# Patient Record
Sex: Female | Born: 1955
Health system: Southern US, Community
[De-identification: ages and names within clinical notes are randomized; demographics above are authoritative.]

## PROBLEM LIST (undated history)

## (undated) DIAGNOSIS — J449 Chronic obstructive pulmonary disease, unspecified: Secondary | ICD-10-CM

## (undated) DIAGNOSIS — E039 Hypothyroidism, unspecified: Secondary | ICD-10-CM

## (undated) DIAGNOSIS — E785 Hyperlipidemia, unspecified: Secondary | ICD-10-CM

## (undated) DIAGNOSIS — C50912 Malignant neoplasm of unspecified site of left female breast: Secondary | ICD-10-CM

## (undated) DIAGNOSIS — F329 Major depressive disorder, single episode, unspecified: Secondary | ICD-10-CM

## (undated) DIAGNOSIS — K219 Gastro-esophageal reflux disease without esophagitis: Secondary | ICD-10-CM

## (undated) DIAGNOSIS — Z801 Family history of malignant neoplasm of trachea, bronchus and lung: Secondary | ICD-10-CM

## (undated) DIAGNOSIS — I2089 Other forms of angina pectoris: Secondary | ICD-10-CM

## (undated) DIAGNOSIS — C801 Malignant (primary) neoplasm, unspecified: Secondary | ICD-10-CM

## (undated) DIAGNOSIS — Z800 Family history of malignant neoplasm of digestive organs: Secondary | ICD-10-CM

## (undated) DIAGNOSIS — Z803 Family history of malignant neoplasm of breast: Secondary | ICD-10-CM

## (undated) DIAGNOSIS — F32A Depression, unspecified: Secondary | ICD-10-CM

## (undated) DIAGNOSIS — I10 Essential (primary) hypertension: Secondary | ICD-10-CM

## (undated) DIAGNOSIS — Z8042 Family history of malignant neoplasm of prostate: Secondary | ICD-10-CM

## (undated) DIAGNOSIS — I208 Other forms of angina pectoris: Secondary | ICD-10-CM

## (undated) DIAGNOSIS — F419 Anxiety disorder, unspecified: Secondary | ICD-10-CM

## (undated) HISTORY — PX: ADENOIDECTOMY: SUR15

## (undated) HISTORY — DX: Family history of malignant neoplasm of trachea, bronchus and lung: Z80.1

## (undated) HISTORY — DX: Hypothyroidism, unspecified: E03.9

## (undated) HISTORY — DX: Malignant (primary) neoplasm, unspecified: C80.1

## (undated) HISTORY — DX: Family history of malignant neoplasm of prostate: Z80.42

## (undated) HISTORY — PX: CARDIAC CATHETERIZATION: SHX172

## (undated) HISTORY — PX: TONSILLECTOMY: SUR1361

## (undated) HISTORY — DX: Chronic obstructive pulmonary disease, unspecified: J44.9

## (undated) HISTORY — DX: Family history of malignant neoplasm of digestive organs: Z80.0

## (undated) HISTORY — DX: Family history of malignant neoplasm of breast: Z80.3

## (undated) HISTORY — DX: Hyperlipidemia, unspecified: E78.5

---
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
## 1998-01-21 DIAGNOSIS — C801 Malignant (primary) neoplasm, unspecified: Secondary | ICD-10-CM

## 1998-01-21 HISTORY — PX: MASTECTOMY, PARTIAL: SHX709

## 1998-01-21 HISTORY — DX: Malignant (primary) neoplasm, unspecified: C80.1

## 2006-08-04 ENCOUNTER — Ambulatory Visit: Payer: Self-pay | Admitting: Oncology

## 2006-09-08 LAB — CBC WITH DIFFERENTIAL/PLATELET
BASO%: 1.5 % (ref 0.0–2.0)
Basophils Absolute: 0.1 10*3/uL (ref 0.0–0.1)
EOS%: 1.8 % (ref 0.0–7.0)
Eosinophils Absolute: 0.1 10*3/uL (ref 0.0–0.5)
HCT: 39.7 % (ref 34.8–46.6)
HGB: 14.1 g/dL (ref 11.6–15.9)
LYMPH%: 30.8 % (ref 14.0–48.0)
MCH: 31.1 pg (ref 26.0–34.0)
MCHC: 35.4 g/dL (ref 32.0–36.0)
MCV: 87.9 fL (ref 81.0–101.0)
MONO#: 0.5 10*3/uL (ref 0.1–0.9)
MONO%: 7.6 % (ref 0.0–13.0)
NEUT#: 3.5 10*3/uL (ref 1.5–6.5)
NEUT%: 58.3 % (ref 39.6–76.8)
Platelets: 240 10*3/uL (ref 145–400)
RBC: 4.52 10*6/uL (ref 3.70–5.32)
RDW: 12 % (ref 11.3–14.5)
WBC: 5.9 10*3/uL (ref 3.9–10.0)
lymph#: 1.8 10*3/uL (ref 0.9–3.3)

## 2006-09-08 LAB — COMPREHENSIVE METABOLIC PANEL
ALT: 16 U/L (ref 0–35)
AST: 17 U/L (ref 0–37)
Albumin: 4.6 g/dL (ref 3.5–5.2)
Alkaline Phosphatase: 80 U/L (ref 39–117)
BUN: 15 mg/dL (ref 6–23)
CO2: 24 mEq/L (ref 19–32)
Calcium: 9.4 mg/dL (ref 8.4–10.5)
Chloride: 107 mEq/L (ref 96–112)
Creatinine, Ser: 0.82 mg/dL (ref 0.40–1.20)
Glucose, Bld: 82 mg/dL (ref 70–99)
Potassium: 4.3 mEq/L (ref 3.5–5.3)
Sodium: 142 mEq/L (ref 135–145)
Total Bilirubin: 0.4 mg/dL (ref 0.3–1.2)
Total Protein: 7.1 g/dL (ref 6.0–8.3)

## 2006-09-08 LAB — CANCER ANTIGEN 27.29: CA 27.29: 24 U/mL (ref 0–39)

## 2006-10-07 ENCOUNTER — Ambulatory Visit: Payer: Self-pay | Admitting: Oncology

## 2007-10-01 ENCOUNTER — Ambulatory Visit: Payer: Self-pay | Admitting: Oncology

## 2010-04-17 ENCOUNTER — Other Ambulatory Visit (HOSPITAL_COMMUNITY)
Admission: RE | Admit: 2010-04-17 | Discharge: 2010-04-17 | Disposition: A | Discharge status: Home / Self Care | Payer: BC Managed Care – PPO | Source: Ambulatory Visit | Attending: Family Medicine | Admitting: Family Medicine

## 2010-04-17 DIAGNOSIS — Z124 Encounter for screening for malignant neoplasm of cervix: Secondary | ICD-10-CM | POA: Insufficient documentation

## 2013-09-09 ENCOUNTER — Ambulatory Visit
Admission: RE | Admit: 2013-09-09 | Discharge: 2013-09-09 | Disposition: A | Discharge status: Home / Self Care | Payer: BC Managed Care – PPO | Source: Ambulatory Visit | Attending: Family Medicine | Admitting: Family Medicine

## 2013-09-09 ENCOUNTER — Other Ambulatory Visit: Payer: Self-pay | Admitting: Family Medicine

## 2013-09-09 DIAGNOSIS — R0602 Shortness of breath: Secondary | ICD-10-CM

## 2013-09-10 ENCOUNTER — Telehealth: Payer: Self-pay | Admitting: Cardiovascular Disease

## 2013-09-13 NOTE — Telephone Encounter (Signed)
Closed encounter °

## 2013-09-15 ENCOUNTER — Ambulatory Visit (INDEPENDENT_AMBULATORY_CARE_PROVIDER_SITE_OTHER): Payer: BC Managed Care – PPO | Admitting: Cardiovascular Disease

## 2013-09-15 ENCOUNTER — Encounter: Payer: Self-pay | Admitting: Cardiovascular Disease

## 2013-09-15 ENCOUNTER — Ambulatory Visit: Payer: Self-pay | Admitting: Cardiovascular Disease

## 2013-09-15 VITALS — BP 122/66 | HR 55 | Resp 16 | Ht 63.5 in | Wt 148.9 lb

## 2013-09-15 DIAGNOSIS — R0609 Other forms of dyspnea: Secondary | ICD-10-CM

## 2013-09-15 DIAGNOSIS — E7800 Pure hypercholesterolemia, unspecified: Secondary | ICD-10-CM | POA: Insufficient documentation

## 2013-09-15 DIAGNOSIS — R0989 Other specified symptoms and signs involving the circulatory and respiratory systems: Secondary | ICD-10-CM

## 2013-09-15 DIAGNOSIS — R0602 Shortness of breath: Secondary | ICD-10-CM

## 2013-09-15 DIAGNOSIS — Z853 Personal history of malignant neoplasm of breast: Secondary | ICD-10-CM

## 2013-09-15 DIAGNOSIS — Z87891 Personal history of nicotine dependence: Secondary | ICD-10-CM

## 2013-09-15 DIAGNOSIS — R9431 Abnormal electrocardiogram [ECG] [EKG]: Secondary | ICD-10-CM

## 2013-09-15 DIAGNOSIS — R0600 Dyspnea, unspecified: Secondary | ICD-10-CM

## 2013-09-15 NOTE — Progress Notes (Signed)
Patient ID: Dorothy Frederick, female   DOB: Nov 20, 1955, 58 y.o.   MRN: [id number]      Reason for office visit Shortness of breath on exertion, abnormal EKG  Dorothy Frederick is a 58 year old woman without previously known cardiac illness. She was diagnosed with hypercholesterolemia in her 17s but only started taking cholesterol-lowering medications in March of this year. In 2000 she had partial left mastectomy for breast cancer followed by chest radiation therapy. She did not receive chemotherapy. She is considered free of disease. She has smoked at least a pack of cigarettes a day for 35 years. Several weeks ago she began experiencing shortness of breath and decided to quit smoking. Despite this her shortness of breath is worsening. She becomes short of breath walking on level ground more than 100 feet or so. She denies exertional chest discomfort, but occasionally has a "muscle spasm" in her sternal area that lasts for about 30-60 seconds. It is associated with emotional distress. She is fairly sedentary. She works as an Optometrist.   Allergies  Allergen Reactions  . Benadryl [Diphenhydramine Hcl (Sleep)]   . Sulfa Antibiotics     Current Outpatient Prescriptions  Medication Sig Dispense Refill  . atorvastatin (LIPITOR) 10 MG tablet Take 10 mg by mouth daily.      . Cholecalciferol (VITAMIN D PO) Take 1,000 mg by mouth 2 (two) times daily.       No current facility-administered medications for this visit.    Past Medical History  Diagnosis Date  . Hyperlipidemia   . Cancer     breast    No past surgical history on file.  Family History  Problem Relation Age of Onset  . Heart disease Mother   . Cancer Mother   . Hypertension Father   . Cancer Father   . Hypertension Sister   . Heart disease Sister   . Hypertension Brother   . Hyperlipidemia Maternal Grandmother   . Stroke Maternal Grandmother   . Cancer Maternal Grandfather   . Hypertension Brother     History   Social  History  . Marital Status: Divorced    Spouse Name: N/A    Number of Children: N/A  . Years of Education: N/A   Occupational History  . Not on file.   Social History Main Topics  . Smoking status: Former Smoker -- 1.00 packs/day for 38 years    Types: Cigarettes  . Smokeless tobacco: Not on file  . Alcohol Use: No  . Drug Use: Not on file  . Sexual Activity: Not on file   Other Topics Concern  . Not on file   Social History Narrative  . No narrative on file    Review of systems: The patient specifically denies orthopnea, paroxysmal nocturnal dyspnea, syncope, palpitations, focal neurological deficits, intermittent claudication, lower extremity edema, unexplained weight gain, cough, hemoptysis or wheezing.  The patient also denies abdominal pain, nausea, vomiting, dysphagia, diarrhea, constipation, polyuria, polydipsia, dysuria, hematuria, frequency, urgency, abnormal bleeding or bruising, fever, chills, unexpected weight changes, mood swings, change in skin or hair texture, change in voice quality, auditory or visual problems, allergic reactions or rashes, new musculoskeletal complaints other than usual "aches and pains".   PHYSICAL EXAM BP 122/66  Pulse 55  Resp 16  Ht 5' 3.5" (1.613 m)  Wt 148 lb 14.4 oz (67.541 kg)  BMI 25.96 kg/m2  General: Alert, oriented x3, no distress Head: no evidence of trauma, PERRL, EOMI, no exophtalmos or lid lag, no myxedema, no xanthelasma;  normal ears, nose and oropharynx Neck: normal jugular venous pulsations and no hepatojugular reflux; brisk carotid pulses without delay and no carotid bruits Chest: Diffusely diminished breath sounds, but otherwise clear to auscultation, no signs of consolidation by percussion or palpation, normal fremitus, symmetrical and full respiratory excursions Cardiovascular: normal position and quality of the apical impulse, regular rhythm, normal first and second heart sounds, no murmurs, rubs or gallops Abdomen:  no tenderness or distention, no masses by palpation, no abnormal pulsatility or arterial bruits, normal bowel sounds, no hepatosplenomegaly Extremities: no clubbing, cyanosis or edema; 2+ radial, ulnar and brachial pulses bilaterally; 2+ right femoral, posterior tibial and dorsalis pedis pulses; 2+ left femoral, posterior tibial and dorsalis pedis pulses; no subclavian or femoral bruits Neurological: grossly nonfocal   EKG: Normal sinus rhythm, T-wave inversion in leads V1 through V3. An identical pattern is seen on the tracing from Dr. Ival Bible office. There are no older electrocardiograms available for review Lipid Panel  09/07/2013 total cholesterol 203, triglycerides 185, HDL 56, calculated LDL of 110 Creatinine 0.98 AST     ASSESSMENT AND PLAN  Dorothy Frederick has functional class II exertional dyspnea, atypical chest pain and an abnormal electrocardiogram suggestive of ischemia in the territory of the LAD coronary artery. She has numerous coronary risk factors including postmenopausal state, Long-standing history of smoking, hypercholesterolemia and radiation to her left thorax. The pre-test probability of significant coronary disease is moderate to high. I have recommended that she have a treadmill nuclear perfusion study and an echocardiogram. She will follow up after these procedures are done. Probably needs more aggressive treatment of her elevated LDL cholesterol.   If her cardiac workup is negative, I would consider performing pulmonary function tests to confirm what is a clinical impression of pulmonary emphysema.  She is congratulated on smoking cessation.  No orders of the defined types were placed in this encounter.   Meds ordered this encounter  Medications  . atorvastatin (LIPITOR) 10 MG tablet    Sig: Take 10 mg by mouth daily.  . Cholecalciferol (VITAMIN D PO)    Sig: Take 1,000 mg by mouth 2 (two) times daily.    Holli Humbles, MD, [place] [phone] office [phone] pager

## 2013-09-15 NOTE — Patient Instructions (Signed)
Your physician has requested that you have an echocardiogram. Echocardiography is a painless test that uses sound waves to create images of your heart. It provides your doctor with information about the size and shape of your heart and how well your heart's chambers and valves are working. This procedure takes approximately one hour. There are no restrictions for this procedure.  Your physician has requested that you have en exercise stress myoview. For further information please visit [email or web address]. Please follow instruction sheet, as given.  Dr. Sallyanne Kuster recommends that you schedule a follow-up appointment in: after testing is complete.

## 2013-09-21 ENCOUNTER — Telehealth (HOSPITAL_COMMUNITY): Payer: Self-pay

## 2013-09-21 NOTE — Telephone Encounter (Signed)
Encounter complete. 

## 2013-09-23 ENCOUNTER — Ambulatory Visit (HOSPITAL_COMMUNITY)
Admission: RE | Admit: 2013-09-23 | Discharge: 2013-09-23 | Disposition: A | Discharge status: Home / Self Care | Payer: BC Managed Care – PPO | Source: Ambulatory Visit | Attending: Internal Medicine | Admitting: Internal Medicine

## 2013-09-23 DIAGNOSIS — R079 Chest pain, unspecified: Secondary | ICD-10-CM | POA: Diagnosis not present

## 2013-09-23 DIAGNOSIS — R0602 Shortness of breath: Secondary | ICD-10-CM | POA: Insufficient documentation

## 2013-09-23 DIAGNOSIS — R0989 Other specified symptoms and signs involving the circulatory and respiratory systems: Secondary | ICD-10-CM | POA: Insufficient documentation

## 2013-09-23 DIAGNOSIS — E785 Hyperlipidemia, unspecified: Secondary | ICD-10-CM | POA: Insufficient documentation

## 2013-09-23 DIAGNOSIS — F17200 Nicotine dependence, unspecified, uncomplicated: Secondary | ICD-10-CM | POA: Diagnosis not present

## 2013-09-23 DIAGNOSIS — I359 Nonrheumatic aortic valve disorder, unspecified: Secondary | ICD-10-CM

## 2013-09-23 DIAGNOSIS — R42 Dizziness and giddiness: Secondary | ICD-10-CM | POA: Diagnosis not present

## 2013-09-23 DIAGNOSIS — R0609 Other forms of dyspnea: Secondary | ICD-10-CM | POA: Diagnosis not present

## 2013-09-23 DIAGNOSIS — Z87891 Personal history of nicotine dependence: Secondary | ICD-10-CM | POA: Diagnosis not present

## 2013-09-23 DIAGNOSIS — R5383 Other fatigue: Secondary | ICD-10-CM

## 2013-09-23 DIAGNOSIS — R5381 Other malaise: Secondary | ICD-10-CM | POA: Diagnosis not present

## 2013-09-23 DIAGNOSIS — Z8249 Family history of ischemic heart disease and other diseases of the circulatory system: Secondary | ICD-10-CM | POA: Diagnosis not present

## 2013-09-23 DIAGNOSIS — R9431 Abnormal electrocardiogram [ECG] [EKG]: Secondary | ICD-10-CM | POA: Diagnosis not present

## 2013-09-23 MED ORDER — TECHNETIUM TC 99M SESTAMIBI GENERIC - CARDIOLITE
31.2000 | Freq: Once | INTRAVENOUS | Status: AC | PRN
  Administered 2013-09-23: 31.2 via INTRAVENOUS

## 2013-09-23 MED ORDER — TECHNETIUM TC 99M SESTAMIBI GENERIC - CARDIOLITE
10.9000 | Freq: Once | INTRAVENOUS | Status: AC | PRN
  Administered 2013-09-23: 10.9 via INTRAVENOUS

## 2013-09-23 NOTE — Procedures (Addendum)
[facility] [address] [place] [address] [id number]  Cardiology Nuclear Med Study  Dorothy Frederick is a 58 y.o. female     MRN : [id number]     DOB: 1955/09/04  Procedure Date: 09/23/2013  Nuclear Med Background Indication for Stress Test:  Evaluation for Ischemia and Abnormal EKG History:  No prior cardiac or respiratory history reported;No prior NUC MPI for comparison. Cardiac Risk Factors: Family History - CAD, History of Smoking, Lipids and Pt states she quit smoking 6 weeks ago.  Symptoms:  Chest Pain, Dizziness, DOE and Fatigue   Nuclear Pre-Procedure Caffeine/Decaff Intake:  7:00pm NPO After: 5:00am   IV Site: R Forearm  IV 0.9% NS with Angio Cath:  22g  Chest Size (in):  n/a IV Started by: Rolene Course, RN  Height: 5' 3.5" (1.613 m)  Cup Size: C;Pt has had a left partial mastectomy;Pt states she does have two titanium markers in left breast.Pt states markers are located at the nipple line and at the chest wall.  BMI:  Body mass index is 25.8 kg/(m^2). Weight:  148 lb (67.132 kg)   Tech Comments:  No sticks or BP in left arm.    Nuclear Med Study 1 or 2 day study: 1 day  Stress Test Type:  Stress  Order Authorizing Provider:  Sanda Klein, MD   Resting Radionuclide: Technetium 25m Sestamibi  Resting Radionuclide Dose: 10.9 mCi   Stress Radionuclide:  Technetium 1m Sestamibi  Stress Radionuclide Dose: 31.2 mCi           Stress Protocol Rest HR: 49 Stress HR:144  Rest BP: 121/80 Stress BP: 201/78  Exercise Time (min): 5:45 METS: 6.00   Predicted Max HR: 162 bpm % Max HR: 88.89 bpm Rate Pressure Product: [phone]  Dose of Adenosine (mg):  n/a Dose of Lexiscan: n/a mg  Dose of Atropine (mg): n/a Dose of Dobutamine: n/a mcg/kg/min (at max HR)  Stress Test Technologist: Mellody Memos, CCT Nuclear Technologist: Imagene Riches, CNMT   Rest Procedure:  Myocardial perfusion imaging was performed at rest 45  minutes following the intravenous administration of Technetium 68m Sestamibi. Stress Procedure:  The patient performed treadmill exercise using a Bruce  Protocol for 5 minutes 45 seconds.  The patient stopped due to doe, fatigue. Patient did complain of a squeezing sensation in the center of her chest but it resolved very quickly during recovery. There were no significant ST-T wave changes.  Technetium 58m Sestamibi was injected IV at peak exercise and myocardial perfusion imaging was performed after a brief delay.  Transient Ischemic Dilatation (Normal <1.22):  1.11  QGS EDV:  63 ml QGS ESV:  24 ml LV Ejection Fraction: 63%     Rest ECG: NSR with non-specific ST-T wave changes  Stress ECG: No significant change from baseline ECG  QPS Raw Data Images:  Normal; no motion artifact; normal heart/lung ratio. Stress Images:  There is decreased uptake in the anterior wall. and apex. The perfusion reduction is mild Rest Images:  Normal homogeneous uptake in all areas of the myocardium. Subtraction (SDS):  These findings are consistent with ischemia. LV Wall Motion:  NL LV Function; NL Wall Motion  Impression Exercise Capacity:  Fair exercise capacity. BP Response:  Blood pressure increased in early exercise, then fell Clinical Symptoms:  Typical chest pain. ECG Impression:  No significant ST segment change suggestive of ischemia. Comparison with Prior Nuclear Study: No images to compare   Overall Impression:  Intermediate risk stress nuclear study with suggestion of ischemia in the LAD artery distribution. The perfusion defect is mild in severity, but involves most of the LAD artery territory.Sanda Klein, MD  09/23/2013 12:14 PM

## 2013-09-23 NOTE — Progress Notes (Signed)
2D Echo Performed 09/23/2013    Marygrace Drought, RCS

## 2013-10-14 ENCOUNTER — Telehealth: Payer: Self-pay | Admitting: Cardiovascular Disease

## 2013-10-14 NOTE — Telephone Encounter (Signed)
Spoke with pt, aware of echo and nuclear results. She had to cx appt on Monday due to work. She will come tomorrow at 3:45p to discuss stress test

## 2013-10-14 NOTE — Telephone Encounter (Signed)
Dorothy Frederick is wanting to know the results of her echo and nuc tests.. Please call    Thanks

## 2013-10-15 ENCOUNTER — Encounter: Payer: Self-pay | Admitting: Cardiovascular Disease

## 2013-10-15 ENCOUNTER — Ambulatory Visit (INDEPENDENT_AMBULATORY_CARE_PROVIDER_SITE_OTHER): Payer: BC Managed Care – PPO | Admitting: Cardiovascular Disease

## 2013-10-15 VITALS — BP 128/69 | HR 60 | Resp 16 | Ht 63.5 in | Wt 152.0 lb

## 2013-10-15 DIAGNOSIS — E7800 Pure hypercholesterolemia, unspecified: Secondary | ICD-10-CM

## 2013-10-15 DIAGNOSIS — Z87891 Personal history of nicotine dependence: Secondary | ICD-10-CM

## 2013-10-15 DIAGNOSIS — R0600 Dyspnea, unspecified: Secondary | ICD-10-CM

## 2013-10-15 DIAGNOSIS — D689 Coagulation defect, unspecified: Secondary | ICD-10-CM

## 2013-10-15 DIAGNOSIS — R5383 Other fatigue: Secondary | ICD-10-CM

## 2013-10-15 DIAGNOSIS — R5381 Other malaise: Secondary | ICD-10-CM

## 2013-10-15 DIAGNOSIS — R9431 Abnormal electrocardiogram [ECG] [EKG]: Secondary | ICD-10-CM

## 2013-10-15 DIAGNOSIS — Z853 Personal history of malignant neoplasm of breast: Secondary | ICD-10-CM

## 2013-10-15 DIAGNOSIS — R9439 Abnormal result of other cardiovascular function study: Secondary | ICD-10-CM

## 2013-10-15 DIAGNOSIS — R0989 Other specified symptoms and signs involving the circulatory and respiratory systems: Secondary | ICD-10-CM

## 2013-10-15 DIAGNOSIS — R0609 Other forms of dyspnea: Secondary | ICD-10-CM

## 2013-10-15 DIAGNOSIS — Z79899 Other long term (current) drug therapy: Secondary | ICD-10-CM

## 2013-10-16 ENCOUNTER — Encounter: Payer: Self-pay | Admitting: Cardiovascular Disease

## 2013-10-16 DIAGNOSIS — R9439 Abnormal result of other cardiovascular function study: Secondary | ICD-10-CM | POA: Insufficient documentation

## 2013-10-16 NOTE — Patient Instructions (Signed)
Your physician has recommended you make the following change in your medication: start Aspirin 81 mg daily. Your physician has requested that you have a cardiac catheterization. Cardiac catheterization is used to diagnose and/or treat various heart conditions. Doctors may recommend this procedure for a number of different reasons. The most common reason is to evaluate chest pain. Chest pain can be a symptom of coronary artery disease (CAD), and cardiac catheterization can show whether plaque is narrowing or blocking your heart's arteries. This procedure is also used to evaluate the valves, as well as measure the blood flow and oxygen levels in different parts of your heart. For further information please visit [email or web address]. Please follow instruction sheet, as given.

## 2013-10-16 NOTE — Progress Notes (Signed)
Patient ID: Dorothy Frederick, female   DOB: 29-Nov-1955, 58 y.o.   MRN: [id number]     Reason for office visit Abnormal nuclear stress test  Dorothy Frederick is a 58 year old woman without previously known cardiac illness. She was diagnosed with hypercholesterolemia in her 71s but only started taking cholesterol-lowering medications in March of this year. In 2000 she had partial left mastectomy for breast cancer followed by chest radiation therapy. She did not receive chemotherapy. She is considered free of disease. She has smoked at least a pack of cigarettes a day for 35 years. Several weeks ago she began experiencing shortness of breath and decided to quit smoking. Despite this her shortness of breath is worsening. She becomes short of breath walking on level ground more than 100 feet or so. She denies exertional chest discomfort, but occasionally has a "muscle spasm" in her sternal area that lasts for about 30-60 seconds. It is associated with emotional distress. She is fairly sedentary. She works as an Optometrist.  Her echo was normal, but the nuclear stress test showed anterior ischemia.The defect was relatively mild, but extensive (the entire LAD artery distribution).   Allergies  Allergen Reactions  . Benadryl [Diphenhydramine Hcl (Sleep)]   . Sulfa Antibiotics     Current Outpatient Prescriptions  Medication Sig Dispense Refill  . atorvastatin (LIPITOR) 10 MG tablet Take 10 mg by mouth daily.      . Cholecalciferol (VITAMIN D PO) Take 1,000 mg by mouth 2 (two) times daily.       No current facility-administered medications for this visit.    Past Medical History  Diagnosis Date  . Hyperlipidemia   . Cancer     breast    Past Surgical History  Procedure Laterality Date  . Mastectomy, partial Left 2000    Family History  Problem Relation Age of Onset  . Heart disease Mother   . Cancer Mother   . Hypertension Father   . Cancer Father   . Hypertension Sister   . Heart  disease Sister   . Hypertension Brother   . Hyperlipidemia Maternal Grandmother   . Stroke Maternal Grandmother   . Cancer Maternal Grandfather   . Hypertension Brother     History   Social History  . Marital Status: Divorced    Spouse Name: N/A    Number of Children: N/A  . Years of Education: N/A   Occupational History  . Not on file.   Social History Main Topics  . Smoking status: Former Smoker -- 1.00 packs/day for 38 years    Types: Cigarettes  . Smokeless tobacco: Not on file  . Alcohol Use: No  . Drug Use: Not on file  . Sexual Activity: Not on file   Other Topics Concern  . Not on file   Social History Narrative  . No narrative on file    Review of systems: The patient specifically denies orthopnea, paroxysmal nocturnal dyspnea, syncope, palpitations, focal neurological deficits, intermittent claudication, lower extremity edema, unexplained weight gain, cough, hemoptysis or wheezing.  The patient also denies abdominal pain, nausea, vomiting, dysphagia, diarrhea, constipation, polyuria, polydipsia, dysuria, hematuria, frequency, urgency, abnormal bleeding or bruising, fever, chills, unexpected weight changes, mood swings, change in skin or hair texture, change in voice quality, auditory or visual problems, allergic reactions or rashes, new musculoskeletal complaints other than usual "aches and pains".   PHYSICAL EXAM BP 128/69  Pulse 60  Resp 16  Ht 5' 3.5" (1.613 m)  Wt 68.947 kg (152 lb)  BMI 26.50 kg/m2 General: Alert, oriented x3, no distress  Head: no evidence of trauma, PERRL, EOMI, no exophtalmos or lid lag, no myxedema, no xanthelasma; normal ears, nose and oropharynx  Neck: normal jugular venous pulsations and no hepatojugular reflux; brisk carotid pulses without delay and no carotid bruits  Chest: Diffusely diminished breath sounds, but otherwise clear to auscultation, no signs of consolidation by percussion or palpation, normal fremitus, symmetrical  and full respiratory excursions  Cardiovascular: normal position and quality of the apical impulse, regular rhythm, normal first and second heart sounds, no murmurs, rubs or gallops  Abdomen: no tenderness or distention, no masses by palpation, no abnormal pulsatility or arterial bruits, normal bowel sounds, no hepatosplenomegaly  Extremities: no clubbing, cyanosis or edema; 2+ radial, ulnar and brachial pulses bilaterally; 2+ right femoral, posterior tibial and dorsalis pedis pulses; 2+ left femoral, posterior tibial and dorsalis pedis pulses; no subclavian or femoral bruits  Neurological: grossly nonfocal   EKG: Normal sinus rhythm, T-wave inversion in leads V1 through V3. An identical pattern is seen on the tracing from Dr. Ival Bible office. There are no older electrocardiograms available for review  Lipid Panel  09/07/2013 total cholesterol 203, triglycerides 185, HDL 56, calculated LDL of 110 Creat 0.98  ASSESSMENT AND PLAN Dorothy Frederick has symptoms, ECG changes and nuclear perfusion defects that suggest proximal LAD stenosis (stable angina pectoris CCS 2) and is at increased risk for LAD artery disease secondary to chest XRT.  I recommended that she undergo cardiac cath and coronary angiography, possibly angioplasty/stent. This procedure has been fully reviewed with the patient and informed consent has been obtained.  In the meantime she should start taking ASA 81 mg daily and is cautioned to seek urgent medical attention for chest symptoms that persist for >25-30' at rest.  Orders Placed This Encounter  Procedures  . CBC  . Basic metabolic panel  . Protime-INR  . APTT  . LEFT HEART CATHETERIZATION WITH CORONARY ANGIOGRAM   No orders of the defined types were placed in this encounter.    Holli Humbles, MD, [place] [phone] office [phone] pager

## 2013-10-18 ENCOUNTER — Encounter (HOSPITAL_COMMUNITY): Payer: Self-pay

## 2013-10-18 ENCOUNTER — Ambulatory Visit: Payer: BC Managed Care – PPO | Admitting: Cardiovascular Disease

## 2013-10-18 NOTE — Progress Notes (Signed)
Discussed at [place] 10/15/13

## 2013-10-19 ENCOUNTER — Other Ambulatory Visit: Payer: Self-pay | Admitting: *Deleted

## 2013-10-19 DIAGNOSIS — R9439 Abnormal result of other cardiovascular function study: Secondary | ICD-10-CM

## 2013-10-19 LAB — BASIC METABOLIC PANEL
BUN: 10 mg/dL (ref 6–23)
CO2: 28 mEq/L (ref 19–32)
Calcium: 9.7 mg/dL (ref 8.4–10.5)
Chloride: 103 mEq/L (ref 96–112)
Creat: 0.97 mg/dL (ref 0.50–1.10)
Glucose, Bld: 90 mg/dL (ref 70–99)
Potassium: 3.9 mEq/L (ref 3.5–5.3)
Sodium: 140 mEq/L (ref 135–145)

## 2013-10-19 LAB — CBC
HCT: 40.1 % (ref 36.0–46.0)
Hemoglobin: 13.3 g/dL (ref 12.0–15.0)
MCH: 29.4 pg (ref 26.0–34.0)
MCHC: 33.2 g/dL (ref 30.0–36.0)
MCV: 88.7 fL (ref 78.0–100.0)
Platelets: 262 10*3/uL (ref 150–400)
RBC: 4.52 MIL/uL (ref 3.87–5.11)
RDW: 13.8 % (ref 11.5–15.5)
WBC: 5.4 10*3/uL (ref 4.0–10.5)

## 2013-10-19 LAB — PROTIME-INR
INR: 0.89 (ref <1.50)
Prothrombin Time: 12.1 seconds (ref 11.6–15.2)

## 2013-10-19 LAB — APTT: aPTT: 40 seconds — ABNORMAL HIGH (ref 24–37)

## 2013-10-21 ENCOUNTER — Encounter (HOSPITAL_COMMUNITY)
Admission: RE | Disposition: A | Discharge status: Home / Self Care | Payer: Self-pay | Source: Ambulatory Visit | Attending: Cardiovascular Disease

## 2013-10-21 ENCOUNTER — Ambulatory Visit (HOSPITAL_COMMUNITY)
Admission: RE | Admit: 2013-10-21 | Discharge: 2013-10-21 | Disposition: A | Discharge status: Home / Self Care | Payer: BC Managed Care – PPO | Source: Ambulatory Visit | Attending: Cardiovascular Disease | Admitting: Cardiovascular Disease

## 2013-10-21 DIAGNOSIS — E780 Pure hypercholesterolemia: Secondary | ICD-10-CM | POA: Diagnosis not present

## 2013-10-21 DIAGNOSIS — Z9012 Acquired absence of left breast and nipple: Secondary | ICD-10-CM | POA: Diagnosis not present

## 2013-10-21 DIAGNOSIS — Z87891 Personal history of nicotine dependence: Secondary | ICD-10-CM | POA: Diagnosis not present

## 2013-10-21 DIAGNOSIS — Z923 Personal history of irradiation: Secondary | ICD-10-CM | POA: Insufficient documentation

## 2013-10-21 DIAGNOSIS — Z853 Personal history of malignant neoplasm of breast: Secondary | ICD-10-CM | POA: Diagnosis not present

## 2013-10-21 DIAGNOSIS — I25118 Atherosclerotic heart disease of native coronary artery with other forms of angina pectoris: Secondary | ICD-10-CM | POA: Insufficient documentation

## 2013-10-21 DIAGNOSIS — R9439 Abnormal result of other cardiovascular function study: Secondary | ICD-10-CM

## 2013-10-21 HISTORY — PX: LEFT HEART CATHETERIZATION WITH CORONARY ANGIOGRAM: SHX5451

## 2013-10-21 LAB — CK TOTAL AND CKMB (NOT AT ARMC)
CK, MB: <1 ng/mL (ref 0.3–4.0)
Total CK: 71 U/L (ref 7–177)

## 2013-10-21 SURGERY — LEFT HEART CATHETERIZATION WITH CORONARY ANGIOGRAM
Anesthesia: LOCAL

## 2013-10-21 MED ORDER — NITROGLYCERIN 1 MG/10 ML FOR IR/CATH LAB
INTRA_ARTERIAL | Status: AC
  Filled 2013-10-21: qty 10

## 2013-10-21 MED ORDER — SODIUM CHLORIDE 0.9 % IV SOLN
INTRAVENOUS | Status: AC
  Administered 2013-10-21: 12:00:00 via INTRAVENOUS

## 2013-10-21 MED ORDER — VERAPAMIL HCL 2.5 MG/ML IV SOLN
INTRAVENOUS | Status: AC
  Filled 2013-10-21: qty 2

## 2013-10-21 MED ORDER — SODIUM CHLORIDE 0.9 % IV SOLN
INTRAVENOUS | Status: DC
  Administered 2013-10-21: 10:00:00 via INTRAVENOUS

## 2013-10-21 MED ORDER — MIDAZOLAM HCL 2 MG/2ML IJ SOLN
INTRAMUSCULAR | Status: AC
  Filled 2013-10-21: qty 2

## 2013-10-21 MED ORDER — LIDOCAINE HCL (PF) 1 % IJ SOLN
INTRAMUSCULAR | Status: AC
  Filled 2013-10-21: qty 30

## 2013-10-21 MED ORDER — HEPARIN SODIUM (PORCINE) 1000 UNIT/ML IJ SOLN
INTRAMUSCULAR | Status: AC
  Filled 2013-10-21: qty 1

## 2013-10-21 MED ORDER — ASPIRIN 81 MG PO CHEW
CHEWABLE_TABLET | ORAL | Status: AC
  Filled 2013-10-21: qty 1

## 2013-10-21 MED ORDER — FENTANYL CITRATE 0.05 MG/ML IJ SOLN
INTRAMUSCULAR | Status: AC
  Filled 2013-10-21: qty 2

## 2013-10-21 MED ORDER — ASPIRIN 81 MG PO CHEW
81.0000 mg | CHEWABLE_TABLET | ORAL | Status: AC
  Administered 2013-10-21: 81 mg via ORAL

## 2013-10-21 MED ORDER — HEPARIN (PORCINE) IN NACL 2-0.9 UNIT/ML-% IJ SOLN
INTRAMUSCULAR | Status: AC
  Filled 2013-10-21: qty 1000

## 2013-10-21 MED ORDER — SODIUM CHLORIDE 0.9 % IJ SOLN: 3.0000 mL | INTRAMUSCULAR | Status: DC | PRN

## 2013-10-21 NOTE — Research (Signed)
Biolfow Study Informed Consent   Subject Name: Dorothy Frederick  Subject met inclusion and exclusion criteria.  The informed consent form, study requirements and expectations were reviewed with the subject and questions and concerns were addressed prior to the signing of the consent form.  The subject verbalized understanding of the trail requirements.  The subject agreed to participate in the Bioflow trial and signed the informed consent.  The informed consent was obtained prior to performance of any protocol-specific procedures for the subject.  A copy of the signed informed consent was given to the subject and a copy was placed in the subject's medical record.  Sandie Ano 10/21/2013, 8:50

## 2013-10-21 NOTE — Progress Notes (Signed)
Eating turkey sandwich 

## 2013-10-21 NOTE — Interval H&P Note (Signed)
History and Physical Interval Note:  10/21/2013 10:31 AM  Dorothy Frederick  has presented today for cardiac cath with the diagnosis of dyspnea c/w class III angina, abnormal nuclear stress test  The various methods of treatment have been discussed with the patient and family. After consideration of risks, benefits and other options for treatment, the patient has consented to  Procedure(s): LEFT HEART CATHETERIZATION WITH CORONARY ANGIOGRAM (N/A) as a surgical intervention .  The patient's history has been reviewed, patient examined, no change in status, stable for surgery.  I have reviewed the patient's chart and labs.  Questions were answered to the patient's satisfaction.    Cath Lab Visit (complete for each Cath Lab visit)  Clinical Evaluation Leading to the Procedure:   ACS: No.  Non-ACS:    Anginal Classification: CCS III  Anti-ischemic medical therapy: No Therapy  Non-Invasive Test Results: Intermediate-risk stress test findings: cardiac mortality 1-3%/year  Prior CABG: No previous CABG         MCALHANY,CHRISTOPHER

## 2013-10-21 NOTE — Progress Notes (Signed)
Removed 3cc from TR band; site started to ooze. 2cc reinserted.

## 2013-10-21 NOTE — Discharge Instructions (Signed)
Radial Site Care °Refer to this sheet in the next few weeks. These instructions provide you with information on caring for yourself after your procedure. Your caregiver may also give you more specific instructions. Your treatment has been planned according to current medical practices, but problems sometimes occur. Call your caregiver if you have any problems or questions after your procedure. °HOME CARE INSTRUCTIONS °· You may shower the day after the procedure. Remove the bandage (dressing) and gently wash the site with plain soap and water. Gently pat the site dry. °· Do not apply powder or lotion to the site. °· Do not submerge the affected site in water for 3 to 5 days. °· Inspect the site at least twice daily. °· Do not flex or bend the affected arm for 24 hours. °· No lifting over 5 pounds (2.3 kg) for 5 days after your procedure. °· Do not drive home if you are discharged the same day of the procedure. Have someone else drive you. °· You may drive 24 hours after the procedure unless otherwise instructed by your caregiver. °· Do not operate machinery or power tools for 24 hours. °· A responsible adult should be with you for the first 24 hours after you arrive home. °What to expect: °· Any bruising will usually fade within 1 to 2 weeks. °· Blood that collects in the tissue (hematoma) may be painful to the touch. It should usually decrease in size and tenderness within 1 to 2 weeks. °SEEK IMMEDIATE MEDICAL CARE IF: °· You have unusual pain at the radial site. °· You have redness, warmth, swelling, or pain at the radial site. °· You have drainage (other than a small amount of blood on the dressing). °· You have chills. °· You have a fever or persistent symptoms for more than 72 hours. °· You have a fever and your symptoms suddenly get worse. °· Your arm becomes pale, cool, tingly, or numb. °· You have heavy bleeding from the site. Hold pressure on the site. °Document Released: 02/09/2010 Document Revised:  04/01/2011 Document Reviewed: 02/09/2010 °Exit[employer]® Patient Information ©2015 [employer]. This information is not intended to replace advice given to you by your health care provider. Make sure you discuss any questions you have with your health care provider. ° °

## 2013-10-21 NOTE — CV Procedure (Signed)
      Cardiac Catheterization Operative Report  Zahli Summitt [id number] 10/1/201511:23 AM DEWEY,ELIZABETH, MD  Procedure Performed:  1. Left Heart Catheterization 2. Selective Coronary Angiography 3. Left ventricular angiogram  Operator: Lauree Chandler, MD  Arterial access site:  Right radial artery.   Indication: 59 yo female with history of tobacco abuse, HLD, breast cancer s/p XRT with recent dyspnea concerning for unstable angina. Stress myoview with possible anterior wall ischemia.                                       Procedure Details: The risks, benefits, complications, treatment options, and expected outcomes were discussed with the patient. The patient and/or family concurred with the proposed plan, giving informed consent. The patient was brought to the cath lab after IV hydration was begun and oral premedication was given. The patient was further sedated with Versed and Fentanyl. The right wrist was assessed with a reverse Allens test which was positive. The right wrist was prepped and draped in a sterile fashion. 1% lidocaine was used for local anesthesia. Using the modified Seldinger access technique, a 5 French sheath was placed in the right radial artery. 3 mg Verapamil was given through the sheath. 3500 units IV heparin was given. Standard diagnostic catheters were used to perform selective coronary angiography. A pigtail catheter was used to perform a left ventricular angiogram. The sheath was removed from the right radial artery and a Terumo hemostasis band was applied at the arteriotomy site on the right wrist.    There were no immediate complications. The patient was taken to the recovery area in stable condition.   Hemodynamic Findings: Central aortic pressure: 154/69 Left ventricular pressure: 156/2/5  Angiographic Findings:  Left main: No obstructive disease.   Left Anterior Descending Artery: Large caliber vessel that courses to the apex. There are  luminal irregularities in the proximal and mid vessel. The distal vessel has a small area that may represent a myocardial bridge. Small caliber diagonal branch with no obstructive disease.   Circumflex Artery: Large caliber dominant vessel with moderate caliber obtuse marginal branch and several small caliber posterolateral branches. The OM branch has 10-20% proximal stenosis.   Right Coronary Artery: Small non-dominant vessel with no obstructive disease.   Left Ventricular Angiogram: LVEF=65-70%. No significant MR noted.   Impression: 1. Mild non-obstructive CAD 2. Normal LV systolic funtion 3. Normal LV pressures  Recommendations: Medical management of CAD.        Complications:  None. The patient tolerated the procedure well.

## 2013-10-21 NOTE — H&P (View-Only) (Signed)
Patient ID: Dorothy Frederick, female   DOB: 18-Jan-1956, 58 y.o.   MRN: [id number]     Reason for office visit Abnormal nuclear stress test  Mrs. Mouse is a 58 year old woman without previously known cardiac illness. She was diagnosed with hypercholesterolemia in her 21s but only started taking cholesterol-lowering medications in March of this year. In 2000 she had partial left mastectomy for breast cancer followed by chest radiation therapy. She did not receive chemotherapy. She is considered free of disease. She has smoked at least a pack of cigarettes a day for 35 years. Several weeks ago she began experiencing shortness of breath and decided to quit smoking. Despite this her shortness of breath is worsening. She becomes short of breath walking on level ground more than 100 feet or so. She denies exertional chest discomfort, but occasionally has a "muscle spasm" in her sternal area that lasts for about 30-60 seconds. It is associated with emotional distress. She is fairly sedentary. She works as an Optometrist.  Her echo was normal, but the nuclear stress test showed anterior ischemia.The defect was relatively mild, but extensive (the entire LAD artery distribution).   Allergies  Allergen Reactions  . Benadryl [Diphenhydramine Hcl (Sleep)]   . Sulfa Antibiotics     Current Outpatient Prescriptions  Medication Sig Dispense Refill  . atorvastatin (LIPITOR) 10 MG tablet Take 10 mg by mouth daily.      . Cholecalciferol (VITAMIN D PO) Take 1,000 mg by mouth 2 (two) times daily.       No current facility-administered medications for this visit.    Past Medical History  Diagnosis Date  . Hyperlipidemia   . Cancer     breast    Past Surgical History  Procedure Laterality Date  . Mastectomy, partial Left 2000    Family History  Problem Relation Age of Onset  . Heart disease Mother   . Cancer Mother   . Hypertension Father   . Cancer Father   . Hypertension Sister   . Heart  disease Sister   . Hypertension Brother   . Hyperlipidemia Maternal Grandmother   . Stroke Maternal Grandmother   . Cancer Maternal Grandfather   . Hypertension Brother     History   Social History  . Marital Status: Divorced    Spouse Name: N/A    Number of Children: N/A  . Years of Education: N/A   Occupational History  . Not on file.   Social History Main Topics  . Smoking status: Former Smoker -- 1.00 packs/day for 38 years    Types: Cigarettes  . Smokeless tobacco: Not on file  . Alcohol Use: No  . Drug Use: Not on file  . Sexual Activity: Not on file   Other Topics Concern  . Not on file   Social History Narrative  . No narrative on file    Review of systems: The patient specifically denies orthopnea, paroxysmal nocturnal dyspnea, syncope, palpitations, focal neurological deficits, intermittent claudication, lower extremity edema, unexplained weight gain, cough, hemoptysis or wheezing.  The patient also denies abdominal pain, nausea, vomiting, dysphagia, diarrhea, constipation, polyuria, polydipsia, dysuria, hematuria, frequency, urgency, abnormal bleeding or bruising, fever, chills, unexpected weight changes, mood swings, change in skin or hair texture, change in voice quality, auditory or visual problems, allergic reactions or rashes, new musculoskeletal complaints other than usual "aches and pains".   PHYSICAL EXAM BP 128/69  Pulse 60  Resp 16  Ht 5' 3.5" (1.613 m)  Wt 68.947 kg (152 lb)  BMI 26.50 kg/m2 General: Alert, oriented x3, no distress  Head: no evidence of trauma, PERRL, EOMI, no exophtalmos or lid lag, no myxedema, no xanthelasma; normal ears, nose and oropharynx  Neck: normal jugular venous pulsations and no hepatojugular reflux; brisk carotid pulses without delay and no carotid bruits  Chest: Diffusely diminished breath sounds, but otherwise clear to auscultation, no signs of consolidation by percussion or palpation, normal fremitus, symmetrical  and full respiratory excursions  Cardiovascular: normal position and quality of the apical impulse, regular rhythm, normal first and second heart sounds, no murmurs, rubs or gallops  Abdomen: no tenderness or distention, no masses by palpation, no abnormal pulsatility or arterial bruits, normal bowel sounds, no hepatosplenomegaly  Extremities: no clubbing, cyanosis or edema; 2+ radial, ulnar and brachial pulses bilaterally; 2+ right femoral, posterior tibial and dorsalis pedis pulses; 2+ left femoral, posterior tibial and dorsalis pedis pulses; no subclavian or femoral bruits  Neurological: grossly nonfocal   EKG: Normal sinus rhythm, T-wave inversion in leads V1 through V3. An identical pattern is seen on the tracing from Dr. Ival Bible office. There are no older electrocardiograms available for review  Lipid Panel  09/07/2013 total cholesterol 203, triglycerides 185, HDL 56, calculated LDL of 110 Creat 0.98  ASSESSMENT AND PLAN Mrs. Jeanlouis has symptoms, ECG changes and nuclear perfusion defects that suggest proximal LAD stenosis (stable angina pectoris CCS 2) and is at increased risk for LAD artery disease secondary to chest XRT.  I recommended that she undergo cardiac cath and coronary angiography, possibly angioplasty/stent. This procedure has been fully reviewed with the patient and informed consent has been obtained.  In the meantime she should start taking ASA 81 mg daily and is cautioned to seek urgent medical attention for chest symptoms that persist for >25-30' at rest.  Orders Placed This Encounter  Procedures  . CBC  . Basic metabolic panel  . Protime-INR  . APTT  . LEFT HEART CATHETERIZATION WITH CORONARY ANGIOGRAM   No orders of the defined types were placed in this encounter.    Holli Humbles, MD, [place] [phone] office [phone] pager

## 2013-11-05 ENCOUNTER — Ambulatory Visit (INDEPENDENT_AMBULATORY_CARE_PROVIDER_SITE_OTHER): Payer: BC Managed Care – PPO | Admitting: Cardiology

## 2013-11-05 ENCOUNTER — Encounter: Payer: Self-pay | Admitting: Cardiology

## 2013-11-05 VITALS — BP 140/76 | HR 64 | Ht 63.5 in | Wt 153.4 lb

## 2013-11-05 DIAGNOSIS — Z9889 Other specified postprocedural states: Secondary | ICD-10-CM

## 2013-11-05 DIAGNOSIS — Z87891 Personal history of nicotine dependence: Secondary | ICD-10-CM

## 2013-11-05 DIAGNOSIS — I10 Essential (primary) hypertension: Secondary | ICD-10-CM

## 2013-11-05 MED ORDER — AMLODIPINE BESYLATE 5 MG PO TABS: 5.0000 mg | ORAL_TABLET | Freq: Every day | ORAL | Status: DC

## 2013-11-05 NOTE — Patient Instructions (Signed)
Your physician has recommended you make the following change in your medication: start new prescription for amlodipine this has already been sent to your pharmacy.  Your physician wants you to follow-up in: 6 months or sooner if needed with Dr. Sallyanne Kuster. You will receive a reminder letter in the mail two months in advance. If you don't receive a letter, please call our office to schedule the follow-up appointment.

## 2013-11-05 NOTE — Progress Notes (Signed)
11/08/2013   PCP: Rachell Cipro, MD   Chief Complaint  Patient presents with  . Follow-up    post cath    Primary Cardiologist:Dr. Bertrum Sol   HPI:  58 year old woman without previously known cardiac illness. She was diagnosed with hypercholesterolemia in her 77s but only started taking cholesterol-lowering medications in March of this year. In 2000 she had partial left mastectomy for breast cancer followed by chest radiation therapy. She did not receive chemotherapy. She is considered free of disease. She has smoked at least a pack of cigarettes a day for 35 years. Several weeks ago she began experiencing shortness of breath and decided to quit smoking. Despite this her shortness of breath is worsening. She becomes short of breath walking on level ground more than 100 feet or so. She denies exertional chest discomfort, but occasionally has a "muscle spasm" in her sternal area that lasts for about 30-60 seconds. It is associated with emotional distress. She is fairly sedentary. She works as an Optometrist.   Her echo was normal, but the nuclear stress test showed anterior ischemia. The defect was relatively mild, but extensive (the entire LAD artery distribution).   cardiac catheterization was arranged which she underwent on October 1 of this year. She had mild nonobstructive disease 10-20% who in stenosis. EF 65-70% no significant MR noted. Medical management of the coronary disease.    She is back today for followup.  No chest pain no shortness of breath no significant complaints today.      Allergies  Allergen Reactions  . Benadryl [Diphenhydramine Hcl (Sleep)] Palpitations    Increased heartrate  . Sulfa Antibiotics Rash    Current Outpatient Prescriptions  Medication Sig Dispense Refill  . atorvastatin (LIPITOR) 10 MG tablet Take 10 mg by mouth daily.      . Cholecalciferol 1000 UNITS capsule Take 1,000 Units by mouth 2 (two) times daily.      Marland Kitchen  amLODipine (NORVASC) 5 MG tablet Take 1 tablet (5 mg total) by mouth daily.  30 tablet  6   No current facility-administered medications for this visit.    Past Medical History  Diagnosis Date  . Hyperlipidemia   . Cancer     breast    Past Surgical History  Procedure Laterality Date  . Mastectomy, partial Left 2000    [id number] colds or fevers, no weight changes Skin:no rashes or ulcers HEENT:no blurred vision, no congestion CV:see HPI PUL:see HPI GI:no diarrhea constipation or melena, no indigestion GU:no hematuria, no dysuria MS:no joint pain, no claudication Neuro:no syncope, no lightheadedness Endo:no diabetes, no thyroid disease  Wt Readings from Last 3 Encounters:  11/05/13 153 lb 6.4 oz (69.582 kg)  10/21/13 149 lb (67.586 kg)  10/21/13 149 lb (67.586 kg)    PHYSICAL EXAM BP 140/76  Pulse 64  Ht 5' 3.5" (1.613 m)  Wt 153 lb 6.4 oz (69.582 kg)  BMI 26.74 kg/m2 General:Pleasant affect, NAD Skin:Warm and dry, brisk capillary refill HEENT:normocephalic, sclera clear, mucus membranes moist Neck:supple, no JVD, no bruits  Heart:S1S2 RRR without murmur, gallup, rub or click Lungs:clear without rales, rhonchi, or wheezes [id number], non tender, + BS, do not palpate liver spleen or masses Ext:no lower ext edema, 2+ pedal pulses, 2+ radial  Cath site stable.  Neuro:alert and oriented, MAE, follows commands, + facial symmetry   ASSESSMENT AND PLAN HTN (hypertension) Added norvasc 5 mg daily   S/P cardiac cath Non obstructive CAD  History of  tobacco use Has stopped in July

## 2013-11-08 DIAGNOSIS — Z9889 Other specified postprocedural states: Secondary | ICD-10-CM | POA: Insufficient documentation

## 2013-11-08 DIAGNOSIS — I10 Essential (primary) hypertension: Secondary | ICD-10-CM | POA: Insufficient documentation

## 2013-11-08 NOTE — Assessment & Plan Note (Signed)
Added norvasc 5 mg daily

## 2013-11-08 NOTE — Assessment & Plan Note (Signed)
Has stopped in July

## 2013-11-08 NOTE — Assessment & Plan Note (Signed)
Non obstructive CAD

## 2013-11-24 ENCOUNTER — Ambulatory Visit: Payer: BC Managed Care – PPO | Admitting: Cardiovascular Disease

## 2013-12-30 ENCOUNTER — Encounter (HOSPITAL_COMMUNITY): Payer: Self-pay | Admitting: Cardiovascular Disease

## 2014-03-10 ENCOUNTER — Emergency Department (HOSPITAL_COMMUNITY): Payer: BLUE CROSS/BLUE SHIELD

## 2014-03-10 ENCOUNTER — Emergency Department (HOSPITAL_COMMUNITY)
Admission: EM | Admit: 2014-03-10 | Discharge: 2014-03-10 | Disposition: A | Discharge status: Home / Self Care | Payer: BLUE CROSS/BLUE SHIELD | Attending: Emergency Medicine | Admitting: Emergency Medicine

## 2014-03-10 ENCOUNTER — Encounter (HOSPITAL_COMMUNITY): Payer: Self-pay | Admitting: *Deleted

## 2014-03-10 DIAGNOSIS — R110 Nausea: Secondary | ICD-10-CM | POA: Diagnosis not present

## 2014-03-10 DIAGNOSIS — E785 Hyperlipidemia, unspecified: Secondary | ICD-10-CM | POA: Diagnosis not present

## 2014-03-10 DIAGNOSIS — Z853 Personal history of malignant neoplasm of breast: Secondary | ICD-10-CM | POA: Insufficient documentation

## 2014-03-10 DIAGNOSIS — Z87891 Personal history of nicotine dependence: Secondary | ICD-10-CM | POA: Insufficient documentation

## 2014-03-10 DIAGNOSIS — R079 Chest pain, unspecified: Secondary | ICD-10-CM | POA: Diagnosis not present

## 2014-03-10 DIAGNOSIS — Z79899 Other long term (current) drug therapy: Secondary | ICD-10-CM | POA: Diagnosis not present

## 2014-03-10 DIAGNOSIS — R1013 Epigastric pain: Secondary | ICD-10-CM | POA: Insufficient documentation

## 2014-03-10 DIAGNOSIS — I209 Angina pectoris, unspecified: Secondary | ICD-10-CM | POA: Insufficient documentation

## 2014-03-10 DIAGNOSIS — I10 Essential (primary) hypertension: Secondary | ICD-10-CM | POA: Insufficient documentation

## 2014-03-10 HISTORY — DX: Essential (primary) hypertension: I10

## 2014-03-10 HISTORY — DX: Other forms of angina pectoris: I20.89

## 2014-03-10 HISTORY — DX: Other forms of angina pectoris: I20.8

## 2014-03-10 LAB — COMPREHENSIVE METABOLIC PANEL
ALT: 27 U/L (ref 0–35)
AST: 26 U/L (ref 0–37)
Albumin: 4.4 g/dL (ref 3.5–5.2)
Alkaline Phosphatase: 99 U/L (ref 39–117)
Anion gap: 13 (ref 5–15)
BUN: 12 mg/dL (ref 6–23)
CO2: 19 mmol/L (ref 19–32)
Calcium: 9.6 mg/dL (ref 8.4–10.5)
Chloride: 108 mmol/L (ref 96–112)
Creatinine, Ser: 0.91 mg/dL (ref 0.50–1.10)
GFR calc Af Amer: 78 mL/min — ABNORMAL LOW (ref >90)
GFR calc non Af Amer: 68 mL/min — ABNORMAL LOW (ref >90)
Glucose, Bld: 88 mg/dL (ref 70–99)
Potassium: 4 mmol/L (ref 3.5–5.1)
Sodium: 140 mmol/L (ref 135–145)
Total Bilirubin: 0.8 mg/dL (ref 0.3–1.2)
Total Protein: 7.8 g/dL (ref 6.0–8.3)

## 2014-03-10 LAB — CBC
HCT: 37.8 % (ref 36.0–46.0)
Hemoglobin: 12.8 g/dL (ref 12.0–15.0)
MCH: 29.2 pg (ref 26.0–34.0)
MCHC: 33.9 g/dL (ref 30.0–36.0)
MCV: 86.1 fL (ref 78.0–100.0)
Platelets: 302 10*3/uL (ref 150–400)
RBC: 4.39 MIL/uL (ref 3.87–5.11)
RDW: 12.5 % (ref 11.5–15.5)
WBC: 5.6 10*3/uL (ref 4.0–10.5)

## 2014-03-10 LAB — I-STAT TROPONIN, ED
Troponin i, poc: 0 ng/mL (ref 0.00–0.08)
Troponin i, poc: 0 ng/mL (ref 0.00–0.08)

## 2014-03-10 MED ORDER — NITROGLYCERIN 0.4 MG SL SUBL
0.4000 mg | SUBLINGUAL_TABLET | Freq: Once | SUBLINGUAL | Status: AC
  Administered 2014-03-10: 0.4 mg via SUBLINGUAL
  Filled 2014-03-10: qty 25
  Filled 2014-03-10: qty 1

## 2014-03-10 MED ORDER — OMEPRAZOLE MAGNESIUM 20 MG PO TBEC
20.0000 mg | DELAYED_RELEASE_TABLET | Freq: Every day | ORAL | Status: DC
Start: 2014-03-10 — End: 2018-08-10

## 2014-03-10 NOTE — ED Notes (Signed)
Pt with intermittent mid-sternal squeezing pain since Sat.  Was seen at pcp's today who sent her here for ekg changes.  Hx of angina.

## 2014-03-10 NOTE — ED Notes (Addendum)
Pt experiencing chest discomfort after having relief with 1 nitro for an hour. 1/10 on pain scale.

## 2014-03-10 NOTE — ED Notes (Signed)
Pt undressed, in gown, on monitor, continuous pulse oximetry and blood pressure cuff; visitor at bedside 

## 2014-03-10 NOTE — ED Provider Notes (Signed)
CSN: [id number]     Arrival date & time 03/10/14  13 History   First MD Initiated Contact with Patient 03/10/14 1113     Chief Complaint  Patient presents with  . Chest Pain     (Consider location/radiation/quality/duration/timing/severity/associated sxs/prior Treatment) Patient is a 59 y.o. female presenting with chest pain. The history is provided by the patient.  Chest Pain Associated symptoms: nausea   Associated symptoms: no abdominal pain, no back pain, no headache, no numbness, no shortness of breath, not vomiting and no weakness    patient was some chest pain that radiates from her epigastric area toward chest. Has had it on and off over the last 5 days. She's had nausea vomiting. The pain is improved after the vomiting. Pain does not come on at rest. His head previous angina but also has had a nonobstructive heart catheterization a few months ago. The pain is dull. No fevers. No shortness of breath.  Past Medical History  Diagnosis Date  . Hyperlipidemia   . Cancer     breast  . Hypertension   . Angina at rest    Past Surgical History  Procedure Laterality Date  . Mastectomy, partial Left 2000  . Left heart catheterization with coronary angiogram N/A 10/21/2013    Procedure: LEFT HEART CATHETERIZATION WITH CORONARY ANGIOGRAM;  Surgeon: Burnell Blanks, MD;  Location: [facility] CATH LAB;  Service: Cardiovascular;  Laterality: N/A;  . Tonsillectomy    . Adenoidectomy     Family History  Problem Relation Age of Onset  . Heart disease Mother   . Cancer Mother   . Hypertension Father   . Cancer Father   . Hypertension Sister   . Heart disease Sister   . Hypertension Brother   . Hyperlipidemia Maternal Grandmother   . Stroke Maternal Grandmother   . Cancer Maternal Grandfather   . Hypertension Brother    History  Substance Use Topics  . Smoking status: Former Smoker -- 1.00 packs/day for 38 years    Types: Cigarettes  . Smokeless tobacco: Not on file  . Alcohol  Use: No   OB History    No data available     Review of Systems  Constitutional: Negative for activity change and appetite change.  Eyes: Negative for pain.  Respiratory: Negative for chest tightness and shortness of breath.   Cardiovascular: Positive for chest pain. Negative for leg swelling.  Gastrointestinal: Positive for nausea. Negative for vomiting, abdominal pain and diarrhea.  Genitourinary: Negative for flank pain.  Musculoskeletal: Negative for back pain and neck stiffness.  Skin: Negative for rash.  Neurological: Negative for weakness, numbness and headaches.  Psychiatric/Behavioral: Negative for behavioral problems.      Allergies  Benadryl and Sulfa antibiotics  Home Medications   Prior to Admission medications   Medication Sig Start Date End Date Taking? Authorizing Provider  albuterol (PROVENTIL HFA;VENTOLIN HFA) 108 (90 BASE) MCG/ACT inhaler Inhale 2 puffs into the lungs every 6 (six) hours as needed for wheezing or shortness of breath.   Yes Historical Provider, MD  amLODipine (NORVASC) 5 MG tablet Take 1 tablet (5 mg total) by mouth daily. 11/05/13  Yes Isaiah Serge, NP  atorvastatin (LIPITOR) 10 MG tablet Take 10 mg by mouth daily.   Yes Historical Provider, MD  Cholecalciferol 1000 UNITS capsule Take 1,000 Units by mouth daily.    Yes Historical Provider, MD  tiotropium (SPIRIVA) 18 MCG inhalation capsule Place 18 mcg into inhaler and inhale daily.   Yes  Historical Provider, MD  omeprazole (PRILOSEC OTC) 20 MG tablet Take 1 tablet (20 mg total) by mouth daily. 03/10/14   Jasper Riling. Pickering, MD   BP 127/57 mmHg  Pulse 62  Temp(Src) 97.8 F (36.6 C) (Oral)  Resp 15  Ht 5' 3.5" (1.613 m)  Wt 157 lb (71.215 kg)  BMI 27.37 kg/m2  SpO2 100% Physical Exam  Constitutional: She is oriented to person, place, and time. She appears well-developed and well-nourished.  HENT:  Head: Normocephalic and atraumatic.  Eyes: Pupils are equal, round, and reactive to  light.  Neck: Normal range of motion.  Cardiovascular: Normal rate, regular rhythm and normal heart sounds.   No murmur heard. Pulmonary/Chest: Effort normal and breath sounds normal. No respiratory distress. She has no wheezes. She has no rales.  Abdominal: Soft. Bowel sounds are normal. She exhibits no distension. There is no tenderness.  Musculoskeletal: Normal range of motion.  Neurological: She is alert and oriented to person, place, and time. No cranial nerve deficit.  Skin: Skin is warm and dry.  Psychiatric: She has a normal mood and affect. Her speech is normal.  Nursing note and vitals reviewed.   ED Course  Procedures (including critical care time) Labs Review Labs Reviewed  COMPREHENSIVE METABOLIC PANEL - Abnormal; Notable for the following:    GFR calc non Af Amer 68 (*)    GFR calc Af Amer 78 (*)    All other components within normal limits  CBC  I-STAT TROPOININ, ED  Randolm Idol, ED    Imaging Review Dg Chest 2 View  03/10/2014   CLINICAL DATA:  Five day history of chest pain and difficulty breathing  EXAM: CHEST  2 VIEW  COMPARISON:  September 09, 2013  FINDINGS: There is no edema or consolidation. The heart size and pulmonary vascularity are normal. No adenopathy. There are postoperative change in the left breast region. There is mild degenerative change in the thoracic spine. No pneumothorax.  IMPRESSION: No edema or consolidation.   Electronically Signed   By: Lowella Grip III M.D.   On: 03/10/2014 13:18     EKG Interpretation   Date/Time:  Thursday March 10 2014 11:06:42 EST Ventricular Rate:  96 PR Interval:  136 QRS Duration: 74 QT Interval:  338 QTC Calculation: 427 R Axis:   88 Text Interpretation:  Normal sinus rhythm Low voltage QRS Borderline ECG  Confirmed by Alvino Chapel  MD, NATHAN [phone]) on 03/10/2014 11:14:14 AM      MDM   Final diagnoses:  Chest pain, unspecified chest pain type    Patient with chest pain. EKG reassuring and  also had recent nonobstructive cath doubt cardiac cause. Pain was relieved by nitroglycerin but still could be esophageal. Will start short course of PPI and follow-up with PCP. EKG appears stable to one we have here prior.    Jasper Riling. Alvino Chapel, MD 03/10/14 1539

## 2014-03-10 NOTE — Discharge Instructions (Signed)

## 2014-07-29 ENCOUNTER — Encounter: Payer: Self-pay | Admitting: Cardiovascular Disease

## 2014-07-29 ENCOUNTER — Ambulatory Visit (INDEPENDENT_AMBULATORY_CARE_PROVIDER_SITE_OTHER): Payer: BLUE CROSS/BLUE SHIELD | Admitting: Cardiovascular Disease

## 2014-07-29 VITALS — BP 124/70 | HR 63 | Ht 63.0 in | Wt 158.0 lb

## 2014-07-29 DIAGNOSIS — R931 Abnormal findings on diagnostic imaging of heart and coronary circulation: Secondary | ICD-10-CM | POA: Diagnosis not present

## 2014-07-29 DIAGNOSIS — I2510 Atherosclerotic heart disease of native coronary artery without angina pectoris: Secondary | ICD-10-CM

## 2014-07-29 DIAGNOSIS — I10 Essential (primary) hypertension: Secondary | ICD-10-CM

## 2014-07-29 DIAGNOSIS — E7800 Pure hypercholesterolemia, unspecified: Secondary | ICD-10-CM

## 2014-07-29 DIAGNOSIS — I2584 Coronary atherosclerosis due to calcified coronary lesion: Secondary | ICD-10-CM

## 2014-07-29 DIAGNOSIS — R9439 Abnormal result of other cardiovascular function study: Secondary | ICD-10-CM

## 2014-07-29 MED ORDER — AMLODIPINE BESYLATE 2.5 MG PO TABS: 2.5000 mg | ORAL_TABLET | Freq: Every day | ORAL | Status: DC

## 2014-07-29 NOTE — Patient Instructions (Signed)
Medication Instructions:   DECREASE AMLODIPNE  2.5MG  DAILY   Follow-Up:  Dr. Sallyanne Kuster recommends that you schedule a follow-up appointment in: 6 MONTHS

## 2014-07-29 NOTE — Progress Notes (Signed)
Patient ID: Dorothy Frederick, female   DOB: November 29, 1955, 59 y.o.   MRN: [id number]     Cardiology Office Note   Date:  07/30/2014   ID:  Dorothy Frederick, DOB Jul 15, 1955, MRN [id number]  PCP:  Dorothy Cipro, MD  Cardiologist:   Dorothy Klein, MD   Chief Complaint  Patient presents with  . 10 month follow up    Patient has felt light headed, dizzy, SOB, and swelling.      History of Present Illness: Dorothy Frederick is a 59 y.o. female who presents for  Follow-up roughly 8 months after undergoing cardiac catheterization. At that time she presented with atypical chest discomfort and had an abnormal nuclear stress test showing an extensive with mild anterior wall abnormality. Cardiac catheterization showed mild diffuse plaque in the proximal LAD artery and a 10-20 percent stenosis in the second oblique marginal artery, without any meaningful obstructive lesions. Since then she has had one more episode of chest discomfort with an evaluation that suggested probable esophageal etiology. She has no cardiovascular complaints at this time except that she occasionally feels lightheaded when standing up and has occasional mild ankle swelling. Her blood pressure has been consistently normal at times even low. She takes amlodipine 5 mg daily and is on statin therapy. She does not smoke any longer.  Primary care physician, Dr. Ernie Frederick, recently performed lab tests , but I don't yet have those for review.    Past Medical History  Diagnosis Date  . Hyperlipidemia   . Cancer     breast  . Hypertension   . Angina at rest     Past Surgical History  Procedure Laterality Date  . Mastectomy, partial Left 2000  . Left heart catheterization with coronary angiogram N/A 10/21/2013    Procedure: LEFT HEART CATHETERIZATION WITH CORONARY ANGIOGRAM;  Surgeon: Dorothy Blanks, MD;  Location: [facility] CATH LAB;  Service: Cardiovascular;  Laterality: N/A;  . Tonsillectomy    . Adenoidectomy       Current Outpatient  Prescriptions  Medication Sig Dispense Refill  . albuterol (PROVENTIL HFA;VENTOLIN HFA) 108 (90 BASE) MCG/ACT inhaler Inhale 2 puffs into the lungs every 6 (six) hours as needed for wheezing or shortness of breath.    Marland Kitchen amLODipine (NORVASC) 2.5 MG tablet Take 1 tablet (2.5 mg total) by mouth daily. 30 tablet 11  . atorvastatin (LIPITOR) 10 MG tablet Take 40 mg by mouth daily.     . Cholecalciferol 1000 UNITS capsule Take 500 Units by mouth daily.     Marland Kitchen omeprazole (PRILOSEC OTC) 20 MG tablet Take 1 tablet (20 mg total) by mouth daily. (Patient taking differently: Take 40 mg by mouth daily. ) 14 tablet 0   No current facility-administered medications for this visit.    Allergies:   Benadryl and Sulfa antibiotics    Social History:  The patient  reports that she has quit smoking. Her smoking use included Cigarettes. She has a 38 pack-year smoking history. She does not have any smokeless tobacco history on file. She reports that she does not drink alcohol or use illicit drugs.   Family History:  The patient's family history includes Cancer in her father, maternal grandfather, and mother; Heart disease in her mother and sister; Hyperlipidemia in her maternal grandmother; Hypertension in her brother, brother, father, and sister; Stroke in her maternal grandmother.    ROS:  Please see the history of present illness.    Otherwise, review of systems positive for none.   All other systems are reviewed  and negative.    PHYSICAL EXAM: VS:  BP 124/70 mmHg  Pulse 63  Ht 5\' 3"  (1.6 m)  Wt 158 lb (71.668 kg)  BMI 28.00 kg/m2 , BMI Body mass index is 28 kg/(m^2).  General: Alert, oriented x3, no distress Head: no evidence of trauma, PERRL, EOMI, no exophtalmos or lid lag, no myxedema, no xanthelasma; normal ears, nose and oropharynx Neck: normal jugular venous pulsations and no hepatojugular reflux; brisk carotid pulses without delay and no carotid bruits Chest: clear to auscultation, no signs of  consolidation by percussion or palpation, normal fremitus, symmetrical and full respiratory excursions Cardiovascular: normal position and quality of the apical impulse, regular rhythm, normal first and second heart sounds, no murmurs, rubs or gallops Abdomen: no tenderness or distention, no masses by palpation, no abnormal pulsatility or arterial bruits, normal bowel sounds, no hepatosplenomegaly Extremities: no clubbing, cyanosis or edema; 2+ radial, ulnar and brachial pulses bilaterally; 2+ right femoral, posterior tibial and dorsalis pedis pulses; 2+ left femoral, posterior tibial and dorsalis pedis pulses; no subclavian or femoral bruits Neurological: grossly nonfocal Psych: euthymic mood, full affect   EKG:  EKG is ordered today. The ekg ordered today demonstrates  NSR, nonspecific T-wave inversion in leads V1-V3, QTC 392 ms   Recent Labs: 03/10/2014: ALT 27; BUN 12; Creatinine, Ser 0.91; Hemoglobin 12.8; Platelets 302; Potassium 4.0; Sodium 140    Lipid Panel August 2015 total cholesterol 203, triglycerides 185, HDL 56, calculated LDL of 110  Wt Readings from Last 3 Encounters:  07/29/14 158 lb (71.668 kg)  03/10/14 157 lb (71.215 kg)  11/05/13 153 lb 6.4 oz (69.582 kg)        ASSESSMENT AND PLAN:   Mrs. Caspar has minimal coronary atherosclerosis without flow limiting lesions and the focus is on modification of risk factors. I congratulated her on not smoking for over a year now. She is on a low dose of statin and there is room for improvement. Target LDL less than 100 (will review the recently performed labs). Her blood pressure is very well controlled and she seems to have some side effects from the amlodipine ( occasional dizziness and ankle swelling) so I told her to reduce the dose to 2.5 mg daily.  Current medicines are reviewed at length with the patient today.  The patient does not have concerns regarding medicines.   Labs/ tests ordered today include:  Orders  Placed This Encounter  Procedures  . EKG 12-Lead   Patient Instructions  Medication Instructions:   DECREASE AMLODIPNE  2.5MG  DAILY   Follow-Up:  Dr. Sallyanne Kuster recommends that you schedule a follow-up appointment in: [place], CROITORU,MIHAI, MD  07/30/2014 8:53 AM    Dorothy Klein, MD, [facility] [phone] office [phone] pager

## 2014-07-30 ENCOUNTER — Encounter: Payer: Self-pay | Admitting: Cardiovascular Disease

## 2014-07-30 DIAGNOSIS — I2584 Coronary atherosclerosis due to calcified coronary lesion: Secondary | ICD-10-CM

## 2014-07-30 DIAGNOSIS — I2510 Atherosclerotic heart disease of native coronary artery without angina pectoris: Secondary | ICD-10-CM | POA: Insufficient documentation

## 2015-01-25 ENCOUNTER — Ambulatory Visit: Payer: BLUE CROSS/BLUE SHIELD | Admitting: Cardiovascular Disease

## 2015-03-15 ENCOUNTER — Ambulatory Visit: Payer: BLUE CROSS/BLUE SHIELD | Admitting: Cardiovascular Disease

## 2015-08-27 ENCOUNTER — Other Ambulatory Visit: Payer: Self-pay | Admitting: Cardiovascular Disease

## 2015-08-28 NOTE — Telephone Encounter (Signed)
Rx(s) sent to pharmacy electronically.  

## 2015-09-15 ENCOUNTER — Other Ambulatory Visit: Payer: Self-pay | Admitting: Family Medicine

## 2015-09-15 DIAGNOSIS — E039 Hypothyroidism, unspecified: Secondary | ICD-10-CM

## 2015-09-15 DIAGNOSIS — R1310 Dysphagia, unspecified: Secondary | ICD-10-CM

## 2015-09-21 ENCOUNTER — Ambulatory Visit
Admission: RE | Admit: 2015-09-21 | Discharge: 2015-09-21 | Disposition: A | Discharge status: Home / Self Care | Payer: BLUE CROSS/BLUE SHIELD | Source: Ambulatory Visit | Attending: Family Medicine | Admitting: Family Medicine

## 2015-09-21 DIAGNOSIS — R1310 Dysphagia, unspecified: Secondary | ICD-10-CM

## 2015-09-22 ENCOUNTER — Ambulatory Visit
Admission: RE | Admit: 2015-09-22 | Discharge: 2015-09-22 | Disposition: A | Discharge status: Home / Self Care | Payer: BLUE CROSS/BLUE SHIELD | Source: Ambulatory Visit | Attending: Family Medicine | Admitting: Family Medicine

## 2015-09-22 DIAGNOSIS — E039 Hypothyroidism, unspecified: Secondary | ICD-10-CM

## 2015-11-13 DIAGNOSIS — E039 Hypothyroidism, unspecified: Secondary | ICD-10-CM | POA: Diagnosis not present

## 2015-11-17 DIAGNOSIS — Z6827 Body mass index (BMI) 27.0-27.9, adult: Secondary | ICD-10-CM | POA: Diagnosis not present

## 2015-11-17 DIAGNOSIS — I10 Essential (primary) hypertension: Secondary | ICD-10-CM | POA: Diagnosis not present

## 2015-11-17 DIAGNOSIS — K219 Gastro-esophageal reflux disease without esophagitis: Secondary | ICD-10-CM | POA: Diagnosis not present

## 2015-11-17 DIAGNOSIS — Z23 Encounter for immunization: Secondary | ICD-10-CM | POA: Diagnosis not present

## 2015-11-17 DIAGNOSIS — E039 Hypothyroidism, unspecified: Secondary | ICD-10-CM | POA: Diagnosis not present

## 2016-01-02 DIAGNOSIS — R921 Mammographic calcification found on diagnostic imaging of breast: Secondary | ICD-10-CM | POA: Diagnosis not present

## 2016-01-03 ENCOUNTER — Other Ambulatory Visit: Payer: Self-pay | Admitting: Radiology

## 2016-01-03 DIAGNOSIS — R921 Mammographic calcification found on diagnostic imaging of breast: Secondary | ICD-10-CM | POA: Diagnosis not present

## 2016-01-03 DIAGNOSIS — N6012 Diffuse cystic mastopathy of left breast: Secondary | ICD-10-CM | POA: Diagnosis not present

## 2016-01-18 DIAGNOSIS — E039 Hypothyroidism, unspecified: Secondary | ICD-10-CM | POA: Diagnosis not present

## 2016-01-23 DIAGNOSIS — Z23 Encounter for immunization: Secondary | ICD-10-CM | POA: Diagnosis not present

## 2016-01-23 DIAGNOSIS — Z6828 Body mass index (BMI) 28.0-28.9, adult: Secondary | ICD-10-CM | POA: Diagnosis not present

## 2016-01-23 DIAGNOSIS — I10 Essential (primary) hypertension: Secondary | ICD-10-CM | POA: Diagnosis not present

## 2016-01-23 DIAGNOSIS — E039 Hypothyroidism, unspecified: Secondary | ICD-10-CM | POA: Diagnosis not present

## 2016-01-23 DIAGNOSIS — K219 Gastro-esophageal reflux disease without esophagitis: Secondary | ICD-10-CM | POA: Diagnosis not present

## 2016-02-14 DIAGNOSIS — I10 Essential (primary) hypertension: Secondary | ICD-10-CM | POA: Diagnosis not present

## 2016-02-14 DIAGNOSIS — E039 Hypothyroidism, unspecified: Secondary | ICD-10-CM | POA: Diagnosis not present

## 2016-02-14 DIAGNOSIS — R600 Localized edema: Secondary | ICD-10-CM | POA: Diagnosis not present

## 2016-02-14 DIAGNOSIS — Z6828 Body mass index (BMI) 28.0-28.9, adult: Secondary | ICD-10-CM | POA: Diagnosis not present

## 2016-04-15 DIAGNOSIS — K219 Gastro-esophageal reflux disease without esophagitis: Secondary | ICD-10-CM | POA: Diagnosis not present

## 2016-04-15 DIAGNOSIS — Z6828 Body mass index (BMI) 28.0-28.9, adult: Secondary | ICD-10-CM | POA: Diagnosis not present

## 2016-04-15 DIAGNOSIS — M19049 Primary osteoarthritis, unspecified hand: Secondary | ICD-10-CM | POA: Diagnosis not present

## 2016-04-15 DIAGNOSIS — I10 Essential (primary) hypertension: Secondary | ICD-10-CM | POA: Diagnosis not present

## 2016-05-01 DIAGNOSIS — J309 Allergic rhinitis, unspecified: Secondary | ICD-10-CM | POA: Diagnosis not present

## 2016-05-13 DIAGNOSIS — E559 Vitamin D deficiency, unspecified: Secondary | ICD-10-CM | POA: Diagnosis not present

## 2016-05-13 DIAGNOSIS — E039 Hypothyroidism, unspecified: Secondary | ICD-10-CM | POA: Diagnosis not present

## 2016-05-15 DIAGNOSIS — M19049 Primary osteoarthritis, unspecified hand: Secondary | ICD-10-CM | POA: Diagnosis not present

## 2016-05-15 DIAGNOSIS — E039 Hypothyroidism, unspecified: Secondary | ICD-10-CM | POA: Diagnosis not present

## 2016-05-15 DIAGNOSIS — E559 Vitamin D deficiency, unspecified: Secondary | ICD-10-CM | POA: Diagnosis not present

## 2016-05-15 DIAGNOSIS — Z23 Encounter for immunization: Secondary | ICD-10-CM | POA: Diagnosis not present

## 2016-05-15 DIAGNOSIS — K219 Gastro-esophageal reflux disease without esophagitis: Secondary | ICD-10-CM | POA: Diagnosis not present

## 2016-07-04 DIAGNOSIS — M8589 Other specified disorders of bone density and structure, multiple sites: Secondary | ICD-10-CM | POA: Diagnosis not present

## 2016-07-04 DIAGNOSIS — N6001 Solitary cyst of right breast: Secondary | ICD-10-CM | POA: Diagnosis not present

## 2016-07-04 DIAGNOSIS — R920 Mammographic microcalcification found on diagnostic imaging of breast: Secondary | ICD-10-CM | POA: Diagnosis not present

## 2016-07-04 DIAGNOSIS — M810 Age-related osteoporosis without current pathological fracture: Secondary | ICD-10-CM | POA: Diagnosis not present

## 2016-07-15 DIAGNOSIS — Z13220 Encounter for screening for lipoid disorders: Secondary | ICD-10-CM | POA: Diagnosis not present

## 2016-07-15 DIAGNOSIS — Z0000 Encounter for general adult medical examination without abnormal findings: Secondary | ICD-10-CM | POA: Diagnosis not present

## 2016-07-17 DIAGNOSIS — E559 Vitamin D deficiency, unspecified: Secondary | ICD-10-CM | POA: Diagnosis not present

## 2016-07-17 DIAGNOSIS — K219 Gastro-esophageal reflux disease without esophagitis: Secondary | ICD-10-CM | POA: Diagnosis not present

## 2016-07-17 DIAGNOSIS — Z0000 Encounter for general adult medical examination without abnormal findings: Secondary | ICD-10-CM | POA: Diagnosis not present

## 2016-07-17 DIAGNOSIS — M810 Age-related osteoporosis without current pathological fracture: Secondary | ICD-10-CM | POA: Diagnosis not present

## 2016-07-17 DIAGNOSIS — Z01419 Encounter for gynecological examination (general) (routine) without abnormal findings: Secondary | ICD-10-CM | POA: Diagnosis not present

## 2016-07-17 DIAGNOSIS — Z1211 Encounter for screening for malignant neoplasm of colon: Secondary | ICD-10-CM | POA: Diagnosis not present

## 2016-07-17 DIAGNOSIS — Z6827 Body mass index (BMI) 27.0-27.9, adult: Secondary | ICD-10-CM | POA: Diagnosis not present

## 2016-11-06 DIAGNOSIS — E782 Mixed hyperlipidemia: Secondary | ICD-10-CM | POA: Diagnosis not present

## 2016-11-06 DIAGNOSIS — E039 Hypothyroidism, unspecified: Secondary | ICD-10-CM | POA: Diagnosis not present

## 2016-11-06 DIAGNOSIS — K219 Gastro-esophageal reflux disease without esophagitis: Secondary | ICD-10-CM | POA: Diagnosis not present

## 2016-11-06 DIAGNOSIS — Z6827 Body mass index (BMI) 27.0-27.9, adult: Secondary | ICD-10-CM | POA: Diagnosis not present

## 2016-11-06 DIAGNOSIS — I10 Essential (primary) hypertension: Secondary | ICD-10-CM | POA: Diagnosis not present

## 2016-11-06 DIAGNOSIS — Z23 Encounter for immunization: Secondary | ICD-10-CM | POA: Diagnosis not present

## 2016-11-12 DIAGNOSIS — F411 Generalized anxiety disorder: Secondary | ICD-10-CM | POA: Diagnosis not present

## 2016-11-12 DIAGNOSIS — K219 Gastro-esophageal reflux disease without esophagitis: Secondary | ICD-10-CM | POA: Diagnosis not present

## 2016-11-12 DIAGNOSIS — E039 Hypothyroidism, unspecified: Secondary | ICD-10-CM | POA: Diagnosis not present

## 2016-11-12 DIAGNOSIS — I10 Essential (primary) hypertension: Secondary | ICD-10-CM | POA: Diagnosis not present

## 2016-12-24 DIAGNOSIS — Z6827 Body mass index (BMI) 27.0-27.9, adult: Secondary | ICD-10-CM | POA: Diagnosis not present

## 2016-12-24 DIAGNOSIS — R5383 Other fatigue: Secondary | ICD-10-CM | POA: Diagnosis not present

## 2016-12-24 DIAGNOSIS — K219 Gastro-esophageal reflux disease without esophagitis: Secondary | ICD-10-CM | POA: Diagnosis not present

## 2016-12-24 DIAGNOSIS — F411 Generalized anxiety disorder: Secondary | ICD-10-CM | POA: Diagnosis not present

## 2017-03-07 DIAGNOSIS — J449 Chronic obstructive pulmonary disease, unspecified: Secondary | ICD-10-CM | POA: Diagnosis not present

## 2017-03-07 DIAGNOSIS — K219 Gastro-esophageal reflux disease without esophagitis: Secondary | ICD-10-CM | POA: Diagnosis not present

## 2017-03-07 DIAGNOSIS — Z6827 Body mass index (BMI) 27.0-27.9, adult: Secondary | ICD-10-CM | POA: Diagnosis not present

## 2017-03-07 DIAGNOSIS — F411 Generalized anxiety disorder: Secondary | ICD-10-CM | POA: Diagnosis not present

## 2017-03-14 ENCOUNTER — Other Ambulatory Visit: Payer: Self-pay | Admitting: Family Medicine

## 2017-03-14 DIAGNOSIS — F17200 Nicotine dependence, unspecified, uncomplicated: Secondary | ICD-10-CM

## 2017-03-17 ENCOUNTER — Ambulatory Visit
Admission: RE | Admit: 2017-03-17 | Discharge: 2017-03-17 | Disposition: A | Discharge status: Home / Self Care | Payer: BLUE CROSS/BLUE SHIELD | Source: Ambulatory Visit | Attending: Family Medicine | Admitting: Family Medicine

## 2017-03-17 DIAGNOSIS — Z87891 Personal history of nicotine dependence: Secondary | ICD-10-CM | POA: Diagnosis not present

## 2017-03-17 DIAGNOSIS — F17200 Nicotine dependence, unspecified, uncomplicated: Secondary | ICD-10-CM

## 2017-04-02 DIAGNOSIS — R51 Headache: Secondary | ICD-10-CM | POA: Diagnosis not present

## 2017-04-02 DIAGNOSIS — I10 Essential (primary) hypertension: Secondary | ICD-10-CM | POA: Diagnosis not present

## 2017-04-02 DIAGNOSIS — J449 Chronic obstructive pulmonary disease, unspecified: Secondary | ICD-10-CM | POA: Diagnosis not present

## 2017-04-02 DIAGNOSIS — Z6827 Body mass index (BMI) 27.0-27.9, adult: Secondary | ICD-10-CM | POA: Diagnosis not present

## 2017-04-14 DIAGNOSIS — Z6828 Body mass index (BMI) 28.0-28.9, adult: Secondary | ICD-10-CM | POA: Diagnosis not present

## 2017-04-14 DIAGNOSIS — R42 Dizziness and giddiness: Secondary | ICD-10-CM | POA: Diagnosis not present

## 2017-04-14 DIAGNOSIS — J449 Chronic obstructive pulmonary disease, unspecified: Secondary | ICD-10-CM | POA: Diagnosis not present

## 2017-04-14 DIAGNOSIS — J309 Allergic rhinitis, unspecified: Secondary | ICD-10-CM | POA: Diagnosis not present

## 2017-05-23 DIAGNOSIS — J449 Chronic obstructive pulmonary disease, unspecified: Secondary | ICD-10-CM | POA: Diagnosis not present

## 2017-05-23 DIAGNOSIS — I10 Essential (primary) hypertension: Secondary | ICD-10-CM | POA: Diagnosis not present

## 2017-05-23 DIAGNOSIS — J309 Allergic rhinitis, unspecified: Secondary | ICD-10-CM | POA: Diagnosis not present

## 2017-05-23 DIAGNOSIS — K219 Gastro-esophageal reflux disease without esophagitis: Secondary | ICD-10-CM | POA: Diagnosis not present

## 2017-07-03 DIAGNOSIS — R928 Other abnormal and inconclusive findings on diagnostic imaging of breast: Secondary | ICD-10-CM | POA: Diagnosis not present

## 2017-07-03 DIAGNOSIS — Z853 Personal history of malignant neoplasm of breast: Secondary | ICD-10-CM | POA: Diagnosis not present

## 2017-07-03 DIAGNOSIS — N6001 Solitary cyst of right breast: Secondary | ICD-10-CM | POA: Diagnosis not present

## 2017-07-21 DIAGNOSIS — Z114 Encounter for screening for human immunodeficiency virus [HIV]: Secondary | ICD-10-CM | POA: Diagnosis not present

## 2017-07-21 DIAGNOSIS — Z0000 Encounter for general adult medical examination without abnormal findings: Secondary | ICD-10-CM | POA: Diagnosis not present

## 2017-07-21 DIAGNOSIS — Z1329 Encounter for screening for other suspected endocrine disorder: Secondary | ICD-10-CM | POA: Diagnosis not present

## 2017-07-28 DIAGNOSIS — Z1211 Encounter for screening for malignant neoplasm of colon: Secondary | ICD-10-CM | POA: Diagnosis not present

## 2017-07-28 DIAGNOSIS — Z0000 Encounter for general adult medical examination without abnormal findings: Secondary | ICD-10-CM | POA: Diagnosis not present

## 2017-07-28 DIAGNOSIS — Z6829 Body mass index (BMI) 29.0-29.9, adult: Secondary | ICD-10-CM | POA: Diagnosis not present

## 2017-07-28 DIAGNOSIS — Z13220 Encounter for screening for lipoid disorders: Secondary | ICD-10-CM | POA: Diagnosis not present

## 2017-09-24 ENCOUNTER — Telehealth: Payer: Self-pay

## 2017-09-24 NOTE — Telephone Encounter (Signed)
   [facility] Pre-operative Risk Assessment    Request for surgical clearance:  1. What type of surgery is being performed? Periodontal treatment Probable extraction  2. When is this surgery scheduled? TBD  3. What type of clearance is required (medical clearance vs. Pharmacy clearance to hold med vs. Both)? Both  4. Are there any medications that need to be held prior to surgery and how long? HOLD Fosamax  5. Practice name and name of physician performing surgery? C. Jeannie Done, DDS  6. What is your office phone number [phone]    7.   What is your office fax number [phone]  8.   Anesthesia type (None, local, MAC, general) ? Not listed   Lamar Laundry 09/24/2017, 3:12 PM  _________________________________________________________________   (provider comments below)

## 2017-09-25 NOTE — Telephone Encounter (Signed)
   Primary Cardiologist:Mihai Croitoru, MD (last seen 07/2014, will need new provider appointment)  Chart reviewed as part of pre-operative protocol coverage. Because of Dorothy Frederick's past medical history and time since last visit, he/she will require a follow-up visit in order to better assess preoperative cardiovascular risk.  Pre-op covering staff: - Please schedule appointment and call patient to inform them. - Please contact requesting surgeon's office via preferred method (i.e, phone, fax) to inform them of need for appointment prior to surgery.  [place], [place]  09/25/2017, 2:36 PM

## 2017-09-25 NOTE — Telephone Encounter (Signed)
Spoke with pt re: surgical clearance.  She has been scheduled to see Doreene Adas, PA-C, 10/09/17. Had to take 2 72 hours spots due to timing of surgery and availability.

## 2017-10-08 NOTE — Progress Notes (Signed)
Cardiology Office Note:    Date:  10/09/2017   ID:  Dorothy Frederick, DOB November 25, 1955, MRN [id number]  PCP:  [name], MD  Cardiologist:  Skeet Latch, MD   Referring MD: No ref. provider found   Chief Complaint  Patient presents with  . New Patient (Initial Visit)    preop clearance    History of Present Illness:    [name] is a 62 y.o. female with a hx of former tobacco abuse, hyperlipidemia, breast cancer status post radiation treatment, and nonobstructive CAD with a heart catheterization following an abnormal stress test that showed diffuse plaque in the proximal LAD and 10 to 20% stenosis in the second.  No significant obstructive lesions and medical therapy was recommended.  She will had one additional episode of chest discomfort with an evaluation that suggested esophageal etiology.  She last saw Dr. Sallyanne Kuster she had quit smoking at that time. She was maintained on 5 mg of amlodipine and statin medication.  She is seen today as a new patient evaluation and for cardiac clearance possible tooth extraction. She has done well since her last visit. Her chest pain has resolved with zantac. She walks 1 mile almost daily and only reports dyspnea. DOE has been baseline for her. She is being followed with serial low dose CT chest scans given her smoking history. PCP recently changed her anti-hypertensive regimen from norvasc to olmesartan. She is tolerating this well and her pressure is generally 854-627O systolic. She has not new anginal symptoms. EKG with TWI V1/2/3 that were present prior to her 2015 heart cath and have been normal for her. Her PCP follows her cholesterol. I discussed lipid clinic if she needs.   Past Medical History:  Diagnosis Date  . Angina at rest [facility])   . Cancer ([place])    breast  . Hyperlipidemia   . Hypertension     Past Surgical History:  Procedure Laterality Date  . ADENOIDECTOMY    . LEFT HEART CATHETERIZATION WITH CORONARY ANGIOGRAM N/A  10/21/2013   Procedure: LEFT HEART CATHETERIZATION WITH CORONARY ANGIOGRAM;  Surgeon: Burnell Blanks, MD;  Location: [facility] CATH LAB;  Service: Cardiovascular;  Laterality: N/A;  . MASTECTOMY, PARTIAL Left 2000  . TONSILLECTOMY      Current Medications: Current Meds  Medication Sig  . albuterol (PROVENTIL HFA;VENTOLIN HFA) 108 (90 BASE) MCG/ACT inhaler Inhale 2 puffs into the lungs every 6 (six) hours as needed for wheezing or shortness of breath.  Marland Kitchen alendronate (FOSAMAX) 70 MG tablet Take 1 tablet by mouth once a week.  Marland Kitchen atorvastatin (LIPITOR) 10 MG tablet Take 40 mg by mouth daily.   . Cholecalciferol 1000 UNITS capsule Take 500 Units by mouth daily.   Marland Kitchen escitalopram (LEXAPRO) 10 MG tablet Take 10 mg by mouth daily.  Marland Kitchen levothyroxine (SYNTHROID, LEVOTHROID) 50 MCG tablet Take 1 tablet by mouth daily.  Marland Kitchen olmesartan (BENICAR) 20 MG tablet Take 20 mg by mouth daily.  Marland Kitchen omeprazole (PRILOSEC OTC) 20 MG tablet Take 1 tablet (20 mg total) by mouth daily. (Patient taking differently: Take 40 mg by mouth daily. )  . ranitidine (ZANTAC) 300 MG tablet Take 300 mg by mouth daily.  . SYMBICORT 160-4.5 MCG/ACT inhaler Inhale 2 puffs into the lungs 2 (two) times daily.     Allergies:   Benadryl [diphenhydramine hcl (sleep)] and Sulfa antibiotics   Social History   Socioeconomic History  . Marital status: Divorced    Spouse name: Not on file  . Number  of children: Not on file  . Years of education: Not on file  . Highest education level: Not on file  Occupational History  . Not on file  Social Needs  . Financial resource strain: Not on file  . Food insecurity:    Worry: Not on file    Inability: Not on file  . Transportation needs:    Medical: Not on file    Non-medical: Not on file  Tobacco Use  . Smoking status: Former Smoker    Packs/day: 1.00    Years: 38.00    Pack years: 38.00    Types: Cigarettes  . Smokeless tobacco: Never Used  Substance and Sexual Activity  . Alcohol  use: No  . Drug use: No  . Sexual activity: Not on file  Lifestyle  . Physical activity:    Days per week: Not on file    Minutes per session: Not on file  . Stress: Not on file  Relationships  . Social connections:    Talks on phone: Not on file    Gets together: Not on file    Attends religious service: Not on file    Active member of club or organization: Not on file    Attends meetings of clubs or organizations: Not on file    Relationship status: Not on file  Other Topics Concern  . Not on file  Social History Narrative  . Not on file     Family History: The patient's family history includes Cancer in her father, maternal grandfather, and mother; Heart disease in her mother and sister; Hyperlipidemia in her maternal grandmother; Hypertension in her brother, brother, father, and sister; Stroke in her maternal grandmother.  ROS:   Please see the history of present illness.     All other systems reviewed and are negative.  EKGs/Labs/Other Studies Reviewed:    The following studies were reviewed today:  Left heart cath 10/21/13: Impression: 1. Mild non-obstructive CAD 2. Normal LV systolic funtion 3. Normal LV pressures  Recommendations: Medical management of CAD.   EKG:  EKG is ordered today.  The ekg ordered today demonstrates sinus with TWI V1/2/3 (old)  Recent Labs: No results found for requested labs within last 8760 hours.  Recent Lipid Panel No results found for: CHOL, TRIG, HDL, CHOLHDL, VLDL, LDLCALC, LDLDIRECT  Physical Exam:    VS:  BP 116/66   Pulse 61   Ht 5' 3.5" (1.613 m)   Wt 172 lb 9.6 oz (78.3 kg)   SpO2 98%   BMI 30.10 kg/m     Wt Readings from Last 3 Encounters:  10/09/17 172 lb 9.6 oz (78.3 kg)  07/29/14 158 lb (71.7 kg)  03/10/14 157 lb (71.2 kg)     GEN: Well nourished, well developed in no acute distress HEENT: Normal NECK: No JVD; No carotid bruits CARDIAC: RRR, no murmurs, rubs, gallops RESPIRATORY:  Clear to auscultation  without rales, wheezing or rhonchi  ABDOMEN: Soft, non-tender, non-distended MUSCULOSKELETAL:  No edema; No deformity  SKIN: Warm and dry NEUROLOGIC:  Alert and oriented x 3 PSYCHIATRIC:  Normal affect   ASSESSMENT:    1. Coronary atherosclerosis due to calcified coronary lesion   2. Essential hypertension   3. Abnormal ECG   4. Hypercholesterolemia   5. Preoperative clearance    PLAN:    In order of problems listed above:  Coronary atherosclerosis due to calcified coronary lesion 10-20% nonobstructive disease in the Cx by heart cath in 2015 following an abnormal  stress test for anterior chest pain. Her chest pain is well-controlled with zantac. She does have dyspnea on exertion, but this is baseline for her and likely related to her emphysema. She is working with her PCP to find the right combination of inhalers. I do not suspect an ACS process.   Essential hypertension Pressures well-controlled on omesartan. Managed by her PCP.   Abnormal ECG TWI have been present since at least prior to her 2015 heart cath.  Hypercholesterolemia Her PCP follows her cholesterol. She currently takes lipitor 40 mg. We discussed lipid clinic here if needed. She will call our office if she is not controlled with her PCP.  Preoperative clearance She denies chest pain now that she is on a GERD regimen. DOE likely related to her emphysema. She walks 1 mile on most days without chest pain. I do not suspect a progression of her CAD. She is at acceptable risk for tooth extraction without further cardiac testing. I will fax a letter to the requesting provider.   Follow up in lipid clinic if needed. Follow up PRN with cardiology. PCP is managing HTN and HLD.    Medication Adjustments/Labs and Tests Ordered: Current medicines are reviewed at length with the patient today.  Concerns regarding medicines are outlined above.  No orders of the defined types were placed in this encounter.  No orders of the  defined types were placed in this encounter.   Signed, Ledora Bottcher, [place]  10/09/2017 11:47 AM    [facility]

## 2017-10-09 ENCOUNTER — Ambulatory Visit (INDEPENDENT_AMBULATORY_CARE_PROVIDER_SITE_OTHER): Payer: BLUE CROSS/BLUE SHIELD | Admitting: Physician Assistant

## 2017-10-09 ENCOUNTER — Encounter: Payer: Self-pay | Admitting: Physician Assistant

## 2017-10-09 ENCOUNTER — Telehealth: Payer: Self-pay | Admitting: Physician Assistant

## 2017-10-09 VITALS — BP 116/66 | HR 61 | Ht 63.5 in | Wt 172.6 lb

## 2017-10-09 DIAGNOSIS — I2510 Atherosclerotic heart disease of native coronary artery without angina pectoris: Secondary | ICD-10-CM

## 2017-10-09 DIAGNOSIS — I10 Essential (primary) hypertension: Secondary | ICD-10-CM

## 2017-10-09 DIAGNOSIS — R9431 Abnormal electrocardiogram [ECG] [EKG]: Secondary | ICD-10-CM | POA: Diagnosis not present

## 2017-10-09 DIAGNOSIS — Z01818 Encounter for other preprocedural examination: Secondary | ICD-10-CM

## 2017-10-09 DIAGNOSIS — I2584 Coronary atherosclerosis due to calcified coronary lesion: Secondary | ICD-10-CM

## 2017-10-09 DIAGNOSIS — E7800 Pure hypercholesterolemia, unspecified: Secondary | ICD-10-CM

## 2017-10-09 NOTE — Telephone Encounter (Signed)
   Primary Cardiologist: Skeet Latch, MD  Chart reviewed as part of pre-operative protocol coverage. Patient was contacted 10/09/2017 in reference to pre-operative risk assessment for pending surgery as outlined below.  Dorothy Frederick was last seen on 10/09/17 by Fabian Sharp PAC.  Dorothy Frederick is doing well from a cardiac perspective. Her HTN is controlled and she is working with her PCP for lipid control. She had a heart cath in 2015 following an abnormal stress test for chest pain. Heart cath with mild nonobstructive disease 10-20%. Chest pain is controlled with her GERD regimen. She can complete more than 4.0 METS. I have no reason to suspect a progression of her CAD. She is at acceptable risk for dental surgery without further cardiac testing.  Therefore, based on ACC/AHA guidelines, the patient would be at acceptable risk for the planned procedure without further cardiovascular testing.   I will route this recommendation to the requesting party via Epic fax function and remove from pre-op pool.  Please call with questions.  Tami Lin [place], PA 10/09/2017, 11:48 AM

## 2017-10-09 NOTE — Patient Instructions (Signed)
Medication Instructions:  Your physician recommends that you continue on your current medications as directed. Please refer to the Current Medication list given to you today.  If you need a refill on your cardiac medications before your next appointment, please call your pharmacy.  Labwork: None  [employer] in our office  Testing/Procedures: None   Follow-Up: Follow up as needed   Any Other Special Instructions Will Be Listed Below (If Applicable).

## 2017-10-13 NOTE — Addendum Note (Signed)
Addended by: Zebedee Iba on: 10/13/2017 03:37 PM   Modules accepted: Orders

## 2017-10-23 DIAGNOSIS — E039 Hypothyroidism, unspecified: Secondary | ICD-10-CM | POA: Diagnosis not present

## 2017-10-23 DIAGNOSIS — E782 Mixed hyperlipidemia: Secondary | ICD-10-CM | POA: Diagnosis not present

## 2017-10-23 DIAGNOSIS — E559 Vitamin D deficiency, unspecified: Secondary | ICD-10-CM | POA: Diagnosis not present

## 2017-10-29 DIAGNOSIS — E559 Vitamin D deficiency, unspecified: Secondary | ICD-10-CM | POA: Diagnosis not present

## 2017-10-29 DIAGNOSIS — J449 Chronic obstructive pulmonary disease, unspecified: Secondary | ICD-10-CM | POA: Diagnosis not present

## 2017-10-29 DIAGNOSIS — I10 Essential (primary) hypertension: Secondary | ICD-10-CM | POA: Diagnosis not present

## 2017-10-29 DIAGNOSIS — E782 Mixed hyperlipidemia: Secondary | ICD-10-CM | POA: Diagnosis not present

## 2017-10-29 DIAGNOSIS — Z23 Encounter for immunization: Secondary | ICD-10-CM | POA: Diagnosis not present

## 2017-12-01 DIAGNOSIS — J441 Chronic obstructive pulmonary disease with (acute) exacerbation: Secondary | ICD-10-CM | POA: Diagnosis not present

## 2017-12-01 DIAGNOSIS — Z6829 Body mass index (BMI) 29.0-29.9, adult: Secondary | ICD-10-CM | POA: Diagnosis not present

## 2017-12-01 DIAGNOSIS — K219 Gastro-esophageal reflux disease without esophagitis: Secondary | ICD-10-CM | POA: Diagnosis not present

## 2017-12-01 DIAGNOSIS — J449 Chronic obstructive pulmonary disease, unspecified: Secondary | ICD-10-CM | POA: Diagnosis not present

## 2017-12-05 DIAGNOSIS — J449 Chronic obstructive pulmonary disease, unspecified: Secondary | ICD-10-CM | POA: Diagnosis not present

## 2017-12-05 DIAGNOSIS — K219 Gastro-esophageal reflux disease without esophagitis: Secondary | ICD-10-CM | POA: Diagnosis not present

## 2018-03-02 DIAGNOSIS — R0602 Shortness of breath: Secondary | ICD-10-CM | POA: Diagnosis not present

## 2018-03-02 DIAGNOSIS — K219 Gastro-esophageal reflux disease without esophagitis: Secondary | ICD-10-CM | POA: Diagnosis not present

## 2018-03-02 DIAGNOSIS — E039 Hypothyroidism, unspecified: Secondary | ICD-10-CM | POA: Diagnosis not present

## 2018-03-02 DIAGNOSIS — J449 Chronic obstructive pulmonary disease, unspecified: Secondary | ICD-10-CM | POA: Diagnosis not present

## 2018-03-04 DIAGNOSIS — K219 Gastro-esophageal reflux disease without esophagitis: Secondary | ICD-10-CM | POA: Diagnosis not present

## 2018-03-04 DIAGNOSIS — R0602 Shortness of breath: Secondary | ICD-10-CM | POA: Diagnosis not present

## 2018-03-04 DIAGNOSIS — J449 Chronic obstructive pulmonary disease, unspecified: Secondary | ICD-10-CM | POA: Diagnosis not present

## 2018-03-04 DIAGNOSIS — I10 Essential (primary) hypertension: Secondary | ICD-10-CM | POA: Diagnosis not present

## 2018-03-11 DIAGNOSIS — R0602 Shortness of breath: Secondary | ICD-10-CM | POA: Diagnosis not present

## 2018-03-11 DIAGNOSIS — K219 Gastro-esophageal reflux disease without esophagitis: Secondary | ICD-10-CM | POA: Diagnosis not present

## 2018-03-11 DIAGNOSIS — J449 Chronic obstructive pulmonary disease, unspecified: Secondary | ICD-10-CM | POA: Diagnosis not present

## 2018-03-11 DIAGNOSIS — M791 Myalgia, unspecified site: Secondary | ICD-10-CM | POA: Diagnosis not present

## 2018-04-01 DIAGNOSIS — K219 Gastro-esophageal reflux disease without esophagitis: Secondary | ICD-10-CM | POA: Diagnosis not present

## 2018-04-01 DIAGNOSIS — J449 Chronic obstructive pulmonary disease, unspecified: Secondary | ICD-10-CM | POA: Diagnosis not present

## 2018-04-01 DIAGNOSIS — Z6830 Body mass index (BMI) 30.0-30.9, adult: Secondary | ICD-10-CM | POA: Diagnosis not present

## 2018-04-01 DIAGNOSIS — J309 Allergic rhinitis, unspecified: Secondary | ICD-10-CM | POA: Diagnosis not present

## 2018-04-24 DIAGNOSIS — I10 Essential (primary) hypertension: Secondary | ICD-10-CM | POA: Diagnosis not present

## 2018-04-24 DIAGNOSIS — M810 Age-related osteoporosis without current pathological fracture: Secondary | ICD-10-CM | POA: Diagnosis not present

## 2018-04-24 DIAGNOSIS — K219 Gastro-esophageal reflux disease without esophagitis: Secondary | ICD-10-CM | POA: Diagnosis not present

## 2018-04-24 DIAGNOSIS — E039 Hypothyroidism, unspecified: Secondary | ICD-10-CM | POA: Diagnosis not present

## 2018-07-06 DIAGNOSIS — Z853 Personal history of malignant neoplasm of breast: Secondary | ICD-10-CM | POA: Diagnosis not present

## 2018-07-06 DIAGNOSIS — Z1231 Encounter for screening mammogram for malignant neoplasm of breast: Secondary | ICD-10-CM | POA: Diagnosis not present

## 2018-07-08 DIAGNOSIS — R928 Other abnormal and inconclusive findings on diagnostic imaging of breast: Secondary | ICD-10-CM | POA: Diagnosis not present

## 2018-07-08 DIAGNOSIS — N6321 Unspecified lump in the left breast, upper outer quadrant: Secondary | ICD-10-CM | POA: Diagnosis not present

## 2018-07-08 DIAGNOSIS — Z0000 Encounter for general adult medical examination without abnormal findings: Secondary | ICD-10-CM | POA: Diagnosis not present

## 2018-07-13 ENCOUNTER — Other Ambulatory Visit: Payer: Self-pay | Admitting: Radiology

## 2018-07-13 DIAGNOSIS — N6321 Unspecified lump in the left breast, upper outer quadrant: Secondary | ICD-10-CM | POA: Diagnosis not present

## 2018-07-13 DIAGNOSIS — Z853 Personal history of malignant neoplasm of breast: Secondary | ICD-10-CM | POA: Diagnosis not present

## 2018-07-13 DIAGNOSIS — C50412 Malignant neoplasm of upper-outer quadrant of left female breast: Secondary | ICD-10-CM | POA: Diagnosis not present

## 2018-07-15 ENCOUNTER — Encounter: Payer: Self-pay | Admitting: *Deleted

## 2018-07-15 DIAGNOSIS — C50412 Malignant neoplasm of upper-outer quadrant of left female breast: Secondary | ICD-10-CM

## 2018-07-16 ENCOUNTER — Telehealth: Payer: Self-pay | Admitting: Oncology

## 2018-07-16 NOTE — Telephone Encounter (Signed)
Spoke to patient to confirm morning [facility] appointment for 7/1, packet emailed to patient

## 2018-07-21 NOTE — Progress Notes (Signed)
[place]  [phone] [phone]    ID: Dorothy Frederick DOB: 1955-02-05  MR#: [id number]  YYT#:[id number]  Patient Care Team: [name], MD as PCP - General (Family Medicine) Skeet Latch, MD as PCP - Cardiology (Cardiology) Mauro Kaufmann, RN as Oncology Nurse Navigator Rockwell Germany, RN as Oncology Nurse Navigator Fanny Skates, MD as Consulting Physician (General Surgery) Durga Saldarriaga, Virgie Dad, MD as Consulting Physician (Oncology) Gery Pray, MD as Consulting Physician (Radiation Oncology) Croitoru, Dani Gobble, MD as Consulting Physician (Cardiology) OTHER MD:   CHIEF COMPLAINT: Estrogen receptor negative breast cancer  CURRENT TREATMENT: Neoadjuvant chemotherapy and anti-HER-2 immunotherapy   HISTORY OF CURRENT ILLNESS: Dorothy Frederick has a history of left breast cancer in 2000, which was treated with a partial mastectomy followed by radiation by Dr. Matthew Saras in [facility]. She was also treated with Tamoxifen for 1.5 years, which she tolerated well, but had to discontinue due to financial difficulties. She did not receive chemotherapy. She notes that her cancer was diffuse and was lymph node negative.  More recently, she had routine screening mammography on 07/06/2018 showing: Breast Density Category B. On mammography, there is a 1.5 cm oval mass with associated calcifications in the left breast upper outer quadrant posterior depth 6.6 cm from the nipple. This mass is approximately 3 cm anterior to the lumpectomy site in the upper outer posterior left breast. No other significant masses, calcifications, or other findings are seen in either breast.   Accordingly on 07/13/2018 she proceeded to biopsy of the left breast area in question. The pathology from this procedure showed ([id number]): invasive ductal carcinoma, grade III, 2 o'clock, 5 cm from the nipple. Prognostic indicators significant for: estrogen receptor, 0% negative and  progesterone receptor, 0% negative. Proliferation marker Ki67 at 70%. HER2 positive (3+) by immunohistochemistry.  The patient's subsequent history is as detailed below.   INTERVAL HISTORY: Dorothy Frederick was evaluated in the multidisciplinary breast cancer clinic on 07/22/2018.  Her case was also presented at the multidisciplinary breast cancer conference on the same day. At that time a preliminary plan was proposed: Neoadjuvant chemoimmunotherapy, with anti-HER-2 treatment; likely mastectomy; likely no radiation, genetics testing   REVIEW OF SYSTEMS: Dorothy Frederick likes to walk for exercise; she usually walks 5 days per week for about 1 mile per walk. She is following appropriate precautions against the spread of COVID-19.   There were no specific symptoms leading to the original mammogram, which was routinely scheduled. The patient denies unusual headaches, visual changes, nausea, vomiting, stiff neck, dizziness, or gait imbalance. There has been no cough, phlegm production, or pleurisy, no chest pain or pressure, and no change in bowel or bladder habits. The patient denies fever, rash, bleeding, unexplained fatigue or unexplained weight loss. A detailed review of systems was otherwise entirely negative.   PAST MEDICAL HISTORY: Past Medical History:  Diagnosis Date   Angina at rest [facility])    Cancer Procedure [facility])    breast   COPD (chronic obstructive pulmonary disease) ([place])    Family history of breast cancer    Family history of esophageal cancer    Family history of lung cancer    Family history of prostate cancer    Hyperlipidemia    Hypertension    Hypothyroidism      PAST SURGICAL HISTORY: Past Surgical History:  Procedure Laterality Date   ADENOIDECTOMY     LEFT HEART CATHETERIZATION WITH CORONARY ANGIOGRAM N/A 10/21/2013   Procedure: LEFT HEART CATHETERIZATION WITH CORONARY ANGIOGRAM;  Surgeon: Harrell Gave  Santina Evans, MD;  Location: [facility];  Service: Cardiovascular;  Laterality:  N/A;   MASTECTOMY, PARTIAL Left 2000   TONSILLECTOMY       FAMILY HISTORY: Family History  Problem Relation Age of Onset   Heart disease Mother    Hypertension Father    Cancer Father    Prostate cancer Father    Hypertension Sister    Breast cancer Sister        dx 55s   Heart disease Sister    Hypertension Brother    Hyperlipidemia Maternal Grandmother    Stroke Maternal Grandmother    Hypertension Brother    Breast cancer Maternal Aunt    Lung cancer Maternal Aunt    Esophageal cancer Maternal Aunt    Stomach cancer Paternal Grandmother    Mitchell's father died from prostate cancer at age 13. Patients' mother died from heart complications at age 71. The patient has 2 half brothers and 2 half sisters. Patient denies anyone in her family having ovarian, prostate, or pancreatic cancer. 1 maternal aunt and 9 half aunt (half-sister of the patient's mother) had breast cancer breast cancer.    GYNECOLOGIC HISTORY:  No LMP recorded. Patient is postmenopausal. 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 She is divorced. She lives with her daughter and two grandchildren. Her daughter, Dorothy Frederick, is 52 and works for [employer] as a Clinical research associate.  Sarah's children Piper, 13, and Conner, 10 also live with the patient. Dorothy Frederick's other daughter, Dorothy Frederick, is a Licensed conveyancer at [employer]. Dorothy Frederick has 4 grandchildren in total.   ADVANCED DIRECTIVES:  Not in place. [place] was given the appropriate forms on 07/22/18 to fill out and return at their own discretion.     HEALTH MAINTENANCE: Social History   Tobacco Use   Smoking status: Former Smoker    Packs/day: 1.00    Years: 38.00    Pack years: 38.00    Types: Cigarettes   Smokeless tobacco: Never Used  Substance Use  Topics   Alcohol use: No   Drug use: No    Colonoscopy: 2017  PAP:    Bone density: yes, [facility]; osteoporosis Mammography: up to date  Allergies  Allergen Reactions   Benadryl [Diphenhydramine Hcl (Sleep)] Palpitations    Increased heartrate   Sulfa Antibiotics Rash    Current Outpatient Medications  Medication Sig Dispense Refill   albuterol (PROVENTIL HFA;VENTOLIN HFA) 108 (90 BASE) MCG/ACT inhaler Inhale 2 puffs into the lungs every 6 (six) hours as needed for wheezing or shortness of breath.     alendronate (FOSAMAX) 70 MG tablet Take 1 tablet by mouth once a week.     atorvastatin (LIPITOR) 10 MG tablet Take 40 mg by mouth daily.      Cholecalciferol 1000 UNITS capsule Take 500 Units by mouth daily.      escitalopram (LEXAPRO) 10 MG tablet Take 10 mg by mouth daily.  3   famotidine (PEPCID) 40 MG tablet Take 40 mg by mouth daily.     Fluticasone-Umeclidin-Vilant (TRELEGY ELLIPTA) 100-62.5-25 MCG/INH AEPB Inhale 1 puff into the lungs daily as needed.     levothyroxine (SYNTHROID, LEVOTHROID) 50 MCG tablet Take 1 tablet by mouth daily.     olmesartan (BENICAR) 20 MG tablet Take 20 mg by mouth daily.  11   psyllium (REGULOID) 0.52 g capsule Take 0.52 g by mouth daily.     omeprazole (PRILOSEC OTC) 20 MG tablet Take 1 tablet (20 mg total) by mouth daily. (Patient taking differently: Take 40 mg by mouth daily. ) 14 tablet 0   ranitidine (ZANTAC) 300 MG tablet Take 300 mg by mouth daily.  3   SYMBICORT 160-4.5 MCG/ACT inhaler Inhale 2 puffs into the lungs 2 (two) times daily.  11   No current facility-administered medications for this visit.      OBJECTIVE: Middle-aged white woman who appears older than stated age  62:   07/22/18 0840  BP: (!) 115/49  Pulse: 71  Resp: 18  Temp: 98.9 F (37.2 C)  SpO2: 99%     Body mass index is 29.7 kg/m.   Wt Readings from Last 3 Encounters:  07/22/18 173 lb (78.5 kg)  10/09/17 172 lb 9.6 oz (78.3 kg)  07/29/14  158 lb (71.7 kg)      ECOG FS:1 - Symptomatic but completely ambulatory  Ocular: Sclerae unicteric, pupils round and equal Ear-nose-throat: Oropharynx clear and moist Lymphatic: No cervical or supraclavicular adenopathy Lungs no rales or rhonchi Heart regular rate and rhythm Abd soft, nontender, positive bowel sounds MSK no focal spinal tenderness, no joint edema Neuro: non-focal, well-oriented, appropriate affect Breasts: I do not palpate a mass in the left breast.  There are no skin or nipple changes of concern.  The right breast is unremarkable.  Both axillae are benign.   LAB RESULTS:  CMP     Component Value Date/Time   NA 140 07/22/2018 0835   K 3.9 07/22/2018 0835   CL 108 07/22/2018 0835   CO2 21 (L) 07/22/2018 0835   GLUCOSE 97 07/22/2018 0835   BUN 14 07/22/2018 0835   CREATININE 1.11 (H) 07/22/2018 0835   CREATININE 0.97 10/18/2013 1527   CALCIUM 9.1 07/22/2018 0835   PROT 7.4 07/22/2018 0835   ALBUMIN 3.7 07/22/2018 0835   AST 17 07/22/2018 0835   ALT 20 07/22/2018 0835   ALKPHOS 99 07/22/2018 0835   BILITOT 0.6 07/22/2018 0835   GFRNONAA 53 (L) 07/22/2018 0835   GFRAA >60 07/22/2018 0835    No results found for: TOTALPROTELP, ALBUMINELP, A1GS, A2GS, BETS, BETA2SER, GAMS, MSPIKE, SPEI  No results found for: KPAFRELGTCHN, LAMBDASER, KAPLAMBRATIO  Lab Results  Component Value Date   WBC 4.4 07/22/2018   NEUTROABS 2.8 07/22/2018   HGB 12.3 07/22/2018   HCT 37.3 07/22/2018   MCV 87.4 07/22/2018   PLT 266 07/22/2018    _0 @  Lab Results  Component Value Date   LABCA2 24 09/08/2006    No components found for: ONGEXB284  No results for input(s): INR in the last 168 hours.  Lab Results  Component Value Date   LABCA2 24 09/08/2006    No results found for: XLK440  No results found for: NUU725  No results found for: DGU440  No results found for: CA2729  No components found for: HGQUANT  No results found for: CEA1 / No results  found for: CEA1   No results found for: AFPTUMOR  No results found for: Declo  No results found for: PSA1  Appointment on 07/22/2018  Component Date Value Ref Range Status   WBC Count 07/22/2018 4.4  4.0 - 10.5 K/uL Final   RBC 07/22/2018 4.27  3.87 - 5.11 MIL/uL Final   Hemoglobin 07/22/2018 12.3  12.0 - 15.0 g/dL Final   HCT 07/22/2018 37.3  36.0 - 46.0 % Final   MCV 07/22/2018 87.4  80.0 - 100.0 fL Final   MCH 07/22/2018 28.8  26.0 - 34.0 pg Final   MCHC 07/22/2018 33.0  30.0 - 36.0 g/dL Final   RDW 07/22/2018 13.0  11.5 - 15.5 % Final   Platelet Count 07/22/2018 266  150 - 400 K/uL Final   nRBC 07/22/2018 0.0  0.0 - 0.2 % Final   Neutrophils Relative % 07/22/2018 65  % Final   Neutro Abs 07/22/2018 2.8  1.7 - 7.7 K/uL Final   Lymphocytes Relative 07/22/2018 22  % Final   Lymphs Abs 07/22/2018 1.0  0.7 - 4.0 K/uL Final   Monocytes Relative 07/22/2018 9  % Final   Monocytes Absolute 07/22/2018 0.4  0.1 - 1.0 K/uL Final   Eosinophils Relative 07/22/2018 3  % Final   Eosinophils Absolute 07/22/2018 0.1  0.0 - 0.5 K/uL Final   Basophils Relative 07/22/2018 1  % Final   Basophils Absolute 07/22/2018 0.0  0.0 - 0.1 K/uL Final   Immature Granulocytes 07/22/2018 0  % Final   Abs Immature Granulocytes 07/22/2018 0.01  0.00 - 0.07 K/uL Final   Performed at [facility], [place] [address], [place], [place] [address]   Sodium 07/22/2018 140  135 - 145 mmol/L Final   Potassium 07/22/2018 3.9  3.5 - 5.1 mmol/L Final   Chloride 07/22/2018 108  98 - 111 mmol/L Final   CO2 07/22/2018 21* 22 - 32 mmol/L Final   Glucose, Bld 07/22/2018 97  70 - 99 mg/dL Final   BUN 07/22/2018 14  8 - 23 mg/dL Final   Creatinine 07/22/2018 1.11* 0.44 - 1.00 mg/dL Final   Calcium 07/22/2018 9.1  8.9 - 10.3 mg/dL Final   Total Protein 07/22/2018 7.4  6.5 - 8.1 g/dL Final   Albumin 07/22/2018 3.7  3.5 - 5.0 g/dL Final   AST 07/22/2018 17  15 - 41 U/L  Final   ALT 07/22/2018 20  0 - 44 U/L Final   Alkaline Phosphatase 07/22/2018 99  38 - 126 U/L Final   Total Bilirubin 07/22/2018 0.6  0.3 - 1.2 mg/dL Final   GFR, Est Non Af Am 07/22/2018 53* >60 mL/min Final   GFR, Est AFR Am 07/22/2018 >60  >60 mL/min Final   Anion gap 07/22/2018 11  5 - 15 Final   Performed at [facility], [place] [address], [place], [place] [address]    (this displays the last labs from the last 3 days)  No results found for: TOTALPROTELP, ALBUMINELP, A1GS, A2GS, BETS, BETA2SER, GAMS, MSPIKE, SPEI (this displays SPEP labs)  No results found for: KPAFRELGTCHN, LAMBDASER, KAPLAMBRATIO (kappa/lambda light chains)  No results found for: HGBA, HGBA2QUANT, HGBFQUANT, HGBSQUAN (Hemoglobinopathy evaluation)   No results found for: LDH  No results found for: IRON, TIBC, IRONPCTSAT (Iron and TIBC)  No results found for: FERRITIN  Urinalysis No results found for: COLORURINE, APPEARANCEUR, LABSPEC, PHURINE, GLUCOSEU, HGBUR, BILIRUBINUR, KETONESUR, PROTEINUR, UROBILINOGEN, NITRITE, LEUKOCYTESUR   STUDIES:  No results found.   ELIGIBLE FOR AVAILABLE RESEARCH PROTOCOL: no   ASSESSMENT: 63 y.o. [place], [place] woman status post left breast upper outer quadrant biopsy 07/13/2018 for a clinical T1c N0, stage IA invasive ductal carcinoma, grade 3, estrogen and progesterone receptor negative, HER-2 amplified, with an MIB-1 of 70%  (1) definitive surgery, likely mastectomy with sentinel lymph node sampling  (2) adjuvant chemotherapy to consist of paclitaxel and trastuzumab weekly x12  (3)  trastuzumab to be continued every 21 days to total 1 year  (4) likely no postmastectomy radiation  (5) genetics testing   PLAN: I spent approximately 60 minutes face to face with Dorothy Frederick with more than 50% of that time spent in counseling and coordination of care. Specifically we reviewed the biology of the patient's diagnosis and the specifics of her  situation. We first reviewed the fact that cancer is not one disease but more than 100 different diseases and that it is important to keep them separate-- otherwise when friends and relatives discuss their own cancer experiences with Dorothy Frederick confusion can result. Similarly we explained that if breast cancer spreads to the bone or liver, the patient would not have bone cancer or liver cancer, but breast cancer in the bone and breast cancer in the liver: one cancer in three places-- not 3 different cancers which otherwise would have to be treated in 3 different ways.  We discussed the difference between local and systemic therapy. In terms of loco-regional treatment, lumpectomy plus radiation is equivalent to mastectomy as far as survival is concerned. For this reason, and because the cosmetic results are generally superior, we recommend breast conserving surgery.   We then discussed the rationale for systemic therapy. There is some risk that this cancer may have already spread to other parts of her body. Patients frequently ask at this point about bone scans, CAT scans and PET scans to find out if they have occult breast cancer somewhere else. The problem is that in early stage disease we are much more likely to find false positives then true cancers and this would expose the patient to unnecessary procedures as well as unnecessary radiation. Scans cannot answer the question the patient really would like to know, which is whether she has microscopic disease elsewhere in her body. For those reasons we do not recommend them.  Of course we would proceed to aggressive evaluation of any symptoms that might suggest metastatic disease, but that is not the case here.  Next we went over the options for systemic therapy which are anti-estrogens, anti-HER-2 immunotherapy, and chemotherapy. Dorothy Frederick does not meet criteria for anti-estrogens.  She is a good candidate for anti-HER-2 immunotherapy and chemotherapy  In early  stage HER-2 positive tumors like Dorothy Frederick we generally limit the chemotherapy to paclitaxel weekly x12.  Trastuzumab will be given weekly with the paclitaxel and then every 3 weeks to complete a year  Dorothy Frederick does qualify for genetics testing. In patients who carry a deleterious mutation [for example in a  BRCA gene], the risk of a new breast cancer developing in the future may be sufficiently great that the patient may choose bilateral mastectomies. However if she wishes to keep her breasts in that situation it is safe to do so. That would require intensified screening, which generally means not only yearly mammography but a yearly breast MRI as well.   Dorothy Frederick has a good understanding of the overall plan. She agrees with it. She knows the goal of treatment in her case is cure. She will call with any problems that may develop before her next visit here.    Dorothy Frederick, Virgie Dad, MD  07/22/18 1:03 PM Medical Oncology and Hematology [facility] [address] [place], [place] [address] Tel. [phone]    Fax. [phone]   I, Jacqualyn Posey am acting as a Education administrator for Chauncey Cruel, MD.   I, Lurline Del MD, have reviewed the above documentation for accuracy and completeness, and  I agree with the above.

## 2018-07-22 ENCOUNTER — Ambulatory Visit: Payer: BC Managed Care – PPO | Attending: General Surgery | Admitting: Physical Therapy

## 2018-07-22 ENCOUNTER — Other Ambulatory Visit: Payer: Self-pay | Admitting: *Deleted

## 2018-07-22 ENCOUNTER — Encounter: Payer: Self-pay | Admitting: Oncology

## 2018-07-22 ENCOUNTER — Inpatient Hospital Stay: Payer: BC Managed Care – PPO | Attending: Oncology | Admitting: Oncology

## 2018-07-22 ENCOUNTER — Other Ambulatory Visit: Payer: Self-pay

## 2018-07-22 ENCOUNTER — Inpatient Hospital Stay: Payer: BC Managed Care – PPO

## 2018-07-22 ENCOUNTER — Ambulatory Visit
Admission: RE | Admit: 2018-07-22 | Discharge: 2018-07-22 | Disposition: A | Discharge status: Home / Self Care | Payer: BC Managed Care – PPO | Source: Ambulatory Visit | Attending: Radiation Oncology | Admitting: Radiation Oncology

## 2018-07-22 ENCOUNTER — Ambulatory Visit (HOSPITAL_BASED_OUTPATIENT_CLINIC_OR_DEPARTMENT_OTHER): Payer: BC Managed Care – PPO | Admitting: Licensed Clinical Social Worker

## 2018-07-22 ENCOUNTER — Encounter: Payer: Self-pay | Admitting: Physical Therapy

## 2018-07-22 ENCOUNTER — Encounter: Payer: Self-pay | Admitting: Licensed Clinical Social Worker

## 2018-07-22 VITALS — BP 115/49 | HR 71 | Temp 98.9°F | Resp 18 | Ht 64.0 in | Wt 173.0 lb

## 2018-07-22 DIAGNOSIS — R293 Abnormal posture: Secondary | ICD-10-CM | POA: Diagnosis not present

## 2018-07-22 DIAGNOSIS — Z79899 Other long term (current) drug therapy: Secondary | ICD-10-CM | POA: Insufficient documentation

## 2018-07-22 DIAGNOSIS — Z7951 Long term (current) use of inhaled steroids: Secondary | ICD-10-CM | POA: Insufficient documentation

## 2018-07-22 DIAGNOSIS — I2510 Atherosclerotic heart disease of native coronary artery without angina pectoris: Secondary | ICD-10-CM | POA: Diagnosis not present

## 2018-07-22 DIAGNOSIS — I890 Lymphedema, not elsewhere classified: Secondary | ICD-10-CM | POA: Diagnosis not present

## 2018-07-22 DIAGNOSIS — Z800 Family history of malignant neoplasm of digestive organs: Secondary | ICD-10-CM | POA: Insufficient documentation

## 2018-07-22 DIAGNOSIS — J449 Chronic obstructive pulmonary disease, unspecified: Secondary | ICD-10-CM | POA: Diagnosis not present

## 2018-07-22 DIAGNOSIS — E039 Hypothyroidism, unspecified: Secondary | ICD-10-CM | POA: Diagnosis not present

## 2018-07-22 DIAGNOSIS — I158 Other secondary hypertension: Secondary | ICD-10-CM

## 2018-07-22 DIAGNOSIS — Z8042 Family history of malignant neoplasm of prostate: Secondary | ICD-10-CM | POA: Insufficient documentation

## 2018-07-22 DIAGNOSIS — Z171 Estrogen receptor negative status [ER-]: Secondary | ICD-10-CM | POA: Diagnosis not present

## 2018-07-22 DIAGNOSIS — C50412 Malignant neoplasm of upper-outer quadrant of left female breast: Secondary | ICD-10-CM | POA: Diagnosis not present

## 2018-07-22 DIAGNOSIS — Z87891 Personal history of nicotine dependence: Secondary | ICD-10-CM | POA: Insufficient documentation

## 2018-07-22 DIAGNOSIS — E785 Hyperlipidemia, unspecified: Secondary | ICD-10-CM | POA: Diagnosis not present

## 2018-07-22 DIAGNOSIS — I10 Essential (primary) hypertension: Secondary | ICD-10-CM | POA: Diagnosis not present

## 2018-07-22 DIAGNOSIS — Z803 Family history of malignant neoplasm of breast: Secondary | ICD-10-CM | POA: Diagnosis not present

## 2018-07-22 DIAGNOSIS — Z923 Personal history of irradiation: Secondary | ICD-10-CM | POA: Insufficient documentation

## 2018-07-22 DIAGNOSIS — Z170 Estrogen receptor positive status [ER+]: Secondary | ICD-10-CM | POA: Insufficient documentation

## 2018-07-22 DIAGNOSIS — Z808 Family history of malignant neoplasm of other organs or systems: Secondary | ICD-10-CM

## 2018-07-22 DIAGNOSIS — Z9012 Acquired absence of left breast and nipple: Secondary | ICD-10-CM | POA: Insufficient documentation

## 2018-07-22 DIAGNOSIS — Z853 Personal history of malignant neoplasm of breast: Secondary | ICD-10-CM | POA: Diagnosis not present

## 2018-07-22 DIAGNOSIS — Z801 Family history of malignant neoplasm of trachea, bronchus and lung: Secondary | ICD-10-CM

## 2018-07-22 LAB — CBC WITH DIFFERENTIAL (CANCER CENTER ONLY)
Abs Immature Granulocytes: 0.01 10*3/uL (ref 0.00–0.07)
Basophils Absolute: 0 10*3/uL (ref 0.0–0.1)
Basophils Relative: 1 %
Eosinophils Absolute: 0.1 10*3/uL (ref 0.0–0.5)
Eosinophils Relative: 3 %
HCT: 37.3 % (ref 36.0–46.0)
Hemoglobin: 12.3 g/dL (ref 12.0–15.0)
Immature Granulocytes: 0 %
Lymphocytes Relative: 22 %
Lymphs Abs: 1 10*3/uL (ref 0.7–4.0)
MCH: 28.8 pg (ref 26.0–34.0)
MCHC: 33 g/dL (ref 30.0–36.0)
MCV: 87.4 fL (ref 80.0–100.0)
Monocytes Absolute: 0.4 10*3/uL (ref 0.1–1.0)
Monocytes Relative: 9 %
Neutro Abs: 2.8 10*3/uL (ref 1.7–7.7)
Neutrophils Relative %: 65 %
Platelet Count: 266 10*3/uL (ref 150–400)
RBC: 4.27 MIL/uL (ref 3.87–5.11)
RDW: 13 % (ref 11.5–15.5)
WBC Count: 4.4 10*3/uL (ref 4.0–10.5)
nRBC: 0 % (ref 0.0–0.2)

## 2018-07-22 LAB — CMP (CANCER CENTER ONLY)
ALT: 20 U/L (ref 0–44)
AST: 17 U/L (ref 15–41)
Albumin: 3.7 g/dL (ref 3.5–5.0)
Alkaline Phosphatase: 99 U/L (ref 38–126)
Anion gap: 11 (ref 5–15)
BUN: 14 mg/dL (ref 8–23)
CO2: 21 mmol/L — ABNORMAL LOW (ref 22–32)
Calcium: 9.1 mg/dL (ref 8.9–10.3)
Chloride: 108 mmol/L (ref 98–111)
Creatinine: 1.11 mg/dL — ABNORMAL HIGH (ref 0.44–1.00)
GFR, Est AFR Am: >60 mL/min (ref >60)
GFR, Estimated: 53 mL/min — ABNORMAL LOW (ref >60)
Glucose, Bld: 97 mg/dL (ref 70–99)
Potassium: 3.9 mmol/L (ref 3.5–5.1)
Sodium: 140 mmol/L (ref 135–145)
Total Bilirubin: 0.6 mg/dL (ref 0.3–1.2)
Total Protein: 7.4 g/dL (ref 6.5–8.1)

## 2018-07-22 NOTE — Progress Notes (Signed)
REFERRING PROVIDER: Chauncey Cruel, MD [address] [place],  [place] [address]  PRIMARY PROVIDER:  [name], MD  PRIMARY REASON FOR VISIT:  1. Malignant neoplasm of upper-outer quadrant of left breast in female, estrogen receptor negative ([place])   2. Family history of breast cancer   3. Family history of prostate cancer   4. Family history of lung cancer   5. Family history of esophageal cancer     I connected with Ms. Bart on 07/22/2018 at 10:45 AM EDT by Webex and verified that I am speaking with the correct person using two identifiers.    Patient location: clinic Provider location: clinic  HISTORY OF PRESENT ILLNESS:   Ms. Dorothy Frederick, a 63 y.o. female, was seen for a [facility] cancer genetics consultation at the request of Dr. Jana Hakim due to a personal and family history of cancer.  Ms. Hight presents to clinic today to discuss the possibility of a hereditary predisposition to cancer, genetic testing, and to further clarify her future cancer risks, as well as potential cancer risks for family members.   In 2000, at the age of 10, Dorothy Frederick was diagnosed with left breast cancer. This was treated with partial mastectomy and radiation.   In 2020, at the age of 30, Dorothy Frederick was diagnosed with IDC of the left breast, ER-, PR-, Her2+. The current treatment plan includes surgery and chemotherapy.   CANCER HISTORY:  Oncology History   No history exists.    RISK FACTORS:  Menarche was at age 64.  First live birth at age 75.  OCP use for approximately 7 years.  Ovaries intact: yes.  Hysterectomy: no.  Menopausal status: postmenopausal.  HRT use: 0 years.   Past Medical History:  Diagnosis Date  . Angina at rest [facility])   . Cancer ([place])    breast  . COPD (chronic obstructive pulmonary disease) ([place])   . Family history of breast cancer   . Family history of esophageal cancer   . Family history of lung cancer   . Family history of prostate  cancer   . Hyperlipidemia   . Hypertension   . Hypothyroidism     Past Surgical History:  Procedure Laterality Date  . ADENOIDECTOMY    . LEFT HEART CATHETERIZATION WITH CORONARY ANGIOGRAM N/A 10/21/2013   Procedure: LEFT HEART CATHETERIZATION WITH CORONARY ANGIOGRAM;  Surgeon: Burnell Blanks, MD;  Location: [facility] CATH LAB;  Service: Cardiovascular;  Laterality: N/A;  . MASTECTOMY, PARTIAL Left 2000  . TONSILLECTOMY      Social History   Socioeconomic History  . Marital status: Divorced    Spouse name: Not on file  . Number of children: Not on file  . Years of education: Not on file  . Highest education level: Not on file  Occupational History  . Not on file  Social Needs  . Financial resource strain: Not on file  . Food insecurity    Worry: Not on file    Inability: Not on file  . Transportation needs    Medical: Not on file    Non-medical: Not on file  Tobacco Use  . Smoking status: Former Smoker    Packs/day: 1.00    Years: 38.00    Pack years: 38.00    Types: Cigarettes  . Smokeless tobacco: Never Used  Substance and Sexual Activity  . Alcohol use: No  . Drug use: No  . Sexual activity: Not on file  Lifestyle  . Physical activity  Days per week: Not on file    Minutes per session: Not on file  . Stress: Not on file  Relationships  . Social Herbalist on phone: Not on file    Gets together: Not on file    Attends religious service: Not on file    Active member of club or organization: Not on file    Attends meetings of clubs or organizations: Not on file    Relationship status: Not on file  Other Topics Concern  . Not on file  Social History Narrative  . Not on file     FAMILY HISTORY:  We obtained a detailed, 4-generation family history.  Significant diagnoses are listed below: Family History  Problem Relation Age of Onset  . Heart disease Mother   . Hypertension Father   . Cancer Father   . Prostate cancer Father   .  Hypertension Sister   . Breast cancer Sister        dx 44s  . Heart disease Sister   . Hypertension Brother   . Hyperlipidemia Maternal Grandmother   . Stroke Maternal Grandmother   . Hypertension Brother   . Breast cancer Maternal Aunt   . Lung cancer Maternal Aunt   . Esophageal cancer Maternal Aunt   . Stomach cancer Paternal Grandmother    Dorothy Frederick has two daughters. She has 2 maternal half sisters and 2 maternal half brothers. One of her half sisters had breast cancer diagnosed in her 73s and is living at 61. Patient says this sister had genetic testing that was negative.  Dorothy Frederick's mother died at 44. The patient had 8 maternal aunts, some of which were half aunts. One of her full aunts had breast cancer in her 55s, and another full aunt had lung cancer. One of her half aunts had esophageal cancer. No known cancers in her maternal cousins. Her maternal grandmother died in her 59s, grandfather died in his 48s.  Dorothy Frederick's father died at 55 and had prostate cancer. She does not know of any paternal aunts/uncles. Her paternal grandmother died of stomach cancer that was diagnosed at age 53. No cancers for her paternal grandfather.  Dorothy Frederick is aware of previous family history of genetic testing for hereditary cancer risks.  There is no reported Ashkenazi Jewish ancestry. There is no known consanguinity.  GENETIC COUNSELING ASSESSMENT: Dorothy Frederick is a 63 y.o. female with a personal and family history which is somewhat suggestive of a hereditary cancer syndrome and predisposition to cancer. We, therefore, discussed and recommended the following at today's visit.   DISCUSSION: We discussed that 5 - 10% of breast cancer is hereditary, with most cases associated with the BRCA1/BRCA2 genes.  There are other genes that can be associated with hereditary breast cancer syndromes.  These include PALB2, CHEK2, ATM, etc.    We reviewed the characteristics, features and inheritance  patterns of hereditary cancer syndromes. We also discussed genetic testing, including the appropriate family members to test, the process of testing, insurance coverage and turn-around-time for results. We discussed the implications of a negative, positive and/or variant of uncertain significant result. We recommended Dorothy Frederick pursue genetic testing for the [facility] Breast Cancer STAT + Common Hereditary Cancers gene panel.  The STAT Breast cancer panel offered by [facility] includes sequencing and rearrangement analysis for the following 9 genes:  ATM, BRCA1, BRCA2, CDH1, CHEK2, PALB2, PTEN, STK11 and TP53.    The Common Hereditary Cancers Panel offered by [facility]  includes sequencing and/or deletion duplication testing of the following 48 genes: APC, ATM, AXIN2, BARD1, BMPR1A, BRCA1, BRCA2, BRIP1, CDH1, CDKN2A (p14ARF), CDKN2A (p16INK4a), CKD4, CHEK2, CTNNA1, DICER1, EPCAM (Deletion/duplication testing only), GREM1 (promoter region deletion/duplication testing only), KIT, MEN1, MLH1, MSH2, MSH3, MSH6, MUTYH, NBN, NF1, NHTL1, PALB2, PDGFRA, PMS2, POLD1, POLE, PTEN, RAD50, RAD51C, RAD51D, RNF43, SDHB, SDHC, SDHD, SMAD4, SMARCA4. STK11, TP53, TSC1, TSC2, and VHL.  The following genes were evaluated for sequence changes only: SDHA and HOXB13 c.251G>A variant only.   Based on Ms. Frakes's personal and family history of cancer, she meets medical criteria for genetic testing. Despite that she meets criteria, she may still have an out of pocket cost.   PLAN: After considering the risks, benefits, and limitations, Dorothy Frederick provided informed consent to pursue genetic testing and the blood sample was sent to [facility] for analysis of the STAT Breast Cancer Panel + Common Hereditary Cancers Panel. Initial results should be available within approximately 1 weeks' time, at which point they will be disclosed by telephone to Dorothy Frederick, as will any additional recommendations warranted by these results.  Dorothy Frederick will receive a summary of her genetic counseling visit and a copy of her results once available. This information will also be available in Epic.   Lastly, we encouraged Dorothy Frederick to remain in contact with cancer genetics annually so that we can continuously update the family history and inform her of any changes in cancer genetics and testing that may be of benefit for this family.   Dorothy Frederick's questions were answered to her satisfaction today. Our contact information was provided should additional questions or concerns arise. Thank you for the referral and allowing [place] to share in the care of your patient.   Faith Rogue, MS Genetic Counselor [email or web address] Phone: [phone]  The patient was seen for a total of 15 minutes in virtual genetic counseling.  Drs. Magrinat, Lindi Adie and/or Burr Medico were available for discussion regarding this case.

## 2018-07-22 NOTE — Patient Instructions (Signed)

## 2018-07-22 NOTE — Therapy (Signed)
[place], [place], [address] Phone: [phone]   Fax:  [phone]  Physical Therapy Evaluation  Patient Details  Name: Dorothy Frederick MRN: [id number] Date of Birth: 1955-10-14 Referring Provider (PT): Dr. Fanny Skates   Encounter Date: 07/22/2018  PT End of Session - 07/22/18 1215    Visit Number  1    Number of Visits  2    Date for PT Re-Evaluation  09/16/18    PT Start Time  0951    PT Stop Time  1012    PT Time Calculation (min)  21 min    Activity Tolerance  Patient tolerated treatment well    Behavior During Therapy  [facility] for tasks assessed/performed       Past Medical History:  Diagnosis Date  . Angina at rest [facility])   . Cancer ([place])    breast  . COPD (chronic obstructive pulmonary disease) ([place])   . Family history of breast cancer   . Family history of esophageal cancer   . Family history of lung cancer   . Family history of prostate cancer   . Hyperlipidemia   . Hypertension   . Hypothyroidism     Past Surgical History:  Procedure Laterality Date  . ADENOIDECTOMY    . LEFT HEART CATHETERIZATION WITH CORONARY ANGIOGRAM N/A 10/21/2013   Procedure: LEFT HEART CATHETERIZATION WITH CORONARY ANGIOGRAM;  Surgeon: Burnell Blanks, MD;  Location: [facility] CATH LAB;  Service: Cardiovascular;  Laterality: N/A;  . MASTECTOMY, PARTIAL Left 2000  . TONSILLECTOMY      There were no vitals filed for this visit.   Subjective Assessment - 07/22/18 1208    Subjective  Patient reports she is here today to be seen by her medical team for her newly diagnosed left breast cancer.    Pertinent History  Patient was diagnosed on 07/06/2018 with left grade III invasive ductal carcinoma breast cancer. It measures 1.3 cm and is located in the upper outer quadrant. It is ER/PR negative and HER2 positive. She has a history of left breast cancer in 2000 when she had a lumpectomy, sentinel node biopsy, and  radiation. She reported 1 axillary node was removed. She has had mild left arm lymphedema but reports it has resolved. She has COPD.    Patient Stated Goals  reduce lymphedema risk and learn post op shoulder ROM HEP    Currently in Pain?  No/denies         [facility] PT Assessment - 07/22/18 0001      Assessment   Medical Diagnosis  Left breast cancer    Referring Provider (PT)  Dr. Fanny Skates    Onset Date/Surgical Date  07/06/18    Hand Dominance  Right    Prior Therapy  none      Precautions   Precautions  Other (comment)    Precaution Comments  active cancer      Restrictions   Weight Bearing Restrictions  No      Balance Screen   Has the patient fallen in the past 6 months  No    Has the patient had a decrease in activity level because of a fear of falling?   No    Is the patient reluctant to leave their home because of a fear of falling?   No      Home Environment   Living Environment  Private residence    Living Arrangements  Children   42 y.o. daughter and  50 & 85 y.o. grandkids   Available Help at Discharge  Family      Prior Function   Level of Independence  Independent    Vocation  Full time employment    Herbalist at [employer]  She walks 1 mile per day but it take 30-45 min due to her COPD      Cognition   Overall Cognitive Status  Within Functional Limits for tasks assessed      Posture/Postural Control   Posture/Postural Control  Postural limitations    Postural Limitations  Rounded Shoulders;Forward head;Increased thoracic kyphosis      ROM / Strength   AROM / PROM / Strength  AROM;Strength      AROM   Overall AROM Comments  Cervical AROM was WNL    AROM Assessment Site  Shoulder    Right/Left Shoulder  Right;Left    Right Shoulder Extension  42 Degrees    Right Shoulder Flexion  147 Degrees    Right Shoulder ABduction  153 Degrees    Right Shoulder Internal Rotation  66 Degrees    Right Shoulder  External Rotation  88 Degrees    Left Shoulder Extension  47 Degrees    Left Shoulder Flexion  135 Degrees    Left Shoulder ABduction  156 Degrees    Left Shoulder Internal Rotation  70 Degrees    Left Shoulder External Rotation  72 Degrees      Strength   Overall Strength  Within functional limits for tasks performed        LYMPHEDEMA/ONCOLOGY QUESTIONNAIRE - 07/22/18 1214      Type   Cancer Type  Left breast      Lymphedema Assessments   Lymphedema Assessments  Upper extremities      Right Upper Extremity Lymphedema   10 cm Proximal to Olecranon Process  27.9 cm    Olecranon Process  20.4 cm    10 cm Proximal to Ulnar Styloid Process  20 cm    Just Proximal to Ulnar Styloid Process  15.5 cm    Across Hand at [employer]  18 cm    At [place] of 2nd Digit  6.1 cm      Left Upper Extremity Lymphedema   10 cm Proximal to Olecranon Process  28.1 cm    Olecranon Process  23.3 cm    10 cm Proximal to Ulnar Styloid Process  20.8 cm    Just Proximal to Ulnar Styloid Process  15.3 cm    Across Hand at [employer]  18.3 cm    At [place] of 2nd Digit  6.2 cm             Objective measurements completed on examination: See above findings.       Patient was instructed today in a home exercise program today for post op shoulder range of motion. These included active assist shoulder flexion in sitting, scapular retraction, wall walking with shoulder abduction, and hands behind head external rotation.  She was encouraged to do these twice a day, holding 3 seconds and repeating 5 times when permitted by her physician.     PT Education - 07/22/18 1214    Education Details  Lymphedema risk reduction and post op shoulder ROM HEP    Person(s) Educated  Patient    Methods  Explanation;Demonstration;Handout    Comprehension  Returned demonstration;Verbalized understanding          PT  Long Term Goals - 07/22/18 1243      PT LONG TERM GOAL #1   Title  Patient will  demonstrate she has regained full shoulder ROM and function post operatively compared to baselines.    Time  [facility] Goals - 07/22/18 1243      Patient will be able to verbalize understanding of pertinent lymphedema risk reduction practices relevant to her diagnosis specifically related to skin care.   Time  1    Period  Days    Status  Achieved      Patient will be able to return demonstrate and/or verbalize understanding of the post-op home exercise program related to regaining shoulder range of motion.   Time  1    Period  Days    Status  Achieved      Patient will be able to verbalize understanding of the importance of attending the postoperative After Breast Cancer Class for further lymphedema risk reduction education and therapeutic exercise.   Time  1    Period  Days    Status  Achieved            Plan - 07/22/18 1216    Clinical Impression Statement  Patient was diagnosed on 07/06/2018 with left grade III invasive ductal carcinoma breast cancer. It measures 1.3 cm and is located in the upper outer quadrant. It is ER/PR negative and HER2 positive. She has a history of left breast cancer in 2000 when she had a lumpectomy, sentinel node biopsy, and radiation. She reported 1 axillary node was removed. She has had mild left arm lymphedema but reports it has resolved. She has COPD. Her multidisciplinary medical team met prior to her assessments to determine a recommended treatment plan. She is planning to have a left mastectomy and sentinel node biopsy followed by chemotherapy.    Stability/Clinical Decision Making  Stable/Uncomplicated    Clinical Decision Making  Low    Rehab Potential  Excellent    PT Frequency  --   Eval and 1 f/u visit   PT Treatment/Interventions  ADLs/Self Care Home Management;Therapeutic exercise;Patient/family education    PT Next Visit Plan  Will reassess 3-4 weeks post op to determine needs    PT Home  Exercise Plan  Post op shoulder ROM HEP    Consulted and Agree with Plan of Care  Patient       Patient will benefit from skilled therapeutic intervention in order to improve the following deficits and impairments:  Pain, Impaired UE functional use, Decreased knowledge of precautions, Postural dysfunction, Decreased range of motion  Visit Diagnosis: 1. Malignant neoplasm of upper-outer quadrant of left breast in female, estrogen receptor positive ([place])   2. Abnormal posture   3. Lymphedema, not elsewhere classified    Patient will follow up at outpatient cancer rehab 3-4 weeks following surgery.  If the patient requires physical therapy at that time, a specific plan will be dictated and sent to the referring physician for approval. The patient was educated today on appropriate basic range of motion exercises to begin post operatively and the importance of attending the After Breast Cancer class following surgery.  Patient was educated today on lymphedema risk reduction practices as it pertains to recommendations that will benefit the patient immediately following surgery.  She verbalized good understanding.       Problem List Patient Active Problem List   Diagnosis  Date Noted  . Family history of breast cancer   . Family history of prostate cancer   . Family history of lung cancer   . Family history of esophageal cancer   . Malignant neoplasm of upper-outer quadrant of left breast in female, estrogen receptor negative ([place]) 07/15/2018  . Coronary atherosclerosis due to calcified coronary lesion 07/30/2014  . HTN (hypertension) 11/08/2013  . S/P cardiac cath 11/08/2013  . Abnormal nuclear stress test 10/16/2013  . Abnormal ECG 09/15/2013  . History of breast cancer, left, status post partial mastectomy and XRT 09/15/2013  . Hypercholesterolemia 09/15/2013  . History of tobacco use 09/15/2013  . Exertional dyspnea 09/15/2013   Annia Friendly, PT 07/22/18 12:48 PM  [place], [place], [address] Phone: [phone]   Fax:  [phone]  Name: Dorothy Frederick MRN: [id number] Date of Birth: Jun 09, 1955

## 2018-07-23 ENCOUNTER — Telehealth: Payer: Self-pay | Admitting: Oncology

## 2018-07-23 NOTE — Telephone Encounter (Signed)
I talk with patient regarding schedule  

## 2018-07-27 ENCOUNTER — Other Ambulatory Visit: Payer: Self-pay

## 2018-07-27 ENCOUNTER — Ambulatory Visit (HOSPITAL_COMMUNITY)
Admission: RE | Admit: 2018-07-27 | Discharge: 2018-07-27 | Disposition: A | Discharge status: Home / Self Care | Payer: BC Managed Care – PPO | Source: Ambulatory Visit | Attending: Oncology | Admitting: Oncology

## 2018-07-27 ENCOUNTER — Encounter: Payer: Self-pay | Admitting: *Deleted

## 2018-07-27 DIAGNOSIS — Z9012 Acquired absence of left breast and nipple: Secondary | ICD-10-CM | POA: Diagnosis not present

## 2018-07-27 DIAGNOSIS — I10 Essential (primary) hypertension: Secondary | ICD-10-CM | POA: Insufficient documentation

## 2018-07-27 DIAGNOSIS — Z171 Estrogen receptor negative status [ER-]: Secondary | ICD-10-CM

## 2018-07-27 DIAGNOSIS — C50412 Malignant neoplasm of upper-outer quadrant of left female breast: Secondary | ICD-10-CM | POA: Diagnosis not present

## 2018-07-27 DIAGNOSIS — Z87891 Personal history of nicotine dependence: Secondary | ICD-10-CM | POA: Diagnosis not present

## 2018-07-27 DIAGNOSIS — E785 Hyperlipidemia, unspecified: Secondary | ICD-10-CM | POA: Insufficient documentation

## 2018-07-27 NOTE — Progress Notes (Signed)
Echocardiogram 2D Echocardiogram has been performed.  Oneal Deputy Senior 07/27/2018, 11:02 AM

## 2018-07-27 NOTE — Progress Notes (Signed)
[place] Work  Holiday representative contacted patient at home after [facility] to offer support and assess for needs.  Patient stated she was doing "well" and had not needs or concerns at this time.  CSW provided education on the support team and support programs at [facility].   CSW provided contact information and encouraged patient to call with questions or concerns.    Johnnye Lana, MSW, LCSW, OSW-C Clinical Social Worker [facility] [phone]

## 2018-07-30 ENCOUNTER — Ambulatory Visit: Payer: Self-pay | Admitting: Licensed Clinical Social Worker

## 2018-07-30 ENCOUNTER — Encounter: Payer: Self-pay | Admitting: Licensed Clinical Social Worker

## 2018-07-30 ENCOUNTER — Telehealth: Payer: Self-pay | Admitting: Licensed Clinical Social Worker

## 2018-07-30 ENCOUNTER — Telehealth: Payer: Self-pay | Admitting: *Deleted

## 2018-07-30 DIAGNOSIS — Z171 Estrogen receptor negative status [ER-]: Secondary | ICD-10-CM

## 2018-07-30 DIAGNOSIS — C50412 Malignant neoplasm of upper-outer quadrant of left female breast: Secondary | ICD-10-CM

## 2018-07-30 DIAGNOSIS — Z1379 Encounter for other screening for genetic and chromosomal anomalies: Secondary | ICD-10-CM

## 2018-07-30 DIAGNOSIS — Z923 Personal history of irradiation: Secondary | ICD-10-CM | POA: Diagnosis not present

## 2018-07-30 DIAGNOSIS — Z800 Family history of malignant neoplasm of digestive organs: Secondary | ICD-10-CM

## 2018-07-30 DIAGNOSIS — Z8042 Family history of malignant neoplasm of prostate: Secondary | ICD-10-CM

## 2018-07-30 DIAGNOSIS — Z801 Family history of malignant neoplasm of trachea, bronchus and lung: Secondary | ICD-10-CM

## 2018-07-30 DIAGNOSIS — Z803 Family history of malignant neoplasm of breast: Secondary | ICD-10-CM

## 2018-07-30 NOTE — Telephone Encounter (Signed)
Spoke to pt concerning [place] from 7.1.20. Denies questions or concerns regarding dx or treatment care plan. Encourage pt to call with needs. Received verbal understanding.  Sent msg to care team informing of negative genetic testing results.

## 2018-07-30 NOTE — Progress Notes (Signed)
HPI:  Dorothy Frederick was previously seen in the [facility] due to a personal and family history of breast cancer and concerns regarding a hereditary predisposition to cancer. Please refer to our prior cancer genetics clinic note for more information regarding our discussion, assessment and recommendations, at the time. Dorothy Frederick's recent genetic test results were disclosed to her, as were recommendations warranted by these results. These results and recommendations are discussed in more detail below.  CANCER HISTORY:  Oncology History   No history exists.    FAMILY HISTORY:  We obtained a detailed, 4-generation family history.  Significant diagnoses are listed below: Family History  Problem Relation Age of Onset  . Heart disease Mother   . Hypertension Father   . Cancer Father   . Prostate cancer Father   . Hypertension Sister   . Breast cancer Sister        dx 17s  . Heart disease Sister   . Hypertension Brother   . Hyperlipidemia Maternal Grandmother   . Stroke Maternal Grandmother   . Hypertension Brother   . Breast cancer Maternal Aunt   . Lung cancer Maternal Aunt   . Esophageal cancer Maternal Aunt   . Stomach cancer Paternal Grandmother     Dorothy Frederick has two daughters. She has 2 maternal half sisters and 2 maternal half brothers. One of her half sisters had breast cancer diagnosed in her 27s and is living at 66. Patient says this sister had genetic testing that was negative.  Dorothy Frederick's mother died at 38. The patient had 8 maternal aunts, some of which were half aunts. One of her full aunts had breast cancer in her 10s, and another full aunt had lung cancer. One of her half aunts had esophageal cancer. No known cancers in her maternal cousins. Her maternal grandmother died in her 47s, grandfather died in his 15s.  Dorothy Frederick's father died at 31 and had prostate cancer. She does not know of any paternal aunts/uncles. Her paternal grandmother  died of stomach cancer that was diagnosed at age 49. No cancers for her paternal grandfather.  Dorothy Frederick is aware of previous family history of genetic testing for hereditary cancer risks.  There is no reported Ashkenazi Jewish ancestry. There is no known consanguinity.  GENETIC TEST RESULTS: Genetic testing reported out on 07/29/2018 through the [facility] Common Hereditary cancer panel found no pathogenic mutations.  The Common Hereditary Cancers Panel offered by [facility] includes sequencing and/or deletion duplication testing of the following 47 genes: APC, ATM, AXIN2, BARD1, BMPR1A, BRCA1, BRCA2, BRIP1, CDH1, CDKN2A (p14ARF), CDKN2A (p16INK4a), CKD4, CHEK2, CTNNA1, DICER1, EPCAM (Deletion/duplication testing only), GREM1 (promoter region deletion/duplication testing only), KIT, MEN1, MLH1, MSH2, MSH3, MSH6, MUTYH, NBN, NF1, NHTL1, PALB2, PDGFRA, PMS2, POLD1, POLE, PTEN, RAD50, RAD51C, RAD51D, SDHB, SDHC, SDHD, SMAD4, SMARCA4. STK11, TP53, TSC1, TSC2, and VHL.  The following genes were evaluated for sequence changes only: SDHA and HOXB13 c.251G>A variant only.  The test report has been scanned into EPIC and is located under the Molecular Pathology section of the Results Review tab.  A portion of the result report is included below for reference.     We discussed with Dorothy Frederick that because current genetic testing is not perfect, it is possible there may be a gene mutation in one of these genes that current testing cannot detect, but that chance is small.  We also discussed, that there could be another gene that has not yet been discovered, or that  we have not yet tested, that is responsible for the cancer diagnoses in the family. It is also possible there is a hereditary cause for the cancer in the family that Dorothy Frederick did not inherit and therefore was not identified in her testing.  Therefore, it is important to remain in touch with cancer genetics in the future so that we can continue to offer  Dorothy Frederick the most up to date genetic testing.   ADDITIONAL GENETIC TESTING: We discussed with Dorothy Frederick that her genetic testing was fairly extensive.  If there are genes identified to increase cancer risk that can be analyzed in the future, we would be happy to discuss and coordinate this testing at that time.    CANCER SCREENING RECOMMENDATIONS: Dorothy Frederick's test result is considered negative (normal).  This means that we have not identified a hereditary cause for her  personal and family history of cancer at this time. Most cancers happen by chance and this negative test suggests that her cancer may fall into this category.    While reassuring, this does not definitively rule out a hereditary predisposition to cancer. It is still possible that there could be genetic mutations that are undetectable by current technology. There could be genetic mutations in genes that have not been tested or identified to increase cancer risk.  Therefore, it is recommended she continue to follow the cancer management and screening guidelines provided by her oncology and primary healthcare provider.   An individual's cancer risk and medical management are not determined by genetic test results alone. Overall cancer risk assessment incorporates additional factors, including personal medical history, family history, and any available genetic information that may result in a personalized plan for cancer prevention and surveillance  RECOMMENDATIONS FOR FAMILY MEMBERS:  Relatives in this family might be at some increased risk of developing cancer, over the general population risk, simply due to the family history of cancer.  We recommended female relatives in this family have a yearly mammogram beginning at age 90, or 47 years younger than the earliest onset of cancer, an annual clinical breast exam, and perform monthly breast self-exams. Female relatives in this family should also have a gynecological exam as  recommended by their primary provider. All family members should have a colonoscopy by age 66, or as directed by their physicians.  FOLLOW-UP: Lastly, we discussed with Dorothy Frederick that cancer genetics is a rapidly advancing field and it is possible that new genetic tests will be appropriate for her and/or her family members in the future. We encouraged her to remain in contact with cancer genetics on an annual basis so we can update her personal and family histories and let her know of advances in cancer genetics that may benefit this family.   Our contact number was provided. Dorothy Frederick's questions were answered to her satisfaction, and she knows she is welcome to call us at anytime with additional questions or concerns.   Faith Rogue, MS Genetic Counselor [email or web address] Phone: [phone]

## 2018-07-30 NOTE — Telephone Encounter (Signed)
Revealed negative genetic testing.  This normal result is reassuring and indicates that it is unlikely Dorothy Frederick's cancer is due to a hereditary cause.  It is unlikely that there is an increased risk of another cancer due to a mutation in one of these genes.  However, genetic testing is not perfect, and cannot definitively rule out a hereditary cause.  It will be important for her to keep in contact with genetics to learn if any additional testing may be needed in the future.

## 2018-08-03 ENCOUNTER — Other Ambulatory Visit: Payer: Self-pay | Admitting: Oncology

## 2018-08-03 DIAGNOSIS — C50919 Malignant neoplasm of unspecified site of unspecified female breast: Secondary | ICD-10-CM | POA: Diagnosis not present

## 2018-08-05 ENCOUNTER — Telehealth: Payer: Self-pay | Admitting: Cardiovascular Disease

## 2018-08-05 NOTE — Telephone Encounter (Signed)
Pt is scheduled to see Kerin Ransom PA-C on 08/10/2018

## 2018-08-05 NOTE — Telephone Encounter (Signed)
New Message         [facility] Pre-operative Risk Assessment    Request for surgical clearance:  1. What type of surgery is being performed? PAC, Left  Mastectomy, Left Deep Axillary Lymphectomy   2. When is this surgery scheduled? TBD waiting for clearance   3. What type of clearance is required (medical clearance vs. Pharmacy clearance to hold med vs. Both)? Medical   4. Are there any medications that need to be held prior to surgery and how long? No   5. Practice name and name of physician performing surgery? [facility] Dr Dalbert Batman   6. What is your office phone number [phone]   7.   What is your office fax number [phone]  8.   Anesthesia type (None, local, MAC, general) ? General    Dorothy Frederick 08/05/2018, 9:35 AM  _________________________________________________________________   (provider comments below)

## 2018-08-05 NOTE — Telephone Encounter (Signed)
   Primary [place], MD  Chart reviewed as part of pre-operative protocol coverage. Because of Norman Fountaine's past medical history and time since last visit, he/she will require a follow-up visit in order to better assess preoperative cardiovascular risk.  PLEASE arrange for appt this week or first of next. With any APP.  It has been 10 months since last visit.       Pre-op covering staff: - Please schedule appointment and call patient to inform them. - Please contact requesting surgeon's office via preferred method (i.e, phone, fax) to inform them of need for appointment prior to surgery.  If applicable, this message will also be routed to pharmacy pool and/or primary cardiologist for input on holding anticoagulant/antiplatelet agent as requested below so that this information is available at time of patient's appointment.   Cecilie Kicks, NP  08/05/2018, 9:49 AM

## 2018-08-07 ENCOUNTER — Telehealth: Payer: Self-pay | Admitting: Cardiology

## 2018-08-07 ENCOUNTER — Other Ambulatory Visit: Payer: Self-pay | Admitting: Family Medicine

## 2018-08-07 DIAGNOSIS — N63 Unspecified lump in unspecified breast: Secondary | ICD-10-CM

## 2018-08-07 NOTE — Telephone Encounter (Signed)
I called pt, no answer and no voicemail.

## 2018-08-08 ENCOUNTER — Other Ambulatory Visit: Payer: Self-pay

## 2018-08-08 ENCOUNTER — Ambulatory Visit
Admission: RE | Admit: 2018-08-08 | Discharge: 2018-08-08 | Disposition: A | Discharge status: Home / Self Care | Payer: BC Managed Care – PPO | Source: Ambulatory Visit | Attending: Family Medicine | Admitting: Family Medicine

## 2018-08-08 DIAGNOSIS — N63 Unspecified lump in unspecified breast: Secondary | ICD-10-CM

## 2018-08-08 DIAGNOSIS — C50412 Malignant neoplasm of upper-outer quadrant of left female breast: Secondary | ICD-10-CM | POA: Diagnosis not present

## 2018-08-08 MED ORDER — GADOBUTROL 1 MMOL/ML IV SOLN
8.0000 mL | Freq: Once | INTRAVENOUS | Status: AC | PRN
  Administered 2018-08-08: 8 mL via INTRAVENOUS

## 2018-08-10 ENCOUNTER — Other Ambulatory Visit: Payer: Self-pay

## 2018-08-10 ENCOUNTER — Ambulatory Visit (INDEPENDENT_AMBULATORY_CARE_PROVIDER_SITE_OTHER): Payer: BC Managed Care – PPO | Admitting: Cardiology

## 2018-08-10 ENCOUNTER — Encounter: Payer: Self-pay | Admitting: Cardiology

## 2018-08-10 VITALS — BP 123/67 | HR 94 | Temp 97.9°F | Ht 64.0 in | Wt 171.0 lb

## 2018-08-10 DIAGNOSIS — I2584 Coronary atherosclerosis due to calcified coronary lesion: Secondary | ICD-10-CM

## 2018-08-10 DIAGNOSIS — I2510 Atherosclerotic heart disease of native coronary artery without angina pectoris: Secondary | ICD-10-CM | POA: Diagnosis not present

## 2018-08-10 DIAGNOSIS — C50412 Malignant neoplasm of upper-outer quadrant of left female breast: Secondary | ICD-10-CM | POA: Diagnosis not present

## 2018-08-10 DIAGNOSIS — Z01818 Encounter for other preprocedural examination: Secondary | ICD-10-CM

## 2018-08-10 DIAGNOSIS — I10 Essential (primary) hypertension: Secondary | ICD-10-CM | POA: Diagnosis not present

## 2018-08-10 DIAGNOSIS — Z171 Estrogen receptor negative status [ER-]: Secondary | ICD-10-CM

## 2018-08-10 NOTE — Progress Notes (Signed)
Cardiology Office Note:    Date:  08/10/2018   ID:  Dorothy Frederick, DOB 10-30-1955, MRN [id number]  PCP:  [name], MD  Cardiologist:  Skeet Latch, MD  Electrophysiologist:  None   Referring MD: [name], MD   Chief Complaint  Patient presents with  . other    Surgical Clearance. Medications reviewed verbally with patient.    History of Present Illness:    Dorothy Frederick is a 63 y.o. female with a hx of breast cancer status post partial left mastectomy in 2000.  She is recently had recurrence and is here today for preoperative clearance prior to full left mastectomy.  Patient has a history of nonobstructive coronary disease by catheterization in 2015.  She did have an echo in July 2020 which showed normal LV function.  She has hypertension and mild COPD.  Since we saw the patient last she denies any unusual chest pain or shortness of breath.  Past Medical History:  Diagnosis Date  . Angina at rest [facility])   . Cancer ([place])    breast  . COPD (chronic obstructive pulmonary disease) ([place])   . Family history of breast cancer   . Family history of esophageal cancer   . Family history of lung cancer   . Family history of prostate cancer   . Hyperlipidemia   . Hypertension   . Hypothyroidism     Past Surgical History:  Procedure Laterality Date  . ADENOIDECTOMY    . LEFT HEART CATHETERIZATION WITH CORONARY ANGIOGRAM N/A 10/21/2013   Procedure: LEFT HEART CATHETERIZATION WITH CORONARY ANGIOGRAM;  Surgeon: Burnell Blanks, MD;  Location: [facility] CATH LAB;  Service: Cardiovascular;  Laterality: N/A;  . MASTECTOMY, PARTIAL Left 2000  . TONSILLECTOMY      Current Medications: Current Meds  Medication Sig  . albuterol (PROVENTIL HFA;VENTOLIN HFA) 108 (90 BASE) MCG/ACT inhaler Inhale 2 puffs into the lungs every 6 (six) hours as needed for wheezing or shortness of breath.  Marland Kitchen alendronate (FOSAMAX) 70 MG tablet Take 1 tablet by mouth once a week.  Marland Kitchen  atorvastatin (LIPITOR) 10 MG tablet Take 40 mg by mouth daily.   . Cholecalciferol 1000 UNITS capsule Take 500 Units by mouth daily.   Marland Kitchen escitalopram (LEXAPRO) 10 MG tablet Take 10 mg by mouth daily.  . famotidine (PEPCID) 40 MG tablet Take 40 mg by mouth daily.  . Fluticasone-Umeclidin-Vilant (TRELEGY ELLIPTA) 100-62.5-25 MCG/INH AEPB Inhale 1 puff into the lungs daily as needed.  Marland Kitchen levothyroxine (SYNTHROID, LEVOTHROID) 50 MCG tablet Take 1 tablet by mouth daily.  Marland Kitchen olmesartan (BENICAR) 20 MG tablet Take 20 mg by mouth daily.     Allergies:   Benadryl [diphenhydramine hcl (sleep)] and Sulfa antibiotics   Social History   Socioeconomic History  . Marital status: Divorced    Spouse name: Not on file  . Number of children: Not on file  . Years of education: Not on file  . Highest education level: Not on file  Occupational History  . Not on file  Social Needs  . Financial resource strain: Not on file  . Food insecurity    Worry: Not on file    Inability: Not on file  . Transportation needs    Medical: Not on file    Non-medical: Not on file  Tobacco Use  . Smoking status: Former Smoker    Packs/day: 1.00    Years: 38.00    Pack years: 38.00    Types: Cigarettes  . Smokeless  tobacco: Never Used  Substance and Sexual Activity  . Alcohol use: No  . Drug use: No  . Sexual activity: Not on file  Lifestyle  . Physical activity    Days per week: Not on file    Minutes per session: Not on file  . Stress: Not on file  Relationships  . Social Herbalist on phone: Not on file    Gets together: Not on file    Attends religious service: Not on file    Active member of club or organization: Not on file    Attends meetings of clubs or organizations: Not on file    Relationship status: Not on file  Other Topics Concern  . Not on file  Social History Narrative  . Not on file     Family History: The patient's family history includes Breast cancer in her maternal aunt  and sister; Cancer in her father; Esophageal cancer in her maternal aunt; Heart disease in her mother and sister; Hyperlipidemia in her maternal grandmother; Hypertension in her brother, brother, father, and sister; Lung cancer in her maternal aunt; Prostate cancer in her father; Stomach cancer in her paternal grandmother; Stroke in her maternal grandmother.  ROS:   Please see the history of present illness.     All other systems reviewed and are negative.  EKGs/Labs/Other Studies Reviewed:    The following studies were reviewed today: Echo July 2020  EKG:  EKG is ordered today.  The ekg ordered today demonstrates NSR, NSST changes (TWI V3)  Recent Labs: 07/22/2018: ALT 20; BUN 14; Creatinine 1.11; Hemoglobin 12.3; Platelet Count 266; Potassium 3.9; Sodium 140  Recent Lipid Panel No results found for: CHOL, TRIG, HDL, CHOLHDL, VLDL, LDLCALC, LDLDIRECT  Physical Exam:    VS:  BP 123/67 (BP Location: Right Arm, Patient Position: Sitting, Cuff Size: Normal)   Pulse 94   Temp 97.9 F (36.6 C) (Temporal)   Ht 5\' 4"  (1.626 m)   Wt 171 lb (77.6 kg)   SpO2 97%   BMI 29.35 kg/m     Wt Readings from Last 3 Encounters:  08/10/18 171 lb (77.6 kg)  07/22/18 173 lb (78.5 kg)  10/09/17 172 lb 9.6 oz (78.3 kg)     GEN:  Well nourished, well developed in no acute distress HEENT: Normal NECK: No JVD; No carotid bruits LYMPHATICS: No lymphadenopathy CARDIAC: RRR, no murmurs, rubs, gallops RESPIRATORY:  Decreased breath sounds Lt base ABDOMEN: Soft, non-tender, non-distended MUSCULOSKELETAL:  No edema; No deformity  SKIN: Warm and dry NEUROLOGIC:  Alert and oriented x 3 PSYCHIATRIC:  Normal affect   ASSESSMENT:    Pre-operative clearance Pre op clearance for mastectomy  Malignant neoplasm of upper-outer quadrant of left breast in female, estrogen receptor negative ([place]) H/O breast cancer -s/p Lt mastectomy in 2000, now with recurrence requiring full mastectomy.  CAD due to calcified  coronary lesion Non obstructive CAD by cath 2015, normal LVF by echo July 2020  HTN (hypertension) Controlled  PLAN:    Pt is an acceptable risk for proposed surgery without further cardiac work up. I will forward official clearance to the surgeon via [place].   Medication Adjustments/Labs and Tests Ordered: Current medicines are reviewed at length with the patient today.  Concerns regarding medicines are outlined above.  Orders Placed This Encounter  Procedures  . EKG 12-Lead   No orders of the defined types were placed in this encounter.   Patient Instructions  Medication Instructions:  Your physician  recommends that you continue on your current medications as directed. Please refer to the Current Medication list given to you today. If you need a refill on your cardiac medications before your next appointment, please call your pharmacy.   Lab work: None  If you have labs (blood work) drawn today and your tests are completely normal, you will receive your results only by: Marland Kitchen MyChart Message (if you have MyChart) OR . A paper copy in the mail If you have any lab test that is abnormal or we need to change your treatment, we will call you to review the results.  Testing/Procedures: None   Follow-Up: At [facility], you and your health needs are our priority.  As part of our continuing mission to provide you with exceptional heart care, we have created designated Provider Care Teams.  These Care Teams include your primary Cardiologist (physician) and Advanced Practice Providers (APPs -  Physician Assistants and Nurse Practitioners) who all work together to provide you with the care you need, when you need it. You will need a follow up appointment in 12 months.  Please call our office 2 months in advance to schedule this appointment.  You may see Skeet Latch, MD or one of the following Advanced Practice Providers on your designated Care Team:   Kerin Ransom, PA-C Roby Lofts,  [place] . Sande Rives, PA-C  Any Other Special Instructions Will Be Listed Below (If Applicable). YOU ARE CLEARED FOR YOUR UPCOMING PROCEDURE.     Signed, Kerin Ransom, PA-C  08/10/2018 9:28 AM    [facility]

## 2018-08-10 NOTE — Patient Instructions (Signed)
Medication Instructions:  Your physician recommends that you continue on your current medications as directed. Please refer to the Current Medication list given to you today. If you need a refill on your cardiac medications before your next appointment, please call your pharmacy.   Lab work: None  If you have labs (blood work) drawn today and your tests are completely normal, you will receive your results only by: Marland Kitchen MyChart Message (if you have MyChart) OR . A paper copy in the mail If you have any lab test that is abnormal or we need to change your treatment, we will call you to review the results.  Testing/Procedures: None   Follow-Up: At [facility], you and your health needs are our priority.  As part of our continuing mission to provide you with exceptional heart care, we have created designated Provider Care Teams.  These Care Teams include your primary Cardiologist (physician) and Advanced Practice Providers (APPs -  Physician Assistants and Nurse Practitioners) who all work together to provide you with the care you need, when you need it. You will need a follow up appointment in 12 months.  Please call our office 2 months in advance to schedule this appointment.  You may see Skeet Latch, MD or one of the following Advanced Practice Providers on your designated Care Team:   Kerin Ransom, PA-C Roby Lofts, [place] . Sande Rives, PA-C  Any Other Special Instructions Will Be Listed Below (If Applicable). YOU ARE CLEARED FOR YOUR UPCOMING PROCEDURE.

## 2018-08-10 NOTE — Assessment & Plan Note (Signed)
Pre op clearance for mastectomy

## 2018-08-10 NOTE — Assessment & Plan Note (Signed)
H/O breast cancer -s/p Lt mastectomy in 2000, now with recurrence requiring full mastectomy.

## 2018-08-10 NOTE — Telephone Encounter (Signed)
   Primary Cardiologist: Skeet Latch, MD  Chart reviewed and patient seen in the office today as part of pre-operative protocol coverage. Given past medical history and time since last visit, based on ACC/AHA guidelines, Lavere Shinsky would be at acceptable risk for the planned procedure without further cardiovascular testing.   I will route this recommendation to the requesting party via Epic fax function and remove from pre-op pool.  Please call with questions.  Kerin Ransom, PA-C 08/10/2018, 9:32 AM

## 2018-08-10 NOTE — Assessment & Plan Note (Signed)
Controlled.  

## 2018-08-10 NOTE — Assessment & Plan Note (Signed)
Non obstructive CAD by cath 2015, normal LVF by echo July 2020

## 2018-08-13 ENCOUNTER — Other Ambulatory Visit: Payer: Self-pay | Admitting: General Surgery

## 2018-08-13 DIAGNOSIS — C50412 Malignant neoplasm of upper-outer quadrant of left female breast: Secondary | ICD-10-CM | POA: Diagnosis not present

## 2018-08-13 DIAGNOSIS — Z803 Family history of malignant neoplasm of breast: Secondary | ICD-10-CM | POA: Diagnosis not present

## 2018-08-13 DIAGNOSIS — Z853 Personal history of malignant neoplasm of breast: Secondary | ICD-10-CM | POA: Diagnosis not present

## 2018-08-13 DIAGNOSIS — E039 Hypothyroidism, unspecified: Secondary | ICD-10-CM | POA: Diagnosis not present

## 2018-08-18 ENCOUNTER — Encounter: Payer: Self-pay | Admitting: *Deleted

## 2018-08-18 DIAGNOSIS — Z853 Personal history of malignant neoplasm of breast: Secondary | ICD-10-CM | POA: Diagnosis not present

## 2018-08-18 DIAGNOSIS — N6312 Unspecified lump in the right breast, upper inner quadrant: Secondary | ICD-10-CM | POA: Diagnosis not present

## 2018-08-18 DIAGNOSIS — N6314 Unspecified lump in the right breast, lower inner quadrant: Secondary | ICD-10-CM | POA: Diagnosis not present

## 2018-08-19 ENCOUNTER — Telehealth: Payer: Self-pay | Admitting: Oncology

## 2018-08-19 NOTE — Telephone Encounter (Signed)
Scheduled appt per 7/28 sch message - pt aware of apts cancelled and added.

## 2018-08-20 ENCOUNTER — Ambulatory Visit: Payer: BC Managed Care – PPO | Admitting: Oncology

## 2018-08-24 ENCOUNTER — Other Ambulatory Visit: Payer: Self-pay

## 2018-08-24 DIAGNOSIS — N6313 Unspecified lump in the right breast, lower outer quadrant: Secondary | ICD-10-CM | POA: Diagnosis not present

## 2018-08-24 DIAGNOSIS — D241 Benign neoplasm of right breast: Secondary | ICD-10-CM | POA: Diagnosis not present

## 2018-08-24 DIAGNOSIS — N6312 Unspecified lump in the right breast, upper inner quadrant: Secondary | ICD-10-CM | POA: Diagnosis not present

## 2018-08-25 ENCOUNTER — Ambulatory Visit: Payer: BC Managed Care – PPO

## 2018-08-25 ENCOUNTER — Other Ambulatory Visit: Payer: BC Managed Care – PPO

## 2018-08-25 ENCOUNTER — Encounter: Payer: Self-pay | Admitting: Oncology

## 2018-09-01 ENCOUNTER — Other Ambulatory Visit: Payer: BC Managed Care – PPO

## 2018-09-01 ENCOUNTER — Ambulatory Visit: Payer: BC Managed Care – PPO

## 2018-09-01 ENCOUNTER — Encounter: Payer: Self-pay | Admitting: Oncology

## 2018-09-08 ENCOUNTER — Ambulatory Visit: Payer: BC Managed Care – PPO

## 2018-09-08 ENCOUNTER — Other Ambulatory Visit: Payer: BC Managed Care – PPO

## 2018-09-09 ENCOUNTER — Other Ambulatory Visit: Payer: Self-pay

## 2018-09-09 ENCOUNTER — Ambulatory Visit (HOSPITAL_COMMUNITY): Admission: RE | Admit: 2018-09-09 | Payer: BC Managed Care – PPO | Source: Ambulatory Visit

## 2018-09-09 ENCOUNTER — Other Ambulatory Visit: Payer: Self-pay | Admitting: General Surgery

## 2018-09-09 ENCOUNTER — Encounter (HOSPITAL_BASED_OUTPATIENT_CLINIC_OR_DEPARTMENT_OTHER): Payer: Self-pay | Admitting: *Deleted

## 2018-09-09 ENCOUNTER — Encounter (HOSPITAL_BASED_OUTPATIENT_CLINIC_OR_DEPARTMENT_OTHER)
Admission: RE | Admit: 2018-09-09 | Discharge: 2018-09-09 | Disposition: A | Discharge status: Home / Self Care | Payer: BC Managed Care – PPO | Source: Ambulatory Visit | Attending: General Surgery | Admitting: General Surgery

## 2018-09-09 ENCOUNTER — Ambulatory Visit
Admission: RE | Admit: 2018-09-09 | Discharge: 2018-09-09 | Disposition: A | Discharge status: Home / Self Care | Payer: BC Managed Care – PPO | Source: Ambulatory Visit | Attending: General Surgery | Admitting: General Surgery

## 2018-09-09 DIAGNOSIS — Z01818 Encounter for other preprocedural examination: Secondary | ICD-10-CM

## 2018-09-09 DIAGNOSIS — J449 Chronic obstructive pulmonary disease, unspecified: Secondary | ICD-10-CM | POA: Diagnosis not present

## 2018-09-09 DIAGNOSIS — C50919 Malignant neoplasm of unspecified site of unspecified female breast: Secondary | ICD-10-CM | POA: Diagnosis not present

## 2018-09-09 DIAGNOSIS — Z01812 Encounter for preprocedural laboratory examination: Secondary | ICD-10-CM | POA: Insufficient documentation

## 2018-09-09 LAB — CBC WITH DIFFERENTIAL/PLATELET
Abs Immature Granulocytes: 0.01 10*3/uL (ref 0.00–0.07)
Basophils Absolute: 0 10*3/uL (ref 0.0–0.1)
Basophils Relative: 0 %
Eosinophils Absolute: 0.1 10*3/uL (ref 0.0–0.5)
Eosinophils Relative: 1 %
HCT: 38 % (ref 36.0–46.0)
Hemoglobin: 12.5 g/dL (ref 12.0–15.0)
Immature Granulocytes: 0 %
Lymphocytes Relative: 14 %
Lymphs Abs: 0.9 10*3/uL (ref 0.7–4.0)
MCH: 29.5 pg (ref 26.0–34.0)
MCHC: 32.9 g/dL (ref 30.0–36.0)
MCV: 89.6 fL (ref 80.0–100.0)
Monocytes Absolute: 0.5 10*3/uL (ref 0.1–1.0)
Monocytes Relative: 8 %
Neutro Abs: 4.8 10*3/uL (ref 1.7–7.7)
Neutrophils Relative %: 77 %
Platelets: 287 10*3/uL (ref 150–400)
RBC: 4.24 MIL/uL (ref 3.87–5.11)
RDW: 13.3 % (ref 11.5–15.5)
WBC: 6.3 10*3/uL (ref 4.0–10.5)
nRBC: 0 % (ref 0.0–0.2)

## 2018-09-09 LAB — COMPREHENSIVE METABOLIC PANEL
ALT: 22 U/L (ref 0–44)
AST: 19 U/L (ref 15–41)
Albumin: 3.9 g/dL (ref 3.5–5.0)
Alkaline Phosphatase: 88 U/L (ref 38–126)
Anion gap: 10 (ref 5–15)
BUN: 12 mg/dL (ref 8–23)
CO2: 22 mmol/L (ref 22–32)
Calcium: 9 mg/dL (ref 8.9–10.3)
Chloride: 106 mmol/L (ref 98–111)
Creatinine, Ser: 1.1 mg/dL — ABNORMAL HIGH (ref 0.44–1.00)
GFR calc Af Amer: >60 mL/min (ref >60)
GFR calc non Af Amer: 53 mL/min — ABNORMAL LOW (ref >60)
Glucose, Bld: 91 mg/dL (ref 70–99)
Potassium: 4.1 mmol/L (ref 3.5–5.1)
Sodium: 138 mmol/L (ref 135–145)
Total Bilirubin: 1 mg/dL (ref 0.3–1.2)
Total Protein: 7.3 g/dL (ref 6.5–8.1)

## 2018-09-09 NOTE — Progress Notes (Signed)
      Enhanced Recovery after Surgery for Orthopedics Enhanced Recovery after Surgery is a protocol used to improve the stress on your body and your recovery after surgery.  Patient Instructions  . The night before surgery:  o No food after midnight. ONLY clear liquids after midnight  . The day of surgery (if you do NOT have diabetes):  o Drink ONE (1) Pre-Surgery Clear Ensure as directed.   o This drink was given to you during your hospital  pre-op appointment visit. o The pre-op nurse will instruct you on the time to drink the  Pre-Surgery Ensure depending on your surgery time. o Finish the drink at the designated time by the pre-op nurse.  o Nothing else to drink after completing the  Pre-Surgery Clear Ensure.  . The day of surgery (if you have diabetes): o Drink ONE (1) Gatorade 2 (G2) as directed. o This drink was given to you during your hospital  pre-op appointment visit.  o The pre-op nurse will instruct you on the time to drink the   Gatorade 2 (G2) depending on your surgery time. o Color of the Gatorade may vary. Red is not allowed. o Nothing else to drink after completing the  Gatorade 2 (G2).         If you have questions, please contact your surgeon's office.   Surgical soap also given to patient with instructions for use.  Patient verbalized understanding.

## 2018-09-12 ENCOUNTER — Other Ambulatory Visit (HOSPITAL_COMMUNITY)
Admission: RE | Admit: 2018-09-12 | Discharge: 2018-09-12 | Disposition: A | Discharge status: Home / Self Care | Payer: BC Managed Care – PPO | Source: Ambulatory Visit | Attending: General Surgery | Admitting: General Surgery

## 2018-09-12 DIAGNOSIS — Z20828 Contact with and (suspected) exposure to other viral communicable diseases: Secondary | ICD-10-CM | POA: Insufficient documentation

## 2018-09-12 DIAGNOSIS — Z01812 Encounter for preprocedural laboratory examination: Secondary | ICD-10-CM | POA: Diagnosis not present

## 2018-09-12 LAB — SARS CORONAVIRUS 2 (TAT 6-24 HRS): SARS Coronavirus 2: NEGATIVE

## 2018-09-13 NOTE — H&P (Signed)
Dorothy Frederick Location: [facility] Patient #: [id number] DOB: October 18, 1955 Divorced / Language: English / Race: White Female      History of Present Illness        This is a 63 year old female with recurrent cancer of the left breast. Her daughter is with her throughout the encounter. She is here for discussion and  scheduling of definitive surgery Dr. Jana Hakim and Dr. Sondra Come are involved in her care. Rachell Cipro is her PCP. Cardiologist is Dr. Sallyanne Kuster. She has seen Dr. Iran Planas.      In the year 2000 she underwent left breast lumpectomy and sentinel node biopsy in [place]. She said one node was removed. She was 63 years old. She received adjuvant radiation therapy but not any chemotherapy. She recently presented with a palpable 1.3 cm mass in the left breast upper outer quadrant. 2 o'clock position. Middle depth. 5 cm from the nipple. This was 3 cm anterior to the old lumpectomy site. Image guided biopsy shows invasive ductal carcinoma, grade 3, receptor negative, HER-2 positive. Genetic testing is negative Ultrasound of the axilla is negative She will need adjuvant Herceptin-based chemotherapy for 1 year and she accepts that.      She has seen plastic surgery and discussed all of her options. After considering this for the last 2 or 3 weeks she has decided against any immediate reconstruction. She states that it is too involved at this time. She knows she can have delayed reconstruction later if she wishes     Past history significant for left heart catheter for noncritical stenosis. She had an abnormal stress test which led to this. She is on statins. Stable without chest pain or shortness of breath. Baseline COPD. She has inverted T waves in her EKG she has COPD but doesn't use oxygen. Does use inhalers. Hypothyroidism Family history reveals sister diagnosed with breast cancer age 30 negative genetics. Maternal aunt had breast cancer age 27.  Mother diagnosed with leukemia and heart disease. Father deceased of prostate cancer. Maternal grandmother died of gastric cancer Social history reveals she is divorced. 2 children ...one lives in [place]   with her in the other lives in [place]. Denies alcohol. Former smoker.  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     We reviewed her imaging studies, diagnosis, pathology and histology, overall treatment plan as coordinated. She'll be scheduled for insertion of Port-A-Cath, left total mastectomy, left axillary sentinel node biopsy without reconstruction I discussed the indications, details, techniques, and numerous risk of the surgery with her and her daughter. She is aware of the risk of bleeding, infection, arm swelling, arm numbness, somewhat increased risk because of second axillary dissection, shoulder disability, cardiac pulmonary and thromboembolic problems. She understands all these issues well. All of her questions are answered. She agrees with this plan.  Addendum Note MRI showed enhancement in right breast medially. Second look ultrasound performed at [facility]. Biopsies X 2 performed at [facility] 08/24/2018. Right breast needle core biopsy at 2:30 shows fibroadenoma Right breast needle core biopsy at 5:00 shows fibroadenoma Both stated to be concordant. No further evaluation or treatment of right breast indicated Plan is to proceed with stated surgical treatment plan for left breast cancer.     Allergies  Benadryl *ANTIHISTAMINES*  Increased heart rate Sulfa Antibiotics  Rash. Allergies Reconciled   Medication History  Albuterol (90MCG/ACT Aerosol Soln, Inhalation) Active. Lipitor (10MG Tablet, Oral) Active. Lexapro (10MG Tablet, Oral) Active. Cholecalciferol (25 MCG(1000 UT)  Capsule, Oral) Active. Synthroid (50MCG Tablet, Oral) Active. Benicar (20MG Tablet, Oral) Active. PriLOSEC (20MG Capsule DR, Oral)  Active. Zantac (300MG Tablet, Oral) Active. Symbicort (160-4.5MCG/ACT Aerosol, Inhalation) Active. Alendronate Sodium (70MG Tablet, Oral) Active. Fosamax (70MG Tablet, Oral) Active. Medications Reconciled  Vitals  Weight: 174 lb Height: 64in Body Surface Area: 1.84 m Body Mass Index: 29.87 kg/m  Temp.: 98.64F  Pulse: 81 (Regular)  BP: 122/76(Sitting, Left Arm, Standard)     Physical Exam  General Mental Status-Alert. General Appearance-Not in acute distress. Build & Nutrition-Well nourished. Posture-Normal posture. Gait-Normal.  Head and Neck Head-normocephalic, atraumatic with no lesions or palpable masses. Trachea-midline. Thyroid Gland Characteristics - normal size and consistency and no palpable nodules.  Chest and Lung Exam Chest and lung exam reveals -on auscultation, normal breath sounds, no adventitious sounds and normal vocal resonance.  Breast Note: Breast are medium-sized. Faint scar upper outer quadrant left breast. Faint scar left axilla. Small palpable mass upper outer quadrant left breast. No other masses or skin changes on either side. No palpable adenopathy.   Cardiovascular Cardiovascular examination reveals -normal heart sounds, regular rate and rhythm with no murmurs and femoral artery auscultation bilaterally reveals normal pulses, no bruits, no thrills.  Abdomen Inspection Inspection of the abdomen reveals - No Hernias. Palpation/Percussion Palpation and Percussion of the abdomen reveal - Soft, Non Tender, No Rigidity (guarding), No hepatosplenomegaly and No Palpable abdominal masses.  Neurologic Neurologic evaluation reveals -alert and oriented x 3 with no impairment of recent or remote memory, normal attention span and ability to concentrate, normal sensation and normal coordination.  Musculoskeletal Normal Exam - Bilateral-Upper Extremity Strength Normal and Lower Extremity Strength  Normal.    Assessment & Plan  PRIMARY CANCER OF UPPER OUTER QUADRANT OF LEFT FEMALE BREAST (C50.412)   You have seen your cardiologist and they feel that you are a good candidate for general anesthesia with acceptable cardiac risk.  You have seen plastic surgery and they have advised you of your reconstructive options and the risks and what is involved You have decided against immediate reconstruction.  Your genetic testing is negative, so your risk of another cancer later in life is average but probably approaches 20%.  you will be scheduled for insertion of Port-A-Cath, left total mastectomy, left axillary sentinel node biopsy and injection of blue dye you will need to stay in the hospital one night I have discussed the indications, details, techniques, and numerous risk of the surgery with you and your daughter.  You have requested that the surgery be scheduled as soon as possible after August 16 so that you can go on a beach vacation with your family. That is reasonable.  Tell your employer that you will likely return to work 6 weeks postop   HISTORY OF LEFT BREAST CANCER (Z85.3)  FAMILY HISTORY OF BREAST CANCER (Z80.3)  HYPOTHYROIDISM, ADULT (E03.9)  FORMER SMOKER (Z87.891)  COPD, MODERATE (J44.9)  HYPERTENSION, ESSENTIAL (I10)  CORONARY ARTERY DISEASE, OCCLUSIVE (I25.10)    Haywood M. Dalbert Batman, M.D., [facility] General and Minimally invasive Surgery Breast and Colorectal Surgery Office:   [phone] Pager:   [phone]

## 2018-09-15 ENCOUNTER — Other Ambulatory Visit: Payer: BC Managed Care – PPO

## 2018-09-15 ENCOUNTER — Ambulatory Visit: Payer: BC Managed Care – PPO

## 2018-09-16 ENCOUNTER — Ambulatory Visit (HOSPITAL_BASED_OUTPATIENT_CLINIC_OR_DEPARTMENT_OTHER): Payer: BC Managed Care – PPO | Admitting: Certified Registered"

## 2018-09-16 ENCOUNTER — Ambulatory Visit (HOSPITAL_COMMUNITY): Payer: BC Managed Care – PPO

## 2018-09-16 ENCOUNTER — Encounter (HOSPITAL_BASED_OUTPATIENT_CLINIC_OR_DEPARTMENT_OTHER)
Admission: RE | Disposition: A | Discharge status: Home / Self Care | Payer: Self-pay | Source: Home / Self Care | Attending: General Surgery

## 2018-09-16 ENCOUNTER — Ambulatory Visit (HOSPITAL_BASED_OUTPATIENT_CLINIC_OR_DEPARTMENT_OTHER)
Admission: RE | Admit: 2018-09-16 | Discharge: 2018-09-17 | Disposition: A | Discharge status: Home / Self Care | Payer: BC Managed Care – PPO | Attending: General Surgery | Admitting: General Surgery

## 2018-09-16 ENCOUNTER — Ambulatory Visit (HOSPITAL_COMMUNITY)
Admission: RE | Admit: 2018-09-16 | Discharge: 2018-09-16 | Disposition: A | Discharge status: Home / Self Care | Payer: BC Managed Care – PPO | Source: Ambulatory Visit | Attending: General Surgery | Admitting: General Surgery

## 2018-09-16 ENCOUNTER — Other Ambulatory Visit: Payer: Self-pay

## 2018-09-16 ENCOUNTER — Encounter (HOSPITAL_BASED_OUTPATIENT_CLINIC_OR_DEPARTMENT_OTHER): Payer: Self-pay

## 2018-09-16 DIAGNOSIS — Z853 Personal history of malignant neoplasm of breast: Secondary | ICD-10-CM

## 2018-09-16 DIAGNOSIS — K219 Gastro-esophageal reflux disease without esophagitis: Secondary | ICD-10-CM | POA: Insufficient documentation

## 2018-09-16 DIAGNOSIS — Z419 Encounter for procedure for purposes other than remedying health state, unspecified: Secondary | ICD-10-CM

## 2018-09-16 DIAGNOSIS — C50412 Malignant neoplasm of upper-outer quadrant of left female breast: Secondary | ICD-10-CM

## 2018-09-16 DIAGNOSIS — Z95828 Presence of other vascular implants and grafts: Secondary | ICD-10-CM

## 2018-09-16 DIAGNOSIS — Z79890 Hormone replacement therapy (postmenopausal): Secondary | ICD-10-CM | POA: Insufficient documentation

## 2018-09-16 DIAGNOSIS — Z8042 Family history of malignant neoplasm of prostate: Secondary | ICD-10-CM | POA: Insufficient documentation

## 2018-09-16 DIAGNOSIS — Z87891 Personal history of nicotine dependence: Secondary | ICD-10-CM

## 2018-09-16 DIAGNOSIS — J9811 Atelectasis: Secondary | ICD-10-CM | POA: Diagnosis not present

## 2018-09-16 DIAGNOSIS — Z79899 Other long term (current) drug therapy: Secondary | ICD-10-CM | POA: Diagnosis not present

## 2018-09-16 DIAGNOSIS — I10 Essential (primary) hypertension: Secondary | ICD-10-CM | POA: Diagnosis not present

## 2018-09-16 DIAGNOSIS — Z803 Family history of malignant neoplasm of breast: Secondary | ICD-10-CM | POA: Diagnosis not present

## 2018-09-16 DIAGNOSIS — C50912 Malignant neoplasm of unspecified site of left female breast: Secondary | ICD-10-CM | POA: Diagnosis not present

## 2018-09-16 DIAGNOSIS — Z923 Personal history of irradiation: Secondary | ICD-10-CM | POA: Insufficient documentation

## 2018-09-16 DIAGNOSIS — Z452 Encounter for adjustment and management of vascular access device: Secondary | ICD-10-CM | POA: Diagnosis not present

## 2018-09-16 DIAGNOSIS — Z171 Estrogen receptor negative status [ER-]: Secondary | ICD-10-CM | POA: Diagnosis not present

## 2018-09-16 DIAGNOSIS — Z7983 Long term (current) use of bisphosphonates: Secondary | ICD-10-CM | POA: Insufficient documentation

## 2018-09-16 DIAGNOSIS — J449 Chronic obstructive pulmonary disease, unspecified: Secondary | ICD-10-CM | POA: Insufficient documentation

## 2018-09-16 DIAGNOSIS — I2510 Atherosclerotic heart disease of native coronary artery without angina pectoris: Secondary | ICD-10-CM | POA: Diagnosis not present

## 2018-09-16 DIAGNOSIS — G8918 Other acute postprocedural pain: Secondary | ICD-10-CM | POA: Diagnosis not present

## 2018-09-16 DIAGNOSIS — E039 Hypothyroidism, unspecified: Secondary | ICD-10-CM | POA: Insufficient documentation

## 2018-09-16 DIAGNOSIS — Z01811 Encounter for preprocedural respiratory examination: Secondary | ICD-10-CM

## 2018-09-16 HISTORY — DX: Depression, unspecified: F32.A

## 2018-09-16 HISTORY — DX: Gastro-esophageal reflux disease without esophagitis: K21.9

## 2018-09-16 HISTORY — PX: MASTECTOMY W/ SENTINEL NODE BIOPSY: SHX2001

## 2018-09-16 HISTORY — DX: Anxiety disorder, unspecified: F41.9

## 2018-09-16 HISTORY — DX: Malignant neoplasm of unspecified site of left female breast: C50.912

## 2018-09-16 HISTORY — PX: PORTACATH PLACEMENT: SHX2246

## 2018-09-16 SURGERY — MASTECTOMY WITH SENTINEL LYMPH NODE BIOPSY
Anesthesia: General | Site: Chest | Laterality: Right

## 2018-09-16 MED ORDER — FLUTICASONE-UMECLIDIN-VILANT 100-62.5-25 MCG/INH IN AEPB: 1.0000 | INHALATION_SPRAY | Freq: Every day | RESPIRATORY_TRACT | Status: DC | PRN

## 2018-09-16 MED ORDER — TRAMADOL HCL 50 MG PO TABS
50.0000 mg | ORAL_TABLET | Freq: Four times a day (QID) | ORAL | Status: DC | PRN
  Administered 2018-09-16 – 2018-09-17 (×2): 50 mg via ORAL
  Filled 2018-09-16: qty 1

## 2018-09-16 MED ORDER — MIDAZOLAM HCL 2 MG/2ML IJ SOLN
1.0000 mg | INTRAMUSCULAR | Status: DC | PRN
  Administered 2018-09-16: 2 mg via INTRAVENOUS

## 2018-09-16 MED ORDER — DEXAMETHASONE SODIUM PHOSPHATE 10 MG/ML IJ SOLN
INTRAMUSCULAR | Status: AC
  Filled 2018-09-16: qty 1

## 2018-09-16 MED ORDER — CEFAZOLIN SODIUM-DEXTROSE 2-4 GM/100ML-% IV SOLN
INTRAVENOUS | Status: AC
  Filled 2018-09-16: qty 100

## 2018-09-16 MED ORDER — HEPARIN SOD (PORK) LOCK FLUSH 100 UNIT/ML IV SOLN
INTRAVENOUS | Status: DC | PRN
  Administered 2018-09-16: 500 [IU] via INTRAVENOUS

## 2018-09-16 MED ORDER — FENTANYL CITRATE (PF) 100 MCG/2ML IJ SOLN
INTRAMUSCULAR | Status: AC
  Filled 2018-09-16: qty 2

## 2018-09-16 MED ORDER — HEPARIN (PORCINE) IN NACL 1000-0.9 UT/500ML-% IV SOLN
INTRAVENOUS | Status: AC
  Filled 2018-09-16: qty 500

## 2018-09-16 MED ORDER — FAMOTIDINE 40 MG PO TABS: 40.0000 mg | ORAL_TABLET | Freq: Every day | ORAL | Status: DC

## 2018-09-16 MED ORDER — PROPOFOL 10 MG/ML IV BOLUS
INTRAVENOUS | Status: DC | PRN
  Administered 2018-09-16: 150 mg via INTRAVENOUS

## 2018-09-16 MED ORDER — ENOXAPARIN SODIUM 40 MG/0.4ML ~~LOC~~ SOLN: 40.0000 mg | SUBCUTANEOUS | Status: DC

## 2018-09-16 MED ORDER — IRBESARTAN 75 MG PO TABS: 37.5000 mg | ORAL_TABLET | Freq: Every day | ORAL | Status: DC

## 2018-09-16 MED ORDER — METHYLENE BLUE 0.5 % INJ SOLN
INTRAVENOUS | Status: AC
  Filled 2018-09-16: qty 10

## 2018-09-16 MED ORDER — METHOCARBAMOL 500 MG PO TABS: 500.0000 mg | ORAL_TABLET | Freq: Four times a day (QID) | ORAL | Status: DC | PRN

## 2018-09-16 MED ORDER — GABAPENTIN 300 MG PO CAPS
300.0000 mg | ORAL_CAPSULE | ORAL | Status: AC
  Administered 2018-09-16: 300 mg via ORAL

## 2018-09-16 MED ORDER — HEPARIN SOD (PORK) LOCK FLUSH 100 UNIT/ML IV SOLN
INTRAVENOUS | Status: AC
  Filled 2018-09-16: qty 5

## 2018-09-16 MED ORDER — PROMETHAZINE HCL 25 MG/ML IJ SOLN: 6.2500 mg | INTRAMUSCULAR | Status: DC | PRN

## 2018-09-16 MED ORDER — EPHEDRINE 5 MG/ML INJ
INTRAVENOUS | Status: AC
  Filled 2018-09-16: qty 10

## 2018-09-16 MED ORDER — HYDROMORPHONE HCL 1 MG/ML IJ SOLN: 1.0000 mg | INTRAMUSCULAR | Status: DC | PRN

## 2018-09-16 MED ORDER — DEXAMETHASONE SODIUM PHOSPHATE 10 MG/ML IJ SOLN
INTRAMUSCULAR | Status: DC | PRN
  Administered 2018-09-16: 4 mg via INTRAVENOUS

## 2018-09-16 MED ORDER — ALENDRONATE SODIUM 70 MG PO TABS: 70.0000 mg | ORAL_TABLET | ORAL | Status: DC

## 2018-09-16 MED ORDER — ATORVASTATIN CALCIUM 40 MG PO TABS: 40.0000 mg | ORAL_TABLET | Freq: Every day | ORAL | Status: DC

## 2018-09-16 MED ORDER — ACETAMINOPHEN 325 MG PO TABS: 325.0000 mg | ORAL_TABLET | ORAL | Status: DC | PRN

## 2018-09-16 MED ORDER — LACTATED RINGERS IV SOLN
INTRAVENOUS | Status: DC
  Administered 2018-09-16 – 2018-09-17 (×2): via INTRAVENOUS

## 2018-09-16 MED ORDER — ACETAMINOPHEN 500 MG PO TABS
ORAL_TABLET | ORAL | Status: AC
  Filled 2018-09-16: qty 2

## 2018-09-16 MED ORDER — CHLORHEXIDINE GLUCONATE CLOTH 2 % EX PADS: 6.0000 | MEDICATED_PAD | Freq: Once | CUTANEOUS | Status: DC

## 2018-09-16 MED ORDER — MIDAZOLAM HCL 2 MG/2ML IJ SOLN
INTRAMUSCULAR | Status: AC
  Filled 2018-09-16: qty 2

## 2018-09-16 MED ORDER — TRAMADOL HCL 50 MG PO TABS
ORAL_TABLET | ORAL | Status: AC
  Filled 2018-09-16: qty 1

## 2018-09-16 MED ORDER — GABAPENTIN 300 MG PO CAPS
ORAL_CAPSULE | ORAL | Status: AC
  Filled 2018-09-16: qty 1

## 2018-09-16 MED ORDER — LEVOTHYROXINE SODIUM 50 MCG PO TABS: 50.0000 ug | ORAL_TABLET | Freq: Every day | ORAL | Status: DC

## 2018-09-16 MED ORDER — LACTATED RINGERS IV SOLN
INTRAVENOUS | Status: DC
  Administered 2018-09-16: 07:00:00 via INTRAVENOUS

## 2018-09-16 MED ORDER — HEPARIN (PORCINE) IN NACL 2-0.9 UNITS/ML
INTRAMUSCULAR | Status: AC | PRN
  Administered 2018-09-16: 1 via INTRAVENOUS

## 2018-09-16 MED ORDER — SCOPOLAMINE 1 MG/3DAYS TD PT72: 1.0000 | MEDICATED_PATCH | Freq: Once | TRANSDERMAL | Status: DC

## 2018-09-16 MED ORDER — ONDANSETRON 4 MG PO TBDP: 4.0000 mg | ORAL_TABLET | Freq: Four times a day (QID) | ORAL | Status: DC | PRN

## 2018-09-16 MED ORDER — FENTANYL CITRATE (PF) 100 MCG/2ML IJ SOLN
50.0000 ug | INTRAMUSCULAR | Status: AC | PRN
  Administered 2018-09-16 (×2): 100 ug via INTRAVENOUS
  Administered 2018-09-16: 25 ug via INTRAVENOUS

## 2018-09-16 MED ORDER — ACETAMINOPHEN 500 MG PO TABS
1000.0000 mg | ORAL_TABLET | ORAL | Status: AC
  Administered 2018-09-16: 1000 mg via ORAL

## 2018-09-16 MED ORDER — KETOROLAC TROMETHAMINE 15 MG/ML IJ SOLN: 15.0000 mg | Freq: Once | INTRAMUSCULAR | Status: DC

## 2018-09-16 MED ORDER — CEFAZOLIN SODIUM-DEXTROSE 2-4 GM/100ML-% IV SOLN
2.0000 g | INTRAVENOUS | Status: AC
  Administered 2018-09-16: 08:00:00 2 g via INTRAVENOUS

## 2018-09-16 MED ORDER — CHOLECALCIFEROL 25 MCG (1000 UT) PO CAPS: 500.0000 [IU] | ORAL_CAPSULE | Freq: Every day | ORAL | Status: DC

## 2018-09-16 MED ORDER — ROPIVACAINE HCL 5 MG/ML IJ SOLN
INTRAMUSCULAR | Status: DC | PRN
  Administered 2018-09-16 (×6): 5 mL via PERINEURAL

## 2018-09-16 MED ORDER — EPHEDRINE SULFATE 50 MG/ML IJ SOLN
INTRAMUSCULAR | Status: DC | PRN
  Administered 2018-09-16 (×7): 5 mg via INTRAVENOUS
  Administered 2018-09-16: 10 mg via INTRAVENOUS

## 2018-09-16 MED ORDER — ESCITALOPRAM OXALATE 10 MG PO TABS: 10.0000 mg | ORAL_TABLET | Freq: Every day | ORAL | Status: DC

## 2018-09-16 MED ORDER — ONDANSETRON HCL 4 MG/2ML IJ SOLN
INTRAMUSCULAR | Status: AC
  Filled 2018-09-16: qty 2

## 2018-09-16 MED ORDER — CELECOXIB 200 MG PO CAPS
ORAL_CAPSULE | ORAL | Status: AC
  Filled 2018-09-16: qty 1

## 2018-09-16 MED ORDER — ONDANSETRON HCL 4 MG/2ML IJ SOLN: 4.0000 mg | Freq: Four times a day (QID) | INTRAMUSCULAR | Status: DC | PRN

## 2018-09-16 MED ORDER — BUPIVACAINE-EPINEPHRINE 0.5% -1:200000 IJ SOLN
INTRAMUSCULAR | Status: DC | PRN
  Administered 2018-09-16: 13 mL

## 2018-09-16 MED ORDER — SODIUM CHLORIDE (PF) 0.9 % IJ SOLN
INTRAMUSCULAR | Status: AC
  Filled 2018-09-16: qty 10

## 2018-09-16 MED ORDER — TECHNETIUM TC 99M SULFUR COLLOID FILTERED
1.0000 | Freq: Once | INTRAVENOUS | Status: AC | PRN
  Administered 2018-09-16: 1 via INTRADERMAL

## 2018-09-16 MED ORDER — MEPERIDINE HCL 25 MG/ML IJ SOLN: 6.2500 mg | INTRAMUSCULAR | Status: DC | PRN

## 2018-09-16 MED ORDER — ONDANSETRON HCL 4 MG/2ML IJ SOLN
INTRAMUSCULAR | Status: DC | PRN
  Administered 2018-09-16: 4 mg via INTRAVENOUS

## 2018-09-16 MED ORDER — SODIUM CHLORIDE (PF) 0.9 % IJ SOLN
INTRAVENOUS | Status: DC | PRN
  Administered 2018-09-16: 08:00:00 5 mL via INTRAMUSCULAR

## 2018-09-16 MED ORDER — HYDROCODONE-ACETAMINOPHEN 5-325 MG PO TABS: 1.0000 | ORAL_TABLET | ORAL | Status: DC | PRN

## 2018-09-16 MED ORDER — ACETAMINOPHEN 160 MG/5ML PO SOLN: 325.0000 mg | ORAL | Status: DC | PRN

## 2018-09-16 MED ORDER — BUPIVACAINE-EPINEPHRINE (PF) 0.5% -1:200000 IJ SOLN
INTRAMUSCULAR | Status: AC
  Filled 2018-09-16: qty 30

## 2018-09-16 MED ORDER — OXYCODONE HCL 5 MG/5ML PO SOLN: 5.0000 mg | Freq: Once | ORAL | Status: DC | PRN

## 2018-09-16 MED ORDER — FENTANYL CITRATE (PF) 100 MCG/2ML IJ SOLN: 25.0000 ug | INTRAMUSCULAR | Status: DC | PRN

## 2018-09-16 MED ORDER — PROPOFOL 10 MG/ML IV BOLUS
INTRAVENOUS | Status: AC
  Filled 2018-09-16: qty 20

## 2018-09-16 MED ORDER — ALBUTEROL SULFATE HFA 108 (90 BASE) MCG/ACT IN AERS: 2.0000 | INHALATION_SPRAY | Freq: Four times a day (QID) | RESPIRATORY_TRACT | Status: DC | PRN

## 2018-09-16 MED ORDER — 0.9 % SODIUM CHLORIDE (POUR BTL) OPTIME
TOPICAL | Status: DC | PRN
  Administered 2018-09-16: 10:00:00 600 mL

## 2018-09-16 MED ORDER — CELECOXIB 200 MG PO CAPS
200.0000 mg | ORAL_CAPSULE | ORAL | Status: AC
  Administered 2018-09-16: 200 mg via ORAL

## 2018-09-16 MED ORDER — OXYCODONE HCL 5 MG PO TABS: 5.0000 mg | ORAL_TABLET | Freq: Once | ORAL | Status: DC | PRN

## 2018-09-16 MED ORDER — SENNA 8.6 MG PO TABS: 1.0000 | ORAL_TABLET | Freq: Two times a day (BID) | ORAL | Status: DC

## 2018-09-16 SURGICAL SUPPLY — 87 items
APPLIER CLIP 11 MED OPEN (CLIP) ×3
BAG DECANTER FOR FLEXI CONT (MISCELLANEOUS) ×3 IMPLANT
BENZOIN TINCTURE PRP APPL 2/3 (GAUZE/BANDAGES/DRESSINGS) IMPLANT
BINDER BREAST LRG (GAUZE/BANDAGES/DRESSINGS) IMPLANT
BINDER BREAST XLRG (GAUZE/BANDAGES/DRESSINGS) ×3 IMPLANT
BIOPATCH RED 1 DISK 7.0 (GAUZE/BANDAGES/DRESSINGS) IMPLANT
BLADE CLIPPER SURG (BLADE) IMPLANT
BLADE HEX COATED 2.75 (ELECTRODE) ×3 IMPLANT
BLADE SURG 10 STRL SS (BLADE) ×3 IMPLANT
BLADE SURG 15 STRL LF DISP TIS (BLADE) ×2 IMPLANT
BLADE SURG 15 STRL SS (BLADE) ×1
CANISTER SUCT 1200ML W/VALVE (MISCELLANEOUS) ×6 IMPLANT
CHLORAPREP W/TINT 26 (MISCELLANEOUS) ×6 IMPLANT
CLIP APPLIE 11 MED OPEN (CLIP) ×2 IMPLANT
COVER BACK TABLE REUSABLE LG (DRAPES) ×3 IMPLANT
COVER MAYO STAND REUSABLE (DRAPES) ×3 IMPLANT
COVER PROBE 5X48 (MISCELLANEOUS) ×1
COVER PROBE W GEL 5X96 (DRAPES) ×3 IMPLANT
COVER WAND RF STERILE (DRAPES) IMPLANT
DECANTER SPIKE VIAL GLASS SM (MISCELLANEOUS) IMPLANT
DERMABOND ADVANCED (GAUZE/BANDAGES/DRESSINGS) ×2
DERMABOND ADVANCED .7 DNX12 (GAUZE/BANDAGES/DRESSINGS) ×4 IMPLANT
DRAIN CHANNEL 19F RND (DRAIN) ×6 IMPLANT
DRAPE C-ARM 42X72 X-RAY (DRAPES) ×3 IMPLANT
DRAPE HALF SHEET 70X43 (DRAPES) ×3 IMPLANT
DRAPE LAPAROSCOPIC ABDOMINAL (DRAPES) ×6 IMPLANT
DRAPE UTILITY XL STRL (DRAPES) ×3 IMPLANT
DRSG EMULSION OIL 3X3 NADH (GAUZE/BANDAGES/DRESSINGS) IMPLANT
DRSG PAD ABDOMINAL 8X10 ST (GAUZE/BANDAGES/DRESSINGS) ×6 IMPLANT
DRSG TEGADERM 2-3/8X2-3/4 SM (GAUZE/BANDAGES/DRESSINGS) IMPLANT
DRSG TEGADERM 4X10 (GAUZE/BANDAGES/DRESSINGS) IMPLANT
DRSG TEGADERM 4X4.75 (GAUZE/BANDAGES/DRESSINGS) IMPLANT
ELECT BLADE 4.0 EZ CLEAN MEGAD (MISCELLANEOUS) ×3
ELECT REM PT RETURN 9FT ADLT (ELECTROSURGICAL) ×3
ELECTRODE BLDE 4.0 EZ CLN MEGD (MISCELLANEOUS) ×2 IMPLANT
ELECTRODE REM PT RTRN 9FT ADLT (ELECTROSURGICAL) ×2 IMPLANT
EVACUATOR SILICONE 100CC (DRAIN) ×6 IMPLANT
GAUZE SPONGE 4X4 12PLY STRL (GAUZE/BANDAGES/DRESSINGS) ×3 IMPLANT
GAUZE SPONGE 4X4 12PLY STRL LF (GAUZE/BANDAGES/DRESSINGS) IMPLANT
GLOVE BIOGEL PI IND STRL 7.0 (GLOVE) ×6 IMPLANT
GLOVE BIOGEL PI INDICATOR 7.0 (GLOVE) ×3
GLOVE ECLIPSE 6.5 STRL STRAW (GLOVE) ×3 IMPLANT
GLOVE EXAM NITRILE MD LF STRL (GLOVE) ×6 IMPLANT
GLOVE SS BIOGEL STRL SZ 7 (GLOVE) ×4 IMPLANT
GLOVE SUPERSENSE BIOGEL SZ 7 (GLOVE) ×2
GOWN STRL REUS W/ TWL LRG LVL3 (GOWN DISPOSABLE) ×6 IMPLANT
GOWN STRL REUS W/ TWL XL LVL3 (GOWN DISPOSABLE) ×4 IMPLANT
GOWN STRL REUS W/TWL LRG LVL3 (GOWN DISPOSABLE) ×3
GOWN STRL REUS W/TWL XL LVL3 (GOWN DISPOSABLE) ×2
ILLUMINATOR WAVEGUIDE N/F (MISCELLANEOUS) IMPLANT
IV CATH PLACEMENT UNIT 16 GA (IV SOLUTION) IMPLANT
IV KIT MINILOC 20X1 SAFETY (NEEDLE) IMPLANT
KIT CVR 48X5XPRB PLUP LF (MISCELLANEOUS) ×2 IMPLANT
KIT PORT POWER 8FR ISP CVUE (Port) ×3 IMPLANT
LIGHT WAVEGUIDE WIDE FLAT (MISCELLANEOUS) IMPLANT
NDL SAFETY ECLIPSE 18X1.5 (NEEDLE) IMPLANT
NEEDLE BLUNT 17GA (NEEDLE) ×3 IMPLANT
NEEDLE HYPO 18GX1.5 SHARP (NEEDLE)
NEEDLE HYPO 22GX1.5 SAFETY (NEEDLE) IMPLANT
NEEDLE HYPO 25X1 1.5 SAFETY (NEEDLE) ×6 IMPLANT
NS IRRIG 1000ML POUR BTL (IV SOLUTION) ×3 IMPLANT
PACK BASIN DAY SURGERY FS (CUSTOM PROCEDURE TRAY) ×3 IMPLANT
PAD ALCOHOL SWAB (MISCELLANEOUS) ×6 IMPLANT
PENCIL BUTTON HOLSTER BLD 10FT (ELECTRODE) ×3 IMPLANT
PIN SAFETY STERILE (MISCELLANEOUS) ×3 IMPLANT
SET SHEATH INTRODUCER 10FR (MISCELLANEOUS) IMPLANT
SHEATH COOK PEEL AWAY SET 9F (SHEATH) IMPLANT
SLEEVE SCD COMPRESS KNEE MED (MISCELLANEOUS) ×3 IMPLANT
SPONGE LAP 18X18 RF (DISPOSABLE) ×6 IMPLANT
SPONGE LAP 4X18 RFD (DISPOSABLE) IMPLANT
STAPLER VISISTAT 35W (STAPLE) IMPLANT
STRIP CLOSURE SKIN 1/2X4 (GAUZE/BANDAGES/DRESSINGS) IMPLANT
SUT ETHILON 3 0 PS 1 (SUTURE) ×6 IMPLANT
SUT MNCRL AB 4-0 PS2 18 (SUTURE) ×6 IMPLANT
SUT PROLENE 2 0 CT2 30 (SUTURE) ×3 IMPLANT
SUT SILK 2 0 PERMA HAND 18 BK (SUTURE) IMPLANT
SUT SILK 2 0 SH (SUTURE) ×3 IMPLANT
SUT VIC AB 3-0 54X BRD REEL (SUTURE) ×4 IMPLANT
SUT VIC AB 3-0 BRD 54 (SUTURE) ×2
SUT VICRYL 3-0 CR8 SH (SUTURE) ×6 IMPLANT
SYR 10ML LL (SYRINGE) ×6 IMPLANT
SYR 5ML LUER SLIP (SYRINGE) ×3 IMPLANT
TAPE CLOTH SURG 4X10 WHT LF (GAUZE/BANDAGES/DRESSINGS) IMPLANT
TOWEL GREEN STERILE FF (TOWEL DISPOSABLE) ×6 IMPLANT
TRAY DSU PREP LF (CUSTOM PROCEDURE TRAY) IMPLANT
TUBE CONNECTING 20X1/4 (TUBING) ×3 IMPLANT
YANKAUER SUCT BULB TIP NO VENT (SUCTIONS) ×3 IMPLANT

## 2018-09-16 NOTE — Anesthesia Procedure Notes (Signed)
Procedure Name: LMA Insertion Date/Time: 09/16/2018 7:46 AM Performed by: Lavonia Dana, CRNA Pre-anesthesia Checklist: Patient identified, Emergency Drugs available, Suction available and Patient being monitored Patient Re-evaluated:Patient Re-evaluated prior to induction Oxygen Delivery Method: Circle system utilized Preoxygenation: Pre-oxygenation with 100% oxygen Induction Type: IV induction Ventilation: Mask ventilation without difficulty LMA: LMA inserted LMA Size: 4.0 Number of attempts: 1 Airway Equipment and Method: Bite block Placement Confirmation: positive ETCO2 Tube secured with: Tape Dental Injury: Teeth and Oropharynx as per pre-operative assessment

## 2018-09-16 NOTE — Op Note (Signed)
Patient Name:           Dorothy Frederick   Date of Surgery:        09/16/2018  Pre op Diagnosis:      Cancer left breast                                       Remote history left breast cancer                                       History radiation therapy  Post op Diagnosis:    Same  Procedure:                 Insertion PowerPort [place] 8 French tunneled venous vascular access device                                      Use of fluoroscopy for guidance and positioning                                      Inject blue dye left breast                                      Left total mastectomy                                      Left axillary deep sentinel lymph node biopsy  Surgeon:                     Edsel Petrin. Dalbert Batman, M.D., FACS  Assistant:                      Or staff  Operative Indications:   This is a 62 year old female with recurrent cancer of the left breast. . She is here for discussion and  scheduling of definitive surgery Dr. Jana Hakim and Dr. Sondra Come are involved in her care. Rachell Cipro is her PCP. Cardiologist is Dr. Sallyanne Kuster. She has seen Dr. Iran Planas.      In the year 2000 she underwent left breast lumpectomy and sentinel node biopsy in [place]. She said one node was removed. She was 63 years old. She received adjuvant radiation therapy but not any chemotherapy. She recently presented with a palpable 1.3 cm mass in the left breast upper outer quadrant. 2 o'clock position. Middle depth. 5 cm from the nipple. This was 3 cm anterior to the old lumpectomy site. Image guided biopsy shows invasive ductal carcinoma, grade 3, receptor negative, HER-2 positive. Genetic testing is negative Ultrasound of the axilla is negative She had an MRI which shows enhancement in the right breast medially, but second look ultrasound performed at [facility] and 2 biopsies on the right showed benign fibroadenomas on the right.  No further surgery will be required on the right. She  will need adjuvant Herceptin-based chemotherapy for 1 year and she accepts that.      She has seen plastic surgery and  discussed all of her options. After considering this for the last 2 or 3 weeks she has decided against any immediate reconstruction. She states that it is too involved at this time. She knows she can have delayed reconstruction later if she wishes     Past history significant for left heart catheter for noncritical stenosis. She had an abnormal stress test which led to this. She is on statins. Stable without chest pain or shortness of breath. Baseline COPD. She has inverted T waves in her EKG she has COPD but doesn't use oxygen. Family history reveals sister diagnosed with breast cancer age 71 negative genetics. Maternal aunt had breast cancer age 51. Mother diagnosed with leukemia and heart disease. Father deceased of prostate cancer. Maternal grandmother died of gastric cancer She'll be scheduled for insertion of Port-A-Cath, left total mastectomy, left axillary sentinel node biopsy without reconstruction I discussed the indications, details, techniques, and numerous risk of the surgery with her and her daughter. . She agrees with this plan.   Operative Findings:       The Port-A-Cath was inserted through the right subclavian vein without any difficulty.  C arm imaging in the operating room showed the catheter tip to be at the cavoatrial junction without any deformity and the catheter was flushing and aspirating blood well.  The right mastectomy was uneventful other than radiation therapy changes to the tissues making them a little less compliant.  There was some generalized radioactivity and level 1 lymph nodes and so I removed all of the areas where there was radioactivity and sent that as the sentinel lymph nodes.  There were no grossly abnormal lymph nodes.  Procedure in Detail:         Following the induction of general endotracheal anesthesia the patient was placed  with her arms tucked at her sides and roll behind her shoulders.  Her neck and chest were prepped and draped in a sterile fashion.  Intravenous antibiotics were given.  Surgical timeout was performed.  0.5% Marcaine with epinephrine was used as local infiltration anesthetic.        I injected 5 cc of dilute methylene blue into the left breast subareolar area and massaged the left breast for a few minutes.        A right subclavian venipuncture was performed.  Blood return on the first pass.  Guidewire inserted easily.  Fluoroscopy was performed and showed the guidewire to be well positioned in the superior vena cava.  Using the C arm I drew a template on the chest wall to help position and measure the catheter.  A small incision was made at the wire insertion site.  A transverse incision was made below the midpoint of the right clavicle.  A subcutaneous pocket was created.  Using a tunneling device I passed the catheter from the port pocket site to the wire insertion site.  Using the template drawn the chest wall I cut the catheter 21.5 cm in length.  The catheter was secured to the port with the locking device and the port and catheter flushed with heparinized saline.  The port was secured to the pectoralis fascia with 3 interrupted sutures of 2-0 Prolene.  I then passed the dilator and peel-away sheath assembly over the guidewire without difficulty.  The wire and dilator were removed.  The catheter was threaded without resistance and the peel-away sheath removed.  The catheter flushed easily and had excellent blood return.  Fluoroscopy confirmed good positioning of the catheter tip  in the cavoatrial junction without any deformity.  The port and catheter were then flushed with concentrated heparin.  Subcutaneous tissue was closed with 3-0 Vicryl and the skin incisions closed with subcuticular 4-0 Monocryl and Dermabond.    The patient was then repositioned with her arms out to her sides.  The entire left chest  wall and axilla were prepped and draped in a sterile fashion.  Using a marking pen I planned a transverse elliptical incision.  I drew an area over the old lumpectomy site where the new cancer was.  That was above the incision.  The incision was made with a knife.  Skin flaps were raised superiorly to the infraclavicular area, laterally to the anterior border of the latissimus dorsi muscle, inferiorly to the inframammary crease and slightly below, and medially to the parasternal area.  Skin flaps were very thin in the area where the new cancer was.  The breast was dissected off of the pectoralis  major and minor muscles.  The lateral skin edge was marked with a silk suture.  Using the neoprobe I identified the area of the sentinel nodes and remove them in a single bundle.  Bleeders were controlled with 3-0 Vicryl ties.  The breast and the sentinel nodes were sent as 2 separate specimens.  Hemostasis was excellent.  The wound was irrigated with saline.  Two 98 French Blake drains were placed one up across the skin flaps and one up into the left axillary area.  These were brought out inferiorly and sutured to the skin with nylon sutures.  The mastectomy incision was closed in 2 layers.  Subcutaneous tissue was closed with interrupted 3-0 Vicryl and the skin closed with a running subcuticular 4-0 Monocryl and Dermabond.  Bulky bandage and breast binder were placed.  The patient tolerated the procedure well and was taken to PACU in stable condition.  EBL 75 cc.  Counts correct.  Complications none.  Chest x-ray is planned in PACU.     Addendum: I logged onto the PMP aware website and reviewed her prescription medication history     Haywood M. Dalbert Batman, M.D., FACS General and Minimally Invasive Surgery Breast and Colorectal Surgery  09/16/2018 9:56 AM

## 2018-09-16 NOTE — Interval H&P Note (Signed)
History and Physical Interval Note:  09/16/2018 6:24 AM  Dorothy Frederick  has presented today for surgery, with the diagnosis of LEFT BREAST CANCER RECURRENT.  The various methods of treatment have been discussed with the patient and family. After consideration of risks, benefits and other options for treatment, the patient has consented to  Procedure(s): LEFT TOTAL MASTECTOMY WITH LEFT AXILLARY DEEP SENTINEL LYMPH NODE BIOPSY INJECT BLUE DYE (Left) INSERTION PORT-A-CATH WITH ULTRASOUND (N/A) as a surgical intervention.  The patient's history has been reviewed, patient examined, no change in status, stable for surgery.  I have reviewed the patient's chart and labs.  Questions were answered to the patient's satisfaction.     Adin Hector

## 2018-09-16 NOTE — Anesthesia Preprocedure Evaluation (Signed)
Anesthesia Evaluation  Patient identified by MRN, date of birth, ID band Patient awake    Airway Mallampati: I       Dental no notable dental hx. (+) Teeth Intact   Pulmonary former smoker,    Pulmonary exam normal breath sounds clear to auscultation       Cardiovascular hypertension, Pt. on medications Normal cardiovascular exam Rhythm:Regular Rate:Normal     Neuro/Psych    GI/Hepatic GERD  Medicated,  Endo/Other  Hypothyroidism   Renal/GU   negative genitourinary   Musculoskeletal negative musculoskeletal ROS (+)   Abdominal Normal abdominal exam  (+)   Peds  Hematology negative hematology ROS (+)   Anesthesia Other Findings   Reproductive/Obstetrics                             Anesthesia Physical Anesthesia Plan  ASA: II  Anesthesia Plan: General   Post-op Pain Management:  Regional for Post-op pain   Induction: Intravenous  PONV Risk Score and Plan: 3 and Ondansetron, Dexamethasone and Midazolam  Airway Management Planned: LMA  Additional Equipment: None  Intra-op Plan:   Post-operative Plan: Extubation in OR  Informed Consent: I have reviewed the patients History and Physical, chart, labs and discussed the procedure including the risks, benefits and alternatives for the proposed anesthesia with the patient or authorized representative who has indicated his/her understanding and acceptance.     Dental advisory given  Plan Discussed with: CRNA  Anesthesia Plan Comments:         Anesthesia Quick Evaluation

## 2018-09-16 NOTE — Transfer of Care (Signed)
Immediate Anesthesia Transfer of Care Note  Patient: Dorothy Frederick  Procedure(s) Performed: LEFT TOTAL MASTECTOMY WITH LEFT AXILLARY DEEP SENTINEL LYMPH NODE BIOPSY AND INJECT BLUE DYE (Left Breast) INSERTION PORT-A-CATH (Right Chest)  Patient Location: PACU  Anesthesia Type:General  Level of Consciousness: sedated  Airway & Oxygen Therapy: Patient Spontanous Breathing and Patient connected to face mask oxygen  Post-op Assessment: Report given to RN and Post -op Vital signs reviewed and stable  Post vital signs: Reviewed and stable  Last Vitals:  Vitals Value Taken Time  BP 110/56 09/16/18 1003  Temp    Pulse 82 09/16/18 1005  Resp 14 09/16/18 1005  SpO2 100 % 09/16/18 1005  Vitals shown include unvalidated device data.  Last Pain:  Vitals:   09/16/18 0635  TempSrc: Oral  PainSc: 0-No pain      Patients Stated Pain Goal: 7 (22/17/98 1025)  Complications: No apparent anesthesia complications

## 2018-09-16 NOTE — Progress Notes (Signed)
Assisted Dr. Hatchett with left, ultrasound guided, pectoralis block. Side rails up, monitors on throughout procedure. See vital signs in flow sheet. Tolerated Procedure well. 

## 2018-09-16 NOTE — Anesthesia Procedure Notes (Signed)
Anesthesia Regional Block: Pectoralis block   Pre-Anesthetic Checklist: ,, timeout performed, Correct Patient, Correct Site, Correct Laterality, Correct Procedure, Correct Position, site marked, Risks and benefits discussed,  Surgical consent,  Pre-op evaluation,  At surgeon's request and post-op pain management  Laterality: Left  Prep: chloraprep       Needles:  Injection technique: Single-shot  Needle Type: Echogenic Stimulator Needle     Needle Length: 10cm  Needle Gauge: 21   Needle insertion depth: 2.5 cm   Additional Needles:   Procedures:,,,, ultrasound used (permanent image in chart),,,,  Narrative:  Start time: 09/16/2018 7:02 AM End time: 09/16/2018 7:07 AM Injection made incrementally with aspirations every 5 mL.  Performed by: Personally  Anesthesiologist: Lyn Hollingshead, MD

## 2018-09-16 NOTE — Anesthesia Postprocedure Evaluation (Signed)
Anesthesia Post Note  Patient: Nick Armel  Procedure(s) Performed: LEFT TOTAL MASTECTOMY WITH LEFT AXILLARY DEEP SENTINEL LYMPH NODE BIOPSY AND INJECT BLUE DYE (Left Breast) INSERTION PORT-A-CATH (Right Chest)     Patient location during evaluation: PACU Anesthesia Type: General Level of consciousness: sedated Pain management: pain level controlled Vital Signs Assessment: post-procedure vital signs reviewed and stable Respiratory status: spontaneous breathing Cardiovascular status: stable Postop Assessment: no apparent nausea or vomiting Anesthetic complications: no    Last Vitals:  Vitals:   09/16/18 1037 09/16/18 1045  BP:  138/72  Pulse: 78 74  Resp: 14 16  Temp:    SpO2: 95% 99%    Last Pain:  Vitals:   09/16/18 1037  TempSrc:   PainSc: 0-No pain   Pain Goal: Patients Stated Pain Goal: 7 (09/16/18 7471)                 Huston Foley

## 2018-09-16 NOTE — Progress Notes (Signed)
Nuc Med injections completed. Patient tolerated well. Provided emotional support.

## 2018-09-17 ENCOUNTER — Encounter (HOSPITAL_BASED_OUTPATIENT_CLINIC_OR_DEPARTMENT_OTHER): Payer: Self-pay | Admitting: General Surgery

## 2018-09-17 DIAGNOSIS — Z79899 Other long term (current) drug therapy: Secondary | ICD-10-CM | POA: Diagnosis not present

## 2018-09-17 DIAGNOSIS — Z7983 Long term (current) use of bisphosphonates: Secondary | ICD-10-CM | POA: Diagnosis not present

## 2018-09-17 DIAGNOSIS — Z171 Estrogen receptor negative status [ER-]: Secondary | ICD-10-CM | POA: Diagnosis not present

## 2018-09-17 DIAGNOSIS — E039 Hypothyroidism, unspecified: Secondary | ICD-10-CM | POA: Diagnosis not present

## 2018-09-17 DIAGNOSIS — Z8042 Family history of malignant neoplasm of prostate: Secondary | ICD-10-CM | POA: Diagnosis not present

## 2018-09-17 DIAGNOSIS — Z803 Family history of malignant neoplasm of breast: Secondary | ICD-10-CM | POA: Diagnosis not present

## 2018-09-17 DIAGNOSIS — Z923 Personal history of irradiation: Secondary | ICD-10-CM | POA: Diagnosis not present

## 2018-09-17 DIAGNOSIS — I10 Essential (primary) hypertension: Secondary | ICD-10-CM | POA: Diagnosis not present

## 2018-09-17 DIAGNOSIS — J449 Chronic obstructive pulmonary disease, unspecified: Secondary | ICD-10-CM | POA: Diagnosis not present

## 2018-09-17 DIAGNOSIS — I2510 Atherosclerotic heart disease of native coronary artery without angina pectoris: Secondary | ICD-10-CM | POA: Diagnosis not present

## 2018-09-17 DIAGNOSIS — K219 Gastro-esophageal reflux disease without esophagitis: Secondary | ICD-10-CM | POA: Diagnosis not present

## 2018-09-17 DIAGNOSIS — Z853 Personal history of malignant neoplasm of breast: Secondary | ICD-10-CM | POA: Diagnosis not present

## 2018-09-17 DIAGNOSIS — C50412 Malignant neoplasm of upper-outer quadrant of left female breast: Secondary | ICD-10-CM | POA: Diagnosis not present

## 2018-09-17 DIAGNOSIS — Z87891 Personal history of nicotine dependence: Secondary | ICD-10-CM | POA: Diagnosis not present

## 2018-09-17 DIAGNOSIS — Z79890 Hormone replacement therapy (postmenopausal): Secondary | ICD-10-CM | POA: Diagnosis not present

## 2018-09-17 MED ORDER — TRAMADOL HCL 50 MG PO TABS: 50.0000 mg | ORAL_TABLET | Freq: Four times a day (QID) | ORAL | 1 refills | Status: DC | PRN

## 2018-09-17 NOTE — Discharge Summary (Signed)
Patient ID: Dorothy Frederick [id number] 62 y.o. February 09, 1955  Admit date: 09/16/2018  Discharge date and time: No discharge date for patient encounter.  Admitting Physician: Adin Hector  Discharge Physician: Adin Hector  Admission Diagnoses: LEFT BREAST CANCER RECURRENT  Discharge Diagnoses: Recurrent cancer left breast                                         Remote history left breast cancer                                         Family history breast cancer                                         Hypothyroidism                                         Former smoker                                         COPD                                         Hypertension, essential, benign                                         Coronary artery disease, occlusive  Operations: Procedure(s): LEFT TOTAL MASTECTOMY WITH LEFT AXILLARY DEEP SENTINEL LYMPH NODE BIOPSY AND INJECT BLUE DYE INSERTION PORT-A-CATH  Admission Condition: good  Discharged Condition: good  Indication for Admission: This is a 63 year old female with recurrent cancer of the left breast. . She is here for discussion and scheduling of definitive surgery Dr. Jana Hakim and Dr. Sondra Come are involved in her care. Rachell Cipro is her PCP. Cardiologist is Dr. Sallyanne Kuster. She has seen Dr. Iran Planas. In the year 2000 she underwent left breast lumpectomy and sentinel node biopsy in [place]. She said one node was removed. She was 63 years old. She received adjuvant radiation therapy but not any chemotherapy. She recently presented with a palpable 1.3 cm mass in the left breast upper outer quadrant. 2 o'clock position. Middle depth. 5 cm from the nipple. This was 3 cm anterior to the old lumpectomy site. Image guided biopsy shows invasive ductal carcinoma, grade 3, receptor negative, HER-2 positive. Genetic testing is negative Ultrasound of the axilla is negative She had an MRI which shows enhancement in  the right breast medially, but second look ultrasound performed at [facility] and 2 biopsies on the right showed benign fibroadenomas on the right.  No further surgery will be required on the right. She will need adjuvant Herceptin-based chemotherapy for 1 year and she accepts that. She has seen plastic surgery and discussed all of her options. After considering this for the last 2 or 3 weeks she  has decided against any immediate reconstruction. She states that it is too involved at this time. She knows she can have delayed reconstruction later if she wishes Past history significant for left heart catheter for noncritical stenosis. She had an abnormal stress test which led to this. She is on statins. Stable without chest pain or shortness of breath. Baseline COPD. She has inverted T waves in her EKG she has COPD but doesn't use oxygen. Family history reveals sister diagnosed with breast cancer age 40 negative genetics. Maternal aunt had breast cancer age 67. Mother diagnosed with leukemia and heart disease. Father deceased of prostate cancer. Maternal grandmother died of gastric cancer She'll be scheduled for insertion of Port-A-Cath, left total mastectomy, left axillary sentinel node biopsy without reconstruction I discussed the indications, details, techniques, and numerous risk of the surgery with her and her daughter. . She agrees with this plan.2  Hospital Course: On the day of admission the patient was taken to the operating room.  Port-A-Cath insertion through the right subclavian vein was uneventful.  Postop chest x-ray looked good with the catheter tip at the cavoatrial junction and without complication.  She then underwent left total mastectomy and left axillary deep sentinel lymph node biopsy     She was admitted overnight for observation.  She did very well.  Pain control was excellent with tramadol.  She was ambulatory.  Voiding without difficulty.  Wanted to go home.  She  meets all discharge criteria.  Examination on postop day 1 showed that the skin flaps looked good.  Minor ecchymoses.  No hematoma.  Drainage was light, low volume, serosanguineous.  She was moving her left arm around well.  There was no sensory deficit under the left arm.     She was given instructions in diet, activities, shoulder range of motion, drain care, recordkeeping.  She was encouraged to use Tylenol and ibuprofen for pain but we will call in a prescription for Ultram to her pharmacy in case she needs it.  She will return to the office in 2 weeks for a wound and drain check.  Pathology is pending at this time  Consults: None  Significant Diagnostic Studies: Surgical pathology, pending  Treatments: surgery: Port-A-Cath insertion.  Left mastectomy.  Left sentinel node biopsy  Disposition: Home  Patient Instructions:  Allergies as of 09/17/2018      Reactions   Benadryl [diphenhydramine Hcl (sleep)] Palpitations   Increased heartrate   Sulfa Antibiotics Rash      Medication List    TAKE these medications   albuterol 108 (90 Base) MCG/ACT inhaler Commonly known as: VENTOLIN HFA Inhale 2 puffs into the lungs every 6 (six) hours as needed for wheezing or shortness of breath.   alendronate 70 MG tablet Commonly known as: FOSAMAX Take 1 tablet by mouth once a week.   atorvastatin 10 MG tablet Commonly known as: LIPITOR Take 40 mg by mouth daily.   Cholecalciferol 25 MCG (1000 UT) capsule Take 500 Units by mouth daily.   escitalopram 10 MG tablet Commonly known as: LEXAPRO Take 10 mg by mouth daily.   famotidine 40 MG tablet Commonly known as: PEPCID Take 40 mg by mouth daily.   levothyroxine 50 MCG tablet Commonly known as: SYNTHROID Take 1 tablet by mouth daily.   olmesartan 20 MG tablet Commonly known as: BENICAR Take 20 mg by mouth daily.   traMADol 50 MG tablet Commonly known as: ULTRAM Take 1 tablet (50 mg total) by mouth every 6 (six)  hours as needed  (mild pain).   Trelegy Ellipta 100-62.5-25 MCG/INH Aepb Generic drug: Fluticasone-Umeclidin-Vilant Inhale 1 puff into the lungs daily as needed.            Discharge Care Instructions  (From admission, onward)         Start     Ordered   09/17/18 0000  Discharge wound care:    Comments: Keep the wound clean and dry in general.  Keep a careful written  record of the drainage from each of the drains and bring that to the office with you  You may change the white gauze dressing as needed if it becomes soiled or wet In a few days, if you wish, you can take all the bandages off and take a quick shower If you do that, be prepared to re-bandage the wound.  A family member will need to help you with this  Alternatively, you may take a sponge bath   09/17/18 0705          Activity: Ambulate frequently.  No driving.  No heavy lifting.  No sports.  Frequent left shoulder range of motion exercises Diet: low fat, low cholesterol diet Wound Care: as directed  Follow-up:  With Dr. Dalbert Batman in 2 weeks    Addendum: I logged onto the PMP aware website and reviewed her prescription medication history.  Signed: Edsel Petrin. Dalbert Batman, M.D., FACS General and minimally invasive surgery Breast and Colorectal Surgery  09/17/2018, 7:07 AM

## 2018-09-17 NOTE — Discharge Instructions (Signed)
[facility] °[phone] ° °MASTECTOMY: POST OP INSTRUCTIONS ° °Always review your discharge instruction sheet given to you by the facility where your surgery was performed. °IF YOU HAVE DISABILITY OR FAMILY LEAVE FORMS, YOU MUST BRING THEM TO THE OFFICE FOR PROCESSING.   °DO NOT GIVE THEM TO YOUR DOCTOR. °A prescription for pain medication may be given to you upon discharge.  Take your pain medication as prescribed, if needed.  If narcotic pain medicine is not needed, then you may take acetaminophen (Tylenol) or ibuprofen (Advil) as needed. °1. Take your usually prescribed medications unless otherwise directed. °2. If you need a refill on your pain medication, please contact your pharmacy.  They will contact our office to request authorization.  Prescriptions will not be filled after 5pm or on week-ends. °3. You should follow a light diet the first few days after arrival home, such as soup and crackers, etc.  Resume your normal diet the day after surgery. °4. Most patients will experience some swelling and bruising on the chest and underarm.  Ice packs will help.  Swelling and bruising can take several days to resolve.  °5. It is common to experience some constipation if taking pain medication after surgery.  Increasing fluid intake and taking a stool softener (such as Colace) will usually help or prevent this problem from occurring.  A mild laxative (Milk of Magnesia or Miralax) should be taken according to package instructions if there are no bowel movements after 48 hours. °6. Unless discharge instructions indicate otherwise, leave your bandage dry and in place until your next appointment in 3-5 days.  You may take a limited sponge bath.  No tube baths or showers until the drains are removed.  You may have steri-strips (small skin tapes) in place directly over the incision.  These strips should be left on the skin for 7-10 days.  If your surgeon used skin glue on the incision, you may  shower in 24 hours.  The glue will flake off over the next 2-3 weeks.  Any sutures or staples will be removed at the office during your follow-up visit. °7. DRAINS:  If you have drains in place, it is important to keep a list of the amount of drainage produced each day in your drains.  Before leaving the hospital, you should be instructed on drain care.  Call our office if you have any questions about your drains. °8. ACTIVITIES:  You may resume regular (light) daily activities beginning the next day--such as daily self-care, walking, climbing stairs--gradually increasing activities as tolerated.  You may have sexual intercourse when it is comfortable.  Refrain from any heavy lifting or straining until approved by your doctor. °a. You may drive when you are no longer taking prescription pain medication, you can comfortably wear a seatbelt, and you can safely maneuver your car and apply brakes. °b. RETURN TO WORK:  __________________________________________________________ °9. You should see your doctor in the office for a follow-up appointment approximately 3-5 days after your surgery.  Your doctor’s nurse will typically make your follow-up appointment when she calls you with your pathology report.  Expect your pathology report 2-3 business days after your surgery.  You may call to check if you do not hear from us after three days.   °10. OTHER INSTRUCTIONS: ______________________________________________________________________________________________ ____________________________________________________________________________________________ °WHEN TO CALL YOUR DOCTOR: °1. Fever over 101.0 °2. Nausea and/or vomiting °3. Extreme swelling or bruising °4. Continued bleeding from incision. °5. Increased pain, redness, or drainage from the incision. °  The clinic staff is available to answer your questions during regular business hours.  Please dont hesitate to call and ask to speak to one of the nurses for clinical  concerns.  If you have a medical emergency, go to the nearest emergency room or call 911.  A surgeon from [facility] is always on call at the hospital. [address], [place], [place], [place]  [address] ? [address], [place], [place]   [address] [phone] ? [phone] ? FAX [phone] Web site: [email or web address] [phone]  MASTECTOMY: POST OP INSTRUCTIONS  Always review your discharge instruction sheet given to you by the facility where your surgery was performed. IF YOU HAVE DISABILITY OR FAMILY LEAVE FORMS, YOU MUST BRING THEM TO THE OFFICE FOR PROCESSING.   DO NOT GIVE THEM TO YOUR DOCTOR. A prescription for pain medication may be given to you upon discharge.  Take your pain medication as prescribed, if needed.  If narcotic pain medicine is not needed, then you may take acetaminophen (Tylenol) or ibuprofen (Advil) as needed. 11. Take your usually prescribed medications unless otherwise directed. 12. If you need a refill on your pain medication, please contact your pharmacy.  They will contact our office to request authorization.  Prescriptions will not be filled after 5pm or on week-ends. 13. You should follow a light diet the first few days after arrival home, such as soup and crackers, etc.  Resume your normal diet the day after surgery. 14. Most patients will experience some swelling and bruising on the chest and underarm.  Ice packs will help.  Swelling and bruising can take several days to resolve.  15. It is common to experience some constipation if taking pain medication after surgery.  Increasing fluid intake and taking a stool softener (such as Colace) will usually help or prevent this problem from occurring.  A mild laxative (Milk of Magnesia or Miralax) should be taken according to package instructions if there are no bowel movements after 48 hours. 16. Unless discharge instructions indicate otherwise, leave your bandage dry  and in place until your next appointment in 3-5 days.  You may take a limited sponge bath.  No tube baths or showers until the drains are removed.  You may have steri-strips (small skin tapes) in place directly over the incision.  These strips should be left on the skin for 7-10 days.  If your surgeon used skin glue on the incision, you may shower in 24 hours.  The glue will flake off over the next 2-3 weeks.  Any sutures or staples will be removed at the office during your follow-up visit. 35. DRAINS:  If you have drains in place, it is important to keep a list of the amount of drainage produced each day in your drains.  Before leaving the hospital, you should be instructed on drain care.  Call our office if you have any questions about your drains. 18. ACTIVITIES:  You may resume regular (light) daily activities beginning the next day--such as daily self-care, walking, climbing stairs--gradually increasing activities as tolerated.  You may have sexual intercourse when it is comfortable.  Refrain from any heavy lifting or straining until approved by your doctor. a. You may drive when you are no longer taking prescription pain medication, you can comfortably wear a seatbelt, and you can safely maneuver your car and apply brakes. b. RETURN TO WORK:  __________________________________________________________ 19. You should see your doctor in the office for a follow-up  appointment approximately 3-5 days after your surgery.  Your doctors nurse will typically make your follow-up appointment when she calls you with your pathology report.  Expect your pathology report 2-3 business days after your surgery.  You may call to check if you do not hear from [place] after three days.   20. OTHER INSTRUCTIONS: ______________________________________________________________________________________________ ____________________________________________________________________________________________ WHEN TO CALL YOUR  DOCTOR: 6. Fever over 101.0 7. Nausea and/or vomiting 8. Extreme swelling or bruising 9. Continued bleeding from incision. 10. Increased pain, redness, or drainage from the incision. The clinic staff is available to answer your questions during regular business hours.  Please dont hesitate to call and ask to speak to one of the nurses for clinical concerns.  If you have a medical emergency, go to the nearest emergency room or call 911.  A surgeon from [facility] is always on call at the hospital. [address], [place], [place], [place]  [address] ? [address], [place], [place]   [address] [phone] ? [phone] ? FAX [phone] Web site: [facility] Instructions  Activity: Get plenty of rest for the remainder of the day. A responsible individual must stay with you for 24 hours following the procedure.  For the next 24 hours, DO NOT: -Drive a car -Paediatric nurse -Drink alcoholic beverages -Take any medication unless instructed by your physician -Make any legal decisions or sign important papers.  Meals: Start with liquid foods such as gelatin or soup. Progress to regular foods as tolerated. Avoid greasy, spicy, heavy foods. If nausea and/or vomiting occur, drink only clear liquids until the nausea and/or vomiting subsides. Call your physician if vomiting continues.  Special Instructions/Symptoms: Your throat may feel dry or sore from the anesthesia or the breathing tube placed in your throat during surgery. If this causes discomfort, gargle with warm salt water. The discomfort should disappear within 24 hours.  If you had a scopolamine patch placed behind your ear for the management of post- operative nausea and/or vomiting:  1. The medication in the patch is effective for 72 hours, after which it should be removed.  Wrap patch in a tissue and discard in the trash. Wash hands thoroughly with soap and water. 2. You may  remove the patch earlier than 72 hours if you experience unpleasant side effects which may include dry mouth, dizziness or visual disturbances. 3. Avoid touching the patch. Wash your hands with soap and water after contact with the patch.      Regional Anesthesia Blocks  1. Numbness or the inability to move the "blocked" extremity may last from 3-48 hours after placement. The length of time depends on the medication injected and your individual response to the medication. If the numbness is not going away after 48 hours, call your surgeon.  2. The extremity that is blocked will need to be protected until the numbness is gone and the  Strength has returned. Because you cannot feel it, you will need to take extra care to avoid injury. Because it may be weak, you may have difficulty moving it or using it. You may not know what position it is in without looking at it while the block is in effect.  3. For blocks in the legs and feet, returning to weight bearing and walking needs to be done carefully. You will need to wait until the numbness is entirely gone and the strength has returned. You should be able to move your leg and  foot normally before you try and bear weight or walk. You will need someone to be with you when you first try to ensure you do not fall and possibly risk injury.  4. Bruising and tenderness at the needle site are common side effects and will resolve in a few days.  5. Persistent numbness or new problems with movement should be communicated to the surgeon or the [facility] [phone] [place] [phone]).   About my Jackson-Pratt Bulb Drain  What is a Jackson-Pratt bulb? A Jackson-Pratt is a soft, round device used to collect drainage. It is connected to a long, thin drainage catheter, which is held in place by one or two small stiches near your surgical incision site. When the bulb is squeezed, it forms a vacuum, forcing the drainage to  empty into the bulb.  Emptying the Jackson-Pratt bulb- To empty the bulb: 1. Release the plug on the top of the bulb. 2. Pour the bulb's contents into a measuring container which your nurse will provide. 3. Record the time emptied and amount of drainage. Empty the drain(s) as often as your     doctor or nurse recommends.  Date                  Time                    Amount (Drain 1)                 Amount (Drain 2)  _____________________________________________________________________  _____________________________________________________________________  _____________________________________________________________________  _____________________________________________________________________  _____________________________________________________________________  _____________________________________________________________________  _____________________________________________________________________  _____________________________________________________________________  Squeezing the Jackson-Pratt Bulb- To squeeze the bulb: 1. Make sure the plug at the top of the bulb is open. 2. Squeeze the bulb tightly in your fist. You will hear air squeezing from the bulb. 3. Replace the plug while the bulb is squeezed. 4. Use a safety pin to attach the bulb to your clothing. This will keep the catheter from     pulling at the bulb insertion site.  When to call your doctor- Call your doctor if:  Drain site becomes red, swollen or hot.  You have a fever greater than 101 degrees F.  There is oozing at the drain site.  Drain falls out (apply a guaze bandage over the drain hole and secure it with tape).  Drainage increases daily not related to activity patterns. (You will usually have more drainage when you are active than when you are resting.)  Drainage has a bad odor.

## 2018-09-22 ENCOUNTER — Other Ambulatory Visit: Payer: BC Managed Care – PPO

## 2018-09-22 ENCOUNTER — Ambulatory Visit: Payer: BC Managed Care – PPO

## 2018-09-24 ENCOUNTER — Telehealth: Payer: Self-pay | Admitting: Oncology

## 2018-09-24 NOTE — Telephone Encounter (Signed)
Scheduled appt per 9/2 sch message - pt to get an updated schedule next visit.

## 2018-09-25 DIAGNOSIS — C50412 Malignant neoplasm of upper-outer quadrant of left female breast: Secondary | ICD-10-CM | POA: Diagnosis not present

## 2018-10-12 NOTE — Progress Notes (Signed)
Dorothy Frederick  [phone] [phone]    ID: Dorothy Frederick DOB: 01-07-1956  MR#: [id number]  OTL#:[id number]  Patient Care Team: [name], MD as PCP - General (Family Medicine) Skeet Latch, MD as PCP - Cardiology (Cardiology) Mauro Kaufmann, RN as Oncology Nurse Navigator Rockwell Germany, RN as Oncology Nurse Navigator Fanny Skates, MD as Consulting Physician (General Surgery) , Virgie Dad, MD as Consulting Physician (Oncology) Gery Pray, MD as Consulting Physician (Radiation Oncology) Croitoru, Dani Gobble, MD as Consulting Physician (Cardiology) OTHER MD:   CHIEF COMPLAINT: Estrogen receptor negative breast cancer  CURRENT TREATMENT: Neoadjuvant chemotherapy and anti-HER-2 immunotherapy   HISTORY OF CURRENT ILLNESS: From the original intake note:  Dorothy Frederick has a history of left breast cancer diagnosed in 2000, and treated with a partial mastectomy.  She has been followed by Dr. Matthew Saras in [facility]. She was also treated with Tamoxifen for 1.5 years, which she tolerated well, but had to discontinue due to financial difficulties. She did not receive chemotherapy. She notes that her cancer was lymph node negative.  More recently, she had routine screening mammography on 07/06/2018 showing: Breast Density Category B. On mammography, there is a 1.5 cm oval mass with associated calcifications in the left breast upper outer quadrant posterior depth 6.6 cm from the nipple. This mass is approximately 3 cm anterior to the lumpectomy site in the upper outer posterior left breast. No other significant masses, calcifications, or other findings are seen in either breast.   Accordingly on 07/13/2018 she proceeded to biopsy of the left breast area in question. The pathology from this procedure showed ([id number]): invasive ductal carcinoma, grade III, 2 o'clock, 5 cm from the nipple. Prognostic indicators significant for: estrogen  receptor, 0% negative and progesterone receptor, 0% negative. Proliferation marker Ki67 at 70%. HER2 positive (3+) by immunohistochemistry.  The patient's subsequent history is as detailed below.   INTERVAL HISTORY: Dorothy Frederick returns today for follow-up and treatment of her estrogen receptor negative breast cancer. She was last seen here on 07/22/2018.   Since her last visit here, genetic testing on 07/22/2018 through the [facility] Common Hereditary cancer panel found no pathogenic mutations.in APC, ATM, AXIN2, BARD1, BMPR1A, BRCA1, BRCA2, BRIP1, CDH1, CDKN2A (p14ARF), CDKN2A (p16INK4a), CKD4, CHEK2, CTNNA1, DICER1, EPCAM (Deletion/duplication testing only), GREM1 (promoter region deletion/duplication testing only), KIT, MEN1, MLH1, MSH2, MSH3, MSH6, MUTYH, NBN, NF1, NHTL1, PALB2, PDGFRA, PMS2, POLD1, POLE, PTEN, RAD50, RAD51C, RAD51D, SDHB, SDHC, SDHD, SMAD4, SMARCA4. STK11, TP53, TSC1, TSC2, and VHL.  The following genes were evaluated for sequence changes only: SDHA and HOXB13 c.251G>A variant only.  She underwent a bilateral breast MRI with and without contrast on 08/08/2018 showing: Breast Density Category B. Known malignancy within the UPPER-OUTER QUADRANT of the LEFT breast measuring 2.5 centimeters. No additional areas of concern in the LEFT breast. 1.7 centimeter mass within the central portion of the RIGHT breast, middle depth and slightly MEDIAL to the nipple is indeterminate and warrants tissue diagnosis.  She also underwent a Unilateral right breast ultrasound on 08/18/2018 at [facility] showing: There is a 0.7 cm oval mass with a circumscribed margin in the right breast at 5 o'clock posterior depth 3 cm from the nipple. This oval mass is hypoechoic. Color flow imagine demonstrates that there is no vascularity present. Elastography imagine assessment is soft. There is also a 0.8 round mass with an indistinct margin in the right breast at 2:30 middle depth 3cm from the nipple. This round mass is  hypoechoic with  no posterior acoustic shadowing or enhancement. This correlates with breast MRI findings. Color flow imaging demonstrates that there is no vascularity present. No significant abnormalities in the right axilla.   She then underwent a biopsy of the right breast areas in question on 08/24/2018. The pathology from this procedure showed ([id number]): 1. Breast, right, needle core biopsy, 2:30 o'clock, 3cmfn - fibroadenoma. - no malignancy identified. 2. Breast, right, needle core biopsy, 5 o'clock, 3cmfn - fibroadenoma.   - no malignancy identified.   She underwent a chest xray on 09/09/2018 showing: no acute abnormalities.  Finally, she underwent a right mastectomy on 09/16/2018 under Dr. Fanny Skates. The pathology from this procedure showed ([id number]): 1. Breast, simple mastectomy, left - invasive ductal carcinoma, grade 3, spanning 2.5 cm. - high grade ductal carcinoma in situ. - resection margins are negative. - scar. - see oncology table. 2. Lymph node, sentinel, biopsy, left axillary deep - one of one lymph nodes negative for carcinoma (0/1). 3. Lymph node, sentinel, biopsy, left - one of one lymph nodes negative for carcinoma (0/1). 4. Lymph node, sentinel, biopsy, left - one of one lymph nodes negative for carcinoma (0/1). 5. Lymph node, sentinel, biopsy, left - one of one lymph nodes negative for carcinoma (0/1). 6. Lymph node, sentinel, biopsy, left - one of one lymph nodes negative for carcinoma (0/1). 7. Lymph node, sentinel, biopsy, left   - one of one lymph nodes negative for carcinoma (0/1).   REVIEW OF SYSTEMS: Dorothy Frederick did well with her surgery, with no significant problems with pain bleeding or fever.  She is not contemplating reconstruction.  She has been given a prescription for bras and prostheses by Dr. Dalbert Batman.  She has started a walking program and is already walking a quarter of a mile at a time.  She is planning to go back to work in [employer]  next week.  Mostly she will work from home but part of the time she will be working in [place] on site.  She has cut carbs from her diet and is hoping to lose a little bit of weight that way.  Aside from these issues a detailed review of systems today was benign   PAST MEDICAL HISTORY: Past Medical History:  Diagnosis Date  . Angina at rest [facility])   . Anxiety   . Cancer [facility]) 2000   left breast cancer-partial mastectomy  . COPD (chronic obstructive pulmonary disease) ([place])   . Depression   . Family history of breast cancer   . Family history of esophageal cancer   . Family history of lung cancer   . Family history of prostate cancer   . GERD (gastroesophageal reflux disease)   . Hyperlipidemia   . Hypertension   . Hypothyroidism   . Recurrent breast cancer, left ([place])      PAST SURGICAL HISTORY: Past Surgical History:  Procedure Laterality Date  . ADENOIDECTOMY    . LEFT HEART CATHETERIZATION WITH CORONARY ANGIOGRAM N/A 10/21/2013   Procedure: LEFT HEART CATHETERIZATION WITH CORONARY ANGIOGRAM;  Surgeon: Burnell Blanks, MD;  Location: [facility] CATH LAB;  Service: Cardiovascular;  Laterality: N/A;  . MASTECTOMY W/ SENTINEL NODE BIOPSY Left 09/16/2018   Procedure: LEFT TOTAL MASTECTOMY WITH LEFT AXILLARY DEEP SENTINEL LYMPH NODE BIOPSY AND INJECT BLUE DYE;  Surgeon: Fanny Skates, MD;  Location: [place];  Service: General;  Laterality: Left;  Marland Kitchen MASTECTOMY, PARTIAL Left 2000  . PORTACATH PLACEMENT Right 09/16/2018   Procedure: INSERTION PORT-A-CATH;  Surgeon: Fanny Skates,  MD;  Location: [place];  Service: General;  Laterality: Right;  . TONSILLECTOMY       FAMILY HISTORY: Family History  Problem Relation Age of Onset  . Heart disease Mother   . Hypertension Father   . Cancer Father   . Prostate cancer Father   . Hypertension Sister   . Breast cancer Sister        dx 42s  . Heart disease Sister   . Hypertension Brother   .  Hyperlipidemia Maternal Grandmother   . Stroke Maternal Grandmother   . Hypertension Brother   . Breast cancer Maternal Aunt   . Lung cancer Maternal Aunt   . Esophageal cancer Maternal Aunt   . Stomach cancer Paternal Grandmother    Dorothy Frederick father died from prostate cancer at age 14. Patients' mother died from heart complications at age 45. The patient has 2 half brothers and 2 half sisters. Patient denies anyone in her family having ovarian, prostate, or pancreatic cancer. 1 maternal aunt and 35 half aunt (half-sister of the patient's mother) had breast cancer breast cancer.    GYNECOLOGIC HISTORY:  No LMP recorded. Patient is postmenopausal. 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 She is divorced. She lives with her daughter and two grandchildren. Her daughter, Judson Roch, is 8 and works for [employer] as a Clinical research associate.  Dorothy Frederick children Piper, 13, and Conner, 10 also live with the patient. Dorothy Frederick other daughter, Sharyn Lull, is a Licensed conveyancer at [employer]. Lareta has 4 grandchildren in total.   ADVANCED DIRECTIVES:  Not in place. Dorothy Frederick was given the appropriate forms on 07/22/18 to fill out and return at their own discretion.     HEALTH MAINTENANCE: Social History   Tobacco Use  . Smoking status: Former Smoker    Packs/day: 1.00    Years: 38.00    Pack years: 38.00    Types: Cigarettes  . Smokeless tobacco: Never Used  Substance Use Topics  . Alcohol use: No  . Drug use: No    Colonoscopy: 2017  PAP:    Bone density: yes, [facility]; osteoporosis Mammography: up to date  Allergies  Allergen Reactions  . Benadryl [Diphenhydramine Hcl (Sleep)] Palpitations    Increased heartrate  . Sulfa Antibiotics Rash    Current Outpatient Medications  Medication Sig Dispense Refill   . albuterol (PROVENTIL HFA;VENTOLIN HFA) 108 (90 BASE) MCG/ACT inhaler Inhale 2 puffs into the lungs every 6 (six) hours as needed for wheezing or shortness of breath.    Marland Kitchen alendronate (FOSAMAX) 70 MG tablet Take 1 tablet by mouth once a week.    Marland Kitchen atorvastatin (LIPITOR) 10 MG tablet Take 40 mg by mouth daily.     . Cholecalciferol 1000 UNITS capsule Take 500 Units by mouth daily.     Marland Kitchen escitalopram (LEXAPRO) 10 MG tablet Take 10 mg by mouth daily.  3  . famotidine (PEPCID) 40 MG tablet Take 40 mg by mouth daily.    . Fluticasone-Umeclidin-Vilant (TRELEGY ELLIPTA) 100-62.5-25 MCG/INH AEPB Inhale 1 puff into the lungs daily as needed.    Marland Kitchen levothyroxine (SYNTHROID, LEVOTHROID) 50 MCG tablet Take 1 tablet by mouth daily.    Marland Kitchen olmesartan (BENICAR) 20 MG tablet Take 20 mg by mouth daily.  11  . traMADol (ULTRAM) 50  MG tablet Take 1 tablet (50 mg total) by mouth every 6 (six) hours as needed (mild pain). 20 tablet 1   No current facility-administered medications for this visit.      OBJECTIVE: Middle-aged white woman in no acute distress  Vitals:   10/13/18 1603  BP: 95/79  Pulse: 86  Resp: 18  Temp: 99.1 F (37.3 C)  SpO2: 97%   Wt Readings from Last 3 Encounters:  10/13/18 173 lb 4.8 oz (78.6 kg)  09/16/18 171 lb 15.3 oz (78 kg)  08/10/18 171 lb (77.6 kg)   Body mass index is 29.75 kg/m.    ECOG FS:1 - Symptomatic but completely ambulatory  Ocular: Sclerae unicteric, pupils round and equal Ear-nose-throat: Wearing a mask Lymphatic: No cervical or supraclavicular adenopathy Lungs no rales or rhonchi Heart regular rate and rhythm Abd soft, nontender, positive bowel sounds MSK no focal spinal tenderness, no joint edema Neuro: non-focal, well-oriented, appropriate affect Breasts: The right breast is benign.  The left breast is status post recent mastectomy.  The incision is healing well, although there appears to be a small amount of fluid underlying the incision, with no  erythema or tenderness and no dehiscence.  Both axillae are benign.   LAB RESULTS:  CMP     Component Value Date/Time   NA 142 10/13/2018 1540   K 4.1 10/13/2018 1540   CL 106 10/13/2018 1540   CO2 25 10/13/2018 1540   GLUCOSE 96 10/13/2018 1540   BUN 14 10/13/2018 1540   CREATININE 1.04 (H) 10/13/2018 1540   CREATININE 1.11 (H) 07/22/2018 0835   CREATININE 0.97 10/18/2013 1527   CALCIUM 9.2 10/13/2018 1540   PROT 7.1 10/13/2018 1540   ALBUMIN 3.6 10/13/2018 1540   AST 14 (L) 10/13/2018 1540   AST 17 07/22/2018 0835   ALT 12 10/13/2018 1540   ALT 20 07/22/2018 0835   ALKPHOS 110 10/13/2018 1540   BILITOT 0.4 10/13/2018 1540   BILITOT 0.6 07/22/2018 0835   GFRNONAA 57 (L) 10/13/2018 1540   GFRNONAA 53 (L) 07/22/2018 0835   GFRAA >60 10/13/2018 1540   GFRAA >60 07/22/2018 0835    No results found for: TOTALPROTELP, ALBUMINELP, A1GS, A2GS, BETS, BETA2SER, GAMS, MSPIKE, SPEI  No results found for: KPAFRELGTCHN, LAMBDASER, KAPLAMBRATIO  Lab Results  Component Value Date   WBC 5.1 10/13/2018   NEUTROABS 3.1 10/13/2018   HGB 11.6 (L) 10/13/2018   HCT 36.4 10/13/2018   MCV 89.0 10/13/2018   PLT 329 10/13/2018    _0 @  Lab Results  Component Value Date   LABCA2 24 09/08/2006    No components found for: [id number]  No results for input(s): INR in the last 168 hours.  Lab Results  Component Value Date   LABCA2 24 09/08/2006    No results found for: BJS283  No results found for: TDV761  No results found for: YWV371  No results found for: CA2729  No components found for: HGQUANT  No results found for: CEA1 / No results found for: CEA1   No results found for: AFPTUMOR  No results found for: [place]  No results found for: PSA1  Appointment on 10/13/2018  Component Date Value Ref Range Status  . Sodium 10/13/2018 142  135 - 145 mmol/L Final  . Potassium 10/13/2018 4.1  3.5 - 5.1 mmol/L Final  . Chloride 10/13/2018 106  98 - 111 mmol/L  Final  . CO2 10/13/2018 25  22 - 32 mmol/L Final  . Glucose, Bld 10/13/2018 96  70 - 99 mg/dL Final  . BUN 10/13/2018 14  8 - 23 mg/dL Final  . Creatinine, Ser 10/13/2018 1.04* 0.44 - 1.00 mg/dL Final  . Calcium 10/13/2018 9.2  8.9 - 10.3 mg/dL Final  . Total Protein 10/13/2018 7.1  6.5 - 8.1 g/dL Final  . Albumin 10/13/2018 3.6  3.5 - 5.0 g/dL Final  . AST 10/13/2018 14* 15 - 41 U/L Final  . ALT 10/13/2018 12  0 - 44 U/L Final  . Alkaline Phosphatase 10/13/2018 110  38 - 126 U/L Final  . Total Bilirubin 10/13/2018 0.4  0.3 - 1.2 mg/dL Final  . GFR calc non Af Amer 10/13/2018 57* >60 mL/min Final  . GFR calc Af Amer 10/13/2018 >60  >60 mL/min Final  . Anion gap 10/13/2018 11  5 - 15 Final   Performed at [facility], [place] [address] [place], [place] [address]  . WBC 10/13/2018 5.1  4.0 - 10.5 K/uL Final  . RBC 10/13/2018 4.09  3.87 - 5.11 MIL/uL Final  . Hemoglobin 10/13/2018 11.6* 12.0 - 15.0 g/dL Final  . HCT 10/13/2018 36.4  36.0 - 46.0 % Final  . MCV 10/13/2018 89.0  80.0 - 100.0 fL Final  . MCH 10/13/2018 28.4  26.0 - 34.0 pg Final  . MCHC 10/13/2018 31.9  30.0 - 36.0 g/dL Final  . RDW 10/13/2018 12.8  11.5 - 15.5 % Final  . Platelets 10/13/2018 329  150 - 400 K/uL Final  . nRBC 10/13/2018 0.0  0.0 - 0.2 % Final  . Neutrophils Relative % 10/13/2018 60  % Final  . Neutro Abs 10/13/2018 3.1  1.7 - 7.7 K/uL Final  . Lymphocytes Relative 10/13/2018 24  % Final  . Lymphs Abs 10/13/2018 1.2  0.7 - 4.0 K/uL Final  . Monocytes Relative 10/13/2018 11  % Final  . Monocytes Absolute 10/13/2018 0.6  0.1 - 1.0 K/uL Final  . Eosinophils Relative 10/13/2018 4  % Final  . Eosinophils Absolute 10/13/2018 0.2  0.0 - 0.5 K/uL Final  . Basophils Relative 10/13/2018 1  % Final  . Basophils Absolute 10/13/2018 0.0  0.0 - 0.1 K/uL Final  . Immature Granulocytes 10/13/2018 0  % Final  . Abs Immature Granulocytes 10/13/2018 0.02  0.00 - 0.07 K/uL Final   Performed at [facility], [place] [address] [place], [place] [address]    (this displays the last labs from the last 3 days)  No results found for: TOTALPROTELP, ALBUMINELP, A1GS, A2GS, BETS, BETA2SER, GAMS, MSPIKE, SPEI (this displays SPEP labs)  No results found for: KPAFRELGTCHN, LAMBDASER, KAPLAMBRATIO (kappa/lambda light chains)  No results found for: HGBA, HGBA2QUANT, HGBFQUANT, HGBSQUAN (Hemoglobinopathy evaluation)   No results found for: LDH  No results found for: IRON, TIBC, IRONPCTSAT (Iron and TIBC)  No results found for: FERRITIN  Urinalysis No results found for: COLORURINE, APPEARANCEUR, LABSPEC, PHURINE, GLUCOSEU, HGBUR, BILIRUBINUR, KETONESUR, PROTEINUR, UROBILINOGEN, NITRITE, LEUKOCYTESUR   STUDIES:  Nm Sentinel Node Inj-no Rpt (breast)  Result Date: 09/16/2018 Sulfur colloid was injected by the nuclear medicine technologist for melanoma sentinel node.   Dg Chest Port 1 View  Result Date: 09/16/2018 CLINICAL DATA:  Port-A-Cath insertion EXAM: PORTABLE CHEST 1 VIEW COMPARISON:  09/09/2018 FINDINGS: Right jugular Port-A-Cath is in place with the tip at the cavoatrial junction. No pneumothorax. Cardiac enlargement without heart failure edema or effusion. Mild bibasilar atelectasis due to hypoventilation Surgical drains and postsurgical gas in the soft tissues of the left breast.  IMPRESSION: Port-A-Cath tip cavoatrial junction without pneumothorax Progression of bibasilar atelectasis. Electronically Signed   By: Franchot Gallo M.D.   On: 09/16/2018 10:55   Dg Fluoro Guide Cv Line-no Report  Result Date: 09/16/2018 Fluoroscopy was utilized by the requesting physician.  No radiographic interpretation.     ELIGIBLE FOR AVAILABLE RESEARCH PROTOCOL: no   ASSESSMENT: 63 y.o. [place], [place] woman status post left breast upper outer quadrant biopsy 07/13/2018 for a clinical T1c N0, stage IA invasive ductal carcinoma, grade 3, estrogen and progesterone receptor  negative, HER-2 amplified, with an MIB-1 of 70%  (1) status post left mastectomy with sentinel lymph node sampling 09/16/2018 for a pT2, pN0, stage IIA invasive ductal carcinoma, grade 3, with negative margins  (a) a total of 6 sentinel lymph nodes were removed  (b) the patient is not planning on reconstruction  (2) adjuvant chemotherapy to consist of paclitaxel and trastuzumab weekly x12 to start 10/22/2018  (3) trastuzumab to be continued every 21 days to total 1 year  (a) echo 07/27/2018 shows an ejection fraction in the 55-60% range  (4) no indication for postmastectomy radiation  (5) genetic testing on 07/22/2018 through the [facility] Common Hereditary cancer panel found no pathogenic mutations.in APC, ATM, AXIN2, BARD1, BMPR1A, BRCA1, BRCA2, BRIP1, CDH1, CDKN2A (p14ARF), CDKN2A (p16INK4a), CKD4, CHEK2, CTNNA1, DICER1, EPCAM (Deletion/duplication testing only), GREM1 (promoter region deletion/duplication testing only), KIT, MEN1, MLH1, MSH2, MSH3, MSH6, MUTYH, NBN, NF1, NHTL1, PALB2, PDGFRA, PMS2, POLD1, POLE, PTEN, RAD50, RAD51C, RAD51D, SDHB, SDHC, SDHD, SMAD4, SMARCA4. STK11, TP53, TSC1, TSC2, and VHL.  The following genes were evaluated for sequence changes only: SDHA and HOXB13 c.251G>A variant only.   PLAN: Dorothy Frederick did very well with her surgery.  Her port site is intact and she is ready to start chemotherapy.  Today we again discussed the possible toxicities side effects and complications of paclitaxel and trastuzumab, with specific attention to the possibility of weakening of the heart muscle or damage to nerve tips, which may be permanent.  She has also been scheduled to meet with our chemotherapy teaching nurse later this week.  The chemotherapy and immunotherapy orders were entered today.  She would like to start chemotherapy as soon as possible.  October 1 will be her first day and we will see her again a week later to make sure she tolerated the first treatment without any  complications.  I commended her new exercise program and encouraged her to extended as tolerated.  She is keeping appropriate pandemic precautions  She knows to call for any other issue that may develop before the next visit.  , Virgie Dad, MD  10/13/18 6:00 PM Medical Oncology and Hematology [facility] [place], [place] [address] Tel. [phone]    Fax. [phone]  I, Jacqualyn Posey am acting as a Education administrator for Chauncey Cruel, MD.   I, Lurline Del MD, have reviewed the above documentation for accuracy and completeness, and I agree with the above.

## 2018-10-13 ENCOUNTER — Other Ambulatory Visit: Payer: Self-pay

## 2018-10-13 ENCOUNTER — Inpatient Hospital Stay: Payer: BC Managed Care – PPO | Attending: Oncology | Admitting: Oncology

## 2018-10-13 ENCOUNTER — Inpatient Hospital Stay: Payer: BC Managed Care – PPO

## 2018-10-13 VITALS — BP 95/79 | HR 86 | Temp 99.1°F | Resp 18 | Ht 64.0 in | Wt 173.3 lb

## 2018-10-13 DIAGNOSIS — I10 Essential (primary) hypertension: Secondary | ICD-10-CM | POA: Insufficient documentation

## 2018-10-13 DIAGNOSIS — Z9011 Acquired absence of right breast and nipple: Secondary | ICD-10-CM | POA: Diagnosis not present

## 2018-10-13 DIAGNOSIS — Z801 Family history of malignant neoplasm of trachea, bronchus and lung: Secondary | ICD-10-CM | POA: Diagnosis not present

## 2018-10-13 DIAGNOSIS — Z800 Family history of malignant neoplasm of digestive organs: Secondary | ICD-10-CM | POA: Diagnosis not present

## 2018-10-13 DIAGNOSIS — E7800 Pure hypercholesterolemia, unspecified: Secondary | ICD-10-CM | POA: Diagnosis not present

## 2018-10-13 DIAGNOSIS — I158 Other secondary hypertension: Secondary | ICD-10-CM

## 2018-10-13 DIAGNOSIS — N184 Chronic kidney disease, stage 4 (severe): Secondary | ICD-10-CM | POA: Insufficient documentation

## 2018-10-13 DIAGNOSIS — Z5112 Encounter for antineoplastic immunotherapy: Secondary | ICD-10-CM | POA: Insufficient documentation

## 2018-10-13 DIAGNOSIS — Z79899 Other long term (current) drug therapy: Secondary | ICD-10-CM | POA: Insufficient documentation

## 2018-10-13 DIAGNOSIS — Z803 Family history of malignant neoplasm of breast: Secondary | ICD-10-CM | POA: Diagnosis not present

## 2018-10-13 DIAGNOSIS — Z853 Personal history of malignant neoplasm of breast: Secondary | ICD-10-CM | POA: Insufficient documentation

## 2018-10-13 DIAGNOSIS — Z8042 Family history of malignant neoplasm of prostate: Secondary | ICD-10-CM | POA: Insufficient documentation

## 2018-10-13 DIAGNOSIS — F419 Anxiety disorder, unspecified: Secondary | ICD-10-CM | POA: Insufficient documentation

## 2018-10-13 DIAGNOSIS — C50412 Malignant neoplasm of upper-outer quadrant of left female breast: Secondary | ICD-10-CM

## 2018-10-13 DIAGNOSIS — Z171 Estrogen receptor negative status [ER-]: Secondary | ICD-10-CM | POA: Insufficient documentation

## 2018-10-13 DIAGNOSIS — Z87891 Personal history of nicotine dependence: Secondary | ICD-10-CM | POA: Insufficient documentation

## 2018-10-13 DIAGNOSIS — J449 Chronic obstructive pulmonary disease, unspecified: Secondary | ICD-10-CM | POA: Diagnosis not present

## 2018-10-13 DIAGNOSIS — F329 Major depressive disorder, single episode, unspecified: Secondary | ICD-10-CM | POA: Insufficient documentation

## 2018-10-13 DIAGNOSIS — Z5111 Encounter for antineoplastic chemotherapy: Secondary | ICD-10-CM | POA: Insufficient documentation

## 2018-10-13 DIAGNOSIS — Z170 Estrogen receptor positive status [ER+]: Secondary | ICD-10-CM | POA: Insufficient documentation

## 2018-10-13 LAB — CBC WITH DIFFERENTIAL/PLATELET
Abs Immature Granulocytes: 0.02 10*3/uL (ref 0.00–0.07)
Basophils Absolute: 0 10*3/uL (ref 0.0–0.1)
Basophils Relative: 1 %
Eosinophils Absolute: 0.2 10*3/uL (ref 0.0–0.5)
Eosinophils Relative: 4 %
HCT: 36.4 % (ref 36.0–46.0)
Hemoglobin: 11.6 g/dL — ABNORMAL LOW (ref 12.0–15.0)
Immature Granulocytes: 0 %
Lymphocytes Relative: 24 %
Lymphs Abs: 1.2 10*3/uL (ref 0.7–4.0)
MCH: 28.4 pg (ref 26.0–34.0)
MCHC: 31.9 g/dL (ref 30.0–36.0)
MCV: 89 fL (ref 80.0–100.0)
Monocytes Absolute: 0.6 10*3/uL (ref 0.1–1.0)
Monocytes Relative: 11 %
Neutro Abs: 3.1 10*3/uL (ref 1.7–7.7)
Neutrophils Relative %: 60 %
Platelets: 329 10*3/uL (ref 150–400)
RBC: 4.09 MIL/uL (ref 3.87–5.11)
RDW: 12.8 % (ref 11.5–15.5)
WBC: 5.1 10*3/uL (ref 4.0–10.5)
nRBC: 0 % (ref 0.0–0.2)

## 2018-10-13 LAB — COMPREHENSIVE METABOLIC PANEL
ALT: 12 U/L (ref 0–44)
AST: 14 U/L — ABNORMAL LOW (ref 15–41)
Albumin: 3.6 g/dL (ref 3.5–5.0)
Alkaline Phosphatase: 110 U/L (ref 38–126)
Anion gap: 11 (ref 5–15)
BUN: 14 mg/dL (ref 8–23)
CO2: 25 mmol/L (ref 22–32)
Calcium: 9.2 mg/dL (ref 8.9–10.3)
Chloride: 106 mmol/L (ref 98–111)
Creatinine, Ser: 1.04 mg/dL — ABNORMAL HIGH (ref 0.44–1.00)
GFR calc Af Amer: >60 mL/min (ref >60)
GFR calc non Af Amer: 57 mL/min — ABNORMAL LOW (ref >60)
Glucose, Bld: 96 mg/dL (ref 70–99)
Potassium: 4.1 mmol/L (ref 3.5–5.1)
Sodium: 142 mmol/L (ref 135–145)
Total Bilirubin: 0.4 mg/dL (ref 0.3–1.2)
Total Protein: 7.1 g/dL (ref 6.5–8.1)

## 2018-10-13 MED ORDER — ONDANSETRON HCL 8 MG PO TABS: 8.0000 mg | ORAL_TABLET | Freq: Two times a day (BID) | ORAL | 1 refills | Status: DC | PRN

## 2018-10-13 MED ORDER — LORAZEPAM 0.5 MG PO TABS: 0.5000 mg | ORAL_TABLET | Freq: Every evening | ORAL | 0 refills | Status: DC | PRN

## 2018-10-13 MED ORDER — LIDOCAINE-PRILOCAINE 2.5-2.5 % EX CREA: TOPICAL_CREAM | CUTANEOUS | 3 refills | Status: DC

## 2018-10-13 NOTE — Progress Notes (Signed)
START ON PATHWAY REGIMEN - Breast   Paclitaxel Weekly + Trastuzumab Weekly:   Administer weekly:     Paclitaxel      Trastuzumab-xxxx      Trastuzumab-xxxx   **Always confirm dose/schedule in your pharmacy ordering system**  Trastuzumab (Maintenance - NO Loading Dose):   A cycle is every 21 days:     Trastuzumab-xxxx   **Always confirm dose/schedule in your pharmacy ordering system**  Patient Characteristics: Postoperative without Neoadjuvant Therapy (Pathologic Staging), Invasive Disease, Adjuvant Therapy, HER2 Positive, ER Negative/Unknown, Node Negative, pT2, pN0, Tumor Size ?  3 cm Therapeutic Status: Postoperative without Neoadjuvant Therapy (Pathologic Staging) AJCC Grade: G3 AJCC N Category: pN0 AJCC M Category: cM0 ER Status: Negative (-) AJCC 8 Stage Grouping: IIA HER2 Status: Positive (+) Oncotype Dx Recurrence Score: Not Appropriate AJCC T Category: pT2 PR Status: Negative (-) Intent of Therapy: Curative Intent, Discussed with Patient 

## 2018-10-14 ENCOUNTER — Telehealth: Payer: Self-pay | Admitting: Oncology

## 2018-10-14 NOTE — Telephone Encounter (Signed)
I talk with patient regarding schedule  

## 2018-10-15 ENCOUNTER — Inpatient Hospital Stay: Payer: BC Managed Care – PPO

## 2018-10-15 ENCOUNTER — Other Ambulatory Visit: Payer: Self-pay

## 2018-10-16 ENCOUNTER — Encounter: Payer: Self-pay | Admitting: *Deleted

## 2018-10-19 ENCOUNTER — Encounter: Payer: Self-pay | Admitting: Physical Therapy

## 2018-10-19 ENCOUNTER — Ambulatory Visit: Payer: BC Managed Care – PPO | Attending: General Surgery | Admitting: Physical Therapy

## 2018-10-19 ENCOUNTER — Other Ambulatory Visit: Payer: Self-pay

## 2018-10-19 DIAGNOSIS — M25612 Stiffness of left shoulder, not elsewhere classified: Secondary | ICD-10-CM | POA: Diagnosis not present

## 2018-10-19 DIAGNOSIS — Z170 Estrogen receptor positive status [ER+]: Secondary | ICD-10-CM | POA: Diagnosis not present

## 2018-10-19 DIAGNOSIS — R293 Abnormal posture: Secondary | ICD-10-CM | POA: Diagnosis not present

## 2018-10-19 DIAGNOSIS — C50412 Malignant neoplasm of upper-outer quadrant of left female breast: Secondary | ICD-10-CM | POA: Diagnosis not present

## 2018-10-19 DIAGNOSIS — I890 Lymphedema, not elsewhere classified: Secondary | ICD-10-CM | POA: Diagnosis not present

## 2018-10-19 NOTE — Therapy (Signed)
[place], [place], [address] Phone: [phone]   Fax:  [phone]  Physical Therapy Treatment  Patient Details  Name: Dorothy Frederick MRN: [id number] Date of Birth: [birth year] Referring Provider (PT): Dr. Fanny Skates   Encounter Date: 10/19/2018  PT End of Session - 10/19/18 1345    Visit Number  2    Number of Visits  10    Date for PT Re-Evaluation  11/19/18    PT Start Time  1302    PT Stop Time  1400    PT Time Calculation (min)  58 min    Activity Tolerance  Patient tolerated treatment well    Behavior During Therapy  [facility] for tasks assessed/performed       Past Medical History:  Diagnosis Date  . Angina at rest [facility])   . Anxiety   . Cancer [facility]) 2000   left breast cancer-partial mastectomy  . COPD (chronic obstructive pulmonary disease) ([place])   . Depression   . Family history of breast cancer   . Family history of esophageal cancer   . Family history of lung cancer   . Family history of prostate cancer   . GERD (gastroesophageal reflux disease)   . Hyperlipidemia   . Hypertension   . Hypothyroidism   . Recurrent breast cancer, left [facility])     Past Surgical History:  Procedure Laterality Date  . ADENOIDECTOMY    . LEFT HEART CATHETERIZATION WITH CORONARY ANGIOGRAM N/A 10/21/2013   Procedure: LEFT HEART CATHETERIZATION WITH CORONARY ANGIOGRAM;  Surgeon: Burnell Blanks, MD;  Location: [facility] CATH LAB;  Service: Cardiovascular;  Laterality: N/A;  . MASTECTOMY W/ SENTINEL NODE BIOPSY Left 09/16/2018   Procedure: LEFT TOTAL MASTECTOMY WITH LEFT AXILLARY DEEP SENTINEL LYMPH NODE BIOPSY AND INJECT BLUE DYE;  Surgeon: Fanny Skates, MD;  Location: [place];  Service: General;  Laterality: Left;  [facility] MASTECTOMY, PARTIAL Left 2000  . PORTACATH PLACEMENT Right 09/16/2018   Procedure: INSERTION PORT-A-CATH;  Surgeon: Fanny Skates, MD;  Location: [place];  Service: General;  Laterality: Right;  . TONSILLECTOMY      There were no vitals filed for this visit.  Subjective Assessment - 10/19/18 1304    Subjective  Patient underwent a left mastectomy and sentinel node biopsy (0/6 nodes positive) on 09/16/2018. She will begin chemotherapy on 10/21/2018. She is not considering reconstruction.    Pertinent History  Patient was diagnosed on 07/06/2018 with left grade III invasive ductal carcinoma breast cancer. Patient underwent a left mastectomy and sentinel node biopsy (0/6 nodes positive) on 09/16/2018. It is ER/PR negative and HER2 positive. She has a history of left breast cancer in 2000 when she had a lumpectomy, sentinel node biopsy, and radiation. She reported 1 axillary node was removed. She has had mild left arm lymphedema but reports it has resolved. She has COPD.    Patient Stated Goals  Check my arm and try to get rod of the swelling in my chest/arm.    Currently in Pain?  Yes    Pain Score  6     Pain Location  Axilla    Pain Orientation  Left    Pain Descriptors / Indicators  Burning    Pain Type  Neuropathic pain;Surgical pain    Pain Onset  More than a month ago    Pain Frequency  Intermittent    Aggravating Factors   Reaching behind    Pain Relieving Factors  Resting    Multiple Pain Sites  No         [facility] PT Assessment - 10/19/18 0001      Assessment   Medical Diagnosis  s/p left mastectomy and SLNB    Referring Provider (PT)  Dr. Fanny Skates    Onset Date/Surgical Date  09/16/18    Hand Dominance  Right    Prior Therapy  Baselines      Precautions   Precautions  Other (comment)    Precaution Comments  recent surgery      Restrictions   Weight Bearing Restrictions  No      Balance Screen   Has the patient fallen in the past 6 months  No    Has the patient had a decrease in activity level because of a fear of falling?   No    Is the patient reluctant to leave their home because of a fear of falling?   No       Home Environment   Living Environment  Private residence    Living Arrangements  Children   59 y.o. daughter and 60 & 2 y.o. grandkids   Available Help at Discharge  Family      Prior Function   Level of [place]  Full time employment    Vocation Requirements  She returns to work tomorrow 10/20/2018    Leisure  She has returned to walking 1 mile but she is very slow.      Cognition   Overall Cognitive Status  Within Functional Limits for tasks assessed      Observation/Other Assessments   Observations  Chest incision appears to be mostly healed with a small opening which is yellow in appearance. Seroma present on chest with fluid moving easily when inferior aspect of chest is palpated. Mild cording present on left anterior forearm.      Posture/Postural Control   Posture/Postural Control  Postural limitations    Postural Limitations  Rounded Shoulders;Forward head;Increased thoracic kyphosis      ROM / Strength   AROM / PROM / Strength  AROM      AROM   AROM Assessment Site  Shoulder    Right/Left Shoulder  Left    Left Shoulder Extension  45 Degrees    Left Shoulder Flexion  114 Degrees    Left Shoulder ABduction  112 Degrees    Left Shoulder Internal Rotation  65 Degrees    Left Shoulder External Rotation  80 Degrees        LYMPHEDEMA/ONCOLOGY QUESTIONNAIRE - 10/19/18 1314      Type   Cancer Type  Left breast cancer      Surgeries   Mastectomy Date  09/16/18    Sentinel Lymph Node Biopsy Date  09/16/18    Number Lymph Nodes Removed  6      Treatment   Active Chemotherapy Treatment  Yes    Date  10/21/98    Past Chemotherapy Treatment  No    Active Radiation Treatment  No    Past Radiation Treatment  Yes    Date  01/21/18    Body Site  left breast    Current Hormone Treatment  No    Past Hormone Therapy  Yes    Date  01/21/98    Drug Name  Tamoxifen      What other symptoms do you have   Are you Having Heaviness or  Tightness  Yes  Are you having Pain  Yes    Are you having pitting edema  No    Is it Hard or Difficult finding clothes that fit  No    Do you have infections  Yes    Comments  Left chest but treated with antibiotics which ended 10/14/2018    Is there Decreased scar mobility  Yes    [place] Sign  No      Lymphedema Assessments   Lymphedema Assessments  Upper extremities      Right Upper Extremity Lymphedema   10 cm Proximal to Olecranon Process  27.8 cm    Olecranon Process  23.2 cm    10 cm Proximal to Ulnar Styloid Process  20.1 cm    Just Proximal to Ulnar Styloid Process  15.2 cm    Across Hand at [employer]  18 cm    At [place] of 2nd Digit  6 cm      Left Upper Extremity Lymphedema   10 cm Proximal to Olecranon Process  27.6 cm    Olecranon Process  23.5 cm    10 cm Proximal to Ulnar Styloid Process  20.1 cm    Just Proximal to Ulnar Styloid Process  15.4 cm    Across Hand at [employer]  17.6 cm    At [place] of 2nd Digit  6.1 cm        Quick Dash - 10/19/18 0001    Open a tight or new jar  Moderate difficulty    Do heavy household chores (wash walls, wash floors)  Moderate difficulty    Carry a shopping bag or briefcase  Mild difficulty    Wash your back  No difficulty    Use a knife to cut food  No difficulty    Recreational activities in which you take some force or impact through your arm, shoulder, or hand (golf, hammering, tennis)  Mild difficulty    During the past week, to what extent has your arm, shoulder or hand problem interfered with your normal social activities with family, friends, neighbors, or groups?  Slightly    During the past week, to what extent has your arm, shoulder or hand problem limited your work or other regular daily activities  Not at all    Arm, shoulder, or hand pain.  None    Tingling (pins and needles) in your arm, shoulder, or hand  Mild    Difficulty Sleeping  No difficulty    DASH Score  18.18 %                      PT Education - 10/19/18 1344    Education Details  Education on wearing compression foam; lymphedema risk reduction and axillary cording; standing cane exercises    Person(s) Educated  Patient    Methods  Explanation;Demonstration;Handout    Comprehension  Verbalized understanding;Returned demonstration;Verbal cues required          PT Long Term Goals - 10/19/18 1429      PT LONG TERM GOAL #1   Title  Patient will demonstrate she has regained full shoulder ROM and function post operatively compared to baselines.    Time  4    Period  Weeks    Status  On-going    Target Date  11/19/18      PT LONG TERM GOAL #2   Title  Patient will increase left shoulder active flexion to >/= 130 degrees for increased  ease reaching overhead.    Baseline  114 degrees at re-eval    Time  4    Period  Weeks    Status  New    Target Date  11/19/18      PT LONG TERM GOAL #3   Title  Patient will increase left shoulder active abduction to >/= 150 degrees for increased ease reaching overhead and to return to baseline.    Baseline  112 degrees at re-eval    Time  4    Period  Weeks    Target Date  11/19/18      PT LONG TERM GOAL #4   Title  Improve DASH score to be </= 5 for improved overall upper extremity function.    Baseline  18.18    Time  4    Period  Weeks    Status  New    Target Date  11/19/18      PT LONG TERM GOAL #5   Title  Patient will verbalize good understanding of lymphedema risk reduction practices.    Time  4    Period  Weeks    Status  New            Plan - 10/19/18 1421    Clinical Impression Statement  Patient is doing fairly well s/p left mastectomy and sentinel node biopsy on 09/16/2018. She had 6 negative nodes removed. She will begin chemotherapy on 10/21/2018 as she is HER2 positive. She has a history of left breast cancer in 2000 when she underwent a lumpectomy and sentinel node biopsy and radiation. She has a small yellow  opening along her chest incision but otherwise appears to be healing well. Her shoulder ROM is limited compared to baseline and she has mild cording and left chest swelling (seroma) present. She reprots chronic lymphedema in her left arm which she reports has always been mild but measures the same as baseline today. She will benefit from PT to regain shoulder ROM, improve forearm cording, and reduce chest edema. Compression foam was placed between chest edema and bra today.    Stability/Clinical Decision Making  Stable/Uncomplicated    Rehab Potential  Excellent    PT Frequency  2x / week   Eval and 1 f/u visit   PT Duration  4 weeks    PT Treatment/Interventions  ADLs/Self Care Home Management;Therapeutic exercise;Patient/family education;Manual techniques;Manual lymph drainage;Therapeutic activities;Passive range of motion;Scar mobilization    PT Next Visit Plan  Assess chest incision to ensure small yellow open area is healing. Begin PROM and pulleys. Address chest swelling with manual lymph drainage after incision is completely healed.    PT Home Exercise Plan  Standing cane exercises and post op shoulder ROM HEP    Consulted and Agree with Plan of Care  Patient       Patient will benefit from skilled therapeutic intervention in order to improve the following deficits and impairments:  Pain, Impaired UE functional use, Decreased knowledge of precautions, Postural dysfunction, Decreased range of motion, Increased fascial restricitons, Increased edema  Visit Diagnosis: Malignant neoplasm of upper-outer quadrant of left breast in female, estrogen receptor positive ([place]) - Plan: PT plan of care cert/re-cert  Abnormal posture - Plan: PT plan of care cert/re-cert  Lymphedema, not elsewhere classified - Plan: PT plan of care cert/re-cert  Stiffness of left shoulder, not elsewhere classified - Plan: PT plan of care cert/re-cert     Problem List Patient Active Problem List   Diagnosis Date  Noted  . Pre-operative clearance 08/10/2018  . Genetic testing 07/30/2018  . Family history of breast cancer   . Family history of prostate cancer   . Family history of lung cancer   . Family history of esophageal cancer   . Malignant neoplasm of upper-outer quadrant of left breast in female, estrogen receptor negative ([place]) 07/15/2018  . CAD due to calcified coronary lesion 07/30/2014  . HTN (hypertension) 11/08/2013  . S/P cardiac cath 11/08/2013  . Abnormal nuclear stress test 10/16/2013  . Abnormal ECG 09/15/2013  . History of breast cancer, left, status post partial mastectomy and XRT 09/15/2013  . Hypercholesterolemia 09/15/2013  . History of tobacco use 09/15/2013  . Exertional dyspnea 09/15/2013   Annia Friendly, PT 10/19/18 2:39 PM  [place], [place], [address] Phone: [phone]   Fax:  [phone]  Name: Dorothy Frederick MRN: [id number] Date of Birth: 06/16/1955

## 2018-10-19 NOTE — Patient Instructions (Addendum)
Axillary web syndrome (also called cording) can happen after having breast cancer surgery when lymph nodes in the armpit are removed. It presents as if you have a thin cord in your arm and can run from the armpit all the way down into the forearm. If you've had a sentinel node biopsy, the risk is 1-20% and if you've had an axillary lymph node dissection (more than 7 nodes removed), the risk is 36-72%. The ranges vary depending on the research study.  It most often happens 3-4 weeks post-op but can happen sooner or later. There are several possibilities for what cording actually is. Although no one knows for sure as of yet, it may be related to lymphatics, veins, or other tissue. Sometimes cording resolves on its own but other times it requires physical therapy with a therapist who specializes in lymphedema and/or cancer rehab. Treatment typically involves stretching, manual techniques, and exercise. Sometimes cords get "released" while stretching or during manual treatment and the patient may experience the sensation of a "pop." This may feel strange but it is not dangerous and is a sign that the cord has released; range of motion may be improved in the process.   Cane Exercise: Extension / Internal Rotation    Stand holding cane behind back with both hands palm-up. Slide cane up spine toward head. Hold __1-2__ seconds. Repeat __10__ times. Do __2__ sessions per day.  http://gt2.exer.us/86   Copyright  VHI. All rights reserved.  Flexion (Eccentric) - Active (Cane)    Lift cane with both hands. Avoid hiking shoulders. Lower cane slowly for 3-5 seconds. __10_ reps per set, _2__ sets per day, __7_ days per week.   http://ecce.exer.us/155   Copyright  VHI. All rights reserved.  Cane Exercise: Abduction    Hold cane with right hand over end, palm-up, with other hand palm-down. Move arm out from side and up by pushing with other arm. Hold __1-2__ seconds. Repeat _10___ times. Do __2__  sessions per day.  http://gt2.exer.us/82   Copyright  VHI. All rights reserved.

## 2018-10-21 ENCOUNTER — Encounter: Payer: Self-pay | Admitting: *Deleted

## 2018-10-21 ENCOUNTER — Inpatient Hospital Stay: Payer: BC Managed Care – PPO

## 2018-10-21 ENCOUNTER — Other Ambulatory Visit: Payer: Self-pay

## 2018-10-21 VITALS — BP 138/68 | HR 68 | Temp 98.2°F | Resp 20

## 2018-10-21 DIAGNOSIS — C50412 Malignant neoplasm of upper-outer quadrant of left female breast: Secondary | ICD-10-CM

## 2018-10-21 DIAGNOSIS — Z800 Family history of malignant neoplasm of digestive organs: Secondary | ICD-10-CM | POA: Diagnosis not present

## 2018-10-21 DIAGNOSIS — Z171 Estrogen receptor negative status [ER-]: Secondary | ICD-10-CM

## 2018-10-21 DIAGNOSIS — Z79899 Other long term (current) drug therapy: Secondary | ICD-10-CM | POA: Diagnosis not present

## 2018-10-21 DIAGNOSIS — I158 Other secondary hypertension: Secondary | ICD-10-CM

## 2018-10-21 DIAGNOSIS — N184 Chronic kidney disease, stage 4 (severe): Secondary | ICD-10-CM | POA: Diagnosis not present

## 2018-10-21 DIAGNOSIS — I10 Essential (primary) hypertension: Secondary | ICD-10-CM | POA: Diagnosis not present

## 2018-10-21 DIAGNOSIS — Z87891 Personal history of nicotine dependence: Secondary | ICD-10-CM | POA: Diagnosis not present

## 2018-10-21 DIAGNOSIS — Z5112 Encounter for antineoplastic immunotherapy: Secondary | ICD-10-CM | POA: Diagnosis not present

## 2018-10-21 DIAGNOSIS — Z170 Estrogen receptor positive status [ER+]: Secondary | ICD-10-CM | POA: Diagnosis not present

## 2018-10-21 DIAGNOSIS — Z95828 Presence of other vascular implants and grafts: Secondary | ICD-10-CM

## 2018-10-21 DIAGNOSIS — F419 Anxiety disorder, unspecified: Secondary | ICD-10-CM | POA: Diagnosis not present

## 2018-10-21 DIAGNOSIS — Z853 Personal history of malignant neoplasm of breast: Secondary | ICD-10-CM | POA: Diagnosis not present

## 2018-10-21 DIAGNOSIS — J449 Chronic obstructive pulmonary disease, unspecified: Secondary | ICD-10-CM | POA: Diagnosis not present

## 2018-10-21 DIAGNOSIS — Z5111 Encounter for antineoplastic chemotherapy: Secondary | ICD-10-CM | POA: Diagnosis not present

## 2018-10-21 DIAGNOSIS — F329 Major depressive disorder, single episode, unspecified: Secondary | ICD-10-CM | POA: Diagnosis not present

## 2018-10-21 DIAGNOSIS — Z9011 Acquired absence of right breast and nipple: Secondary | ICD-10-CM | POA: Diagnosis not present

## 2018-10-21 DIAGNOSIS — Z803 Family history of malignant neoplasm of breast: Secondary | ICD-10-CM | POA: Diagnosis not present

## 2018-10-21 LAB — COMPREHENSIVE METABOLIC PANEL
ALT: 17 U/L (ref 0–44)
AST: 16 U/L (ref 15–41)
Albumin: 3.7 g/dL (ref 3.5–5.0)
Alkaline Phosphatase: 107 U/L (ref 38–126)
Anion gap: 12 (ref 5–15)
BUN: 13 mg/dL (ref 8–23)
CO2: 21 mmol/L — ABNORMAL LOW (ref 22–32)
Calcium: 9.2 mg/dL (ref 8.9–10.3)
Chloride: 108 mmol/L (ref 98–111)
Creatinine, Ser: 1.09 mg/dL — ABNORMAL HIGH (ref 0.44–1.00)
GFR calc Af Amer: >60 mL/min (ref >60)
GFR calc non Af Amer: 54 mL/min — ABNORMAL LOW (ref >60)
Glucose, Bld: 117 mg/dL — ABNORMAL HIGH (ref 70–99)
Potassium: 3.7 mmol/L (ref 3.5–5.1)
Sodium: 141 mmol/L (ref 135–145)
Total Bilirubin: 0.6 mg/dL (ref 0.3–1.2)
Total Protein: 7 g/dL (ref 6.5–8.1)

## 2018-10-21 LAB — CBC WITH DIFFERENTIAL/PLATELET
Abs Immature Granulocytes: 0.01 10*3/uL (ref 0.00–0.07)
Basophils Absolute: 0 10*3/uL (ref 0.0–0.1)
Basophils Relative: 1 %
Eosinophils Absolute: 0.2 10*3/uL (ref 0.0–0.5)
Eosinophils Relative: 3 %
HCT: 36.2 % (ref 36.0–46.0)
Hemoglobin: 11.7 g/dL — ABNORMAL LOW (ref 12.0–15.0)
Immature Granulocytes: 0 %
Lymphocytes Relative: 20 %
Lymphs Abs: 1.1 10*3/uL (ref 0.7–4.0)
MCH: 28.2 pg (ref 26.0–34.0)
MCHC: 32.3 g/dL (ref 30.0–36.0)
MCV: 87.2 fL (ref 80.0–100.0)
Monocytes Absolute: 0.5 10*3/uL (ref 0.1–1.0)
Monocytes Relative: 10 %
Neutro Abs: 3.7 10*3/uL (ref 1.7–7.7)
Neutrophils Relative %: 66 %
Platelets: 295 10*3/uL (ref 150–400)
RBC: 4.15 MIL/uL (ref 3.87–5.11)
RDW: 13 % (ref 11.5–15.5)
WBC: 5.4 10*3/uL (ref 4.0–10.5)
nRBC: 0 % (ref 0.0–0.2)

## 2018-10-21 MED ORDER — HEPARIN SOD (PORK) LOCK FLUSH 100 UNIT/ML IV SOLN
500.0000 [IU] | Freq: Once | INTRAVENOUS | Status: AC | PRN
  Administered 2018-10-21: 14:00:00 500 [IU]
  Filled 2018-10-21: qty 5

## 2018-10-21 MED ORDER — SODIUM CHLORIDE 0.9 % IV SOLN
80.0000 mg/m2 | Freq: Once | INTRAVENOUS | Status: AC
  Administered 2018-10-21: 150 mg via INTRAVENOUS
  Filled 2018-10-21: qty 25

## 2018-10-21 MED ORDER — ACETAMINOPHEN 325 MG PO TABS
650.0000 mg | ORAL_TABLET | Freq: Once | ORAL | Status: AC
  Administered 2018-10-21: 09:00:00 650 mg via ORAL

## 2018-10-21 MED ORDER — SODIUM CHLORIDE 0.9 % IV SOLN
Freq: Once | INTRAVENOUS | Status: AC
  Administered 2018-10-21: 09:00:00 via INTRAVENOUS
  Filled 2018-10-21: qty 250

## 2018-10-21 MED ORDER — ACETAMINOPHEN 325 MG PO TABS
ORAL_TABLET | ORAL | Status: AC
  Filled 2018-10-21: qty 2

## 2018-10-21 MED ORDER — SODIUM CHLORIDE 0.9% FLUSH
10.0000 mL | INTRAVENOUS | Status: DC | PRN
  Administered 2018-10-21: 10 mL via INTRAVENOUS
  Filled 2018-10-21: qty 10

## 2018-10-21 MED ORDER — FAMOTIDINE IN NACL 20-0.9 MG/50ML-% IV SOLN
INTRAVENOUS | Status: AC
  Filled 2018-10-21: qty 50

## 2018-10-21 MED ORDER — SODIUM CHLORIDE 0.9% FLUSH
10.0000 mL | INTRAVENOUS | Status: DC | PRN
  Administered 2018-10-21: 14:00:00 10 mL
  Filled 2018-10-21: qty 10

## 2018-10-21 MED ORDER — DIPHENHYDRAMINE HCL 50 MG/ML IJ SOLN
25.0000 mg | Freq: Once | INTRAMUSCULAR | Status: AC
  Administered 2018-10-21: 25 mg via INTRAVENOUS

## 2018-10-21 MED ORDER — DIPHENHYDRAMINE HCL 50 MG/ML IJ SOLN
INTRAMUSCULAR | Status: AC
  Filled 2018-10-21: qty 1

## 2018-10-21 MED ORDER — FAMOTIDINE IN NACL 20-0.9 MG/50ML-% IV SOLN
20.0000 mg | Freq: Once | INTRAVENOUS | Status: AC
  Administered 2018-10-21: 20 mg via INTRAVENOUS

## 2018-10-21 MED ORDER — SODIUM CHLORIDE 0.9 % IV SOLN
20.0000 mg | Freq: Once | INTRAVENOUS | Status: AC
  Administered 2018-10-21: 20 mg via INTRAVENOUS
  Filled 2018-10-21: qty 20

## 2018-10-21 MED ORDER — TRASTUZUMAB-DKST CHEMO 150 MG IV SOLR
300.0000 mg | Freq: Once | INTRAVENOUS | Status: AC
  Administered 2018-10-21: 10:00:00 300 mg via INTRAVENOUS
  Filled 2018-10-21: qty 14.29

## 2018-10-21 NOTE — Progress Notes (Signed)
Patient provided further information on her listed benadryl allergy - it was over 20 years ago and she felt that her heart raced/had palpitations. It was not severe enough to seek further medical attention. She is comfortable receiving medication today. MD decreased dose to 25mg .   Could consider changing to an oral loratidine if she has issues again today w/ benadryl and does not have any infusion related reactions w/ Ogivri or paclitaxel.   Demetrius Charity, PharmD, [place] [facility] Phone: [phone] 10/21/2018

## 2018-10-21 NOTE — Patient Instructions (Signed)
[facility] Discharge Instructions for Patients Receiving Chemotherapy  Today you received the following chemotherapy agents Taxol, Herceptin.  To help prevent nausea and vomiting after your treatment, we encourage you to take your nausea medication.   If you develop nausea and vomiting that is not controlled by your nausea medication, call the clinic.   BELOW ARE SYMPTOMS THAT SHOULD BE REPORTED IMMEDIATELY:  *FEVER GREATER THAN 100.5 F  *CHILLS WITH OR WITHOUT FEVER  NAUSEA AND VOMITING THAT IS NOT CONTROLLED WITH YOUR NAUSEA MEDICATION  *UNUSUAL SHORTNESS OF BREATH  *UNUSUAL BRUISING OR BLEEDING  TENDERNESS IN MOUTH AND THROAT WITH OR WITHOUT PRESENCE OF ULCERS  *URINARY PROBLEMS  *BOWEL PROBLEMS  UNUSUAL RASH Items with * indicate a potential emergency and should be followed up as soon as possible.  Feel free to call the clinic should you have any questions or concerns. The clinic phone number is [phone].  Please show the [facility] at check-in to the Emergency Department and triage nurse.  Trastuzumab injection for infusion What is this medicine? TRASTUZUMAB (tras TOO zoo mab) is a monoclonal antibody. It is used to treat breast cancer and stomach cancer. This medicine may be used for other purposes; ask your health care provider or pharmacist if you have questions. COMMON BRAND NAME(S): Herceptin, Galvin Proffer, Trazimera What should I tell my health care provider before I take this medicine? They need to know if you have any of these conditions:  heart disease  heart failure  lung or breathing disease, like asthma  an unusual or allergic reaction to trastuzumab, benzyl alcohol, or other medications, foods, dyes, or preservatives  pregnant or trying to get pregnant  breast-feeding How should I use this medicine? This drug is given as an infusion into a vein. It is administered in a hospital or clinic by a  specially trained health care professional. Talk to your pediatrician regarding the use of this medicine in children. This medicine is not approved for use in children. Overdosage: If you think you have taken too much of this medicine contact a poison control center or emergency room at once. NOTE: This medicine is only for you. Do not share this medicine with others. What if I miss a dose? It is important not to miss a dose. Call your doctor or health care professional if you are unable to keep an appointment. What may interact with this medicine? This medicine may interact with the following medications:  certain types of chemotherapy, such as daunorubicin, doxorubicin, epirubicin, and idarubicin This list may not describe all possible interactions. Give your health care provider a list of all the medicines, herbs, non-prescription drugs, or dietary supplements you use. Also tell them if you smoke, drink alcohol, or use illegal drugs. Some items may interact with your medicine. What should I watch for while using this medicine? Visit your doctor for checks on your progress. Report any side effects. Continue your course of treatment even though you feel ill unless your doctor tells you to stop. Call your doctor or health care professional for advice if you get a fever, chills or sore throat, or other symptoms of a cold or flu. Do not treat yourself. Try to avoid being around people who are sick. You may experience fever, chills and shaking during your first infusion. These effects are usually mild and can be treated with other medicines. Report any side effects during the infusion to your health care professional. Fever and chills usually  do not happen with later infusions. Do not become pregnant while taking this medicine or for 7 months after stopping it. Women should inform their doctor if they wish to become pregnant or think they might be pregnant. Women of child-bearing potential will need to  have a negative pregnancy test before starting this medicine. There is a potential for serious side effects to an unborn child. Talk to your health care professional or pharmacist for more information. Do not breast-feed an infant while taking this medicine or for 7 months after stopping it. Women must use effective birth control with this medicine. What side effects may I notice from receiving this medicine? Side effects that you should report to your doctor or health care professional as soon as possible:  allergic reactions like skin rash, itching or hives, swelling of the face, lips, or tongue  chest pain or palpitations  cough  dizziness  feeling faint or lightheaded, falls  fever  general ill feeling or flu-like symptoms  signs of worsening heart failure like breathing problems; swelling in your legs and feet  unusually weak or tired Side effects that usually do not require medical attention (report to your doctor or health care professional if they continue or are bothersome):  bone pain  changes in taste  diarrhea  joint pain  nausea/vomiting  weight loss This list may not describe all possible side effects. Call your doctor for medical advice about side effects. You may report side effects to FDA at 1-800-FDA-1088. Where should I keep my medicine? This drug is given in a hospital or clinic and will not be stored at home. NOTE: This sheet is a summary. It may not cover all possible information. If you have questions about this medicine, talk to your doctor, pharmacist, or health care provider.  2020 Elsevier/Gold Standard (2016-01-02 14:37:52) Paclitaxel injection What is this medicine? PACLITAXEL (PAK li TAX el) is a chemotherapy drug. It targets fast dividing cells, like cancer cells, and causes these cells to die. This medicine is used to treat ovarian cancer, breast cancer, lung cancer, Kaposi's sarcoma, and other cancers. This medicine may be used for other  purposes; ask your health care provider or pharmacist if you have questions. COMMON BRAND NAME(S): Onxol, Taxol What should I tell my health care provider before I take this medicine? They need to know if you have any of these conditions:  history of irregular heartbeat  liver disease  low blood counts, like low white cell, platelet, or red cell counts  lung or breathing disease, like asthma  tingling of the fingers or toes, or other nerve disorder  an unusual or allergic reaction to paclitaxel, alcohol, polyoxyethylated castor oil, other chemotherapy, other medicines, foods, dyes, or preservatives  pregnant or trying to get pregnant  breast-feeding How should I use this medicine? This drug is given as an infusion into a vein. It is administered in a hospital or clinic by a specially trained health care professional. Talk to your pediatrician regarding the use of this medicine in children. Special care may be needed. Overdosage: If you think you have taken too much of this medicine contact a poison control center or emergency room at once. NOTE: This medicine is only for you. Do not share this medicine with others. What if I miss a dose? It is important not to miss your dose. Call your doctor or health care professional if you are unable to keep an appointment. What may interact with this medicine? Do not take this  medicine with any of the following medications:  disulfiram  metronidazole This medicine may also interact with the following medications:  antiviral medicines for hepatitis, HIV or AIDS  certain antibiotics like erythromycin and clarithromycin  certain medicines for fungal infections like ketoconazole and itraconazole  certain medicines for seizures like carbamazepine, phenobarbital, phenytoin  gemfibrozil  nefazodone  rifampin  St. John's wort This list may not describe all possible interactions. Give your health care provider a list of all the  medicines, herbs, non-prescription drugs, or dietary supplements you use. Also tell them if you smoke, drink alcohol, or use illegal drugs. Some items may interact with your medicine. What should I watch for while using this medicine? Your condition will be monitored carefully while you are receiving this medicine. You will need important blood work done while you are taking this medicine. This medicine can cause serious allergic reactions. To reduce your risk you will need to take other medicine(s) before treatment with this medicine. If you experience allergic reactions like skin rash, itching or hives, swelling of the face, lips, or tongue, tell your doctor or health care professional right away. In some cases, you may be given additional medicines to help with side effects. Follow all directions for their use. This drug may make you feel generally unwell. This is not uncommon, as chemotherapy can affect healthy cells as well as cancer cells. Report any side effects. Continue your course of treatment even though you feel ill unless your doctor tells you to stop. Call your doctor or health care professional for advice if you get a fever, chills or sore throat, or other symptoms of a cold or flu. Do not treat yourself. This drug decreases your body's ability to fight infections. Try to avoid being around people who are sick. This medicine may increase your risk to bruise or bleed. Call your doctor or health care professional if you notice any unusual bleeding. Be careful brushing and flossing your teeth or using a toothpick because you may get an infection or bleed more easily. If you have any dental work done, tell your dentist you are receiving this medicine. Avoid taking products that contain aspirin, acetaminophen, ibuprofen, naproxen, or ketoprofen unless instructed by your doctor. These medicines may hide a fever. Do not become pregnant while taking this medicine. Women should inform their doctor if  they wish to become pregnant or think they might be pregnant. There is a potential for serious side effects to an unborn child. Talk to your health care professional or pharmacist for more information. Do not breast-feed an infant while taking this medicine. Men are advised not to father a child while receiving this medicine. This product may contain alcohol. Ask your pharmacist or healthcare provider if this medicine contains alcohol. Be sure to tell all healthcare providers you are taking this medicine. Certain medicines, like metronidazole and disulfiram, can cause an unpleasant reaction when taken with alcohol. The reaction includes flushing, headache, nausea, vomiting, sweating, and increased thirst. The reaction can last from 30 minutes to several hours. What side effects may I notice from receiving this medicine? Side effects that you should report to your doctor or health care professional as soon as possible:  allergic reactions like skin rash, itching or hives, swelling of the face, lips, or tongue  breathing problems  changes in vision  fast, irregular heartbeat  high or low blood pressure  mouth sores  pain, tingling, numbness in the hands or feet  signs of decreased platelets or  bleeding - bruising, pinpoint red spots on the skin, black, tarry stools, blood in the urine  signs of decreased red blood cells - unusually weak or tired, feeling faint or lightheaded, falls  signs of infection - fever or chills, cough, sore throat, pain or difficulty passing urine  signs and symptoms of liver injury like dark yellow or brown urine; general ill feeling or flu-like symptoms; light-colored stools; loss of appetite; nausea; right upper belly pain; unusually weak or tired; yellowing of the eyes or skin  swelling of the ankles, feet, hands  unusually slow heartbeat Side effects that usually do not require medical attention (report to your doctor or health care professional if they  continue or are bothersome):  diarrhea  hair loss  loss of appetite  muscle or joint pain  nausea, vomiting  pain, redness, or irritation at site where injected  tiredness This list may not describe all possible side effects. Call your doctor for medical advice about side effects. You may report side effects to FDA at 1-800-FDA-1088. Where should I keep my medicine? This drug is given in a hospital or clinic and will not be stored at home. NOTE: This sheet is a summary. It may not cover all possible information. If you have questions about this medicine, talk to your doctor, pharmacist, or health care provider.  2020 Elsevier/Gold Standard (2016-09-10 13:14:55)

## 2018-10-21 NOTE — Progress Notes (Signed)
Pt. Completed first day Taxol without any complaints or complications. Additionally, she did well with Benadryl 25mg  premed.

## 2018-10-22 ENCOUNTER — Telehealth: Payer: Self-pay | Admitting: *Deleted

## 2018-10-23 ENCOUNTER — Other Ambulatory Visit: Payer: Self-pay

## 2018-10-23 ENCOUNTER — Ambulatory Visit: Payer: BC Managed Care – PPO | Attending: General Surgery | Admitting: Rehabilitation

## 2018-10-23 ENCOUNTER — Encounter: Payer: Self-pay | Admitting: Rehabilitation

## 2018-10-23 DIAGNOSIS — M25612 Stiffness of left shoulder, not elsewhere classified: Secondary | ICD-10-CM | POA: Insufficient documentation

## 2018-10-23 DIAGNOSIS — Z170 Estrogen receptor positive status [ER+]: Secondary | ICD-10-CM | POA: Insufficient documentation

## 2018-10-23 DIAGNOSIS — C50412 Malignant neoplasm of upper-outer quadrant of left female breast: Secondary | ICD-10-CM | POA: Diagnosis not present

## 2018-10-23 DIAGNOSIS — R293 Abnormal posture: Secondary | ICD-10-CM | POA: Diagnosis not present

## 2018-10-23 DIAGNOSIS — I890 Lymphedema, not elsewhere classified: Secondary | ICD-10-CM | POA: Diagnosis not present

## 2018-10-23 NOTE — Therapy (Signed)
[place], [place], [address] Phone: [phone]   Fax:  [phone]  Physical Therapy Treatment  Patient Details  Name: Dorothy Frederick MRN: [id number] Date of Birth: [birth year] Referring Provider (PT): Dr. Fanny Skates   Encounter Date: 10/23/2018  PT End of Session - 10/23/18 0845    Visit Number  3    Number of Visits  10    Date for PT Re-Evaluation  11/19/18    PT Start Time  0802    PT Stop Time  0846    PT Time Calculation (min)  44 min    Activity Tolerance  Patient tolerated treatment well    Behavior During Therapy  [facility] for tasks assessed/performed       Past Medical History:  Diagnosis Date  . Angina at rest [facility])   . Anxiety   . Cancer [facility]) 2000   left breast cancer-partial mastectomy  . COPD (chronic obstructive pulmonary disease) ([place])   . Depression   . Family history of breast cancer   . Family history of esophageal cancer   . Family history of lung cancer   . Family history of prostate cancer   . GERD (gastroesophageal reflux disease)   . Hyperlipidemia   . Hypertension   . Hypothyroidism   . Recurrent breast cancer, left [facility])     Past Surgical History:  Procedure Laterality Date  . ADENOIDECTOMY    . LEFT HEART CATHETERIZATION WITH CORONARY ANGIOGRAM N/A 10/21/2013   Procedure: LEFT HEART CATHETERIZATION WITH CORONARY ANGIOGRAM;  Surgeon: Burnell Blanks, MD;  Location: [facility] CATH LAB;  Service: Cardiovascular;  Laterality: N/A;  . MASTECTOMY W/ SENTINEL NODE BIOPSY Left 09/16/2018   Procedure: LEFT TOTAL MASTECTOMY WITH LEFT AXILLARY DEEP SENTINEL LYMPH NODE BIOPSY AND INJECT BLUE DYE;  Surgeon: Fanny Skates, MD;  Location: [facility];  Service: General;  Laterality: Left;  [facility] MASTECTOMY, PARTIAL Left 2000  . PORTACATH PLACEMENT Right 09/16/2018   Procedure: INSERTION PORT-A-CATH;  Surgeon: Fanny Skates, MD;  Location: [facility];  Service: General;  Laterality: Right;  . TONSILLECTOMY      There were no vitals filed for this visit.  Subjective Assessment - 10/23/18 0802    Subjective  I had my first infusion 2 days ago.  1x/week for 12 weeks just feeling nauseous and fatigued.  The foam is working well.  My sports bras are working well.    Pertinent History  Patient was diagnosed on 07/06/2018 with left grade III invasive ductal carcinoma breast cancer. Patient underwent a left mastectomy and sentinel node biopsy (0/6 nodes positive) on 09/16/2018. It is ER/PR negative and HER2 positive. She has a history of left breast cancer in 2000 when she had a lumpectomy, sentinel node biopsy, and radiation. She reported 1 axillary node was removed. She has had mild left arm lymphedema but reports it has resolved. She has COPD.    Patient Stated Goals  Check my arm and try to get rod of the swelling in my chest/arm.    Currently in Pain?  No/denies         [facility] PT Assessment - 10/23/18 0001      Observation/Other Assessments   Observations  seroma is still present and open spot still healing well                    [facility] Adult PT Treatment/Exercise - 10/23/18 0001      Exercises  Exercises  Shoulder      Shoulder Exercises: Supine   Flexion  10 reps    Flexion Limitations  dowel flexion      Shoulder Exercises: Pulleys   Flexion  2 minutes    Flexion Limitations  with instruction and cueing for performance    Scaption  2 minutes    Scaption Limitations  with instruction and cueing for performance      Shoulder Exercises: Therapy Ball   Flexion  Both;10 reps      Manual Therapy   Manual Therapy  Passive ROM    Passive ROM  to the left shoulder to tolerance with many vcs required to relax.  Upon talking about difficulty relaxing pt reports "I had a bad childhood so I have trouble relaxing".  Told pt to make sure to let us know if she feels uncomfortable with anything we are doing and she is  agreeable.                    PT Long Term Goals - 10/19/18 1429      PT LONG TERM GOAL #1   Title  Patient will demonstrate she has regained full shoulder ROM and function post operatively compared to baselines.    Time  4    Period  Weeks    Status  On-going    Target Date  11/19/18      PT LONG TERM GOAL #2   Title  Patient will increase left shoulder active flexion to >/= 130 degrees for increased ease reaching overhead.    Baseline  114 degrees at re-eval    Time  4    Period  Weeks    Status  New    Target Date  11/19/18      PT LONG TERM GOAL #3   Title  Patient will increase left shoulder active abduction to >/= 150 degrees for increased ease reaching overhead and to return to baseline.    Baseline  112 degrees at re-eval    Time  4    Period  Weeks    Target Date  11/19/18      PT LONG TERM GOAL #4   Title  Improve DASH score to be </= 5 for improved overall upper extremity function.    Baseline  18.18    Time  4    Period  Weeks    Status  New    Target Date  11/19/18      PT LONG TERM GOAL #5   Title  Patient will verbalize good understanding of lymphedema risk reduction practices.    Time  4    Period  Weeks    Status  New            Plan - 10/23/18 0846    Clinical Impression Statement  No change in incision or sermona status since evaluation but yellow spot along the incision was monitored with PROM no excessive pulling was present.  Significant chest seroma which Dr. Dalbert Batman will see next week most likely needing aspiration.  Began PROM and AAROM in clinic today.    PT Next Visit Plan  Assess chest incision to ensure small yellow open area is healing throughout sessions. cont PROM and pulleys. Address chest swelling with manual lymph drainage after incision is completely healed    Consulted and Agree with Plan of Care  Patient       Patient will benefit from skilled therapeutic intervention in order  to improve the following deficits and  impairments:     Visit Diagnosis: Malignant neoplasm of upper-outer quadrant of left breast in female, estrogen receptor positive (HCC)  Abnormal posture  Lymphedema, not elsewhere classified  Stiffness of left shoulder, not elsewhere classified     Problem List Patient Active Problem List   Diagnosis Date Noted  . Pre-operative clearance 08/10/2018  . Genetic testing 07/30/2018  . Family history of breast cancer   . Family history of prostate cancer   . Family history of lung cancer   . Family history of esophageal cancer   . Malignant neoplasm of upper-outer quadrant of left breast in female, estrogen receptor negative ([place]) 07/15/2018  . CAD due to calcified coronary lesion 07/30/2014  . HTN (hypertension) 11/08/2013  . S/P cardiac cath 11/08/2013  . Abnormal nuclear stress test 10/16/2013  . Abnormal ECG 09/15/2013  . History of breast cancer, left, status post partial mastectomy and XRT 09/15/2013  . Hypercholesterolemia 09/15/2013  . History of tobacco use 09/15/2013  . Exertional dyspnea 09/15/2013    Stark Bray 10/23/2018, 8:52 AM  [place], [place], [address] Phone: [phone]   Fax:  [phone]  Name: Dorothy Frederick MRN: [id number] Date of Birth: [birth year]

## 2018-10-26 ENCOUNTER — Other Ambulatory Visit: Payer: Self-pay

## 2018-10-26 ENCOUNTER — Ambulatory Visit: Payer: BC Managed Care – PPO | Admitting: Physical Therapy

## 2018-10-26 ENCOUNTER — Encounter: Payer: Self-pay | Admitting: Physical Therapy

## 2018-10-26 DIAGNOSIS — M25612 Stiffness of left shoulder, not elsewhere classified: Secondary | ICD-10-CM

## 2018-10-26 DIAGNOSIS — I890 Lymphedema, not elsewhere classified: Secondary | ICD-10-CM

## 2018-10-26 DIAGNOSIS — C50412 Malignant neoplasm of upper-outer quadrant of left female breast: Secondary | ICD-10-CM

## 2018-10-26 DIAGNOSIS — R293 Abnormal posture: Secondary | ICD-10-CM

## 2018-10-26 DIAGNOSIS — Z170 Estrogen receptor positive status [ER+]: Secondary | ICD-10-CM

## 2018-10-26 NOTE — Therapy (Signed)
[place], [place], [address] Phone: [phone]   Fax:  [phone]  Physical Therapy Treatment  Patient Details  Name: Dorothy Frederick MRN: [id number] Date of Birth: [birth year] Referring Provider (PT): Dr. Fanny Skates   Encounter Date: 10/26/2018  PT End of Session - 10/26/18 0851    Visit Number  4    Number of Visits  10    Date for PT Re-Evaluation  11/19/18    PT Start Time  0803    PT Stop Time  0845    PT Time Calculation (min)  42 min    Activity Tolerance  Patient tolerated treatment well    Behavior During Therapy  [facility] for tasks assessed/performed       Past Medical History:  Diagnosis Date  . Angina at rest [facility])   . Anxiety   . Cancer [facility]) 2000   left breast cancer-partial mastectomy  . COPD (chronic obstructive pulmonary disease) ([place])   . Depression   . Family history of breast cancer   . Family history of esophageal cancer   . Family history of lung cancer   . Family history of prostate cancer   . GERD (gastroesophageal reflux disease)   . Hyperlipidemia   . Hypertension   . Hypothyroidism   . Recurrent breast cancer, left [facility])     Past Surgical History:  Procedure Laterality Date  . ADENOIDECTOMY    . LEFT HEART CATHETERIZATION WITH CORONARY ANGIOGRAM N/A 10/21/2013   Procedure: LEFT HEART CATHETERIZATION WITH CORONARY ANGIOGRAM;  Surgeon: Burnell Blanks, MD;  Location: [facility] CATH LAB;  Service: Cardiovascular;  Laterality: N/A;  . MASTECTOMY W/ SENTINEL NODE BIOPSY Left 09/16/2018   Procedure: LEFT TOTAL MASTECTOMY WITH LEFT AXILLARY DEEP SENTINEL LYMPH NODE BIOPSY AND INJECT BLUE DYE;  Surgeon: Fanny Skates, MD;  Location: [place];  Service: General;  Laterality: Left;  [facility] MASTECTOMY, PARTIAL Left 2000  . PORTACATH PLACEMENT Right 09/16/2018   Procedure: INSERTION PORT-A-CATH;  Surgeon: Fanny Skates, MD;  Location: [place];  Service: General;  Laterality: Right;  . TONSILLECTOMY      There were no vitals filed for this visit.  Subjective Assessment - 10/26/18 0846    Subjective  Pt states she did alot with her arm over the weekend and she feels like her seroma is worse . She goes to see the PA tomorrow to hopefully  have it drained    Pertinent History  Patient was diagnosed on 07/06/2018 with left grade III invasive ductal carcinoma breast cancer. Patient underwent a left mastectomy and sentinel node biopsy (0/6 nodes positive) on 09/16/2018. It is ER/PR negative and HER2 positive. She has a history of left breast cancer in 2000 when she had a lumpectomy, sentinel node biopsy, and radiation. She reported 1 axillary node was removed. She has had mild left arm lymphedema but reports it has resolved. She has COPD.    Currently in Pain?  No/denies         [facility] PT Assessment - 10/26/18 0001      AROM   Left Shoulder Flexion  130 Degrees    Left Shoulder ABduction  150 Degrees                   [facility] Adult PT Treatment/Exercise - 10/26/18 0001      Shoulder Exercises: Supine   Diagonals  AROM;Left;10 reps    Diagonals Limitations   with manual "holding"  of tissue on left chest       Shoulder Exercises: Sidelying   Other Sidelying Exercises  hand pointed to ceiling and small circles in each direction       Manual Therapy   Manual Therapy  Edema management;Manual Lymphatic Drainage (MLD)    Edema Management  Pt has visible "sloshing' of tissue in left upper chest and around incision.  She was instructed to take her compression binder and foam patch to Dr. appt tomorrow and consider applying compression to area after aspiration     Manual Lymphatic Drainage (MLD)  short neck on left side superficial and deep abdoiminals with instruction in diaphragmatic breasthing. right axillay nodes. left inguinal nodes and left axillo-inguinal anasatamosis and left chest then to sidlying for posterior  interaxillary anastamosis and back.                   PT Long Term Goals - 10/26/18 7902      PT LONG TERM GOAL #1   Title  Patient will demonstrate she has regained full shoulder ROM and function post operatively compared to baselines.    Time  4    Period  Weeks    Status  On-going      PT LONG TERM GOAL #2   Title  Patient will increase left shoulder active flexion to >/= 130 degrees for increased ease reaching overhead.    Baseline  114 degrees at re-eval, 130 on 10/26/2018    Status  Achieved      PT LONG TERM GOAL #3   Title  Patient will increase left shoulder active abduction to >/= 150 degrees for increased ease reaching overhead and to return to baseline.    Baseline  112 degrees at re-eval, 150 on 10/26/2018    Status  Achieved      PT LONG TERM GOAL #4   Title  Improve DASH score to be </= 5 for improved overall upper extremity function.    Baseline  18.18    Status  On-going      PT LONG TERM GOAL #5   Title  Patient will verbalize good understanding of lymphedema risk reduction practices.    Time  4    Period  Weeks    Status  On-going            Plan - 10/26/18 4097    Clinical Impression Statement  Pt continues with very fluid seroma in left chest.  Incision is dry with yellow spot improving.. Pt has improved in shoulder ROM  with those goals met. She needs posture work and general strengthening once seroma is resoved.    PT Treatment/Interventions  ADLs/Self Care Home Management;Therapeutic exercise;Patient/family education;Manual techniques;Manual lymph drainage;Therapeutic activities;Passive range of motion;Scar mobilization    PT Next Visit Plan  Assess chest incision to ensure small yellow open area is healing throughout sessions. cont PROM and pulleysas indicated. Progress with posture and strengthening . Address chest swelling with manual lymph drainage after incision is completely healed    PT Home Exercise Plan  Standing cane exercises and  post op shoulder ROM HEP    Consulted and Agree with Plan of Care  Patient       Patient will benefit from skilled therapeutic intervention in order to improve the following deficits and impairments:  Pain, Impaired UE functional use, Decreased knowledge of precautions, Postural dysfunction, Decreased range of motion, Increased fascial restricitons, Increased edema  Visit Diagnosis: Malignant neoplasm of upper-outer quadrant of left  breast in female, estrogen receptor positive ([place])  Abnormal posture  Lymphedema, not elsewhere classified  Stiffness of left shoulder, not elsewhere classified     Problem List Patient Active Problem List   Diagnosis Date Noted  . Pre-operative clearance 08/10/2018  . Genetic testing 07/30/2018  . Family history of breast cancer   . Family history of prostate cancer   . Family history of lung cancer   . Family history of esophageal cancer   . Malignant neoplasm of upper-outer quadrant of left breast in female, estrogen receptor negative ([place]) 07/15/2018  . CAD due to calcified coronary lesion 07/30/2014  . HTN (hypertension) 11/08/2013  . S/P cardiac cath 11/08/2013  . Abnormal nuclear stress test 10/16/2013  . Abnormal ECG 09/15/2013  . History of breast cancer, left, status post partial mastectomy and XRT 09/15/2013  . Hypercholesterolemia 09/15/2013  . History of tobacco use 09/15/2013  . Exertional dyspnea 09/15/2013   Donato Heinz. Owens Shark PT  Norwood Levo 10/26/2018, 8:55 AM  [place], [place], [address] Phone: [phone]   Fax:  [phone]  Name: Kambra Beachem MRN: [id number] Date of Birth: 28-Mar-1955

## 2018-10-28 NOTE — Progress Notes (Signed)
Dorothy Frederick  Telephone:(336) [phone] Fax:(336) [phone]    ID: Dorothy Frederick DOB: 04-24-1955  MR#: [id number]  PZW#:[id number]  Patient Care Team: Dorothy Bien, MD as PCP - General (Family Medicine) Dorothy Latch, MD as PCP - Cardiology (Cardiology) Dorothy Kaufmann, RN as Oncology Nurse Navigator Dorothy Germany, RN as Oncology Nurse Navigator Dorothy Skates, MD as Consulting Physician (General Surgery) Dorothy Frederick, Dorothy Dad, MD as Consulting Physician (Oncology) Dorothy Pray, MD as Consulting Physician (Radiation Oncology) Dorothy Frederick, Dorothy Gobble, MD as Consulting Physician (Cardiology) OTHER MD:   CHIEF COMPLAINT: Estrogen receptor negative breast cancer  CURRENT TREATMENT: Neoadjuvant chemotherapy and anti-HER-2 immunotherapy   INTERVAL HISTORY: Dorothy Frederick returns today for follow-up and treatment of her estrogen receptor negative breast cancer.   She began adjuvant chemotherapy consisting of paclitaxel and trastuzumab on 10/21/2018. This is to be given weekly x12.  Today is day 8 cycle 1 = dose #2.  Since her last visit here she underwent a drainage procedure at Dr. Darrel Frederick office.  She says little over 100 cc of fluid were drawn from her left chest wall.  She had no complications from that.   REVIEW OF SYSTEMS: Dorothy Frederick did generally quite well with her first cycle of chemo.  She had some cramps in her lower legs and a little bit of pain in her knees.  She was constipated for 2 days.  She had no nausea or vomiting, no mouth sores.  She did not have any unusual headaches, visual changes, cough, phlegm production, or pleurisy.  She felt a little bit tired.  She still has not begun to lose her hair.  She has had no neuropathy.  A detailed review of systems today was stable.   HISTORY OF CURRENT ILLNESS: From the original intake note:  Dorothy Frederick has a history of left breast cancer diagnosed in 2000, and treated with a partial mastectomy.  She has been followed by Dr.  Matthew Frederick in [facility]. She was also treated with Tamoxifen for 1.5 years, which she tolerated well, but had to discontinue due to financial difficulties. She did not receive chemotherapy. She notes that her cancer was lymph node negative.  More recently, she had routine screening mammography on 07/06/2018 showing: Breast Density Category B. On mammography, there is a 1.5 cm oval mass with associated calcifications in the left breast upper outer quadrant posterior depth 6.6 cm from the nipple. This mass is approximately 3 cm anterior to the lumpectomy site in the upper outer posterior left breast. No other significant masses, calcifications, or other findings are seen in either breast.   Accordingly on 07/13/2018 she proceeded to biopsy of the left breast area in question. The pathology from this procedure showed ([id number]): invasive ductal carcinoma, grade III, 2 o'clock, 5 cm from the nipple. Prognostic indicators significant for: estrogen receptor, 0% negative and progesterone receptor, 0% negative. Proliferation marker Ki67 at 70%. HER2 positive (3+) by immunohistochemistry.  The patient's subsequent history is as detailed below.   PAST MEDICAL HISTORY: Past Medical History:  Diagnosis Date   Angina at rest [facility])    Anxiety    Cancer ([place]) 2000   left breast cancer-partial mastectomy   COPD (chronic obstructive pulmonary disease) ([place])    Depression    Family history of breast cancer    Family history of esophageal cancer    Family history of lung cancer    Family history of prostate cancer    GERD (gastroesophageal reflux disease)    Hyperlipidemia  Hypertension    Hypothyroidism    Recurrent breast cancer, left ([place])      PAST SURGICAL HISTORY: Past Surgical History:  Procedure Laterality Date   ADENOIDECTOMY     LEFT HEART CATHETERIZATION WITH CORONARY ANGIOGRAM N/A 10/21/2013   Procedure: LEFT HEART CATHETERIZATION WITH CORONARY ANGIOGRAM;  Surgeon:  Burnell Blanks, MD;  Location: [facility] CATH LAB;  Service: Cardiovascular;  Laterality: N/A;   MASTECTOMY W/ SENTINEL NODE BIOPSY Left 09/16/2018   Procedure: LEFT TOTAL MASTECTOMY WITH LEFT AXILLARY DEEP SENTINEL LYMPH NODE BIOPSY AND INJECT BLUE DYE;  Surgeon: Dorothy Skates, MD;  Location: [place];  Service: General;  Laterality: Left;   MASTECTOMY, PARTIAL Left 2000   PORTACATH PLACEMENT Right 09/16/2018   Procedure: INSERTION PORT-A-CATH;  Surgeon: Dorothy Skates, MD;  Location: [place];  Service: General;  Laterality: Right;   TONSILLECTOMY       FAMILY HISTORY: Family History  Problem Relation Age of Onset   Heart disease Mother    Hypertension Father    Cancer Father    Prostate cancer Father    Hypertension Sister    Breast cancer Sister        dx 81s   Heart disease Sister    Hypertension Brother    Hyperlipidemia Maternal Grandmother    Stroke Maternal Grandmother    Hypertension Brother    Breast cancer Maternal Aunt    Lung cancer Maternal Aunt    Esophageal cancer Maternal Aunt    Stomach cancer Paternal Grandmother    Dorothy Frederick's father died from prostate cancer at age 39. Patients' mother died from heart complications at age 20. The patient has 2 half brothers and 2 half sisters. Patient denies anyone in her family having ovarian, prostate, or pancreatic cancer. 1 maternal aunt and 78 half aunt (half-sister of the patient's mother) had breast cancer breast cancer.    GYNECOLOGIC HISTORY:  No LMP recorded. Patient is postmenopausal. 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 She is divorced. She lives with her daughter and two grandchildren. Her daughter, Dorothy Frederick, is 72 and works for [employer] as a Clinical research associate.  Dorothy Frederick's children Dorothy Frederick, 13, and Dorothy Frederick, 10 also live with the patient. Lake's other daughter, Dorothy Frederick, is a Licensed conveyancer at [employer]. Dorothy Frederick has 4 grandchildren in total.   ADVANCED DIRECTIVES:  Not in place. Dorothy Frederick was given the appropriate forms on 07/22/18 to fill out and return at their own discretion.     HEALTH MAINTENANCE: Social History   Tobacco Use   Smoking status: Former Smoker    Packs/day: 1.00    Years: 38.00    Pack years: 38.00    Types: Cigarettes   Smokeless tobacco: Never Used  Substance Use Topics   Alcohol use: No   Drug use: No    Colonoscopy: 2017  PAP:    Bone density: yes, [facility]; osteoporosis Mammography: up to date  Allergies  Allergen Reactions   Benadryl [Diphenhydramine Hcl (Sleep)] Palpitations    Increased heartrate   Sulfa Antibiotics Rash    Current Outpatient Medications  Medication Sig Dispense Refill   albuterol (PROVENTIL HFA;VENTOLIN HFA) 108 (90 BASE) MCG/ACT inhaler Inhale 2 puffs into the lungs every 6 (six) hours as needed for wheezing or shortness of breath.  alendronate (FOSAMAX) 70 MG tablet Take 1 tablet by mouth once a week.     atorvastatin (LIPITOR) 10 MG tablet Take 40 mg by mouth daily.      Cholecalciferol 1000 UNITS capsule Take 500 Units by mouth daily.      escitalopram (LEXAPRO) 10 MG tablet Take 10 mg by mouth daily.  3   famotidine (PEPCID) 40 MG tablet Take 40 mg by mouth daily.     Fluticasone-Umeclidin-Vilant (TRELEGY ELLIPTA) 100-62.5-25 MCG/INH AEPB Inhale 1 puff into the lungs daily as needed.     levothyroxine (SYNTHROID, LEVOTHROID) 50 MCG tablet Take 1 tablet by mouth daily.     lidocaine-prilocaine (EMLA) cream Apply to affected area once 30 g 3   LORazepam (ATIVAN) 0.5 MG tablet Take 1 tablet (0.5 mg total) by mouth at bedtime as needed (Nausea or vomiting). 30 tablet 0   olmesartan (BENICAR) 20 MG tablet Take 20 mg by mouth daily.  11   ondansetron (ZOFRAN) 8 MG tablet  Take 1 tablet (8 mg total) by mouth 2 (two) times daily as needed (Nausea or vomiting). 30 tablet 1   traMADol (ULTRAM) 50 MG tablet Take 1 tablet (50 mg total) by mouth every 6 (six) hours as needed (mild pain). 20 tablet 1   No current facility-administered medications for this visit.      OBJECTIVE: Middle-aged white woman who appears stated age  43:   10/29/18 0836  BP: 108/65  Pulse: 87  Resp: 18  Temp: 98.2 F (36.8 C)  SpO2: 97%   Wt Readings from Last 3 Encounters:  10/29/18 172 lb 8 oz (78.2 kg)  10/13/18 173 lb 4.8 oz (78.6 kg)  09/16/18 171 lb 15.3 oz (78 kg)   Body mass index is 29.61 kg/m.    ECOG FS:1 - Symptomatic but completely ambulatory  Sclerae unicteric, EOMs intact Wearing a mask No cervical or supraclavicular adenopathy Lungs no rales or rhonchi Heart regular rate and rhythm Abd soft, nontender, positive bowel sounds MSK no focal spinal tenderness, no upper extremity lymphedema Neuro: nonfocal, well oriented, appropriate affect Breasts: The right breast is benign.  The left breast is status post mastectomy.  There is much less fluid than previously.  There is one area of minimal dehiscence, which is healing nicely.  Both axillae are benign.   LAB RESULTS:  CMP     Component Value Date/Time   NA 141 10/21/2018 0747   K 3.7 10/21/2018 0747   CL 108 10/21/2018 0747   CO2 21 (L) 10/21/2018 0747   GLUCOSE 117 (H) 10/21/2018 0747   BUN 13 10/21/2018 0747   CREATININE 1.09 (H) 10/21/2018 0747   CREATININE 1.11 (H) 07/22/2018 0835   CREATININE 0.97 10/18/2013 1527   CALCIUM 9.2 10/21/2018 0747   PROT 7.0 10/21/2018 0747   ALBUMIN 3.7 10/21/2018 0747   AST 16 10/21/2018 0747   AST 17 07/22/2018 0835   ALT 17 10/21/2018 0747   ALT 20 07/22/2018 0835   ALKPHOS 107 10/21/2018 0747   BILITOT 0.6 10/21/2018 0747   BILITOT 0.6 07/22/2018 0835   GFRNONAA 54 (L) 10/21/2018 0747   GFRNONAA 53 (L) 07/22/2018 0835   GFRAA >60 10/21/2018 0747    GFRAA >60 07/22/2018 0835    No results found for: TOTALPROTELP, ALBUMINELP, A1GS, A2GS, BETS, BETA2SER, GAMS, MSPIKE, SPEI  No results found for: KPAFRELGTCHN, LAMBDASER, KAPLAMBRATIO  Lab Results  Component Value Date   WBC 3.8 (L) 10/29/2018   NEUTROABS 2.6 10/29/2018   HGB  11.2 (L) 10/29/2018   HCT 34.4 (L) 10/29/2018   MCV 88.2 10/29/2018   PLT 279 10/29/2018    _0 @  Lab Results  Component Value Date   LABCA2 24 09/08/2006    No components found for: BDZHGD924  No results for input(s): INR in the last 168 hours.  Lab Results  Component Value Date   LABCA2 24 09/08/2006    No results found for: QAS341  No results found for: DQQ229  No results found for: NLG921  No results found for: CA2729  No components found for: HGQUANT  No results found for: CEA1 / No results found for: CEA1   No results found for: AFPTUMOR  No results found for: [place]  No results found for: PSA1  Appointment on 10/29/2018  Component Date Value Ref Range Status   WBC 10/29/2018 3.8* 4.0 - 10.5 K/uL Final   RBC 10/29/2018 3.90  3.87 - 5.11 MIL/uL Final   Hemoglobin 10/29/2018 11.2* 12.0 - 15.0 g/dL Final   HCT 10/29/2018 34.4* 36.0 - 46.0 % Final   MCV 10/29/2018 88.2  80.0 - 100.0 fL Final   MCH 10/29/2018 28.7  26.0 - 34.0 pg Final   MCHC 10/29/2018 32.6  30.0 - 36.0 g/dL Final   RDW 10/29/2018 12.9  11.5 - 15.5 % Final   Platelets 10/29/2018 279  150 - 400 K/uL Final   nRBC 10/29/2018 0.0  0.0 - 0.2 % Final   Neutrophils Relative % 10/29/2018 70  % Final   Neutro Abs 10/29/2018 2.6  1.7 - 7.7 K/uL Final   Lymphocytes Relative 10/29/2018 18  % Final   Lymphs Abs 10/29/2018 0.7  0.7 - 4.0 K/uL Final   Monocytes Relative 10/29/2018 7  % Final   Monocytes Absolute 10/29/2018 0.3  0.1 - 1.0 K/uL Final   Eosinophils Relative 10/29/2018 3  % Final   Eosinophils Absolute 10/29/2018 0.1  0.0 - 0.5 K/uL Final   Basophils Relative 10/29/2018 1   % Final   Basophils Absolute 10/29/2018 0.0  0.0 - 0.1 K/uL Final   Immature Granulocytes 10/29/2018 1  % Final   Abs Immature Granulocytes 10/29/2018 0.03  0.00 - 0.07 K/uL Final   Performed at [facility], [place] [address] [place], [place] [address]    (this displays the last labs from the last 3 days)  No results found for: TOTALPROTELP, ALBUMINELP, A1GS, A2GS, BETS, BETA2SER, GAMS, MSPIKE, SPEI (this displays SPEP labs)  No results found for: KPAFRELGTCHN, LAMBDASER, KAPLAMBRATIO (kappa/lambda light chains)  No results found for: HGBA, HGBA2QUANT, HGBFQUANT, HGBSQUAN (Hemoglobinopathy evaluation)   No results found for: LDH  No results found for: IRON, TIBC, IRONPCTSAT (Iron and TIBC)  No results found for: FERRITIN  Urinalysis No results found for: COLORURINE, APPEARANCEUR, LABSPEC, PHURINE, GLUCOSEU, HGBUR, BILIRUBINUR, KETONESUR, PROTEINUR, UROBILINOGEN, NITRITE, LEUKOCYTESUR   STUDIES:  No results found.   ELIGIBLE FOR AVAILABLE RESEARCH PROTOCOL: no   ASSESSMENT: 63 y.o. [place], [place] woman status post left breast upper outer quadrant biopsy 07/13/2018 for a clinical T1c N0, stage IA invasive ductal carcinoma, grade 3, estrogen and progesterone receptor negative, HER-2 amplified, with an MIB-1 of 70%  (1) status post left mastectomy with sentinel lymph node sampling 09/16/2018 for a pT2, pN0, stage IIA invasive ductal carcinoma, grade 3, with negative margins  (a) a total of 6 sentinel lymph nodes were removed  (b) the patient is not planning on reconstruction  (2) adjuvant chemotherapy to consist of paclitaxel and trastuzumab weekly  x12 to start 10/22/2018  (3) trastuzumab to be continued every 21 days to total 1 year  (a) echo 07/27/2018 shows an ejection fraction in the 55-60% range  (b) Next echo scheduled for mid December 2020  (4) no indication for postmastectomy radiation  (5) genetic testing on 07/22/2018 through the  [facility] Common Hereditary cancer panel found no pathogenic mutations.in APC, ATM, AXIN2, BARD1, BMPR1A, BRCA1, BRCA2, BRIP1, CDH1, CDKN2A (p14ARF), CDKN2A (p16INK4a), CKD4, CHEK2, CTNNA1, DICER1, EPCAM (Deletion/duplication testing only), GREM1 (promoter region deletion/duplication testing only), KIT, MEN1, MLH1, MSH2, MSH3, MSH6, MUTYH, NBN, NF1, NHTL1, PALB2, PDGFRA, PMS2, POLD1, POLE, PTEN, RAD50, RAD51C, RAD51D, SDHB, SDHC, SDHD, SMAD4, SMARCA4. STK11, TP53, TSC1, TSC2, and VHL.  The following genes were evaluated for sequence changes only: SDHA and HOXB13 c.251G>A variant only.   PLAN: Dorothy Frederick did well with her first cycle.  She will do even better with 2-day cycle, since the first 1 is the most difficult one.  I did suggest you take some stool softeners and possibly MiraLAX next couple of days to avoid the mild constipation she experienced.  Since we only just started the trastuzumab last week, she will not need to repeat an echo until 2- 3 months from now, which will be late December in this case.  She will see [place] again next week, and after that we will see her every other week, while she continues weekly treatments, until her ninth dose from which point we will see her on a weekly basis until she completes her chemotherapy  She knows to call for any other issue that may develop before the next visit.   Chrisandra Wiemers, Dorothy Dad, MD  10/29/18 8:53 AM Medical Oncology and Hematology [facility] [place], [place] [address] Tel. [phone]    Fax. [phone]   I, Wilburn Mylar, am acting as scribe for Dr. Virgie Frederick. Erasmo Vertz.  I, Lurline Del MD, have reviewed the above documentation for accuracy and completeness, and I agree with the above.

## 2018-10-29 ENCOUNTER — Telehealth: Payer: Self-pay | Admitting: *Deleted

## 2018-10-29 ENCOUNTER — Inpatient Hospital Stay: Payer: BC Managed Care – PPO

## 2018-10-29 ENCOUNTER — Other Ambulatory Visit: Payer: Self-pay

## 2018-10-29 ENCOUNTER — Inpatient Hospital Stay (HOSPITAL_BASED_OUTPATIENT_CLINIC_OR_DEPARTMENT_OTHER): Payer: BC Managed Care – PPO | Admitting: Oncology

## 2018-10-29 ENCOUNTER — Inpatient Hospital Stay: Payer: BC Managed Care – PPO | Attending: Oncology

## 2018-10-29 VITALS — BP 108/65 | HR 87 | Temp 98.2°F | Resp 18 | Ht 64.0 in | Wt 172.5 lb

## 2018-10-29 DIAGNOSIS — Z5111 Encounter for antineoplastic chemotherapy: Secondary | ICD-10-CM | POA: Diagnosis not present

## 2018-10-29 DIAGNOSIS — E039 Hypothyroidism, unspecified: Secondary | ICD-10-CM | POA: Insufficient documentation

## 2018-10-29 DIAGNOSIS — C50412 Malignant neoplasm of upper-outer quadrant of left female breast: Secondary | ICD-10-CM | POA: Insufficient documentation

## 2018-10-29 DIAGNOSIS — Z23 Encounter for immunization: Secondary | ICD-10-CM | POA: Diagnosis not present

## 2018-10-29 DIAGNOSIS — I10 Essential (primary) hypertension: Secondary | ICD-10-CM | POA: Insufficient documentation

## 2018-10-29 DIAGNOSIS — Z171 Estrogen receptor negative status [ER-]: Secondary | ICD-10-CM | POA: Insufficient documentation

## 2018-10-29 DIAGNOSIS — J449 Chronic obstructive pulmonary disease, unspecified: Secondary | ICD-10-CM | POA: Insufficient documentation

## 2018-10-29 DIAGNOSIS — Z9012 Acquired absence of left breast and nipple: Secondary | ICD-10-CM | POA: Diagnosis not present

## 2018-10-29 DIAGNOSIS — Z95828 Presence of other vascular implants and grafts: Secondary | ICD-10-CM

## 2018-10-29 DIAGNOSIS — N184 Chronic kidney disease, stage 4 (severe): Secondary | ICD-10-CM | POA: Insufficient documentation

## 2018-10-29 DIAGNOSIS — Z79899 Other long term (current) drug therapy: Secondary | ICD-10-CM | POA: Insufficient documentation

## 2018-10-29 DIAGNOSIS — Z801 Family history of malignant neoplasm of trachea, bronchus and lung: Secondary | ICD-10-CM | POA: Diagnosis not present

## 2018-10-29 DIAGNOSIS — Z87891 Personal history of nicotine dependence: Secondary | ICD-10-CM | POA: Diagnosis not present

## 2018-10-29 DIAGNOSIS — I158 Other secondary hypertension: Secondary | ICD-10-CM

## 2018-10-29 DIAGNOSIS — F419 Anxiety disorder, unspecified: Secondary | ICD-10-CM | POA: Diagnosis not present

## 2018-10-29 DIAGNOSIS — Z803 Family history of malignant neoplasm of breast: Secondary | ICD-10-CM | POA: Diagnosis not present

## 2018-10-29 DIAGNOSIS — Z8042 Family history of malignant neoplasm of prostate: Secondary | ICD-10-CM | POA: Diagnosis not present

## 2018-10-29 DIAGNOSIS — Z800 Family history of malignant neoplasm of digestive organs: Secondary | ICD-10-CM | POA: Insufficient documentation

## 2018-10-29 DIAGNOSIS — F329 Major depressive disorder, single episode, unspecified: Secondary | ICD-10-CM | POA: Diagnosis not present

## 2018-10-29 DIAGNOSIS — Z5112 Encounter for antineoplastic immunotherapy: Secondary | ICD-10-CM | POA: Diagnosis not present

## 2018-10-29 LAB — CBC WITH DIFFERENTIAL/PLATELET
Abs Immature Granulocytes: 0.03 10*3/uL (ref 0.00–0.07)
Basophils Absolute: 0 10*3/uL (ref 0.0–0.1)
Basophils Relative: 1 %
Eosinophils Absolute: 0.1 10*3/uL (ref 0.0–0.5)
Eosinophils Relative: 3 %
HCT: 34.4 % — ABNORMAL LOW (ref 36.0–46.0)
Hemoglobin: 11.2 g/dL — ABNORMAL LOW (ref 12.0–15.0)
Immature Granulocytes: 1 %
Lymphocytes Relative: 18 %
Lymphs Abs: 0.7 10*3/uL (ref 0.7–4.0)
MCH: 28.7 pg (ref 26.0–34.0)
MCHC: 32.6 g/dL (ref 30.0–36.0)
MCV: 88.2 fL (ref 80.0–100.0)
Monocytes Absolute: 0.3 10*3/uL (ref 0.1–1.0)
Monocytes Relative: 7 %
Neutro Abs: 2.6 10*3/uL (ref 1.7–7.7)
Neutrophils Relative %: 70 %
Platelets: 279 10*3/uL (ref 150–400)
RBC: 3.9 MIL/uL (ref 3.87–5.11)
RDW: 12.9 % (ref 11.5–15.5)
WBC: 3.8 10*3/uL — ABNORMAL LOW (ref 4.0–10.5)
nRBC: 0 % (ref 0.0–0.2)

## 2018-10-29 LAB — COMPREHENSIVE METABOLIC PANEL
ALT: 45 U/L — ABNORMAL HIGH (ref 0–44)
AST: 24 U/L (ref 15–41)
Albumin: 3.5 g/dL (ref 3.5–5.0)
Alkaline Phosphatase: 98 U/L (ref 38–126)
Anion gap: 9 (ref 5–15)
BUN: 17 mg/dL (ref 8–23)
CO2: 23 mmol/L (ref 22–32)
Calcium: 9.3 mg/dL (ref 8.9–10.3)
Chloride: 108 mmol/L (ref 98–111)
Creatinine, Ser: 1.09 mg/dL — ABNORMAL HIGH (ref 0.44–1.00)
GFR calc Af Amer: >60 mL/min (ref >60)
GFR calc non Af Amer: 54 mL/min — ABNORMAL LOW (ref >60)
Glucose, Bld: 89 mg/dL (ref 70–99)
Potassium: 4.3 mmol/L (ref 3.5–5.1)
Sodium: 140 mmol/L (ref 135–145)
Total Bilirubin: 0.4 mg/dL (ref 0.3–1.2)
Total Protein: 6.9 g/dL (ref 6.5–8.1)

## 2018-10-29 MED ORDER — ALTEPLASE 2 MG IJ SOLR
2.0000 mg | Freq: Once | INTRAMUSCULAR | Status: AC | PRN
  Administered 2018-10-29: 08:00:00 2 mg
  Filled 2018-10-29: qty 2

## 2018-10-29 MED ORDER — ACETAMINOPHEN 325 MG PO TABS
650.0000 mg | ORAL_TABLET | Freq: Once | ORAL | Status: AC
  Administered 2018-10-29: 650 mg via ORAL

## 2018-10-29 MED ORDER — FAMOTIDINE IN NACL 20-0.9 MG/50ML-% IV SOLN
INTRAVENOUS | Status: AC
  Filled 2018-10-29: qty 50

## 2018-10-29 MED ORDER — TRASTUZUMAB-DKST CHEMO 150 MG IV SOLR
150.0000 mg | Freq: Once | INTRAVENOUS | Status: AC
  Administered 2018-10-29: 150 mg via INTRAVENOUS
  Filled 2018-10-29: qty 7.14

## 2018-10-29 MED ORDER — DIPHENHYDRAMINE HCL 50 MG/ML IJ SOLN
INTRAMUSCULAR | Status: AC
  Filled 2018-10-29: qty 1

## 2018-10-29 MED ORDER — SODIUM CHLORIDE 0.9 % IV SOLN
20.0000 mg | Freq: Once | INTRAVENOUS | Status: AC
  Administered 2018-10-29: 20 mg via INTRAVENOUS
  Filled 2018-10-29: qty 20

## 2018-10-29 MED ORDER — SODIUM CHLORIDE 0.9 % IV SOLN
Freq: Once | INTRAVENOUS | Status: AC
  Administered 2018-10-29: 09:00:00 via INTRAVENOUS
  Filled 2018-10-29: qty 250

## 2018-10-29 MED ORDER — SODIUM CHLORIDE 0.9% FLUSH
10.0000 mL | INTRAVENOUS | Status: DC | PRN
  Administered 2018-10-29: 08:00:00 10 mL via INTRAVENOUS
  Filled 2018-10-29: qty 10

## 2018-10-29 MED ORDER — ACETAMINOPHEN 325 MG PO TABS
ORAL_TABLET | ORAL | Status: AC
  Filled 2018-10-29: qty 2

## 2018-10-29 MED ORDER — TRASTUZUMAB-DKST CHEMO 150 MG IV SOLR: 2.0000 mg/kg | Freq: Once | INTRAVENOUS | Status: DC

## 2018-10-29 MED ORDER — ALTEPLASE 2 MG IJ SOLR
INTRAMUSCULAR | Status: AC
  Filled 2018-10-29: qty 2

## 2018-10-29 MED ORDER — SODIUM CHLORIDE 0.9% FLUSH
10.0000 mL | INTRAVENOUS | Status: DC | PRN
  Administered 2018-10-29: 10 mL
  Filled 2018-10-29: qty 10

## 2018-10-29 MED ORDER — HEPARIN SOD (PORK) LOCK FLUSH 100 UNIT/ML IV SOLN
500.0000 [IU] | Freq: Once | INTRAVENOUS | Status: AC | PRN
  Administered 2018-10-29: 500 [IU]
  Filled 2018-10-29: qty 5

## 2018-10-29 MED ORDER — DIPHENHYDRAMINE HCL 50 MG/ML IJ SOLN
25.0000 mg | Freq: Once | INTRAMUSCULAR | Status: AC
  Administered 2018-10-29: 25 mg via INTRAVENOUS

## 2018-10-29 MED ORDER — SODIUM CHLORIDE 0.9 % IV SOLN
80.0000 mg/m2 | Freq: Once | INTRAVENOUS | Status: AC
  Administered 2018-10-29: 150 mg via INTRAVENOUS
  Filled 2018-10-29: qty 25

## 2018-10-29 MED ORDER — FAMOTIDINE IN NACL 20-0.9 MG/50ML-% IV SOLN
20.0000 mg | Freq: Once | INTRAVENOUS | Status: AC
  Administered 2018-10-29: 20 mg via INTRAVENOUS

## 2018-10-29 NOTE — Patient Instructions (Signed)
[facility] Discharge Instructions for Patients Receiving Chemotherapy  Today you received the following chemotherapy agents Taxol, Herceptin.  To help prevent nausea and vomiting after your treatment, we encourage you to take your nausea medication.   If you develop nausea and vomiting that is not controlled by your nausea medication, call the clinic.   BELOW ARE SYMPTOMS THAT SHOULD BE REPORTED IMMEDIATELY:  *FEVER GREATER THAN 100.5 F  *CHILLS WITH OR WITHOUT FEVER  NAUSEA AND VOMITING THAT IS NOT CONTROLLED WITH YOUR NAUSEA MEDICATION  *UNUSUAL SHORTNESS OF BREATH  *UNUSUAL BRUISING OR BLEEDING  TENDERNESS IN MOUTH AND THROAT WITH OR WITHOUT PRESENCE OF ULCERS  *URINARY PROBLEMS  *BOWEL PROBLEMS  UNUSUAL RASH Items with * indicate a potential emergency and should be followed up as soon as possible.  Feel free to call the clinic should you have any questions or concerns. The clinic phone number is [phone].  Please show the [facility] at check-in to the Emergency Department and triage nurse.  Trastuzumab injection for infusion What is this medicine? TRASTUZUMAB (tras TOO zoo mab) is a monoclonal antibody. It is used to treat breast cancer and stomach cancer. This medicine may be used for other purposes; ask your health care provider or pharmacist if you have questions. COMMON BRAND NAME(S): Herceptin, Galvin Proffer, Trazimera What should I tell my health care provider before I take this medicine? They need to know if you have any of these conditions:  heart disease  heart failure  lung or breathing disease, like asthma  an unusual or allergic reaction to trastuzumab, benzyl alcohol, or other medications, foods, dyes, or preservatives  pregnant or trying to get pregnant  breast-feeding How should I use this medicine? This drug is given as an infusion into a vein. It is administered in a hospital or clinic by a  specially trained health care professional. Talk to your pediatrician regarding the use of this medicine in children. This medicine is not approved for use in children. Overdosage: If you think you have taken too much of this medicine contact a poison control center or emergency room at once. NOTE: This medicine is only for you. Do not share this medicine with others. What if I miss a dose? It is important not to miss a dose. Call your doctor or health care professional if you are unable to keep an appointment. What may interact with this medicine? This medicine may interact with the following medications:  certain types of chemotherapy, such as daunorubicin, doxorubicin, epirubicin, and idarubicin This list may not describe all possible interactions. Give your health care provider a list of all the medicines, herbs, non-prescription drugs, or dietary supplements you use. Also tell them if you smoke, drink alcohol, or use illegal drugs. Some items may interact with your medicine. What should I watch for while using this medicine? Visit your doctor for checks on your progress. Report any side effects. Continue your course of treatment even though you feel ill unless your doctor tells you to stop. Call your doctor or health care professional for advice if you get a fever, chills or sore throat, or other symptoms of a cold or flu. Do not treat yourself. Try to avoid being around people who are sick. You may experience fever, chills and shaking during your first infusion. These effects are usually mild and can be treated with other medicines. Report any side effects during the infusion to your health care professional. Fever and chills usually  do not happen with later infusions. Do not become pregnant while taking this medicine or for 7 months after stopping it. Women should inform their doctor if they wish to become pregnant or think they might be pregnant. Women of child-bearing potential will need to  have a negative pregnancy test before starting this medicine. There is a potential for serious side effects to an unborn child. Talk to your health care professional or pharmacist for more information. Do not breast-feed an infant while taking this medicine or for 7 months after stopping it. Women must use effective birth control with this medicine. What side effects may I notice from receiving this medicine? Side effects that you should report to your doctor or health care professional as soon as possible:  allergic reactions like skin rash, itching or hives, swelling of the face, lips, or tongue  chest pain or palpitations  cough  dizziness  feeling faint or lightheaded, falls  fever  general ill feeling or flu-like symptoms  signs of worsening heart failure like breathing problems; swelling in your legs and feet  unusually weak or tired Side effects that usually do not require medical attention (report to your doctor or health care professional if they continue or are bothersome):  bone pain  changes in taste  diarrhea  joint pain  nausea/vomiting  weight loss This list may not describe all possible side effects. Call your doctor for medical advice about side effects. You may report side effects to FDA at 1-800-FDA-1088. Where should I keep my medicine? This drug is given in a hospital or clinic and will not be stored at home. NOTE: This sheet is a summary. It may not cover all possible information. If you have questions about this medicine, talk to your doctor, pharmacist, or health care provider.  2020 Elsevier/Gold Standard (2016-01-02 14:37:52) Paclitaxel injection What is this medicine? PACLITAXEL (PAK li TAX el) is a chemotherapy drug. It targets fast dividing cells, like cancer cells, and causes these cells to die. This medicine is used to treat ovarian cancer, breast cancer, lung cancer, Kaposi's sarcoma, and other cancers. This medicine may be used for other  purposes; ask your health care provider or pharmacist if you have questions. COMMON BRAND NAME(S): Onxol, Taxol What should I tell my health care provider before I take this medicine? They need to know if you have any of these conditions:  history of irregular heartbeat  liver disease  low blood counts, like low white cell, platelet, or red cell counts  lung or breathing disease, like asthma  tingling of the fingers or toes, or other nerve disorder  an unusual or allergic reaction to paclitaxel, alcohol, polyoxyethylated castor oil, other chemotherapy, other medicines, foods, dyes, or preservatives  pregnant or trying to get pregnant  breast-feeding How should I use this medicine? This drug is given as an infusion into a vein. It is administered in a hospital or clinic by a specially trained health care professional. Talk to your pediatrician regarding the use of this medicine in children. Special care may be needed. Overdosage: If you think you have taken too much of this medicine contact a poison control center or emergency room at once. NOTE: This medicine is only for you. Do not share this medicine with others. What if I miss a dose? It is important not to miss your dose. Call your doctor or health care professional if you are unable to keep an appointment. What may interact with this medicine? Do not take this  medicine with any of the following medications:  disulfiram  metronidazole This medicine may also interact with the following medications:  antiviral medicines for hepatitis, HIV or AIDS  certain antibiotics like erythromycin and clarithromycin  certain medicines for fungal infections like ketoconazole and itraconazole  certain medicines for seizures like carbamazepine, phenobarbital, phenytoin  gemfibrozil  nefazodone  rifampin  St. John's wort This list may not describe all possible interactions. Give your health care provider a list of all the  medicines, herbs, non-prescription drugs, or dietary supplements you use. Also tell them if you smoke, drink alcohol, or use illegal drugs. Some items may interact with your medicine. What should I watch for while using this medicine? Your condition will be monitored carefully while you are receiving this medicine. You will need important blood work done while you are taking this medicine. This medicine can cause serious allergic reactions. To reduce your risk you will need to take other medicine(s) before treatment with this medicine. If you experience allergic reactions like skin rash, itching or hives, swelling of the face, lips, or tongue, tell your doctor or health care professional right away. In some cases, you may be given additional medicines to help with side effects. Follow all directions for their use. This drug may make you feel generally unwell. This is not uncommon, as chemotherapy can affect healthy cells as well as cancer cells. Report any side effects. Continue your course of treatment even though you feel ill unless your doctor tells you to stop. Call your doctor or health care professional for advice if you get a fever, chills or sore throat, or other symptoms of a cold or flu. Do not treat yourself. This drug decreases your body's ability to fight infections. Try to avoid being around people who are sick. This medicine may increase your risk to bruise or bleed. Call your doctor or health care professional if you notice any unusual bleeding. Be careful brushing and flossing your teeth or using a toothpick because you may get an infection or bleed more easily. If you have any dental work done, tell your dentist you are receiving this medicine. Avoid taking products that contain aspirin, acetaminophen, ibuprofen, naproxen, or ketoprofen unless instructed by your doctor. These medicines may hide a fever. Do not become pregnant while taking this medicine. Women should inform their doctor if  they wish to become pregnant or think they might be pregnant. There is a potential for serious side effects to an unborn child. Talk to your health care professional or pharmacist for more information. Do not breast-feed an infant while taking this medicine. Men are advised not to father a child while receiving this medicine. This product may contain alcohol. Ask your pharmacist or healthcare provider if this medicine contains alcohol. Be sure to tell all healthcare providers you are taking this medicine. Certain medicines, like metronidazole and disulfiram, can cause an unpleasant reaction when taken with alcohol. The reaction includes flushing, headache, nausea, vomiting, sweating, and increased thirst. The reaction can last from 30 minutes to several hours. What side effects may I notice from receiving this medicine? Side effects that you should report to your doctor or health care professional as soon as possible:  allergic reactions like skin rash, itching or hives, swelling of the face, lips, or tongue  breathing problems  changes in vision  fast, irregular heartbeat  high or low blood pressure  mouth sores  pain, tingling, numbness in the hands or feet  signs of decreased platelets or  bleeding - bruising, pinpoint red spots on the skin, black, tarry stools, blood in the urine  signs of decreased red blood cells - unusually weak or tired, feeling faint or lightheaded, falls  signs of infection - fever or chills, cough, sore throat, pain or difficulty passing urine  signs and symptoms of liver injury like dark yellow or brown urine; general ill feeling or flu-like symptoms; light-colored stools; loss of appetite; nausea; right upper belly pain; unusually weak or tired; yellowing of the eyes or skin  swelling of the ankles, feet, hands  unusually slow heartbeat Side effects that usually do not require medical attention (report to your doctor or health care professional if they  continue or are bothersome):  diarrhea  hair loss  loss of appetite  muscle or joint pain  nausea, vomiting  pain, redness, or irritation at site where injected  tiredness This list may not describe all possible side effects. Call your doctor for medical advice about side effects. You may report side effects to FDA at 1-800-FDA-1088. Where should I keep my medicine? This drug is given in a hospital or clinic and will not be stored at home. NOTE: This sheet is a summary. It may not cover all possible information. If you have questions about this medicine, talk to your doctor, pharmacist, or health care provider.  2020 Elsevier/Gold Standard (2016-09-10 13:14:55)

## 2018-10-29 NOTE — Patient Instructions (Signed)

## 2018-10-29 NOTE — Progress Notes (Signed)
CATHFLO given by Shawnee Knapp, RN and blood drawn peripherally by Leontine Locket, LPN at 2574

## 2018-10-29 NOTE — Telephone Encounter (Signed)
Left message to follow up to see how she is doing with her treatment.

## 2018-10-30 ENCOUNTER — Ambulatory Visit: Payer: BC Managed Care – PPO | Admitting: Rehabilitation

## 2018-10-30 ENCOUNTER — Encounter: Payer: Self-pay | Admitting: Rehabilitation

## 2018-10-30 DIAGNOSIS — C50412 Malignant neoplasm of upper-outer quadrant of left female breast: Secondary | ICD-10-CM

## 2018-10-30 DIAGNOSIS — M25612 Stiffness of left shoulder, not elsewhere classified: Secondary | ICD-10-CM | POA: Diagnosis not present

## 2018-10-30 DIAGNOSIS — I890 Lymphedema, not elsewhere classified: Secondary | ICD-10-CM | POA: Diagnosis not present

## 2018-10-30 DIAGNOSIS — R293 Abnormal posture: Secondary | ICD-10-CM | POA: Diagnosis not present

## 2018-10-30 DIAGNOSIS — Z170 Estrogen receptor positive status [ER+]: Secondary | ICD-10-CM | POA: Diagnosis not present

## 2018-10-30 NOTE — Therapy (Signed)
[place], [place], [address] Phone: [phone]   Fax:  [phone]  Physical Therapy Treatment  Patient Details  Name: Dorothy Frederick MRN: [id number] Date of Birth: 28-Jan-1955 Referring Provider (PT): Dr. Fanny Skates   Encounter Date: 10/30/2018  PT End of Session - 10/30/18 0845    Visit Number  5    Number of Visits  10    Date for PT Re-Evaluation  11/19/18    PT Start Time  0802    PT Stop Time  0842    PT Time Calculation (min)  40 min    Activity Tolerance  Patient tolerated treatment well    Behavior During Therapy  [facility] for tasks assessed/performed       Past Medical History:  Diagnosis Date  . Angina at rest [facility])   . Anxiety   . Cancer [facility]) 2000   left breast cancer-partial mastectomy  . COPD (chronic obstructive pulmonary disease) ([facility])   . Depression   . Family history of breast cancer   . Family history of esophageal cancer   . Family history of lung cancer   . Family history of prostate cancer   . GERD (gastroesophageal reflux disease)   . Hyperlipidemia   . Hypertension   . Hypothyroidism   . Recurrent breast cancer, left [facility])     Past Surgical History:  Procedure Laterality Date  . ADENOIDECTOMY    . LEFT HEART CATHETERIZATION WITH CORONARY ANGIOGRAM N/A 10/21/2013   Procedure: LEFT HEART CATHETERIZATION WITH CORONARY ANGIOGRAM;  Surgeon: Burnell Blanks, MD;  Location: [facility] CATH LAB;  Service: Cardiovascular;  Laterality: N/A;  . MASTECTOMY W/ SENTINEL NODE BIOPSY Left 09/16/2018   Procedure: LEFT TOTAL MASTECTOMY WITH LEFT AXILLARY DEEP SENTINEL LYMPH NODE BIOPSY AND INJECT BLUE DYE;  Surgeon: Fanny Skates, MD;  Location: [facility];  Service: General;  Laterality: Left;  [facility] MASTECTOMY, PARTIAL Left 2000  . PORTACATH PLACEMENT Right 09/16/2018   Procedure: INSERTION PORT-A-CATH;  Surgeon: Fanny Skates, MD;  Location: [facility];  Service: General;  Laterality: Right;  . TONSILLECTOMY      There were no vitals filed for this visit.  Subjective Assessment - 10/30/18 0808    Subjective  I had my infusion yesterday so I am tired now.  I am cutting my hair Sunday because it is falling out.  I got almost 168m drained 2 days ago. My chest feel better.  I feel about 85%  just limited with reaching up.    Pertinent History  Patient was diagnosed on 07/06/2018 with left grade III invasive ductal carcinoma breast cancer. Patient underwent a left mastectomy and sentinel node biopsy (0/6 nodes positive) on 09/16/2018. It is ER/PR negative and HER2 positive. She has a history of left breast cancer in 2000 when she had a lumpectomy, sentinel node biopsy, and radiation. She reported 1 axillary node was removed. She has had mild left arm lymphedema but reports it has resolved. She has COPD.    Patient Stated Goals  Check my arm and try to get rod of the swelling in my chest/arm.    Currently in Pain?  No/denies                       [facility] 10/30/18 0001      Shoulder Exercises: Supine   Protraction  10 reps    Protraction Limitations  with purple ball  moderate difficulty isollating movement    Flexion  10 reps    Flexion Limitations  press with purple ball     Diagonals  10 reps;AROM;Left      Shoulder Exercises: Standing   Retraction  Both;10 reps    Theraband Level (Shoulder Retraction)  Level 1 (Yellow)      Shoulder Exercises: Pulleys   Flexion  2 minutes    Scaption  2 minutes      Shoulder Exercises: Therapy Ball   Flexion  Both;10 reps      Manual Therapy   Passive ROM  to the left shoulder to tolerance into flexion, abduction, ER at 45 deg of abduction                  PT Long Term Goals - 10/26/18 4081      PT LONG TERM GOAL #1   Title  Patient will demonstrate she has regained full shoulder ROM and function post operatively compared to  baselines.    Time  4    Period  Weeks    Status  On-going      PT LONG TERM GOAL #2   Title  Patient will increase left shoulder active flexion to >/= 130 degrees for increased ease reaching overhead.    Baseline  114 degrees at re-eval, 130 on 10/26/2018    Status  Achieved      PT LONG TERM GOAL #3   Title  Patient will increase left shoulder active abduction to >/= 150 degrees for increased ease reaching overhead and to return to baseline.    Baseline  112 degrees at re-eval, 150 on 10/26/2018    Status  Achieved      PT LONG TERM GOAL #4   Title  Improve DASH score to be </= 5 for improved overall upper extremity function.    Baseline  18.18    Status  On-going      PT LONG TERM GOAL #5   Title  Patient will verbalize good understanding of lymphedema risk reduction practices.    Time  4    Period  Weeks    Status  On-going            Plan - 10/30/18 0846    Clinical Impression Statement  Pt much improved today with elimination of her seroma, AROM almost full except for slight limitation into flexion with pulling, limited by fatigue and not feeling well due to chemotherapy infusion yesterday.  Her WBC count has decreased to around 2500 per pt so we discussed extra precautions regardingCOVID etc and letting us know if they drop much more to focus on home stretching visits.    PT Frequency  2x / week    PT Duration  4 weeks    PT Treatment/Interventions  ADLs/Self Care Home Management;Therapeutic exercise;Patient/family education;Manual techniques;Manual lymph drainage;Therapeutic activities;Passive range of motion;Scar mobilization    PT Next Visit Plan  may be close to DC when flexion improves a few degrees and pt ind with supine scap.  cont PROM and pulleysas indicated. Progress with posture and strengthening, monitor return of seroma in lateral chest.  Educate/give pt handout on exercise/walking for chemo.    Consulted and Agree with Plan of Care  Patient       Patient  will benefit from skilled therapeutic intervention in order to improve the following deficits and impairments:     Visit Diagnosis: Malignant neoplasm of upper-outer quadrant of left breast in female, estrogen receptor  positive (HCC)  Abnormal posture  Lymphedema, not elsewhere classified  Stiffness of left shoulder, not elsewhere classified     Problem List Patient Active Problem List   Diagnosis Date Noted  . Pre-operative clearance 08/10/2018  . Genetic testing 07/30/2018  . Family history of breast cancer   . Family history of prostate cancer   . Family history of lung cancer   . Family history of esophageal cancer   . Malignant neoplasm of upper-outer quadrant of left breast in female, estrogen receptor negative ([place]) 07/15/2018  . CAD due to calcified coronary lesion 07/30/2014  . HTN (hypertension) 11/08/2013  . S/P cardiac cath 11/08/2013  . Abnormal nuclear stress test 10/16/2013  . Abnormal ECG 09/15/2013  . History of breast cancer, left, status post partial mastectomy and XRT 09/15/2013  . Hypercholesterolemia 09/15/2013  . History of tobacco use 09/15/2013  . Exertional dyspnea 09/15/2013    Stark Bray 10/30/2018, 8:50 AM  [place], [place], [address] Phone: [phone]   Fax:  [phone]  Name: Mariam Helbert MRN: [id number] Date of Birth: 25-Nov-1955

## 2018-11-02 ENCOUNTER — Other Ambulatory Visit: Payer: Self-pay

## 2018-11-02 ENCOUNTER — Ambulatory Visit: Payer: BC Managed Care – PPO | Admitting: Physical Therapy

## 2018-11-02 DIAGNOSIS — M25612 Stiffness of left shoulder, not elsewhere classified: Secondary | ICD-10-CM

## 2018-11-02 DIAGNOSIS — R293 Abnormal posture: Secondary | ICD-10-CM | POA: Diagnosis not present

## 2018-11-02 DIAGNOSIS — C50412 Malignant neoplasm of upper-outer quadrant of left female breast: Secondary | ICD-10-CM | POA: Diagnosis not present

## 2018-11-02 DIAGNOSIS — I890 Lymphedema, not elsewhere classified: Secondary | ICD-10-CM | POA: Diagnosis not present

## 2018-11-02 DIAGNOSIS — Z170 Estrogen receptor positive status [ER+]: Secondary | ICD-10-CM | POA: Diagnosis not present

## 2018-11-02 NOTE — Patient Instructions (Signed)
1. Decompression Exercise     Cancer Rehab [phone]    Lie on back on firm surface, knees bent, feet flat, arms turned up, out to sides, backs of hands down. Time _5-15__ minutes. Surface: floor   2. Shoulder Press    Start in Decompression Exercise position. Press shoulders downward towards supporting surface. Hold __2-3__ seconds while counting out loud. Repeat _3-5___ times. Do _1-2___ times per day.   3. Head Press    Bring cervical spine (neck) into neutral position (by either tucking the chin towards the chest or tilting the chin upward). Feel weight on back of head. Press head downward into supporting surface.    Hold _2-3__ seconds. Repeat _3-5__ times. Do _1-2__ times per day.   4. Leg Lengthener    Straighten one leg. Pull toes AND forefoot toward knee, extend heel. Lengthen leg by pulling pelvis away from ribs. Hold _2-3__ seconds. Relax. Repeat __4-6__ times. Do other leg.  Surface: floor   5. Leg Press    Straighten one leg down to floor keeping leg aligned with hip. Pull toes AND forefoot toward knee; extend heel.  Press entire leg downward (as if pressing leg into sandy beach). DO NOT BEND KNEE. Hold _2-3__ seconds. Do __4-6__ times. Repeat with other leg.      PELVIC TILT  Lie on back, legs bent. Exhale, tilting top of pelvis back, pubic bone up, to flatten lower back. Inhale, rolling pelvis opposite way, top forward, pubic bone down, arch in back. Repeat __10__ times. Do __2__ sessions per day. Copyright  VHI. All rights reserved.     Lie with hips and knees bent. Slowly inhale, and then exhale. Pull navel toward spine and tighten pelvic floor. Hold for __10_ seconds. Continue to breathe in and out during hold. Rest for _10__ seconds. Repeat __10_ times. Do __2-3_ times a day.   Copyright  VHI. All rights reserved.  Knee Fold   Lie on back, legs bent, arms by sides. Exhale, lifting knee to chest. Inhale, returning. Keep abdominals flat, navel to  spine. Repeat __10__ times, alternating legs. Do __2__ sessions per day.  Copyright  VHI. All rights reserved.  Knee Drop   Keep pelvis stable. Without rotating hips, slowly drop knee to side, pause, return to center, bring knee across midline toward opposite hip. Feel obliques engaging. Repeat for ___10_ times each leg.  Copyright  VHI. All rights reserved.  Isometric Hold With Pelvic Floor (Hook-Lying)    Copyright  VHI. All rights reserved.  Heel Slide to Straight   Slide one leg down to straight. Return. Be sure pelvis does not rock forward, tilt, rotate, or tip to side. Do _10__ times. Restabilize pelvis. Repeat with other leg. Do __1-2_ sets, __2_ times per day.  [email or web address].exer.[email or web address]   Copyright  VHI. All rights reserved.    [email or web address]

## 2018-11-02 NOTE — Therapy (Signed)
[place], [place], [address] Phone: [phone]   Fax:  [phone]  Physical Therapy Treatment  Patient Details  Name: Dorothy Frederick MRN: [id number] Date of Birth: 10-Mar-1955 Referring Provider (PT): Dr. Fanny Skates   Encounter Date: 11/02/2018  PT End of Session - 11/02/18 0849    Visit Number  6    Number of Visits  10    PT Start Time  0805    PT Stop Time  0843    PT Time Calculation (min)  38 min    Activity Tolerance  Patient tolerated treatment well    Behavior During Therapy  [facility] for tasks assessed/performed       Past Medical History:  Diagnosis Date  . Angina at rest [facility])   . Anxiety   . Cancer [facility]) 2000   left breast cancer-partial mastectomy  . COPD (chronic obstructive pulmonary disease) ([place])   . Depression   . Family history of breast cancer   . Family history of esophageal cancer   . Family history of lung cancer   . Family history of prostate cancer   . GERD (gastroesophageal reflux disease)   . Hyperlipidemia   . Hypertension   . Hypothyroidism   . Recurrent breast cancer, left [facility])     Past Surgical History:  Procedure Laterality Date  . ADENOIDECTOMY    . LEFT HEART CATHETERIZATION WITH CORONARY ANGIOGRAM N/A 10/21/2013   Procedure: LEFT HEART CATHETERIZATION WITH CORONARY ANGIOGRAM;  Surgeon: Burnell Blanks, MD;  Location: [facility] CATH LAB;  Service: Cardiovascular;  Laterality: N/A;  . MASTECTOMY W/ SENTINEL NODE BIOPSY Left 09/16/2018   Procedure: LEFT TOTAL MASTECTOMY WITH LEFT AXILLARY DEEP SENTINEL LYMPH NODE BIOPSY AND INJECT BLUE DYE;  Surgeon: Fanny Skates, MD;  Location: [place];  Service: General;  Laterality: Left;  [facility] MASTECTOMY, PARTIAL Left 2000  . PORTACATH PLACEMENT Right 09/16/2018   Procedure: INSERTION PORT-A-CATH;  Surgeon: Fanny Skates, MD;  Location: [place];  Service: General;  Laterality:  Right;  . TONSILLECTOMY      There were no vitals filed for this visit.  Subjective Assessment - 11/02/18 0807    Subjective  "I'm doing so much better"  Pt states she is doing better with her shoulder since they drained. She says she is just tired.    Pertinent History  Patient was diagnosed on 07/06/2018 with left grade III invasive ductal carcinoma breast cancer. Patient underwent a left mastectomy and sentinel node biopsy (0/6 nodes positive) on 09/16/2018. It is ER/PR negative and HER2 positive. She has a history of left breast cancer in 2000 when she had a lumpectomy, sentinel node biopsy, and radiation. She reported 1 axillary node was removed. She has had mild left arm lymphedema but reports it has resolved. She has COPD.    Patient Stated Goals  Check my arm and try to get rod of the swelling in my chest/arm.    Currently in Pain?  No/denies         [facility] PT Assessment - 11/02/18 0001      AROM   Left Shoulder Flexion  150 Degrees    Left Shoulder ABduction  160 Degrees           Quick Dash - 11/02/18 0001    Open a tight or new jar  Mild difficulty    Do heavy household chores (wash walls, wash floors)  Mild difficulty    Carry a  shopping bag or briefcase  No difficulty    Wash your back  No difficulty    Use a knife to cut food  No difficulty    Recreational activities in which you take some force or impact through your arm, shoulder, or hand (golf, hammering, tennis)  No difficulty    During the past week, to what extent has your arm, shoulder or hand problem interfered with your normal social activities with family, friends, neighbors, or groups?  Not at all    During the past week, to what extent has your arm, shoulder or hand problem limited your work or other regular daily activities  Not at all    Arm, shoulder, or hand pain.  None    Tingling (pins and needles) in your arm, shoulder, or hand  None    Difficulty Sleeping  No difficulty    DASH Score  4.55 %              [facility] Adult PT Treatment/Exercise - 11/02/18 0001      Self-Care   Self-Care  Other Self-Care Comments    Other Self-Care Comments   reviewed energy conservation and fatigue managment strategies       Exercises   Exercises  Lumbar;Other Exercises    Other Exercises   gave pt link for [employer] and to  start any strengthening with no weights, then use water bottles and progress from there       Lumbar Exercises: Standing   Functional Squats  5 reps    Functional Squats Limitations  at wall with extra cues to bring hips back to touch wall       Lumbar Exercises: Supine   Clam  5 reps    Bent Knee Raise  5 reps    Bridge  5 reps      Shoulder Exercises: Supine   Other Supine Exercises  meeks decompression with cues for scapular retraction and shoudler external and internal rotation              PT Education - 11/02/18 0843    Education Details  HEP    Person(s) Educated  Patient    Methods  Explanation;Demonstration;Handout;Verbal cues;Tactile cues    Comprehension  Returned demonstration          PT Long Term Goals - 11/02/18 0810      PT LONG TERM GOAL #1   Title  Patient will demonstrate she has regained full shoulder ROM and function post operatively compared to baselines.    Baseline  Pt reports she she has full function    Status  Achieved      PT LONG TERM GOAL #2   Title  Patient will increase left shoulder active flexion to >/= 130 degrees for increased ease reaching overhead.    Status  Achieved      PT LONG TERM GOAL #3   Title  Patient will increase left shoulder active abduction to >/= 150 degrees for increased ease reaching overhead and to return to baseline.    Status  Achieved      PT LONG TERM GOAL #4   Title  Improve DASH score to be </= 5 for improved overall upper extremity function.    Baseline  18.18 on eval 4.55 on 11/02/2018    Status  Achieved      PT LONG TERM GOAL #5   Title  Patient will verbalize good  understanding of lymphedema risk reduction practices.  Baseline  pt will able to verbalize, she has a compression sleeve from 8 years ago that still fits.and she had bras    Status  Achieved            Plan - 11/02/18 0845    Clinical Impression Statement  Pt is doing very well.  Her only complaint is with fatigue from chemo, Discussed ideas for scheduling her day and energy consercation and reinforced that she continue to exercise. Added more ideas for exercise to do at home that include core and LE as well as UE.  Pt feels that she can follow through at home and will call if she has any problems or futher questions    Rehab Potential  Excellent    PT Frequency  2x / week    PT Duration  4 weeks    PT Treatment/Interventions  ADLs/Self Care Home Management;Therapeutic exercise;Patient/family education;Manual techniques;Manual lymph drainage;Therapeutic activities;Passive range of motion;Scar mobilization    PT Next Visit Plan  Discharge    PT Home Exercise Plan  Standing cane exercises and post op shoulder ROM HEP    Consulted and Agree with Plan of Care  Patient       Patient will benefit from skilled therapeutic intervention in order to improve the following deficits and impairments:  Pain, Impaired UE functional use, Decreased knowledge of precautions, Postural dysfunction, Decreased range of motion, Increased fascial restricitons, Increased edema  Visit Diagnosis: No diagnosis found.     Problem List Patient Active Problem List   Diagnosis Date Noted  . Pre-operative clearance 08/10/2018  . Genetic testing 07/30/2018  . Family history of breast cancer   . Family history of prostate cancer   . Family history of lung cancer   . Family history of esophageal cancer   . Malignant neoplasm of upper-outer quadrant of left breast in female, estrogen receptor negative ([place]) 07/15/2018  . CAD due to calcified coronary lesion 07/30/2014  . HTN (hypertension) 11/08/2013  . S/P  cardiac cath 11/08/2013  . Abnormal nuclear stress test 10/16/2013  . Abnormal ECG 09/15/2013  . History of breast cancer, left, status post partial mastectomy and XRT 09/15/2013  . Hypercholesterolemia 09/15/2013  . History of tobacco use 09/15/2013  . Exertional dyspnea 09/15/2013   PHYSICAL THERAPY DISCHARGE SUMMARY  Visits from Start of Care: 6  Current functional level related to goals / functional outcomes: As above    Remaining deficits: Pt with fatigue    Education / Equipment: Home exercise, lymphedema risk reduction  Plan: Patient agrees to discharge.  Patient goals were partially met. Patient is being discharged due to meeting the stated rehab goals.  ?????         Donato Heinz. Owens Shark PT  [place]wood Levo 11/02/2018, 8:50 AM  [place], [place], [address] Phone: [phone]   Fax:  [phone]  Name: Dorothy Frederick MRN: [id number] Date of Birth: 03-17-1955

## 2018-11-05 ENCOUNTER — Inpatient Hospital Stay: Payer: BC Managed Care – PPO

## 2018-11-05 ENCOUNTER — Encounter: Payer: Self-pay | Admitting: Adult Health

## 2018-11-05 ENCOUNTER — Inpatient Hospital Stay (HOSPITAL_BASED_OUTPATIENT_CLINIC_OR_DEPARTMENT_OTHER): Payer: BC Managed Care – PPO | Admitting: Adult Health

## 2018-11-05 ENCOUNTER — Telehealth: Payer: Self-pay | Admitting: Adult Health

## 2018-11-05 ENCOUNTER — Other Ambulatory Visit: Payer: Self-pay

## 2018-11-05 VITALS — HR 78

## 2018-11-05 VITALS — BP 103/61 | HR 100 | Temp 98.2°F | Resp 18 | Ht 64.0 in | Wt 173.1 lb

## 2018-11-05 DIAGNOSIS — Z171 Estrogen receptor negative status [ER-]: Secondary | ICD-10-CM | POA: Diagnosis not present

## 2018-11-05 DIAGNOSIS — C50412 Malignant neoplasm of upper-outer quadrant of left female breast: Secondary | ICD-10-CM

## 2018-11-05 DIAGNOSIS — Z9012 Acquired absence of left breast and nipple: Secondary | ICD-10-CM | POA: Diagnosis not present

## 2018-11-05 DIAGNOSIS — Z79899 Other long term (current) drug therapy: Secondary | ICD-10-CM | POA: Diagnosis not present

## 2018-11-05 DIAGNOSIS — I158 Other secondary hypertension: Secondary | ICD-10-CM

## 2018-11-05 DIAGNOSIS — Z5111 Encounter for antineoplastic chemotherapy: Secondary | ICD-10-CM | POA: Diagnosis not present

## 2018-11-05 DIAGNOSIS — Z23 Encounter for immunization: Secondary | ICD-10-CM

## 2018-11-05 DIAGNOSIS — Z5112 Encounter for antineoplastic immunotherapy: Secondary | ICD-10-CM | POA: Diagnosis not present

## 2018-11-05 DIAGNOSIS — Z95828 Presence of other vascular implants and grafts: Secondary | ICD-10-CM | POA: Insufficient documentation

## 2018-11-05 DIAGNOSIS — F329 Major depressive disorder, single episode, unspecified: Secondary | ICD-10-CM | POA: Diagnosis not present

## 2018-11-05 DIAGNOSIS — Z803 Family history of malignant neoplasm of breast: Secondary | ICD-10-CM | POA: Diagnosis not present

## 2018-11-05 DIAGNOSIS — Z801 Family history of malignant neoplasm of trachea, bronchus and lung: Secondary | ICD-10-CM | POA: Diagnosis not present

## 2018-11-05 DIAGNOSIS — F419 Anxiety disorder, unspecified: Secondary | ICD-10-CM | POA: Diagnosis not present

## 2018-11-05 DIAGNOSIS — J449 Chronic obstructive pulmonary disease, unspecified: Secondary | ICD-10-CM | POA: Diagnosis not present

## 2018-11-05 DIAGNOSIS — E039 Hypothyroidism, unspecified: Secondary | ICD-10-CM | POA: Diagnosis not present

## 2018-11-05 DIAGNOSIS — N184 Chronic kidney disease, stage 4 (severe): Secondary | ICD-10-CM | POA: Diagnosis not present

## 2018-11-05 DIAGNOSIS — Z87891 Personal history of nicotine dependence: Secondary | ICD-10-CM | POA: Diagnosis not present

## 2018-11-05 DIAGNOSIS — I10 Essential (primary) hypertension: Secondary | ICD-10-CM | POA: Diagnosis not present

## 2018-11-05 LAB — COMPREHENSIVE METABOLIC PANEL
ALT: 30 U/L (ref 0–44)
AST: 17 U/L (ref 15–41)
Albumin: 3.3 g/dL — ABNORMAL LOW (ref 3.5–5.0)
Alkaline Phosphatase: 88 U/L (ref 38–126)
Anion gap: 11 (ref 5–15)
BUN: 12 mg/dL (ref 8–23)
CO2: 22 mmol/L (ref 22–32)
Calcium: 8.8 mg/dL — ABNORMAL LOW (ref 8.9–10.3)
Chloride: 106 mmol/L (ref 98–111)
Creatinine, Ser: 1.07 mg/dL — ABNORMAL HIGH (ref 0.44–1.00)
GFR calc Af Amer: >60 mL/min (ref >60)
GFR calc non Af Amer: 55 mL/min — ABNORMAL LOW (ref >60)
Glucose, Bld: 98 mg/dL (ref 70–99)
Potassium: 3.9 mmol/L (ref 3.5–5.1)
Sodium: 139 mmol/L (ref 135–145)
Total Bilirubin: 0.8 mg/dL (ref 0.3–1.2)
Total Protein: 6.5 g/dL (ref 6.5–8.1)

## 2018-11-05 LAB — CBC WITH DIFFERENTIAL/PLATELET
Abs Immature Granulocytes: 0.09 10*3/uL — ABNORMAL HIGH (ref 0.00–0.07)
Basophils Absolute: 0 10*3/uL (ref 0.0–0.1)
Basophils Relative: 1 %
Eosinophils Absolute: 0.2 10*3/uL (ref 0.0–0.5)
Eosinophils Relative: 4 %
HCT: 32.9 % — ABNORMAL LOW (ref 36.0–46.0)
Hemoglobin: 10.8 g/dL — ABNORMAL LOW (ref 12.0–15.0)
Immature Granulocytes: 3 %
Lymphocytes Relative: 20 %
Lymphs Abs: 0.7 10*3/uL (ref 0.7–4.0)
MCH: 28.6 pg (ref 26.0–34.0)
MCHC: 32.8 g/dL (ref 30.0–36.0)
MCV: 87 fL (ref 80.0–100.0)
Monocytes Absolute: 0.3 10*3/uL (ref 0.1–1.0)
Monocytes Relative: 8 %
Neutro Abs: 2.2 10*3/uL (ref 1.7–7.7)
Neutrophils Relative %: 64 %
Platelets: 273 10*3/uL (ref 150–400)
RBC: 3.78 MIL/uL — ABNORMAL LOW (ref 3.87–5.11)
RDW: 13 % (ref 11.5–15.5)
WBC: 3.4 10*3/uL — ABNORMAL LOW (ref 4.0–10.5)
nRBC: 0 % (ref 0.0–0.2)

## 2018-11-05 MED ORDER — SODIUM CHLORIDE 0.9 % IV SOLN
20.0000 mg | Freq: Once | INTRAVENOUS | Status: AC
  Administered 2018-11-05: 20 mg via INTRAVENOUS
  Filled 2018-11-05: qty 20

## 2018-11-05 MED ORDER — SODIUM CHLORIDE 0.9% FLUSH
10.0000 mL | INTRAVENOUS | Status: DC | PRN
  Administered 2018-11-05: 10 mL
  Filled 2018-11-05: qty 10

## 2018-11-05 MED ORDER — INFLUENZA VAC SPLIT QUAD 0.5 ML IM SUSY
PREFILLED_SYRINGE | INTRAMUSCULAR | Status: AC
  Filled 2018-11-05: qty 0.5

## 2018-11-05 MED ORDER — FAMOTIDINE IN NACL 20-0.9 MG/50ML-% IV SOLN
20.0000 mg | Freq: Once | INTRAVENOUS | Status: AC
  Administered 2018-11-05: 20 mg via INTRAVENOUS

## 2018-11-05 MED ORDER — HEPARIN SOD (PORK) LOCK FLUSH 100 UNIT/ML IV SOLN
500.0000 [IU] | Freq: Once | INTRAVENOUS | Status: AC | PRN
  Administered 2018-11-05: 500 [IU]
  Filled 2018-11-05: qty 5

## 2018-11-05 MED ORDER — SODIUM CHLORIDE 0.9 % IV SOLN
80.0000 mg/m2 | Freq: Once | INTRAVENOUS | Status: AC
  Administered 2018-11-05: 150 mg via INTRAVENOUS
  Filled 2018-11-05: qty 25

## 2018-11-05 MED ORDER — ACETAMINOPHEN 325 MG PO TABS
ORAL_TABLET | ORAL | Status: AC
  Filled 2018-11-05: qty 2

## 2018-11-05 MED ORDER — FAMOTIDINE IN NACL 20-0.9 MG/50ML-% IV SOLN
INTRAVENOUS | Status: AC
  Filled 2018-11-05: qty 50

## 2018-11-05 MED ORDER — SODIUM CHLORIDE 0.9 % IV SOLN
Freq: Once | INTRAVENOUS | Status: AC
  Administered 2018-11-05: 09:00:00 via INTRAVENOUS
  Filled 2018-11-05: qty 250

## 2018-11-05 MED ORDER — DIPHENHYDRAMINE HCL 50 MG/ML IJ SOLN
25.0000 mg | Freq: Once | INTRAMUSCULAR | Status: AC
  Administered 2018-11-05: 25 mg via INTRAVENOUS

## 2018-11-05 MED ORDER — DIPHENHYDRAMINE HCL 50 MG/ML IJ SOLN
INTRAMUSCULAR | Status: AC
  Filled 2018-11-05: qty 1

## 2018-11-05 MED ORDER — INFLUENZA VAC SPLIT QUAD 0.5 ML IM SUSY
0.5000 mL | PREFILLED_SYRINGE | Freq: Once | INTRAMUSCULAR | Status: AC
  Administered 2018-11-05: 0.5 mL via INTRAMUSCULAR

## 2018-11-05 MED ORDER — ACETAMINOPHEN 325 MG PO TABS
650.0000 mg | ORAL_TABLET | Freq: Once | ORAL | Status: AC
  Administered 2018-11-05: 650 mg via ORAL

## 2018-11-05 MED ORDER — TRASTUZUMAB-DKST CHEMO 150 MG IV SOLR
150.0000 mg | Freq: Once | INTRAVENOUS | Status: AC
  Administered 2018-11-05: 150 mg via INTRAVENOUS
  Filled 2018-11-05: qty 7.14

## 2018-11-05 NOTE — Telephone Encounter (Signed)
No los per 10/15. °

## 2018-11-05 NOTE — Progress Notes (Signed)
Dorothy Frederick  [phone]) [phone] [phone]    ID: Dorothy Frederick DOB: [birth year]  MR#: [id number]  XQJ#:[id number]  Patient Care Team: Dorothy Bien, MD as PCP - General (Family Medicine) Dorothy Latch, MD as PCP - Cardiology (Cardiology) Dorothy Kaufmann, RN as Oncology Nurse Navigator Dorothy Germany, RN as Oncology Nurse Navigator Dorothy Skates, MD as Consulting Physician (General Surgery) Dorothy Frederick, Dorothy Dad, MD as Consulting Physician (Oncology) Dorothy Pray, MD as Consulting Physician (Radiation Oncology) Dorothy Frederick, Dorothy Gobble, MD as Consulting Physician (Cardiology) OTHER MD:   CHIEF COMPLAINT: Estrogen receptor negative breast cancer  CURRENT TREATMENT: Adjuvant chemotherapy and anti-HER-2 immunotherapy   INTERVAL HISTORY: Dorothy Frederick returns today for follow-up and treatment of her estrogen receptor negative breast cancer.   She began adjuvant chemotherapy consisting of paclitaxel and trastuzumab two weeks ago.  She is due for week #3 of treatment.  Since her last visit, she went to Dr. Dalbert Frederick for seroma evaluation and this past Tuesday, 11/03/2018 he drew off 95 cc of fluid.  She tolerated that well and has had no issues since that procedure. She is going to see him again on 11/13/2018.     REVIEW OF SYSTEMS: Dorothy Frederick has some mild diarrhea.  She has one episode of diarrhea per day, she says it isn't large volume, no blood, pus, mucous, it is super soft, but non watery.  She notes nasal drainage that is attributed to allergies, since this is typical for her for this time of year.  She is fatigued all the time.  She notes she is pushing through the fatigue, because lying down increases her legs cramping.  She denies peripheral neuropathy.  She has no unusual headaches or vision changes.  She is without cough, shortness of breath, palpitations, chest pain.  She is not having any nausea or vomiting, or bladder concerns.  A detailed ROS was otherwise non  contributory.     HISTORY OF CURRENT ILLNESS: From the original intake note:  Dorothy Frederick has a history of left breast cancer diagnosed in 2000, and treated with a partial mastectomy.  She has been followed by Dr. Matthew Frederick in [facility]. She was also treated with Tamoxifen for 1.5 years, which she tolerated well, but had to discontinue due to financial difficulties. She did not receive chemotherapy. She notes that her cancer was lymph node negative.  More recently, she had routine screening mammography on 07/06/2018 showing: Breast Density Category B. On mammography, there is a 1.5 cm oval mass with associated calcifications in the left breast upper outer quadrant posterior depth 6.6 cm from the nipple. This mass is approximately 3 cm anterior to the lumpectomy site in the upper outer posterior left breast. No other significant masses, calcifications, or other findings are seen in either breast.   Accordingly on 07/13/2018 she proceeded to biopsy of the left breast area in question. The pathology from this procedure showed ([id number]): invasive ductal carcinoma, grade III, 2 o'clock, 5 cm from the nipple. Prognostic indicators significant for: estrogen receptor, 0% negative and progesterone receptor, 0% negative. Proliferation marker Ki67 at 70%. HER2 positive (3+) by immunohistochemistry.  The patient's subsequent history is as detailed below.   PAST MEDICAL HISTORY: Past Medical History:  Diagnosis Date   Angina at rest [facility])    Anxiety    Cancer ([place]) 2000   left breast cancer-partial mastectomy   COPD (chronic obstructive pulmonary disease) ([place])    Depression    Family history of breast cancer    Family  history of esophageal cancer    Family history of lung cancer    Family history of prostate cancer    GERD (gastroesophageal reflux disease)    Hyperlipidemia    Hypertension    Hypothyroidism    Recurrent breast cancer, left ([place])      PAST SURGICAL  HISTORY: Past Surgical History:  Procedure Laterality Date   ADENOIDECTOMY     LEFT HEART CATHETERIZATION WITH CORONARY ANGIOGRAM N/A 10/21/2013   Procedure: LEFT HEART CATHETERIZATION WITH CORONARY ANGIOGRAM;  Surgeon: Burnell Blanks, MD;  Location: [facility] CATH LAB;  Service: Cardiovascular;  Laterality: N/A;   MASTECTOMY W/ SENTINEL NODE BIOPSY Left 09/16/2018   Procedure: LEFT TOTAL MASTECTOMY WITH LEFT AXILLARY DEEP SENTINEL LYMPH NODE BIOPSY AND INJECT BLUE DYE;  Surgeon: Dorothy Skates, MD;  Location: [place];  Service: General;  Laterality: Left;   MASTECTOMY, PARTIAL Left 2000   PORTACATH PLACEMENT Right 09/16/2018   Procedure: INSERTION PORT-A-CATH;  Surgeon: Dorothy Skates, MD;  Location: [place];  Service: General;  Laterality: Right;   TONSILLECTOMY       FAMILY HISTORY: Family History  Problem Relation Age of Onset   Heart disease Mother    Hypertension Father    Cancer Father    Prostate cancer Father    Hypertension Sister    Breast cancer Sister        dx 69s   Heart disease Sister    Hypertension Brother    Hyperlipidemia Maternal Grandmother    Stroke Maternal Grandmother    Hypertension Brother    Breast cancer Maternal Aunt    Lung cancer Maternal Aunt    Esophageal cancer Maternal Aunt    Stomach cancer Paternal Grandmother    Dorothy Frederick's father died from prostate cancer at age 27. Dorothy Frederick' mother died from heart complications at age 48. The patient has 2 half brothers and 2 half sisters. Patient denies anyone in her family having ovarian, prostate, or pancreatic cancer. 1 maternal aunt and 96 half aunt (half-sister of the patient's mother) had breast cancer breast cancer.    GYNECOLOGIC HISTORY:  No LMP recorded. Patient is postmenopausal. 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 She is divorced. She lives with her daughter and two grandchildren. Her daughter, Judson Roch, is 23 and works for [employer] as a Clinical research associate.  Sarah's children Piper, 13, and Conner, 10 also live with the patient. Dorothy Frederick's other daughter, Sharyn Lull, is a Licensed conveyancer at [employer]. Dorothy Frederick has 4 grandchildren in total.   ADVANCED DIRECTIVES:  Not in place. Anarely was given the appropriate forms on 07/22/18 to fill out and return at their own discretion.     HEALTH MAINTENANCE: Social History   Tobacco Use   Smoking status: Former Smoker    Packs/day: 1.00    Years: 38.00    Pack years: 38.00    Types: Cigarettes   Smokeless tobacco: Never Used  Substance Use Topics   Alcohol use: No   Drug use: No    Colonoscopy: 2017  PAP:    Bone density: yes, [facility]; osteoporosis Mammography: up to date  Allergies  Allergen Reactions   Benadryl [Diphenhydramine Hcl (Sleep)] Palpitations    Increased heartrate   Sulfa Antibiotics Rash    Current Outpatient Medications  Medication Sig Dispense Refill   albuterol (PROVENTIL HFA;VENTOLIN HFA) 108 (90 BASE) MCG/ACT inhaler Inhale 2 puffs into the lungs every 6 (six) hours as needed for wheezing or shortness of breath.     alendronate (FOSAMAX) 70 MG tablet Take 1 tablet by mouth once a week.     atorvastatin (LIPITOR) 10 MG tablet Take 40 mg by mouth daily.      Cholecalciferol 1000 UNITS capsule Take 500 Units by mouth daily.      escitalopram (LEXAPRO) 10 MG tablet Take 10 mg by mouth daily.  3   famotidine (PEPCID) 40 MG tablet Take 40 mg by mouth daily.     Fluticasone-Umeclidin-Vilant (TRELEGY ELLIPTA) 100-62.5-25 MCG/INH AEPB Inhale 1 puff into the lungs daily as needed.     levothyroxine (SYNTHROID, LEVOTHROID) 50 MCG tablet Take 1 tablet by mouth daily.     lidocaine-prilocaine (EMLA) cream Apply to affected area once 30 g 3    LORazepam (ATIVAN) 0.5 MG tablet Take 1 tablet (0.5 mg total) by mouth at bedtime as needed (Nausea or vomiting). 30 tablet 0   olmesartan (BENICAR) 20 MG tablet Take 20 mg by mouth daily.  11   ondansetron (ZOFRAN) 8 MG tablet Take 1 tablet (8 mg total) by mouth 2 (two) times daily as needed (Nausea or vomiting). 30 tablet 1   No current facility-administered medications for this visit.      OBJECTIVE:   Vitals:   11/05/18 0822  BP: 103/61  Pulse: 100  Resp: 18  Temp: 98.2 F (36.8 C)  SpO2: 98%   Wt Readings from Last 3 Encounters:  11/05/18 173 lb 1.6 oz (78.5 kg)  10/29/18 172 lb 8 oz (78.2 kg)  10/13/18 173 lb 4.8 oz (78.6 kg)   Body mass index is 29.71 kg/m.    ECOG FS:1 - Symptomatic but completely ambulatory GENERAL: Patient is a well appearing female in no acute distress HEENT:  Sclerae anicteric.  Oropharynx clear and moist. No ulcerations or evidence of oropharyngeal candidiasis. Neck is supple.  NODES:  No cervical, supraclavicular, or axillary lymphadenopathy palpated.  BREAST EXAM:  S/p left mastectomy, healing well, no erythema, or swelling present at this time. LUNGS:  Clear to auscultation bilaterally.  No wheezes or rhonchi. HEART:  Regular rate and rhythm. No murmur appreciated. ABDOMEN:  Soft, nontender.  Positive, normoactive bowel sounds. No organomegaly palpated. MSK:  No focal spinal tenderness to palpation. Full range of motion bilaterally in the upper extremities. EXTREMITIES:  No peripheral edema.   SKIN:  Clear with no obvious rashes or skin changes. No nail dyscrasia. NEURO:  Nonfocal. Well oriented.  Appropriate affect.     LAB RESULTS:  CMP     Component Value Date/Time   NA 140 10/29/2018 0827   K 4.3 10/29/2018 0827   CL 108 10/29/2018 0827   CO2 23 10/29/2018 0827   GLUCOSE 89 10/29/2018 0827   BUN 17 10/29/2018 0827   CREATININE 1.09 (H) 10/29/2018 0827   CREATININE 1.11 (H) 07/22/2018 0835   CREATININE 0.97 10/18/2013 1527     CALCIUM 9.3 10/29/2018 0827   PROT 6.9 10/29/2018 0827   ALBUMIN 3.5 10/29/2018 0827   AST 24 10/29/2018 0827   AST 17 07/22/2018 0835   ALT 45 (H) 10/29/2018 0827   ALT 20 07/22/2018 0835   ALKPHOS 98 10/29/2018 0827   BILITOT 0.4 10/29/2018 0827   BILITOT 0.6 07/22/2018 0835   GFRNONAA 54 (L) 10/29/2018 0827   GFRNONAA 53 (L) 07/22/2018  0835   GFRAA >60 10/29/2018 0827   GFRAA >60 07/22/2018 0835    No results found for: TOTALPROTELP, ALBUMINELP, A1GS, A2GS, BETS, BETA2SER, GAMS, MSPIKE, SPEI  No results found for: KPAFRELGTCHN, LAMBDASER, [facility]  Lab Results  Component Value Date   WBC 3.4 (L) 11/05/2018   NEUTROABS 2.2 11/05/2018   HGB 10.8 (L) 11/05/2018   HCT 32.9 (L) 11/05/2018   MCV 87.0 11/05/2018   PLT 273 11/05/2018    '@LASTCHEMISTRY' @  Lab Results  Component Value Date   LABCA2 24 09/08/2006    No components found for: [id number]  No results for input(s): INR in the last 168 hours.  Lab Results  Component Value Date   LABCA2 24 09/08/2006    No results found for: [id number]  No results found for: JIR678  No results found for: LFY101  No results found for: CA2729  No components found for: HGQUANT  No results found for: CEA1 / No results found for: CEA1   No results found for: AFPTUMOR  No results found for: [place]  No results found for: PSA1  Appointment on 11/05/2018  Component Date Value Ref Range Status   WBC 11/05/2018 3.4* 4.0 - 10.5 K/uL Final   RBC 11/05/2018 3.78* 3.87 - 5.11 MIL/uL Final   Hemoglobin 11/05/2018 10.8* 12.0 - 15.0 g/dL Final   HCT 11/05/2018 32.9* 36.0 - 46.0 % Final   MCV 11/05/2018 87.0  80.0 - 100.0 fL Final   MCH 11/05/2018 28.6  26.0 - 34.0 pg Final   MCHC 11/05/2018 32.8  30.0 - 36.0 g/dL Final   RDW 11/05/2018 13.0  11.5 - 15.5 % Final   Platelets 11/05/2018 273  150 - 400 K/uL Final   nRBC 11/05/2018 0.0  0.0 - 0.2 % Final   Neutrophils Relative % 11/05/2018 64  % Final   Neutro  Abs 11/05/2018 2.2  1.7 - 7.7 K/uL Final   Lymphocytes Relative 11/05/2018 20  % Final   Lymphs Abs 11/05/2018 0.7  0.7 - 4.0 K/uL Final   Monocytes Relative 11/05/2018 8  % Final   Monocytes Absolute 11/05/2018 0.3  0.1 - 1.0 K/uL Final   Eosinophils Relative 11/05/2018 4  % Final   Eosinophils Absolute 11/05/2018 0.2  0.0 - 0.5 K/uL Final   Basophils Relative 11/05/2018 1  % Final   Basophils Absolute 11/05/2018 0.0  0.0 - 0.1 K/uL Final   Immature Granulocytes 11/05/2018 3  % Final   Abs Immature Granulocytes 11/05/2018 0.09* 0.00 - 0.07 K/uL Final   Performed at [facility], [place] [address], [place], [place] [address]    (this displays the last labs from the last 3 days)  No results found for: TOTALPROTELP, ALBUMINELP, A1GS, A2GS, BETS, BETA2SER, GAMS, MSPIKE, SPEI (this displays SPEP labs)  No results found for: KPAFRELGTCHN, LAMBDASER, KAPLAMBRATIO (kappa/lambda light chains)  No results found for: HGBA, HGBA2QUANT, HGBFQUANT, HGBSQUAN (Hemoglobinopathy evaluation)   No results found for: LDH  No results found for: IRON, TIBC, IRONPCTSAT (Iron and TIBC)  No results found for: FERRITIN  Urinalysis No results found for: COLORURINE, APPEARANCEUR, LABSPEC, PHURINE, GLUCOSEU, HGBUR, BILIRUBINUR, KETONESUR, PROTEINUR, UROBILINOGEN, NITRITE, LEUKOCYTESUR   STUDIES:  No results found.   ELIGIBLE FOR AVAILABLE RESEARCH PROTOCOL: no   ASSESSMENT: 63 y.o. [place], [place] woman status post left breast upper outer quadrant biopsy 07/13/2018 for a clinical T1c N0, stage IA invasive ductal carcinoma, grade 3, estrogen and progesterone receptor negative, HER-2 amplified, with an MIB-1 of 70%  (  1) status post left mastectomy with sentinel lymph node sampling 09/16/2018 for a pT2, pN0, stage IIA invasive ductal carcinoma, grade 3, with negative margins  (a) a total of 6 sentinel lymph nodes were removed  (b) the patient is not planning on  reconstruction  (2) adjuvant chemotherapy with paclitaxel and trastuzumab weekly x12 started on 10/21/2018  (3) trastuzumab to be continued every 21 days to total 1 year  (a) echo 07/27/2018 shows an ejection fraction in the 55-60% range  (b) Next echo scheduled for mid December 2020  (4) no indication for postmastectomy radiation  (5) genetic testing on 07/22/2018 through the [facility] Common Hereditary cancer panel found no pathogenic mutations.in APC, ATM, AXIN2, BARD1, BMPR1A, BRCA1, BRCA2, BRIP1, CDH1, CDKN2A (p14ARF), CDKN2A (p16INK4a), CKD4, CHEK2, CTNNA1, DICER1, EPCAM (Deletion/duplication testing only), GREM1 (promoter region deletion/duplication testing only), KIT, MEN1, MLH1, MSH2, MSH3, MSH6, MUTYH, NBN, NF1, NHTL1, PALB2, PDGFRA, PMS2, POLD1, POLE, PTEN, RAD50, RAD51C, RAD51D, SDHB, SDHC, SDHD, SMAD4, SMARCA4. STK11, TP53, TSC1, TSC2, and VHL.  The following genes were evaluated for sequence changes only: SDHA and HOXB13 c.251G>A variant only.   PLAN: Shella is doing well today.  Thus far she is tolerating her treatment with Paclitaxel and Trastuzumab well and has no significant toxicities.  We of course are monitoring her closely for peripheral neuropathy which she hasn't developed, and she was recommended to continue with cryotherapy to help prevent this.  Kajal and I talked about her activity level, which she has kept up, and is doing well with.  She is managing her diarrhea, and right now it is stable.  She knows to let us know if either of those things change.  We reviewed her labs which remain stable, and though her WBC is mildly decreased, I reassured her it is within parameters of being able to receive treatment.  Her CMET is pending today.    I reviewed with Selah her upcoming appointment schedule and that initially we will see her with every other chemotherapy.  She knows to let us know if she needs anything on an infusion only day.  She was recommended to continue with the  appropriate pandemic precautions. She knows to call for any questions that may arise between now and her next appointment.  We are happy to see her sooner if needed.  A total of (30) minutes of face-to-face time was spent with this patient with greater than 50% of that time in counseling and care-coordination.    Wilber Bihari  11/05/18 8:45 AM Medical Oncology and Hematology [facility] [place], [place] [address] Tel. [phone]    Fax. [phone]

## 2018-11-05 NOTE — Patient Instructions (Signed)
[facility] Discharge Instructions for Patients Receiving Chemotherapy  Today you received the following chemotherapy agents Taxol, Herceptin.  To help prevent nausea and vomiting after your treatment, we encourage you to take your nausea medication.   If you develop nausea and vomiting that is not controlled by your nausea medication, call the clinic.   BELOW ARE SYMPTOMS THAT SHOULD BE REPORTED IMMEDIATELY:  *FEVER GREATER THAN 100.5 F  *CHILLS WITH OR WITHOUT FEVER  NAUSEA AND VOMITING THAT IS NOT CONTROLLED WITH YOUR NAUSEA MEDICATION  *UNUSUAL SHORTNESS OF BREATH  *UNUSUAL BRUISING OR BLEEDING  TENDERNESS IN MOUTH AND THROAT WITH OR WITHOUT PRESENCE OF ULCERS  *URINARY PROBLEMS  *BOWEL PROBLEMS  UNUSUAL RASH Items with * indicate a potential emergency and should be followed up as soon as possible.  Feel free to call the clinic should you have any questions or concerns. The clinic phone number is [phone].  Please show the [facility] at check-in to the Emergency Department and triage nurse.  Trastuzumab injection for infusion What is this medicine? TRASTUZUMAB (tras TOO zoo mab) is a monoclonal antibody. It is used to treat breast cancer and stomach cancer. This medicine may be used for other purposes; ask your health care provider or pharmacist if you have questions. COMMON BRAND NAME(S): Herceptin, Galvin Proffer, Trazimera What should I tell my health care provider before I take this medicine? They need to know if you have any of these conditions:  heart disease  heart failure  lung or breathing disease, like asthma  an unusual or allergic reaction to trastuzumab, benzyl alcohol, or other medications, foods, dyes, or preservatives  pregnant or trying to get pregnant  breast-feeding How should I use this medicine? This drug is given as an infusion into a vein. It is administered in a hospital or clinic by a  specially trained health care professional. Talk to your pediatrician regarding the use of this medicine in children. This medicine is not approved for use in children. Overdosage: If you think you have taken too much of this medicine contact a poison control center or emergency room at once. NOTE: This medicine is only for you. Do not share this medicine with others. What if I miss a dose? It is important not to miss a dose. Call your doctor or health care professional if you are unable to keep an appointment. What may interact with this medicine? This medicine may interact with the following medications:  certain types of chemotherapy, such as daunorubicin, doxorubicin, epirubicin, and idarubicin This list may not describe all possible interactions. Give your health care provider a list of all the medicines, herbs, non-prescription drugs, or dietary supplements you use. Also tell them if you smoke, drink alcohol, or use illegal drugs. Some items may interact with your medicine. What should I watch for while using this medicine? Visit your doctor for checks on your progress. Report any side effects. Continue your course of treatment even though you feel ill unless your doctor tells you to stop. Call your doctor or health care professional for advice if you get a fever, chills or sore throat, or other symptoms of a cold or flu. Do not treat yourself. Try to avoid being around people who are sick. You may experience fever, chills and shaking during your first infusion. These effects are usually mild and can be treated with other medicines. Report any side effects during the infusion to your health care professional. Fever and chills usually  do not happen with later infusions. Do not become pregnant while taking this medicine or for 7 months after stopping it. Women should inform their doctor if they wish to become pregnant or think they might be pregnant. Women of child-bearing potential will need to  have a negative pregnancy test before starting this medicine. There is a potential for serious side effects to an unborn child. Talk to your health care professional or pharmacist for more information. Do not breast-feed an infant while taking this medicine or for 7 months after stopping it. Women must use effective birth control with this medicine. What side effects may I notice from receiving this medicine? Side effects that you should report to your doctor or health care professional as soon as possible:  allergic reactions like skin rash, itching or hives, swelling of the face, lips, or tongue  chest pain or palpitations  cough  dizziness  feeling faint or lightheaded, falls  fever  general ill feeling or flu-like symptoms  signs of worsening heart failure like breathing problems; swelling in your legs and feet  unusually weak or tired Side effects that usually do not require medical attention (report to your doctor or health care professional if they continue or are bothersome):  bone pain  changes in taste  diarrhea  joint pain  nausea/vomiting  weight loss This list may not describe all possible side effects. Call your doctor for medical advice about side effects. You may report side effects to FDA at 1-800-FDA-1088. Where should I keep my medicine? This drug is given in a hospital or clinic and will not be stored at home. NOTE: This sheet is a summary. It may not cover all possible information. If you have questions about this medicine, talk to your doctor, pharmacist, or health care provider.  2020 Elsevier/Gold Standard (2016-01-02 14:37:52) Paclitaxel injection What is this medicine? PACLITAXEL (PAK li TAX el) is a chemotherapy drug. It targets fast dividing cells, like cancer cells, and causes these cells to die. This medicine is used to treat ovarian cancer, breast cancer, lung cancer, Kaposi's sarcoma, and other cancers. This medicine may be used for other  purposes; ask your health care provider or pharmacist if you have questions. COMMON BRAND NAME(S): Onxol, Taxol What should I tell my health care provider before I take this medicine? They need to know if you have any of these conditions:  history of irregular heartbeat  liver disease  low blood counts, like low white cell, platelet, or red cell counts  lung or breathing disease, like asthma  tingling of the fingers or toes, or other nerve disorder  an unusual or allergic reaction to paclitaxel, alcohol, polyoxyethylated castor oil, other chemotherapy, other medicines, foods, dyes, or preservatives  pregnant or trying to get pregnant  breast-feeding How should I use this medicine? This drug is given as an infusion into a vein. It is administered in a hospital or clinic by a specially trained health care professional. Talk to your pediatrician regarding the use of this medicine in children. Special care may be needed. Overdosage: If you think you have taken too much of this medicine contact a poison control center or emergency room at once. NOTE: This medicine is only for you. Do not share this medicine with others. What if I miss a dose? It is important not to miss your dose. Call your doctor or health care professional if you are unable to keep an appointment. What may interact with this medicine? Do not take this  medicine with any of the following medications:  disulfiram  metronidazole This medicine may also interact with the following medications:  antiviral medicines for hepatitis, HIV or AIDS  certain antibiotics like erythromycin and clarithromycin  certain medicines for fungal infections like ketoconazole and itraconazole  certain medicines for seizures like carbamazepine, phenobarbital, phenytoin  gemfibrozil  nefazodone  rifampin  St. John's wort This list may not describe all possible interactions. Give your health care provider a list of all the  medicines, herbs, non-prescription drugs, or dietary supplements you use. Also tell them if you smoke, drink alcohol, or use illegal drugs. Some items may interact with your medicine. What should I watch for while using this medicine? Your condition will be monitored carefully while you are receiving this medicine. You will need important blood work done while you are taking this medicine. This medicine can cause serious allergic reactions. To reduce your risk you will need to take other medicine(s) before treatment with this medicine. If you experience allergic reactions like skin rash, itching or hives, swelling of the face, lips, or tongue, tell your doctor or health care professional right away. In some cases, you may be given additional medicines to help with side effects. Follow all directions for their use. This drug may make you feel generally unwell. This is not uncommon, as chemotherapy can affect healthy cells as well as cancer cells. Report any side effects. Continue your course of treatment even though you feel ill unless your doctor tells you to stop. Call your doctor or health care professional for advice if you get a fever, chills or sore throat, or other symptoms of a cold or flu. Do not treat yourself. This drug decreases your body's ability to fight infections. Try to avoid being around people who are sick. This medicine may increase your risk to bruise or bleed. Call your doctor or health care professional if you notice any unusual bleeding. Be careful brushing and flossing your teeth or using a toothpick because you may get an infection or bleed more easily. If you have any dental work done, tell your dentist you are receiving this medicine. Avoid taking products that contain aspirin, acetaminophen, ibuprofen, naproxen, or ketoprofen unless instructed by your doctor. These medicines may hide a fever. Do not become pregnant while taking this medicine. Women should inform their doctor if  they wish to become pregnant or think they might be pregnant. There is a potential for serious side effects to an unborn child. Talk to your health care professional or pharmacist for more information. Do not breast-feed an infant while taking this medicine. Men are advised not to father a child while receiving this medicine. This product may contain alcohol. Ask your pharmacist or healthcare provider if this medicine contains alcohol. Be sure to tell all healthcare providers you are taking this medicine. Certain medicines, like metronidazole and disulfiram, can cause an unpleasant reaction when taken with alcohol. The reaction includes flushing, headache, nausea, vomiting, sweating, and increased thirst. The reaction can last from 30 minutes to several hours. What side effects may I notice from receiving this medicine? Side effects that you should report to your doctor or health care professional as soon as possible:  allergic reactions like skin rash, itching or hives, swelling of the face, lips, or tongue  breathing problems  changes in vision  fast, irregular heartbeat  high or low blood pressure  mouth sores  pain, tingling, numbness in the hands or feet  signs of decreased platelets or  bleeding - bruising, pinpoint red spots on the skin, black, tarry stools, blood in the urine  signs of decreased red blood cells - unusually weak or tired, feeling faint or lightheaded, falls  signs of infection - fever or chills, cough, sore throat, pain or difficulty passing urine  signs and symptoms of liver injury like dark yellow or brown urine; general ill feeling or flu-like symptoms; light-colored stools; loss of appetite; nausea; right upper belly pain; unusually weak or tired; yellowing of the eyes or skin  swelling of the ankles, feet, hands  unusually slow heartbeat Side effects that usually do not require medical attention (report to your doctor or health care professional if they  continue or are bothersome):  diarrhea  hair loss  loss of appetite  muscle or joint pain  nausea, vomiting  pain, redness, or irritation at site where injected  tiredness This list may not describe all possible side effects. Call your doctor for medical advice about side effects. You may report side effects to FDA at 1-800-FDA-1088. Where should I keep my medicine? This drug is given in a hospital or clinic and will not be stored at home. NOTE: This sheet is a summary. It may not cover all possible information. If you have questions about this medicine, talk to your doctor, pharmacist, or health care provider.  2020 Elsevier/Gold Standard (2016-09-10 13:14:55)

## 2018-11-06 ENCOUNTER — Encounter: Payer: Self-pay | Admitting: Rehabilitation

## 2018-11-09 ENCOUNTER — Encounter: Payer: Self-pay | Admitting: Physical Therapy

## 2018-11-12 ENCOUNTER — Inpatient Hospital Stay: Payer: BC Managed Care – PPO

## 2018-11-12 ENCOUNTER — Other Ambulatory Visit: Payer: Self-pay

## 2018-11-12 ENCOUNTER — Encounter: Payer: Self-pay | Admitting: Physical Therapy

## 2018-11-12 ENCOUNTER — Other Ambulatory Visit: Payer: Self-pay | Admitting: Oncology

## 2018-11-12 VITALS — BP 122/66 | HR 76 | Temp 98.4°F | Resp 20 | Wt 171.8 lb

## 2018-11-12 DIAGNOSIS — J449 Chronic obstructive pulmonary disease, unspecified: Secondary | ICD-10-CM | POA: Diagnosis not present

## 2018-11-12 DIAGNOSIS — Z23 Encounter for immunization: Secondary | ICD-10-CM | POA: Diagnosis not present

## 2018-11-12 DIAGNOSIS — Z171 Estrogen receptor negative status [ER-]: Secondary | ICD-10-CM

## 2018-11-12 DIAGNOSIS — F329 Major depressive disorder, single episode, unspecified: Secondary | ICD-10-CM | POA: Diagnosis not present

## 2018-11-12 DIAGNOSIS — Z79899 Other long term (current) drug therapy: Secondary | ICD-10-CM | POA: Diagnosis not present

## 2018-11-12 DIAGNOSIS — Z803 Family history of malignant neoplasm of breast: Secondary | ICD-10-CM | POA: Diagnosis not present

## 2018-11-12 DIAGNOSIS — I158 Other secondary hypertension: Secondary | ICD-10-CM

## 2018-11-12 DIAGNOSIS — Z801 Family history of malignant neoplasm of trachea, bronchus and lung: Secondary | ICD-10-CM | POA: Diagnosis not present

## 2018-11-12 DIAGNOSIS — Z9012 Acquired absence of left breast and nipple: Secondary | ICD-10-CM | POA: Diagnosis not present

## 2018-11-12 DIAGNOSIS — N184 Chronic kidney disease, stage 4 (severe): Secondary | ICD-10-CM | POA: Diagnosis not present

## 2018-11-12 DIAGNOSIS — E039 Hypothyroidism, unspecified: Secondary | ICD-10-CM | POA: Diagnosis not present

## 2018-11-12 DIAGNOSIS — C50412 Malignant neoplasm of upper-outer quadrant of left female breast: Secondary | ICD-10-CM

## 2018-11-12 DIAGNOSIS — Z5112 Encounter for antineoplastic immunotherapy: Secondary | ICD-10-CM | POA: Diagnosis not present

## 2018-11-12 DIAGNOSIS — Z87891 Personal history of nicotine dependence: Secondary | ICD-10-CM | POA: Diagnosis not present

## 2018-11-12 DIAGNOSIS — Z95828 Presence of other vascular implants and grafts: Secondary | ICD-10-CM

## 2018-11-12 DIAGNOSIS — Z5111 Encounter for antineoplastic chemotherapy: Secondary | ICD-10-CM | POA: Diagnosis not present

## 2018-11-12 DIAGNOSIS — I10 Essential (primary) hypertension: Secondary | ICD-10-CM | POA: Diagnosis not present

## 2018-11-12 DIAGNOSIS — F419 Anxiety disorder, unspecified: Secondary | ICD-10-CM | POA: Diagnosis not present

## 2018-11-12 LAB — CBC WITH DIFFERENTIAL/PLATELET
Abs Immature Granulocytes: 0.07 10*3/uL (ref 0.00–0.07)
Basophils Absolute: 0 10*3/uL (ref 0.0–0.1)
Basophils Relative: 1 %
Eosinophils Absolute: 0.1 10*3/uL (ref 0.0–0.5)
Eosinophils Relative: 4 %
HCT: 30.9 % — ABNORMAL LOW (ref 36.0–46.0)
Hemoglobin: 10.2 g/dL — ABNORMAL LOW (ref 12.0–15.0)
Immature Granulocytes: 2 %
Lymphocytes Relative: 26 %
Lymphs Abs: 0.8 10*3/uL (ref 0.7–4.0)
MCH: 29 pg (ref 26.0–34.0)
MCHC: 33 g/dL (ref 30.0–36.0)
MCV: 87.8 fL (ref 80.0–100.0)
Monocytes Absolute: 0.3 10*3/uL (ref 0.1–1.0)
Monocytes Relative: 9 %
Neutro Abs: 1.9 10*3/uL (ref 1.7–7.7)
Neutrophils Relative %: 58 %
Platelets: 351 10*3/uL (ref 150–400)
RBC: 3.52 MIL/uL — ABNORMAL LOW (ref 3.87–5.11)
RDW: 13.4 % (ref 11.5–15.5)
WBC: 3.2 10*3/uL — ABNORMAL LOW (ref 4.0–10.5)
nRBC: 0 % (ref 0.0–0.2)

## 2018-11-12 LAB — COMPREHENSIVE METABOLIC PANEL
ALT: 37 U/L (ref 0–44)
AST: 19 U/L (ref 15–41)
Albumin: 3.2 g/dL — ABNORMAL LOW (ref 3.5–5.0)
Alkaline Phosphatase: 88 U/L (ref 38–126)
Anion gap: 10 (ref 5–15)
BUN: 10 mg/dL (ref 8–23)
CO2: 23 mmol/L (ref 22–32)
Calcium: 8.6 mg/dL — ABNORMAL LOW (ref 8.9–10.3)
Chloride: 107 mmol/L (ref 98–111)
Creatinine, Ser: 1.03 mg/dL — ABNORMAL HIGH (ref 0.44–1.00)
GFR calc Af Amer: >60 mL/min (ref >60)
GFR calc non Af Amer: 58 mL/min — ABNORMAL LOW (ref >60)
Glucose, Bld: 96 mg/dL (ref 70–99)
Potassium: 3.5 mmol/L (ref 3.5–5.1)
Sodium: 140 mmol/L (ref 135–145)
Total Bilirubin: 0.5 mg/dL (ref 0.3–1.2)
Total Protein: 6.4 g/dL — ABNORMAL LOW (ref 6.5–8.1)

## 2018-11-12 MED ORDER — TRASTUZUMAB-DKST CHEMO 150 MG IV SOLR
150.0000 mg | Freq: Once | INTRAVENOUS | Status: AC
  Administered 2018-11-12: 150 mg via INTRAVENOUS
  Filled 2018-11-12: qty 7.14

## 2018-11-12 MED ORDER — SODIUM CHLORIDE 0.9% FLUSH
10.0000 mL | INTRAVENOUS | Status: DC | PRN
  Administered 2018-11-12: 10 mL
  Filled 2018-11-12: qty 10

## 2018-11-12 MED ORDER — DIPHENHYDRAMINE HCL 50 MG/ML IJ SOLN
25.0000 mg | Freq: Once | INTRAMUSCULAR | Status: AC
  Administered 2018-11-12: 25 mg via INTRAVENOUS

## 2018-11-12 MED ORDER — FAMOTIDINE IN NACL 20-0.9 MG/50ML-% IV SOLN
INTRAVENOUS | Status: AC
  Filled 2018-11-12: qty 50

## 2018-11-12 MED ORDER — DIPHENHYDRAMINE HCL 50 MG/ML IJ SOLN
INTRAMUSCULAR | Status: AC
  Filled 2018-11-12: qty 1

## 2018-11-12 MED ORDER — ACETAMINOPHEN 325 MG PO TABS
ORAL_TABLET | ORAL | Status: AC
  Filled 2018-11-12: qty 2

## 2018-11-12 MED ORDER — HEPARIN SOD (PORK) LOCK FLUSH 100 UNIT/ML IV SOLN
500.0000 [IU] | Freq: Once | INTRAVENOUS | Status: AC | PRN
  Administered 2018-11-12: 500 [IU]
  Filled 2018-11-12: qty 5

## 2018-11-12 MED ORDER — SODIUM CHLORIDE 0.9 % IV SOLN
80.0000 mg/m2 | Freq: Once | INTRAVENOUS | Status: AC
  Administered 2018-11-12: 150 mg via INTRAVENOUS
  Filled 2018-11-12: qty 25

## 2018-11-12 MED ORDER — ACETAMINOPHEN 325 MG PO TABS
650.0000 mg | ORAL_TABLET | Freq: Once | ORAL | Status: AC
  Administered 2018-11-12: 650 mg via ORAL

## 2018-11-12 MED ORDER — SODIUM CHLORIDE 0.9 % IV SOLN
Freq: Once | INTRAVENOUS | Status: AC
  Administered 2018-11-12: 13:00:00 via INTRAVENOUS
  Filled 2018-11-12: qty 250

## 2018-11-12 MED ORDER — SODIUM CHLORIDE 0.9 % IV SOLN
20.0000 mg | Freq: Once | INTRAVENOUS | Status: AC
  Administered 2018-11-12: 20 mg via INTRAVENOUS
  Filled 2018-11-12: qty 20

## 2018-11-12 MED ORDER — FAMOTIDINE IN NACL 20-0.9 MG/50ML-% IV SOLN
20.0000 mg | Freq: Once | INTRAVENOUS | Status: AC
  Administered 2018-11-12: 20 mg via INTRAVENOUS

## 2018-11-12 NOTE — Patient Instructions (Signed)
[facility] Discharge Instructions for Patients Receiving Chemotherapy  Today you received the following chemotherapy agent: Paclitaxel and Immunotherapy agent: Trastuzumab-dkst.  To help prevent nausea and vomiting after your treatment, we encourage you to take your nausea medication as directed by your MD.   If you develop nausea and vomiting that is not controlled by your nausea medication, call the clinic.   BELOW ARE SYMPTOMS THAT SHOULD BE REPORTED IMMEDIATELY:  *FEVER GREATER THAN 100.5 F  *CHILLS WITH OR WITHOUT FEVER  NAUSEA AND VOMITING THAT IS NOT CONTROLLED WITH YOUR NAUSEA MEDICATION  *UNUSUAL SHORTNESS OF BREATH  *UNUSUAL BRUISING OR BLEEDING  TENDERNESS IN MOUTH AND THROAT WITH OR WITHOUT PRESENCE OF ULCERS  *URINARY PROBLEMS  *BOWEL PROBLEMS  UNUSUAL RASH Items with * indicate a potential emergency and should be followed up as soon as possible.  Feel free to call the clinic should you have any questions or concerns. The clinic phone number is [phone].  Please show the [facility] at check-in to the Emergency Department and triage nurse.  Coronavirus (COVID-19) Are you at risk?  Are you at risk for the Coronavirus (COVID-19)?  To be considered HIGH RISK for Coronavirus (COVID-19), you have to meet the following criteria:  . Traveled to [place], [place], [place], [place] or [place]; or in the [place] to [place], [place], [place], or [place]; and have fever, cough, and shortness of breath within the last 2 weeks of travel OR . Been in close contact with a person diagnosed with COVID-19 within the last 2 weeks and have fever, cough, and shortness of breath . IF YOU DO NOT MEET THESE CRITERIA, YOU ARE CONSIDERED LOW RISK FOR COVID-19.  What to do if you are HIGH RISK for COVID-19?  Marland Kitchen If you are having a medical emergency, call 911. . Seek medical care right away. Before you go to a doctor's office, urgent care or  emergency department, call ahead and tell them about your recent travel, contact with someone diagnosed with COVID-19, and your symptoms. You should receive instructions from your physician's office regarding next steps of care.  . When you arrive at healthcare provider, tell the healthcare staff immediately you have returned from visiting [place], [place], [place], [place] or [place]; or traveled in the [place] to [place], [place], [place], or [place]; in the last two weeks or you have been in close contact with a person diagnosed with COVID-19 in the last 2 weeks.   . Tell the health care staff about your symptoms: fever, cough and shortness of breath. . After you have been seen by a medical provider, you will be either: o Tested for (COVID-19) and discharged home on quarantine except to seek medical care if symptoms worsen, and asked to  - Stay home and avoid contact with others until you get your results (4-5 days)  - Avoid travel on public transportation if possible (such as bus, train, or airplane) or o Sent to the Emergency Department by EMS for evaluation, COVID-19 testing, and possible admission depending on your condition and test results.  What to do if you are LOW RISK for COVID-19?  Reduce your risk of any infection by using the same precautions used for avoiding the common cold or flu:  Marland Kitchen Wash your hands often with soap and warm water for at least 20 seconds.  If soap and water are not readily available, use an alcohol-based hand sanitizer with at least 60%  alcohol.  . If coughing or sneezing, cover your mouth and nose by coughing or sneezing into the elbow areas of your shirt or coat, into a tissue or into your sleeve (not your hands). . Avoid shaking hands with others and consider head nods or verbal greetings only. . Avoid touching your eyes, nose, or mouth with unwashed hands.  . Avoid close contact with people who are sick. . Avoid places or events with large numbers  of people in one location, like concerts or sporting events. . Carefully consider travel plans you have or are making. . If you are planning any travel outside or inside the [place], visit the CDC's Travelers' Health webpage for the latest health notices. . If you have some symptoms but not all symptoms, continue to monitor at home and seek medical attention if your symptoms worsen. . If you are having a medical emergency, call 911.   [place] / e-Visit: [email or web address]         [facility]: [facility]: [phone]                   [facility]: [phone]

## 2018-11-13 ENCOUNTER — Encounter: Payer: Self-pay | Admitting: Rehabilitation

## 2018-11-16 ENCOUNTER — Encounter: Payer: Self-pay | Admitting: Physical Therapy

## 2018-11-19 ENCOUNTER — Inpatient Hospital Stay: Payer: BC Managed Care – PPO

## 2018-11-19 ENCOUNTER — Other Ambulatory Visit: Payer: Self-pay

## 2018-11-19 ENCOUNTER — Inpatient Hospital Stay (HOSPITAL_BASED_OUTPATIENT_CLINIC_OR_DEPARTMENT_OTHER): Payer: BC Managed Care – PPO | Admitting: Adult Health

## 2018-11-19 ENCOUNTER — Encounter: Payer: Self-pay | Admitting: Physical Therapy

## 2018-11-19 ENCOUNTER — Encounter: Payer: Self-pay | Admitting: Hematology and Oncology

## 2018-11-19 VITALS — BP 135/68 | HR 98 | Temp 98.3°F | Resp 18 | Ht 64.0 in | Wt 169.4 lb

## 2018-11-19 DIAGNOSIS — Z95828 Presence of other vascular implants and grafts: Secondary | ICD-10-CM

## 2018-11-19 DIAGNOSIS — Z5111 Encounter for antineoplastic chemotherapy: Secondary | ICD-10-CM | POA: Diagnosis not present

## 2018-11-19 DIAGNOSIS — Z79899 Other long term (current) drug therapy: Secondary | ICD-10-CM | POA: Diagnosis not present

## 2018-11-19 DIAGNOSIS — Z87891 Personal history of nicotine dependence: Secondary | ICD-10-CM | POA: Diagnosis not present

## 2018-11-19 DIAGNOSIS — C50412 Malignant neoplasm of upper-outer quadrant of left female breast: Secondary | ICD-10-CM | POA: Diagnosis not present

## 2018-11-19 DIAGNOSIS — Z801 Family history of malignant neoplasm of trachea, bronchus and lung: Secondary | ICD-10-CM | POA: Diagnosis not present

## 2018-11-19 DIAGNOSIS — F329 Major depressive disorder, single episode, unspecified: Secondary | ICD-10-CM | POA: Diagnosis not present

## 2018-11-19 DIAGNOSIS — Z803 Family history of malignant neoplasm of breast: Secondary | ICD-10-CM | POA: Diagnosis not present

## 2018-11-19 DIAGNOSIS — Z171 Estrogen receptor negative status [ER-]: Secondary | ICD-10-CM

## 2018-11-19 DIAGNOSIS — I10 Essential (primary) hypertension: Secondary | ICD-10-CM | POA: Diagnosis not present

## 2018-11-19 DIAGNOSIS — J449 Chronic obstructive pulmonary disease, unspecified: Secondary | ICD-10-CM | POA: Diagnosis not present

## 2018-11-19 DIAGNOSIS — Z5112 Encounter for antineoplastic immunotherapy: Secondary | ICD-10-CM | POA: Diagnosis not present

## 2018-11-19 DIAGNOSIS — Z23 Encounter for immunization: Secondary | ICD-10-CM | POA: Diagnosis not present

## 2018-11-19 DIAGNOSIS — N184 Chronic kidney disease, stage 4 (severe): Secondary | ICD-10-CM | POA: Diagnosis not present

## 2018-11-19 DIAGNOSIS — Z9012 Acquired absence of left breast and nipple: Secondary | ICD-10-CM | POA: Diagnosis not present

## 2018-11-19 DIAGNOSIS — E039 Hypothyroidism, unspecified: Secondary | ICD-10-CM | POA: Diagnosis not present

## 2018-11-19 DIAGNOSIS — I158 Other secondary hypertension: Secondary | ICD-10-CM

## 2018-11-19 DIAGNOSIS — F419 Anxiety disorder, unspecified: Secondary | ICD-10-CM | POA: Diagnosis not present

## 2018-11-19 LAB — CBC WITH DIFFERENTIAL/PLATELET
Abs Immature Granulocytes: 0.24 10*3/uL — ABNORMAL HIGH (ref 0.00–0.07)
Basophils Absolute: 0.1 10*3/uL (ref 0.0–0.1)
Basophils Relative: 1 %
Eosinophils Absolute: 0.1 10*3/uL (ref 0.0–0.5)
Eosinophils Relative: 2 %
HCT: 28.2 % — ABNORMAL LOW (ref 36.0–46.0)
Hemoglobin: 9.3 g/dL — ABNORMAL LOW (ref 12.0–15.0)
Immature Granulocytes: 7 %
Lymphocytes Relative: 24 %
Lymphs Abs: 0.8 10*3/uL (ref 0.7–4.0)
MCH: 28.3 pg (ref 26.0–34.0)
MCHC: 33 g/dL (ref 30.0–36.0)
MCV: 85.7 fL (ref 80.0–100.0)
Monocytes Absolute: 0.5 10*3/uL (ref 0.1–1.0)
Monocytes Relative: 13 %
Neutro Abs: 1.8 10*3/uL (ref 1.7–7.7)
Neutrophils Relative %: 53 %
Platelets: 312 10*3/uL (ref 150–400)
RBC: 3.29 MIL/uL — ABNORMAL LOW (ref 3.87–5.11)
RDW: 13.6 % (ref 11.5–15.5)
WBC: 3.5 10*3/uL — ABNORMAL LOW (ref 4.0–10.5)
nRBC: 0 % (ref 0.0–0.2)

## 2018-11-19 LAB — COMPREHENSIVE METABOLIC PANEL
ALT: 31 U/L (ref 0–44)
AST: 18 U/L (ref 15–41)
Albumin: 3 g/dL — ABNORMAL LOW (ref 3.5–5.0)
Alkaline Phosphatase: 81 U/L (ref 38–126)
Anion gap: 10 (ref 5–15)
BUN: 11 mg/dL (ref 8–23)
CO2: 22 mmol/L (ref 22–32)
Calcium: 8.2 mg/dL — ABNORMAL LOW (ref 8.9–10.3)
Chloride: 105 mmol/L (ref 98–111)
Creatinine, Ser: 0.89 mg/dL (ref 0.44–1.00)
GFR calc Af Amer: >60 mL/min (ref >60)
GFR calc non Af Amer: >60 mL/min (ref >60)
Glucose, Bld: 104 mg/dL — ABNORMAL HIGH (ref 70–99)
Potassium: 3.2 mmol/L — ABNORMAL LOW (ref 3.5–5.1)
Sodium: 137 mmol/L (ref 135–145)
Total Bilirubin: 0.5 mg/dL (ref 0.3–1.2)
Total Protein: 6.1 g/dL — ABNORMAL LOW (ref 6.5–8.1)

## 2018-11-19 MED ORDER — DIPHENHYDRAMINE HCL 50 MG/ML IJ SOLN
25.0000 mg | Freq: Once | INTRAMUSCULAR | Status: AC
  Administered 2018-11-19: 25 mg via INTRAVENOUS

## 2018-11-19 MED ORDER — ACETAMINOPHEN 325 MG PO TABS
ORAL_TABLET | ORAL | Status: AC
  Filled 2018-11-19: qty 2

## 2018-11-19 MED ORDER — FAMOTIDINE IN NACL 20-0.9 MG/50ML-% IV SOLN
20.0000 mg | Freq: Once | INTRAVENOUS | Status: AC
  Administered 2018-11-19: 20 mg via INTRAVENOUS

## 2018-11-19 MED ORDER — DIPHENHYDRAMINE HCL 50 MG/ML IJ SOLN
INTRAMUSCULAR | Status: AC
  Filled 2018-11-19: qty 1

## 2018-11-19 MED ORDER — SODIUM CHLORIDE 0.9% FLUSH
10.0000 mL | INTRAVENOUS | Status: DC | PRN
  Administered 2018-11-19: 10 mL
  Filled 2018-11-19: qty 10

## 2018-11-19 MED ORDER — ACETAMINOPHEN 325 MG PO TABS
650.0000 mg | ORAL_TABLET | Freq: Once | ORAL | Status: AC
  Administered 2018-11-19: 650 mg via ORAL

## 2018-11-19 MED ORDER — CHOLESTYRAMINE 4 G PO PACK: 4.0000 g | PACK | Freq: Two times a day (BID) | ORAL | 1 refills | Status: DC

## 2018-11-19 MED ORDER — TRASTUZUMAB-DKST CHEMO 150 MG IV SOLR
150.0000 mg | Freq: Once | INTRAVENOUS | Status: AC
  Administered 2018-11-19: 150 mg via INTRAVENOUS
  Filled 2018-11-19: qty 7.14

## 2018-11-19 MED ORDER — SODIUM CHLORIDE 0.9 % IV SOLN
12.0000 mg | Freq: Once | INTRAVENOUS | Status: AC
  Administered 2018-11-19: 12 mg via INTRAVENOUS
  Filled 2018-11-19: qty 1.2

## 2018-11-19 MED ORDER — HEPARIN SOD (PORK) LOCK FLUSH 100 UNIT/ML IV SOLN
500.0000 [IU] | Freq: Once | INTRAVENOUS | Status: AC | PRN
  Administered 2018-11-19: 500 [IU]
  Filled 2018-11-19: qty 5

## 2018-11-19 MED ORDER — SODIUM CHLORIDE 0.9 % IV SOLN
Freq: Once | INTRAVENOUS | Status: AC
  Administered 2018-11-19: 14:00:00 via INTRAVENOUS
  Filled 2018-11-19: qty 250

## 2018-11-19 MED ORDER — POTASSIUM CHLORIDE ER 10 MEQ PO TBCR: 10.0000 meq | EXTENDED_RELEASE_TABLET | Freq: Every day | ORAL | 0 refills | Status: DC

## 2018-11-19 MED ORDER — FAMOTIDINE IN NACL 20-0.9 MG/50ML-% IV SOLN
INTRAVENOUS | Status: AC
  Filled 2018-11-19: qty 50

## 2018-11-19 MED ORDER — SODIUM CHLORIDE 0.9 % IV SOLN
80.0000 mg/m2 | Freq: Once | INTRAVENOUS | Status: AC
  Administered 2018-11-19: 150 mg via INTRAVENOUS
  Filled 2018-11-19: qty 25

## 2018-11-19 NOTE — Patient Instructions (Signed)
[facility] Discharge Instructions for Patients Receiving Chemotherapy  Today you received the following chemotherapy agent: Paclitaxel and Immunotherapy agent: Trastuzumab-dkst.  To help prevent nausea and vomiting after your treatment, we encourage you to take your nausea medication as directed by your MD.   If you develop nausea and vomiting that is not controlled by your nausea medication, call the clinic.   BELOW ARE SYMPTOMS THAT SHOULD BE REPORTED IMMEDIATELY:  *FEVER GREATER THAN 100.5 F  *CHILLS WITH OR WITHOUT FEVER  NAUSEA AND VOMITING THAT IS NOT CONTROLLED WITH YOUR NAUSEA MEDICATION  *UNUSUAL SHORTNESS OF BREATH  *UNUSUAL BRUISING OR BLEEDING  TENDERNESS IN MOUTH AND THROAT WITH OR WITHOUT PRESENCE OF ULCERS  *URINARY PROBLEMS  *BOWEL PROBLEMS  UNUSUAL RASH Items with * indicate a potential emergency and should be followed up as soon as possible.  Feel free to call the clinic should you have any questions or concerns. The clinic phone number is [phone].  Please show the [facility] at check-in to the Emergency Department and triage nurse.  Coronavirus (COVID-19) Are you at risk?  Are you at risk for the Coronavirus (COVID-19)?  To be considered HIGH RISK for Coronavirus (COVID-19), you have to meet the following criteria:  . Traveled to [place], [place], [place], [place] or [place]; or in the [place] to [place], [place], [place], or [place]; and have fever, cough, and shortness of breath within the last 2 weeks of travel OR . Been in close contact with a person diagnosed with COVID-19 within the last 2 weeks and have fever, cough, and shortness of breath . IF YOU DO NOT MEET THESE CRITERIA, YOU ARE CONSIDERED LOW RISK FOR COVID-19.  What to do if you are HIGH RISK for COVID-19?  Marland Kitchen If you are having a medical emergency, call 911. . Seek medical care right away. Before you go to a doctor's office, urgent care or  emergency department, call ahead and tell them about your recent travel, contact with someone diagnosed with COVID-19, and your symptoms. You should receive instructions from your physician's office regarding next steps of care.  . When you arrive at healthcare provider, tell the healthcare staff immediately you have returned from visiting [place], [place], [place], [place] or [place]; or traveled in the [place] to [place], [place], [place], or [place]; in the last two weeks or you have been in close contact with a person diagnosed with COVID-19 in the last 2 weeks.   . Tell the health care staff about your symptoms: fever, cough and shortness of breath. . After you have been seen by a medical provider, you will be either: o Tested for (COVID-19) and discharged home on quarantine except to seek medical care if symptoms worsen, and asked to  - Stay home and avoid contact with others until you get your results (4-5 days)  - Avoid travel on public transportation if possible (such as bus, train, or airplane) or o Sent to the Emergency Department by EMS for evaluation, COVID-19 testing, and possible admission depending on your condition and test results.  What to do if you are LOW RISK for COVID-19?  Reduce your risk of any infection by using the same precautions used for avoiding the common cold or flu:  Marland Kitchen Wash your hands often with soap and warm water for at least 20 seconds.  If soap and water are not readily available, use an alcohol-based hand sanitizer with at least 60%  alcohol.  . If coughing or sneezing, cover your mouth and nose by coughing or sneezing into the elbow areas of your shirt or coat, into a tissue or into your sleeve (not your hands). . Avoid shaking hands with others and consider head nods or verbal greetings only. . Avoid touching your eyes, nose, or mouth with unwashed hands.  . Avoid close contact with people who are sick. . Avoid places or events with large numbers  of people in one location, like concerts or sporting events. . Carefully consider travel plans you have or are making. . If you are planning any travel outside or inside the [place], visit the CDC's Travelers' Health webpage for the latest health notices. . If you have some symptoms but not all symptoms, continue to monitor at home and seek medical attention if your symptoms worsen. . If you are having a medical emergency, call 911.   [place] / e-Visit: [email or web address]         [facility]: [facility]: [phone]                   [facility]: [phone]

## 2018-11-19 NOTE — Progress Notes (Signed)
[place]  Telephone:(336) [phone] Fax:(336) [phone]    ID: Rylynne Schicker DOB: 1955-01-29  MR#: [id number]  GNO#:[id number]  Patient Care Team: [name], MD as PCP - General (Family Medicine) Skeet Latch, MD as PCP - Cardiology (Cardiology) Mauro Kaufmann, RN as Oncology Nurse Navigator Rockwell Germany, RN as Oncology Nurse Navigator Fanny Skates, MD as Consulting Physician (General Surgery) Magrinat, Virgie Dad, MD as Consulting Physician (Oncology) Gery Pray, MD as Consulting Physician (Radiation Oncology) Croitoru, Dani Gobble, MD as Consulting Physician (Cardiology) OTHER MD:   CHIEF COMPLAINT: Estrogen receptor negative breast cancer  CURRENT TREATMENT: Adjuvant chemotherapy and anti-HER-2 immunotherapy   INTERVAL HISTORY: Rayan returns today for follow-up and treatment of her estrogen receptor negative breast cancer.   She began adjuvant chemotherapy consisting of paclitaxel and trastuzumab two weeks ago.  She is due for week #5 of treatment.  She is due to see Dr. Dalbert Batman next week for f/u on her post surgical seroma and will likely need more fluid taken off.    REVIEW OF SYSTEMS: Arion has continued diarrhea.  Typically this starts the day following chemotherapy.  She says that she has increasing stomach pain and bowel spasms, and the diarrhea continues with every meal.   The imodium she takes helps some, but then the diarrhea returns the following day.  She has mild intermittent neuropathy in her first two digits bilaterally.  This is currently resolved, and she is keeping a close eye on this.  Otherwise, she denies any fever, chills, chest pain, palpitations, cough, shortness of breath, nausea, vomiting, or any other issues.  A detailed ROS was otherwise non contributory.     HISTORY OF CURRENT ILLNESS: From the original intake note:  Hazyl Marseille has a history of left breast cancer diagnosed in 2000, and treated with a partial  mastectomy.  She has been followed by Dr. Matthew Saras in [facility]. She was also treated with Tamoxifen for 1.5 years, which she tolerated well, but had to discontinue due to financial difficulties. She did not receive chemotherapy. She notes that her cancer was lymph node negative.  More recently, she had routine screening mammography on 07/06/2018 showing: Breast Density Category B. On mammography, there is a 1.5 cm oval mass with associated calcifications in the left breast upper outer quadrant posterior depth 6.6 cm from the nipple. This mass is approximately 3 cm anterior to the lumpectomy site in the upper outer posterior left breast. No other significant masses, calcifications, or other findings are seen in either breast.   Accordingly on 07/13/2018 she proceeded to biopsy of the left breast area in question. The pathology from this procedure showed ([id number]): invasive ductal carcinoma, grade III, 2 o'clock, 5 cm from the nipple. Prognostic indicators significant for: estrogen receptor, 0% negative and progesterone receptor, 0% negative. Proliferation marker Ki67 at 70%. HER2 positive (3+) by immunohistochemistry.  The patient's subsequent history is as detailed below.   PAST MEDICAL HISTORY: Past Medical History:  Diagnosis Date   Angina at rest [facility])    Anxiety    Cancer ([place]) 2000   left breast cancer-partial mastectomy   COPD (chronic obstructive pulmonary disease) ([place])    Depression    Family history of breast cancer    Family history of esophageal cancer    Family history of lung cancer    Family history of prostate cancer    GERD (gastroesophageal reflux disease)    Hyperlipidemia    Hypertension    Hypothyroidism  Recurrent breast cancer, left ([place])      PAST SURGICAL HISTORY: Past Surgical History:  Procedure Laterality Date   ADENOIDECTOMY     LEFT HEART CATHETERIZATION WITH CORONARY ANGIOGRAM N/A 10/21/2013   Procedure: LEFT HEART  CATHETERIZATION WITH CORONARY ANGIOGRAM;  Surgeon: Burnell Blanks, MD;  Location: [facility] CATH LAB;  Service: Cardiovascular;  Laterality: N/A;   MASTECTOMY W/ SENTINEL NODE BIOPSY Left 09/16/2018   Procedure: LEFT TOTAL MASTECTOMY WITH LEFT AXILLARY DEEP SENTINEL LYMPH NODE BIOPSY AND INJECT BLUE DYE;  Surgeon: Fanny Skates, MD;  Location: [place];  Service: General;  Laterality: Left;   MASTECTOMY, PARTIAL Left 2000   PORTACATH PLACEMENT Right 09/16/2018   Procedure: INSERTION PORT-A-CATH;  Surgeon: Fanny Skates, MD;  Location: [place];  Service: General;  Laterality: Right;   TONSILLECTOMY       FAMILY HISTORY: Family History  Problem Relation Age of Onset   Heart disease Mother    Hypertension Father    Cancer Father    Prostate cancer Father    Hypertension Sister    Breast cancer Sister        dx 75s   Heart disease Sister    Hypertension Brother    Hyperlipidemia Maternal Grandmother    Stroke Maternal Grandmother    Hypertension Brother    Breast cancer Maternal Aunt    Lung cancer Maternal Aunt    Esophageal cancer Maternal Aunt    Stomach cancer Paternal Grandmother    Valmai's father died from prostate cancer at age 59. Patients' mother died from heart complications at age 11. The patient has 2 half brothers and 2 half sisters. Patient denies anyone in her family having ovarian, prostate, or pancreatic cancer. 1 maternal aunt and 70 half aunt (half-sister of the patient's mother) had breast cancer breast cancer.    GYNECOLOGIC HISTORY:  No LMP recorded. Patient is postmenopausal. 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 She is divorced. She lives with her daughter and two grandchildren.  Her daughter, Judson Roch, is 68 and works for [employer] as a Clinical research associate.  Sarah's children Piper, 13, and Conner, 10 also live with the patient. Anwen's other daughter, Sharyn Lull, is a Licensed conveyancer at [employer]. Haruye has 4 grandchildren in total.   ADVANCED DIRECTIVES:  Not in place. Bertice was given the appropriate forms on 07/22/18 to fill out and return at their own discretion.     HEALTH MAINTENANCE: Social History   Tobacco Use   Smoking status: Former Smoker    Packs/day: 1.00    Years: 38.00    Pack years: 38.00    Types: Cigarettes   Smokeless tobacco: Never Used  Substance Use Topics   Alcohol use: No   Drug use: No    Colonoscopy: 2017  PAP:    Bone density: yes, [facility]; osteoporosis Mammography: up to date  Allergies  Allergen Reactions   Benadryl [Diphenhydramine Hcl (Sleep)] Palpitations    Increased heartrate   Sulfa Antibiotics Rash    Current Outpatient Medications  Medication Sig Dispense Refill   albuterol (PROVENTIL HFA;VENTOLIN HFA) 108 (90 BASE) MCG/ACT inhaler Inhale 2 puffs into the lungs every 6 (six) hours as needed for wheezing or shortness of breath.     alendronate (FOSAMAX) 70 MG tablet  Take 1 tablet by mouth once a week.     atorvastatin (LIPITOR) 10 MG tablet Take 40 mg by mouth daily.      Cholecalciferol 1000 UNITS capsule Take 500 Units by mouth daily.      escitalopram (LEXAPRO) 10 MG tablet Take 10 mg by mouth daily.  3   famotidine (PEPCID) 40 MG tablet Take 40 mg by mouth daily.     Fluticasone-Umeclidin-Vilant (TRELEGY ELLIPTA) 100-62.5-25 MCG/INH AEPB Inhale 1 puff into the lungs daily as needed.     levothyroxine (SYNTHROID, LEVOTHROID) 50 MCG tablet Take 1 tablet by mouth daily.     lidocaine-prilocaine (EMLA) cream Apply to affected area once 30 g 3   LORazepam (ATIVAN) 0.5 MG tablet Take 1 tablet (0.5 mg total) by mouth at bedtime as needed (Nausea or vomiting). 30 tablet 0   olmesartan (BENICAR) 20 MG tablet Take 20 mg by mouth  daily.  11   ondansetron (ZOFRAN) 8 MG tablet Take 1 tablet (8 mg total) by mouth 2 (two) times daily as needed (Nausea or vomiting). 30 tablet 1   No current facility-administered medications for this visit.      OBJECTIVE:   Vitals:   11/19/18 1247  BP: 135/68  Pulse: 98  Resp: 18  Temp: 98.3 F (36.8 C)  SpO2: 98%   Wt Readings from Last 3 Encounters:  11/19/18 169 lb 6.4 oz (76.8 kg)  11/12/18 171 lb 12 oz (77.9 kg)  11/05/18 173 lb 1.6 oz (78.5 kg)   Body mass index is 29.08 kg/m.    ECOG FS:1 - Symptomatic but completely ambulatory GENERAL: Patient is a well appearing female in no acute distress HEENT:  Sclerae anicteric.  Oropharynx clear and moist. No ulcerations or evidence of oropharyngeal candidiasis. Neck is supple.  NODES:  No cervical, supraclavicular, or axillary lymphadenopathy palpated.  BREAST EXAM:  S/p left mastectomy, healing well, no erythema, or swelling present at this time. LUNGS:  Clear to auscultation bilaterally.  No wheezes or rhonchi. HEART:  Regular rate and rhythm. No murmur appreciated. ABDOMEN:  Soft, nontender.  Positive, normoactive bowel sounds. No organomegaly palpated. MSK:  No focal spinal tenderness to palpation. Full range of motion bilaterally in the upper extremities. EXTREMITIES:  No peripheral edema.   SKIN:  Clear with no obvious rashes or skin changes. No nail dyscrasia. NEURO:  Nonfocal. Well oriented.  Appropriate affect.     LAB RESULTS:  CMP     Component Value Date/Time   NA 137 11/19/2018 1207   K 3.2 (L) 11/19/2018 1207   CL 105 11/19/2018 1207   CO2 22 11/19/2018 1207   GLUCOSE 104 (H) 11/19/2018 1207   BUN 11 11/19/2018 1207   CREATININE 0.89 11/19/2018 1207   CREATININE 1.11 (H) 07/22/2018 0835   CREATININE 0.97 10/18/2013 1527   CALCIUM 8.2 (L) 11/19/2018 1207   PROT 6.1 (L) 11/19/2018 1207   ALBUMIN 3.0 (L) 11/19/2018 1207   AST 18 11/19/2018 1207   AST 17 07/22/2018 0835   ALT 31 11/19/2018 1207    ALT 20 07/22/2018 0835   ALKPHOS 81 11/19/2018 1207   BILITOT 0.5 11/19/2018 1207   BILITOT 0.6 07/22/2018 0835   GFRNONAA >60 11/19/2018 1207   GFRNONAA 53 (L) 07/22/2018 0835   GFRAA >60 11/19/2018 1207   GFRAA >60 07/22/2018 0835    No results found for: TOTALPROTELP, ALBUMINELP, A1GS, A2GS, BETS, BETA2SER, GAMS, MSPIKE, SPEI  No results found for: KPAFRELGTCHN, LAMBDASER, KAPLAMBRATIO  Lab Results  Component Value Date   WBC 3.5 (L) 11/19/2018   NEUTROABS 1.8 11/19/2018   HGB 9.3 (L) 11/19/2018   HCT 28.2 (L) 11/19/2018   MCV 85.7 11/19/2018   PLT 312 11/19/2018    '@LASTCHEMISTRY'$ @  Lab Results  Component Value Date   LABCA2 24 09/08/2006    No components found for: QQPYPP509  No results for input(s): INR in the last 168 hours.  Lab Results  Component Value Date   LABCA2 24 09/08/2006    No results found for: TOI712  No results found for: WPY099  No results found for: IPJ825  No results found for: CA2729  No components found for: HGQUANT  No results found for: CEA1 / No results found for: CEA1   No results found for: AFPTUMOR  No results found for: [place]  No results found for: PSA1  Appointment on 11/19/2018  Component Date Value Ref Range Status   Sodium 11/19/2018 137  135 - 145 mmol/L Final   Potassium 11/19/2018 3.2* 3.5 - 5.1 mmol/L Final   Chloride 11/19/2018 105  98 - 111 mmol/L Final   CO2 11/19/2018 22  22 - 32 mmol/L Final   Glucose, Bld 11/19/2018 104* 70 - 99 mg/dL Final   BUN 11/19/2018 11  8 - 23 mg/dL Final   Creatinine, Ser 11/19/2018 0.89  0.44 - 1.00 mg/dL Final   Calcium 11/19/2018 8.2* 8.9 - 10.3 mg/dL Final   Total Protein 11/19/2018 6.1* 6.5 - 8.1 g/dL Final   Albumin 11/19/2018 3.0* 3.5 - 5.0 g/dL Final   AST 11/19/2018 18  15 - 41 U/L Final   ALT 11/19/2018 31  0 - 44 U/L Final   Alkaline Phosphatase 11/19/2018 81  38 - 126 U/L Final   Total Bilirubin 11/19/2018 0.5  0.3 - 1.2 mg/dL Final   GFR  calc non Af Amer 11/19/2018 >60  >60 mL/min Final   GFR calc Af Amer 11/19/2018 >60  >60 mL/min Final   Anion gap 11/19/2018 10  5 - 15 Final   Performed at [facility], [place] [address] [place], [place] [address]   WBC 11/19/2018 3.5* 4.0 - 10.5 K/uL Final   RBC 11/19/2018 3.29* 3.87 - 5.11 MIL/uL Final   Hemoglobin 11/19/2018 9.3* 12.0 - 15.0 g/dL Final   HCT 11/19/2018 28.2* 36.0 - 46.0 % Final   MCV 11/19/2018 85.7  80.0 - 100.0 fL Final   MCH 11/19/2018 28.3  26.0 - 34.0 pg Final   MCHC 11/19/2018 33.0  30.0 - 36.0 g/dL Final   RDW 11/19/2018 13.6  11.5 - 15.5 % Final   Platelets 11/19/2018 312  150 - 400 K/uL Final   nRBC 11/19/2018 0.0  0.0 - 0.2 % Final   Neutrophils Relative % 11/19/2018 53  % Final   Neutro Abs 11/19/2018 1.8  1.7 - 7.7 K/uL Final   Lymphocytes Relative 11/19/2018 24  % Final   Lymphs Abs 11/19/2018 0.8  0.7 - 4.0 K/uL Final   Monocytes Relative 11/19/2018 13  % Final   Monocytes Absolute 11/19/2018 0.5  0.1 - 1.0 K/uL Final   Eosinophils Relative 11/19/2018 2  % Final   Eosinophils Absolute 11/19/2018 0.1  0.0 - 0.5 K/uL Final   Basophils Relative 11/19/2018 1  % Final   Basophils Absolute 11/19/2018 0.1  0.0 - 0.1 K/uL Final   Immature Granulocytes 11/19/2018 7  % Final   metas and myelos   Abs Immature Granulocytes 11/19/2018 0.24* 0.00 - 0.07  K/uL Final   Performed at [facility], [place] [address] [place], [place] [address]    (this displays the last labs from the last 3 days)  No results found for: TOTALPROTELP, ALBUMINELP, A1GS, A2GS, BETS, BETA2SER, GAMS, MSPIKE, SPEI (this displays SPEP labs)  No results found for: KPAFRELGTCHN, LAMBDASER, KAPLAMBRATIO (kappa/lambda light chains)  No results found for: HGBA, HGBA2QUANT, HGBFQUANT, HGBSQUAN (Hemoglobinopathy evaluation)   No results found for: LDH  No results found for: IRON, TIBC, IRONPCTSAT (Iron and TIBC)  No  results found for: FERRITIN  Urinalysis No results found for: COLORURINE, APPEARANCEUR, LABSPEC, PHURINE, GLUCOSEU, HGBUR, BILIRUBINUR, KETONESUR, PROTEINUR, UROBILINOGEN, NITRITE, LEUKOCYTESUR   STUDIES:  No results found.   ELIGIBLE FOR AVAILABLE RESEARCH PROTOCOL: no   ASSESSMENT: 63 y.o. [place], [place] woman status post left breast upper outer quadrant biopsy 07/13/2018 for a clinical T1c N0, stage IA invasive ductal carcinoma, grade 3, estrogen and progesterone receptor negative, HER-2 amplified, with an MIB-1 of 70%  (1) status post left mastectomy with sentinel lymph node sampling 09/16/2018 for a pT2, pN0, stage IIA invasive ductal carcinoma, grade 3, with negative margins  (a) a total of 6 sentinel lymph nodes were removed  (b) the patient is not planning on reconstruction  (2) adjuvant chemotherapy with paclitaxel and trastuzumab weekly x12 started on 10/21/2018  (3) trastuzumab to be continued every 21 days to total 1 year  (a) echo 07/27/2018 shows an ejection fraction in the 55-60% range  (b) Next echo scheduled for mid December 2020  (4) no indication for postmastectomy radiation  (5) genetic testing on 07/22/2018 through the [facility] Common Hereditary cancer panel found no pathogenic mutations.in APC, ATM, AXIN2, BARD1, BMPR1A, BRCA1, BRCA2, BRIP1, CDH1, CDKN2A (p14ARF), CDKN2A (p16INK4a), CKD4, CHEK2, CTNNA1, DICER1, EPCAM (Deletion/duplication testing only), GREM1 (promoter region deletion/duplication testing only), KIT, MEN1, MLH1, MSH2, MSH3, MSH6, MUTYH, NBN, NF1, NHTL1, PALB2, PDGFRA, PMS2, POLD1, POLE, PTEN, RAD50, RAD51C, RAD51D, SDHB, SDHC, SDHD, SMAD4, SMARCA4. STK11, TP53, TSC1, TSC2, and VHL.  The following genes were evaluated for sequence changes only: SDHA and HOXB13 c.251G>A variant only.   PLAN: Saara is doing well today.  Her labs are stable and We reviewed them.  She will proceed with cycle 5 of her treatment today with Paclitaxel and Trastuzumab.  She  has mild intermittent and currently resolved peripheral neuropathy.  We are watching this closely and she understands that if it worsens in any way, that we will likely have to stop the Paclitaxel.  She knows to monitor for whether or not it becomes constant, and also any motor changes.  She currently has neither of these.  Tawanna is also experiencing diarrhea.  I prescribed Questran today for her to take BID PRN for her diarrhea.  She is going to try this.  I alerted her that she may experience some bloating, and to use this in combination with her Imodium.    Merideth will return next week for labs, f/u, and her next treatment.  She was recommended to continue with the appropriate pandemic precautions. She knows to call for any questions that may arise between now and her next appointment.  We are happy to see her sooner if needed.   A total of (30) minutes of face-to-face time was spent with this patient with greater than 50% of that time in counseling and care-coordination.    Wilber Bihari  11/19/18 1:40 PM Medical Oncology and Hematology [facility] [place], [place] [address]  Tel. [phone]    Fax. [phone]

## 2018-11-20 ENCOUNTER — Telehealth: Payer: Self-pay

## 2018-11-20 ENCOUNTER — Other Ambulatory Visit: Payer: Self-pay

## 2018-11-20 ENCOUNTER — Inpatient Hospital Stay (HOSPITAL_BASED_OUTPATIENT_CLINIC_OR_DEPARTMENT_OTHER): Payer: BC Managed Care – PPO | Admitting: Medical

## 2018-11-20 VITALS — BP 135/76 | HR 87 | Temp 98.7°F | Resp 18 | Ht 64.0 in | Wt 170.6 lb

## 2018-11-20 DIAGNOSIS — Z171 Estrogen receptor negative status [ER-]: Secondary | ICD-10-CM | POA: Diagnosis not present

## 2018-11-20 DIAGNOSIS — Z79899 Other long term (current) drug therapy: Secondary | ICD-10-CM | POA: Diagnosis not present

## 2018-11-20 DIAGNOSIS — Z5112 Encounter for antineoplastic immunotherapy: Secondary | ICD-10-CM | POA: Diagnosis not present

## 2018-11-20 DIAGNOSIS — F419 Anxiety disorder, unspecified: Secondary | ICD-10-CM | POA: Diagnosis not present

## 2018-11-20 DIAGNOSIS — N184 Chronic kidney disease, stage 4 (severe): Secondary | ICD-10-CM | POA: Diagnosis not present

## 2018-11-20 DIAGNOSIS — J449 Chronic obstructive pulmonary disease, unspecified: Secondary | ICD-10-CM | POA: Diagnosis not present

## 2018-11-20 DIAGNOSIS — Z803 Family history of malignant neoplasm of breast: Secondary | ICD-10-CM | POA: Diagnosis not present

## 2018-11-20 DIAGNOSIS — C50412 Malignant neoplasm of upper-outer quadrant of left female breast: Secondary | ICD-10-CM | POA: Diagnosis not present

## 2018-11-20 DIAGNOSIS — Z5111 Encounter for antineoplastic chemotherapy: Secondary | ICD-10-CM | POA: Diagnosis not present

## 2018-11-20 DIAGNOSIS — F329 Major depressive disorder, single episode, unspecified: Secondary | ICD-10-CM | POA: Diagnosis not present

## 2018-11-20 DIAGNOSIS — E039 Hypothyroidism, unspecified: Secondary | ICD-10-CM | POA: Diagnosis not present

## 2018-11-20 DIAGNOSIS — Z23 Encounter for immunization: Secondary | ICD-10-CM | POA: Diagnosis not present

## 2018-11-20 DIAGNOSIS — Z9012 Acquired absence of left breast and nipple: Secondary | ICD-10-CM | POA: Diagnosis not present

## 2018-11-20 DIAGNOSIS — Z801 Family history of malignant neoplasm of trachea, bronchus and lung: Secondary | ICD-10-CM | POA: Diagnosis not present

## 2018-11-20 DIAGNOSIS — G629 Polyneuropathy, unspecified: Secondary | ICD-10-CM | POA: Diagnosis not present

## 2018-11-20 DIAGNOSIS — I10 Essential (primary) hypertension: Secondary | ICD-10-CM | POA: Diagnosis not present

## 2018-11-20 DIAGNOSIS — R21 Rash and other nonspecific skin eruption: Secondary | ICD-10-CM | POA: Diagnosis not present

## 2018-11-20 DIAGNOSIS — Z87891 Personal history of nicotine dependence: Secondary | ICD-10-CM | POA: Diagnosis not present

## 2018-11-20 MED ORDER — PREDNISONE 5 MG PO TABS: ORAL_TABLET | ORAL | 0 refills | Status: DC

## 2018-11-20 MED ORDER — CLOTRIMAZOLE-BETAMETHASONE 1-0.05 % EX CREA: 1.0000 "application " | TOPICAL_CREAM | Freq: Two times a day (BID) | CUTANEOUS | 1 refills | Status: DC

## 2018-11-20 MED ORDER — GABAPENTIN 100 MG PO CAPS: 200.0000 mg | ORAL_CAPSULE | Freq: Every day | ORAL | 2 refills | Status: DC

## 2018-11-20 NOTE — Telephone Encounter (Signed)
Pt called to report that she woke up this morning with a rash from her wrist to elbow and numbness and tingling in both hands.  Pt is s/p 5 treatments with paclitaxel and and trastuzumab. Appt has been made for pt with [facility]

## 2018-11-20 NOTE — Patient Instructions (Signed)
COVID-19: How to Protect Yourself and Others Know how it spreads  There is currently no vaccine to prevent coronavirus disease 2019 (COVID-19).  The best way to prevent illness is to avoid being exposed to this virus.  The virus is thought to spread mainly from person-to-person. ? Between people who are in close contact with one another (within about 6 feet). ? Through respiratory droplets produced when an infected person coughs, sneezes or talks. ? These droplets can land in the mouths or noses of people who are nearby or possibly be inhaled into the lungs. ? Some recent studies have suggested that COVID-19 may be spread by people who are not showing symptoms. Everyone should Clean your hands often  Wash your hands often with soap and water for at least 20 seconds especially after you have been in a public place, or after blowing your nose, coughing, or sneezing.  If soap and water are not readily available, use a hand sanitizer that contains at least 60% alcohol. Cover all surfaces of your hands and rub them together until they feel dry.  Avoid touching your eyes, nose, and mouth with unwashed hands. Avoid close contact  Stay home if you are sick.  Avoid close contact with people who are sick.  Put distance between yourself and other people. ? Remember that some people without symptoms may be able to spread virus. ? This is especially important for people who are at higher risk of getting very sick.www.cdc.gov/coronavirus/2019-ncov/need-extra-precautions/people-at-higher-risk.html Cover your mouth and nose with a cloth face cover when around others  You could spread COVID-19 to others even if you do not feel sick.  Everyone should wear a cloth face cover when they have to go out in public, for example to the grocery store or to pick up other necessities. ? Cloth face coverings should not be placed on young children under age 2, anyone who has trouble breathing, or is unconscious,  incapacitated or otherwise unable to remove the mask without assistance.  The cloth face cover is meant to protect other people in case you are infected.  Do NOT use a facemask meant for a healthcare worker.  Continue to keep about 6 feet between yourself and others. The cloth face cover is not a substitute for social distancing. Cover coughs and sneezes  If you are in a private setting and do not have on your cloth face covering, remember to always cover your mouth and nose with a tissue when you cough or sneeze or use the inside of your elbow.  Throw used tissues in the trash.  Immediately wash your hands with soap and water for at least 20 seconds. If soap and water are not readily available, clean your hands with a hand sanitizer that contains at least 60% alcohol. Clean and disinfect  Clean AND disinfect frequently touched surfaces daily. This includes tables, doorknobs, light switches, countertops, handles, desks, phones, keyboards, toilets, faucets, and sinks. www.cdc.gov/coronavirus/2019-ncov/prevent-getting-sick/disinfecting-your-home.html  If surfaces are dirty, clean them: Use detergent or soap and water prior to disinfection.  Then, use a household disinfectant. You can see a list of EPA-registered household disinfectants here. cdc.gov/coronavirus 05/26/2018 This information is not intended to replace advice given to you by your health care provider. Make sure you discuss any questions you have with your health care provider. Document Released: 05/05/2018 Document Revised: 06/03/2018 Document Reviewed: 05/05/2018 Elsevier Patient Education  2020 [employer]  

## 2018-11-20 NOTE — Progress Notes (Signed)
[facility] Progress Note   Dorothy Frederick [id number] March 11, 1955 63 y.o.  Dorothy Frederick is managed by Dr. Lurline Del  Actively treated with chemotherapy/immunotherapy/hormonal therapy: yes  Current therapy: Paclitaxel and Ogiviri  Last treated: 11/19/2018 (cycle 2, day 1)  Next scheduled appointment with provider: 11/26/2018  Assessment: Plan:    Rash - Plan: clotrimazole-betamethasone (LOTRISONE) cream, predniSONE (DELTASONE) 5 MG tablet  Neuropathy - Plan: gabapentin (NEURONTIN) 100 MG capsule  Malignant neoplasm of upper-outer quadrant of left breast in female, estrogen receptor negative (HCC)   Rash: The patient was given prescription for Lotrisone cream to use twice daily and was given a prednisone taper which she will start tomorrow.  COPD with shortness of breath: The patient will start a 6-day prednisone taper tomorrow.  Neuropathy of the bilateral index fingers: The patient was started on gabapentin 200 mg p.o. nightly.  ER negative malignant neoplasm of the left breast: The patient continues to be followed by Dr. Lurline Del and is currently treated with Paclitaxel and Ogiviri which was last dosed on 11/19/2018.  She is scheduled to return for follow-up on 11/26/2018.  Please see After Visit Summary for patient specific instructions.  Future Appointments  Date Time Provider [place]  11/26/2018 12:15 PM [facility] ONLY [facility] None  11/26/2018 12:30 PM [facility] [place] FLUSH [facility] None  11/26/2018  1:30 PM [facility] INFUSION [facility] None  12/03/2018 12:15 PM [facility] LAB 4 [facility] None  12/03/2018 12:30 PM [facility] [place] FLUSH [facility] None  12/03/2018  1:00 PM Causey, Charlestine Massed, NP [facility] None  12/03/2018  1:30 PM [facility] INFUSION [facility] None  12/10/2018 12:15 PM [facility] LAB 1 [facility] None  12/10/2018 12:30 PM [facility] [place] FLUSH [facility] None  12/10/2018  1:30 PM [facility]  INFUSION [facility] None  12/16/2018 12:15 PM [facility] LAB 3 [facility] None  12/16/2018 12:30 PM [facility] [place] FLUSH [facility] None  12/16/2018  1:30 PM [facility] INFUSION [facility] None  12/24/2018 12:15 PM [facility] LAB 4 [facility] None  12/24/2018 12:30 PM [facility] [place] FLUSH [facility] None  12/24/2018  1:30 PM [facility] INFUSION [facility] None  12/31/2018 12:15 PM [facility] LAB 4 [facility] None  12/31/2018 12:30 PM [facility] [place] FLUSH [facility] None  12/31/2018  1:30 PM [facility] INFUSION [facility] None  01/07/2019 12:15 PM [facility] LAB 3 [facility] None  01/07/2019 12:30 PM [facility] [place] FLUSH [facility] None  01/07/2019  1:30 PM [facility] INFUSION [facility] None    No orders of the defined types were placed in this encounter.      Subjective:   Patient ID:  Dorothy Frederick is a 63 y.o. (DOB April 15, 1955) female.  Chief Complaint:  Chief Complaint  Patient presents with  . Rash    HPI Dorothy Frederick Is a 63 y.o. female with a diagnosis of an ER negative malignant neoplasm of the left breast. She is followed by Dr. Lurline Del and is currently treated with Paclitaxel and Ogiviri which was last dosed on 11/19/2018.  She presents to the clinic today with a report of a rash over her bilateral forearms and neck which is developed over the past 1 to 2 days.  She denies itching or pain.  She has had no change in personal hygiene products and denies sun exposure.  She has had no change in medications.  She also reports having some numbness and tingling in her hands that is getting worse with numbness concentrated mainly in her palmar distal index fingers.  She has a history of COPD and reports some increased shortness  of breath.  She denies chest pain, difficulty swallowing, fevers, chills, nausea, vomiting, diarrhea, or a cough.  Medications: I have reviewed the patient's current medications.  Allergies:  Allergies  Allergen Reactions  . Benadryl  [Diphenhydramine Hcl (Sleep)] Palpitations    Increased heartrate  . Sulfa Antibiotics Rash    Past Medical History:  Diagnosis Date  . Angina at rest [facility])   . Anxiety   . Cancer [facility]) 2000   left breast cancer-partial mastectomy  . COPD (chronic obstructive pulmonary disease) ([place])   . Depression   . Family history of breast cancer   . Family history of esophageal cancer   . Family history of lung cancer   . Family history of prostate cancer   . GERD (gastroesophageal reflux disease)   . Hyperlipidemia   . Hypertension   . Hypothyroidism   . Recurrent breast cancer, left [facility])     Past Surgical History:  Procedure Laterality Date  . ADENOIDECTOMY    . LEFT HEART CATHETERIZATION WITH CORONARY ANGIOGRAM N/A 10/21/2013   Procedure: LEFT HEART CATHETERIZATION WITH CORONARY ANGIOGRAM;  Surgeon: Burnell Blanks, MD;  Location: [facility] CATH LAB;  Service: Cardiovascular;  Laterality: N/A;  . MASTECTOMY W/ SENTINEL NODE BIOPSY Left 09/16/2018   Procedure: LEFT TOTAL MASTECTOMY WITH LEFT AXILLARY DEEP SENTINEL LYMPH NODE BIOPSY AND INJECT BLUE DYE;  Surgeon: Fanny Skates, MD;  Location: [place];  Service: General;  Laterality: Left;  Marland Kitchen MASTECTOMY, PARTIAL Left 2000  . PORTACATH PLACEMENT Right 09/16/2018   Procedure: INSERTION PORT-A-CATH;  Surgeon: Fanny Skates, MD;  Location: [place];  Service: General;  Laterality: Right;  . TONSILLECTOMY      Family History  Problem Relation Age of Onset  . Heart disease Mother   . Hypertension Father   . Cancer Father   . Prostate cancer Father   . Hypertension Sister   . Breast cancer Sister        dx 12s  . Heart disease Sister   . Hypertension Brother   . Hyperlipidemia Maternal Grandmother   . Stroke Maternal Grandmother   . Hypertension Brother   . Breast cancer Maternal Aunt   . Lung cancer Maternal Aunt   . Esophageal cancer Maternal Aunt   . Stomach cancer Paternal Grandmother      Social History   Socioeconomic History  . Marital status: Divorced    Spouse name: Not on file  . Number of children: Not on file  . Years of education: Not on file  . Highest education level: Not on file  Occupational History  . Not on file  Social Needs  . Financial resource strain: Not on file  . Food insecurity    Worry: Not on file    Inability: Not on file  . Transportation needs    Medical: Not on file    Non-medical: Not on file  Tobacco Use  . Smoking status: Former Smoker    Packs/day: 1.00    Years: 38.00    Pack years: 38.00    Types: Cigarettes  . Smokeless tobacco: Never Used  Substance and Sexual Activity  . Alcohol use: No  . Drug use: No  . Sexual activity: Not Currently    Birth control/protection: Post-menopausal  Lifestyle  . Physical activity    Days per week: Not on file    Minutes per session: Not on file  . Stress: Not on file  Relationships  . Social connections  Talks on phone: Not on file    Gets together: Not on file    Attends religious service: Not on file    Active member of club or organization: Not on file    Attends meetings of clubs or organizations: Not on file    Relationship status: Not on file  . Intimate partner violence    Fear of current or ex partner: Not on file    Emotionally abused: Not on file    Physically abused: Not on file    Forced sexual activity: Not on file  Other Topics Concern  . Not on file  Social History Narrative  . Not on file    Past Medical History, Surgical history, Social history, and Family history were reviewed and updated as appropriate.   Please see review of systems for further details on the patient's review from today.   Review of Systems:  Review of Systems  Constitutional: Negative for chills, diaphoresis and fever.  HENT: Negative for trouble swallowing.   Respiratory: Positive for shortness of breath. Negative for cough, choking, chest tightness, wheezing and stridor.    Cardiovascular: Negative for chest pain and palpitations.  Skin: Positive for rash.  Neurological: Positive for numbness.    Objective:   Physical Exam:  BP 135/76 (BP Location: Right Arm, Patient Position: Sitting)   Pulse 87   Temp 98.7 F (37.1 C) (Temporal)   Resp 18   Ht 5\' 4"  (1.626 m)   Wt 170 lb 9.6 oz (77.4 kg)   SpO2 97%   BMI 29.28 kg/m  ECOG: 1  Physical Exam Constitutional:      General: She is not in acute distress.    Appearance: She is not diaphoretic.  HENT:     Head: Normocephalic and atraumatic.     Right Ear: External ear normal.     Left Ear: External ear normal.     Mouth/Throat:     Pharynx: No oropharyngeal exudate.  Neck:     Musculoskeletal: Normal range of motion and neck supple.  Cardiovascular:     Rate and Rhythm: Normal rate and regular rhythm.     Heart sounds: Normal heart sounds. No murmur. No friction rub. No gallop.   Pulmonary:     Effort: Pulmonary effort is normal. No respiratory distress.     Breath sounds: Normal breath sounds. No wheezing or rales.  Lymphadenopathy:     Cervical: No cervical adenopathy.  Skin:    General: Skin is warm and dry.     Findings: Erythema and rash present.     Comments: A diffuse macular erythematous rash over the bilateral forearms.  No increased warmth or exudate is noted.  Neurological:     Mental Status: She is alert.     Coordination: Coordination normal.     Gait: Gait normal.     Comments: Decreased sensation in the distal palmar index fingers.  Psychiatric:        Behavior: Behavior normal.        Thought Content: Thought content normal.        Judgment: Judgment normal.     Lab Review:     Component Value Date/Time   NA 137 11/19/2018 1207   K 3.2 (L) 11/19/2018 1207   CL 105 11/19/2018 1207   CO2 22 11/19/2018 1207   GLUCOSE 104 (H) 11/19/2018 1207   BUN 11 11/19/2018 1207   CREATININE 0.89 11/19/2018 1207   CREATININE 1.11 (H) 07/22/2018 0835   CREATININE  0.97  10/18/2013 1527   CALCIUM 8.2 (L) 11/19/2018 1207   PROT 6.1 (L) 11/19/2018 1207   ALBUMIN 3.0 (L) 11/19/2018 1207   AST 18 11/19/2018 1207   AST 17 07/22/2018 0835   ALT 31 11/19/2018 1207   ALT 20 07/22/2018 0835   ALKPHOS 81 11/19/2018 1207   BILITOT 0.5 11/19/2018 1207   BILITOT 0.6 07/22/2018 0835   GFRNONAA >60 11/19/2018 1207   GFRNONAA 53 (L) 07/22/2018 0835   GFRAA >60 11/19/2018 1207   GFRAA >60 07/22/2018 0835       Component Value Date/Time   WBC 3.5 (L) 11/19/2018 1207   RBC 3.29 (L) 11/19/2018 1207   HGB 9.3 (L) 11/19/2018 1207   HGB 12.3 07/22/2018 0835   HGB 14.1 09/08/2006 1500   HCT 28.2 (L) 11/19/2018 1207   HCT 39.7 09/08/2006 1500   PLT 312 11/19/2018 1207   PLT 266 07/22/2018 0835   PLT 240 09/08/2006 1500   MCV 85.7 11/19/2018 1207   MCV 87.9 09/08/2006 1500   MCH 28.3 11/19/2018 1207   MCHC 33.0 11/19/2018 1207   RDW 13.6 11/19/2018 1207   RDW 12.0 09/08/2006 1500   LYMPHSABS 0.8 11/19/2018 1207   LYMPHSABS 1.8 09/08/2006 1500   MONOABS 0.5 11/19/2018 1207   MONOABS 0.5 09/08/2006 1500   EOSABS 0.1 11/19/2018 1207   EOSABS 0.1 09/08/2006 1500   BASOSABS 0.1 11/19/2018 1207   BASOSABS 0.1 09/08/2006 1500   -------------------------------  Imaging from last 24 hours (if applicable):  Radiology interpretation: No results found.

## 2018-11-23 ENCOUNTER — Encounter: Payer: Self-pay | Admitting: Adult Health

## 2018-11-26 ENCOUNTER — Inpatient Hospital Stay: Payer: BC Managed Care – PPO

## 2018-11-26 ENCOUNTER — Other Ambulatory Visit: Payer: Self-pay

## 2018-11-26 ENCOUNTER — Inpatient Hospital Stay: Payer: BC Managed Care – PPO | Attending: Oncology

## 2018-11-26 VITALS — BP 118/55 | HR 78 | Temp 98.9°F | Resp 18

## 2018-11-26 DIAGNOSIS — N184 Chronic kidney disease, stage 4 (severe): Secondary | ICD-10-CM | POA: Insufficient documentation

## 2018-11-26 DIAGNOSIS — E876 Hypokalemia: Secondary | ICD-10-CM | POA: Insufficient documentation

## 2018-11-26 DIAGNOSIS — I129 Hypertensive chronic kidney disease with stage 1 through stage 4 chronic kidney disease, or unspecified chronic kidney disease: Secondary | ICD-10-CM | POA: Insufficient documentation

## 2018-11-26 DIAGNOSIS — Z9012 Acquired absence of left breast and nipple: Secondary | ICD-10-CM | POA: Diagnosis not present

## 2018-11-26 DIAGNOSIS — C50412 Malignant neoplasm of upper-outer quadrant of left female breast: Secondary | ICD-10-CM

## 2018-11-26 DIAGNOSIS — Z95828 Presence of other vascular implants and grafts: Secondary | ICD-10-CM

## 2018-11-26 DIAGNOSIS — Z5111 Encounter for antineoplastic chemotherapy: Secondary | ICD-10-CM | POA: Diagnosis not present

## 2018-11-26 DIAGNOSIS — I158 Other secondary hypertension: Secondary | ICD-10-CM

## 2018-11-26 DIAGNOSIS — F17210 Nicotine dependence, cigarettes, uncomplicated: Secondary | ICD-10-CM | POA: Insufficient documentation

## 2018-11-26 DIAGNOSIS — Z79899 Other long term (current) drug therapy: Secondary | ICD-10-CM | POA: Diagnosis not present

## 2018-11-26 DIAGNOSIS — Z171 Estrogen receptor negative status [ER-]: Secondary | ICD-10-CM | POA: Diagnosis not present

## 2018-11-26 LAB — CBC WITH DIFFERENTIAL/PLATELET
Abs Immature Granulocytes: 0.57 10*3/uL — ABNORMAL HIGH (ref 0.00–0.07)
Basophils Absolute: 0.1 10*3/uL (ref 0.0–0.1)
Basophils Relative: 2 %
Eosinophils Absolute: 0.1 10*3/uL (ref 0.0–0.5)
Eosinophils Relative: 1 %
HCT: 29.4 % — ABNORMAL LOW (ref 36.0–46.0)
Hemoglobin: 9.5 g/dL — ABNORMAL LOW (ref 12.0–15.0)
Immature Granulocytes: 9 %
Lymphocytes Relative: 15 %
Lymphs Abs: 0.9 10*3/uL (ref 0.7–4.0)
MCH: 28.4 pg (ref 26.0–34.0)
MCHC: 32.3 g/dL (ref 30.0–36.0)
MCV: 88 fL (ref 80.0–100.0)
Monocytes Absolute: 0.5 10*3/uL (ref 0.1–1.0)
Monocytes Relative: 8 %
Neutro Abs: 4 10*3/uL (ref 1.7–7.7)
Neutrophils Relative %: 65 %
Platelets: 372 10*3/uL (ref 150–400)
RBC: 3.34 MIL/uL — ABNORMAL LOW (ref 3.87–5.11)
RDW: 14.5 % (ref 11.5–15.5)
WBC: 6.1 10*3/uL (ref 4.0–10.5)
nRBC: 0.5 % — ABNORMAL HIGH (ref 0.0–0.2)

## 2018-11-26 LAB — COMPREHENSIVE METABOLIC PANEL
ALT: 39 U/L (ref 0–44)
AST: 15 U/L (ref 15–41)
Albumin: 3.3 g/dL — ABNORMAL LOW (ref 3.5–5.0)
Alkaline Phosphatase: 70 U/L (ref 38–126)
Anion gap: 12 (ref 5–15)
BUN: 14 mg/dL (ref 8–23)
CO2: 20 mmol/L — ABNORMAL LOW (ref 22–32)
Calcium: 8.4 mg/dL — ABNORMAL LOW (ref 8.9–10.3)
Chloride: 108 mmol/L (ref 98–111)
Creatinine, Ser: 0.95 mg/dL (ref 0.44–1.00)
GFR calc Af Amer: >60 mL/min (ref >60)
GFR calc non Af Amer: >60 mL/min (ref >60)
Glucose, Bld: 95 mg/dL (ref 70–99)
Potassium: 3.5 mmol/L (ref 3.5–5.1)
Sodium: 140 mmol/L (ref 135–145)
Total Bilirubin: 0.5 mg/dL (ref 0.3–1.2)
Total Protein: 6.3 g/dL — ABNORMAL LOW (ref 6.5–8.1)

## 2018-11-26 MED ORDER — DEXAMETHASONE SODIUM PHOSPHATE 10 MG/ML IJ SOLN
INTRAMUSCULAR | Status: AC
  Filled 2018-11-26: qty 1

## 2018-11-26 MED ORDER — SODIUM CHLORIDE 0.9% FLUSH
10.0000 mL | INTRAVENOUS | Status: DC | PRN
  Administered 2018-11-26: 13:00:00 10 mL
  Filled 2018-11-26: qty 10

## 2018-11-26 MED ORDER — TRASTUZUMAB-DKST CHEMO 150 MG IV SOLR
150.0000 mg | Freq: Once | INTRAVENOUS | Status: AC
  Administered 2018-11-26: 150 mg via INTRAVENOUS
  Filled 2018-11-26: qty 7.14

## 2018-11-26 MED ORDER — ACETAMINOPHEN 325 MG PO TABS
ORAL_TABLET | ORAL | Status: AC
  Filled 2018-11-26: qty 2

## 2018-11-26 MED ORDER — SODIUM CHLORIDE 0.9 % IV SOLN
80.0000 mg/m2 | Freq: Once | INTRAVENOUS | Status: AC
  Administered 2018-11-26: 15:00:00 150 mg via INTRAVENOUS
  Filled 2018-11-26: qty 25

## 2018-11-26 MED ORDER — SODIUM CHLORIDE 0.9 % IV SOLN
Freq: Once | INTRAVENOUS | Status: AC
  Administered 2018-11-26: 14:00:00 via INTRAVENOUS
  Filled 2018-11-26: qty 250

## 2018-11-26 MED ORDER — DIPHENHYDRAMINE HCL 50 MG/ML IJ SOLN
INTRAMUSCULAR | Status: AC
  Filled 2018-11-26: qty 1

## 2018-11-26 MED ORDER — DIPHENHYDRAMINE HCL 50 MG/ML IJ SOLN
25.0000 mg | Freq: Once | INTRAMUSCULAR | Status: AC
  Administered 2018-11-26: 25 mg via INTRAVENOUS

## 2018-11-26 MED ORDER — FAMOTIDINE IN NACL 20-0.9 MG/50ML-% IV SOLN
20.0000 mg | Freq: Once | INTRAVENOUS | Status: AC
  Administered 2018-11-26: 20 mg via INTRAVENOUS

## 2018-11-26 MED ORDER — FAMOTIDINE IN NACL 20-0.9 MG/50ML-% IV SOLN
INTRAVENOUS | Status: AC
  Filled 2018-11-26: qty 50

## 2018-11-26 MED ORDER — ACETAMINOPHEN 325 MG PO TABS
650.0000 mg | ORAL_TABLET | Freq: Once | ORAL | Status: AC
  Administered 2018-11-26: 650 mg via ORAL

## 2018-11-26 MED ORDER — SODIUM CHLORIDE 0.9% FLUSH
10.0000 mL | INTRAVENOUS | Status: DC | PRN
  Administered 2018-11-26: 10 mL
  Filled 2018-11-26: qty 10

## 2018-11-26 MED ORDER — HEPARIN SOD (PORK) LOCK FLUSH 100 UNIT/ML IV SOLN
500.0000 [IU] | Freq: Once | INTRAVENOUS | Status: AC | PRN
  Administered 2018-11-26: 500 [IU]
  Filled 2018-11-26: qty 5

## 2018-11-26 MED ORDER — SODIUM CHLORIDE 0.9 % IV SOLN: 4.0000 mg | Freq: Once | INTRAVENOUS | Status: DC

## 2018-11-26 MED ORDER — DEXAMETHASONE SODIUM PHOSPHATE 10 MG/ML IJ SOLN
4.0000 mg | Freq: Once | INTRAMUSCULAR | Status: AC
  Administered 2018-11-26: 4 mg via INTRAVENOUS

## 2018-11-26 MED ORDER — DEXAMETHASONE SODIUM PHOSPHATE 10 MG/ML IJ SOLN: 4.0000 mg | Freq: Once | INTRAMUSCULAR | Status: AC

## 2018-11-26 NOTE — Patient Instructions (Signed)
[facility] Discharge Instructions for Patients Receiving Chemotherapy  Today you received the following chemotherapy agents Trastuzumab (OGIVRI) & Paclitaxel (TAXOL).  To help prevent nausea and vomiting after your treatment, we encourage you to take your nausea medication as prescribed.   If you develop nausea and vomiting that is not controlled by your nausea medication, call the clinic.   BELOW ARE SYMPTOMS THAT SHOULD BE REPORTED IMMEDIATELY:  *FEVER GREATER THAN 100.5 F  *CHILLS WITH OR WITHOUT FEVER  NAUSEA AND VOMITING THAT IS NOT CONTROLLED WITH YOUR NAUSEA MEDICATION  *UNUSUAL SHORTNESS OF BREATH  *UNUSUAL BRUISING OR BLEEDING  TENDERNESS IN MOUTH AND THROAT WITH OR WITHOUT PRESENCE OF ULCERS  *URINARY PROBLEMS  *BOWEL PROBLEMS  UNUSUAL RASH Items with * indicate a potential emergency and should be followed up as soon as possible.  Feel free to call the clinic should you have any questions or concerns. The clinic phone number is [phone].  Please show the [address] at check-in to the Emergency Department and triage nurse.  Coronavirus (COVID-19) Are you at risk?  Are you at risk for the Coronavirus (COVID-19)?  To be considered HIGH RISK for Coronavirus (COVID-19), you have to meet the following criteria:  . Traveled to [place], [place], [place], [place] or [place]; or in the [place] to [place], [place], [place], or [place]; and have fever, cough, and shortness of breath within the last 2 weeks of travel OR . Been in close contact with a person diagnosed with COVID-19 within the last 2 weeks and have fever, cough, and shortness of breath . IF YOU DO NOT MEET THESE CRITERIA, YOU ARE CONSIDERED LOW RISK FOR COVID-19.  What to do if you are HIGH RISK for COVID-19?  Marland Kitchen If you are having a medical emergency, call 911. . Seek medical care right away. Before you go to a doctor's office, urgent care or emergency department, call  ahead and tell them about your recent travel, contact with someone diagnosed with COVID-19, and your symptoms. You should receive instructions from your physician's office regarding next steps of care.  . When you arrive at healthcare provider, tell the healthcare staff immediately you have returned from visiting [place], [place], [place], [place] or [place]; or traveled in the [place] to [place], [place], [place], or [place]; in the last two weeks or you have been in close contact with a person diagnosed with COVID-19 in the last 2 weeks.   . Tell the health care staff about your symptoms: fever, cough and shortness of breath. . After you have been seen by a medical provider, you will be either: o Tested for (COVID-19) and discharged home on quarantine except to seek medical care if symptoms worsen, and asked to  - Stay home and avoid contact with others until you get your results (4-5 days)  - Avoid travel on public transportation if possible (such as bus, train, or airplane) or o Sent to the Emergency Department by EMS for evaluation, COVID-19 testing, and possible admission depending on your condition and test results.  What to do if you are LOW RISK for COVID-19?  Reduce your risk of any infection by using the same precautions used for avoiding the common cold or flu:  Marland Kitchen Wash your hands often with soap and warm water for at least 20 seconds.  If soap and water are not readily available, use an alcohol-based hand sanitizer with at least 60% alcohol.  Marland Kitchen  If coughing or sneezing, cover your mouth and nose by coughing or sneezing into the elbow areas of your shirt or coat, into a tissue or into your sleeve (not your hands). . Avoid shaking hands with others and consider head nods or verbal greetings only. . Avoid touching your eyes, nose, or mouth with unwashed hands.  . Avoid close contact with people who are sick. . Avoid places or events with large numbers of people in one location,  like concerts or sporting events. . Carefully consider travel plans you have or are making. . If you are planning any travel outside or inside the [place], visit the CDC's Travelers' Health webpage for the latest health notices. . If you have some symptoms but not all symptoms, continue to monitor at home and seek medical attention if your symptoms worsen. . If you are having a medical emergency, call 911.   ADDITIONAL HEALTHCARE OPTIONS FOR PATIENTS  [facility] / e-Visit: [email or web address]         [facility]: [phone]  [facility]: [phone]                   [facility]: [phone]    

## 2018-12-03 ENCOUNTER — Ambulatory Visit (HOSPITAL_COMMUNITY)
Admission: RE | Admit: 2018-12-03 | Discharge: 2018-12-03 | Disposition: A | Discharge status: Home / Self Care | Payer: BC Managed Care – PPO | Source: Ambulatory Visit | Attending: Adult Health | Admitting: Adult Health

## 2018-12-03 ENCOUNTER — Other Ambulatory Visit: Payer: Self-pay | Admitting: Adult Health

## 2018-12-03 ENCOUNTER — Inpatient Hospital Stay: Payer: BC Managed Care – PPO

## 2018-12-03 ENCOUNTER — Telehealth: Payer: Self-pay

## 2018-12-03 ENCOUNTER — Other Ambulatory Visit: Payer: Self-pay

## 2018-12-03 ENCOUNTER — Encounter: Payer: Self-pay | Admitting: Adult Health

## 2018-12-03 ENCOUNTER — Inpatient Hospital Stay (HOSPITAL_BASED_OUTPATIENT_CLINIC_OR_DEPARTMENT_OTHER): Payer: BC Managed Care – PPO | Admitting: Adult Health

## 2018-12-03 VITALS — BP 113/56 | HR 109 | Temp 98.9°F | Resp 18 | Ht 64.0 in | Wt 166.8 lb

## 2018-12-03 DIAGNOSIS — F17210 Nicotine dependence, cigarettes, uncomplicated: Secondary | ICD-10-CM | POA: Diagnosis not present

## 2018-12-03 DIAGNOSIS — C50412 Malignant neoplasm of upper-outer quadrant of left female breast: Secondary | ICD-10-CM | POA: Insufficient documentation

## 2018-12-03 DIAGNOSIS — Z171 Estrogen receptor negative status [ER-]: Secondary | ICD-10-CM | POA: Diagnosis not present

## 2018-12-03 DIAGNOSIS — Z79899 Other long term (current) drug therapy: Secondary | ICD-10-CM | POA: Diagnosis not present

## 2018-12-03 DIAGNOSIS — Z5111 Encounter for antineoplastic chemotherapy: Secondary | ICD-10-CM | POA: Diagnosis not present

## 2018-12-03 DIAGNOSIS — I158 Other secondary hypertension: Secondary | ICD-10-CM

## 2018-12-03 DIAGNOSIS — Z20822 Contact with and (suspected) exposure to covid-19: Secondary | ICD-10-CM

## 2018-12-03 DIAGNOSIS — R0602 Shortness of breath: Secondary | ICD-10-CM | POA: Diagnosis not present

## 2018-12-03 DIAGNOSIS — Z95828 Presence of other vascular implants and grafts: Secondary | ICD-10-CM

## 2018-12-03 DIAGNOSIS — Z20828 Contact with and (suspected) exposure to other viral communicable diseases: Secondary | ICD-10-CM

## 2018-12-03 DIAGNOSIS — E876 Hypokalemia: Secondary | ICD-10-CM | POA: Diagnosis not present

## 2018-12-03 DIAGNOSIS — N184 Chronic kidney disease, stage 4 (severe): Secondary | ICD-10-CM | POA: Diagnosis not present

## 2018-12-03 DIAGNOSIS — Z9012 Acquired absence of left breast and nipple: Secondary | ICD-10-CM | POA: Diagnosis not present

## 2018-12-03 DIAGNOSIS — I129 Hypertensive chronic kidney disease with stage 1 through stage 4 chronic kidney disease, or unspecified chronic kidney disease: Secondary | ICD-10-CM | POA: Diagnosis not present

## 2018-12-03 LAB — CBC WITH DIFFERENTIAL/PLATELET
Abs Immature Granulocytes: 0.32 10*3/uL — ABNORMAL HIGH (ref 0.00–0.07)
Basophils Absolute: 0 10*3/uL (ref 0.0–0.1)
Basophils Relative: 1 %
Eosinophils Absolute: 0 10*3/uL (ref 0.0–0.5)
Eosinophils Relative: 1 %
HCT: 26.3 % — ABNORMAL LOW (ref 36.0–46.0)
Hemoglobin: 8.6 g/dL — ABNORMAL LOW (ref 12.0–15.0)
Immature Granulocytes: 5 %
Lymphocytes Relative: 9 %
Lymphs Abs: 0.6 10*3/uL — ABNORMAL LOW (ref 0.7–4.0)
MCH: 28.6 pg (ref 26.0–34.0)
MCHC: 32.7 g/dL (ref 30.0–36.0)
MCV: 87.4 fL (ref 80.0–100.0)
Monocytes Absolute: 1 10*3/uL (ref 0.1–1.0)
Monocytes Relative: 16 %
Neutro Abs: 4.1 10*3/uL (ref 1.7–7.7)
Neutrophils Relative %: 68 %
Platelets: 329 10*3/uL (ref 150–400)
RBC: 3.01 MIL/uL — ABNORMAL LOW (ref 3.87–5.11)
RDW: 14.9 % (ref 11.5–15.5)
WBC: 6 10*3/uL (ref 4.0–10.5)
nRBC: 0 % (ref 0.0–0.2)

## 2018-12-03 LAB — COMPREHENSIVE METABOLIC PANEL
ALT: 34 U/L (ref 0–44)
AST: 22 U/L (ref 15–41)
Albumin: 2.8 g/dL — ABNORMAL LOW (ref 3.5–5.0)
Alkaline Phosphatase: 89 U/L (ref 38–126)
Anion gap: 12 (ref 5–15)
BUN: 8 mg/dL (ref 8–23)
CO2: 22 mmol/L (ref 22–32)
Calcium: 8 mg/dL — ABNORMAL LOW (ref 8.9–10.3)
Chloride: 101 mmol/L (ref 98–111)
Creatinine, Ser: 1.04 mg/dL — ABNORMAL HIGH (ref 0.44–1.00)
GFR calc Af Amer: >60 mL/min (ref >60)
GFR calc non Af Amer: 57 mL/min — ABNORMAL LOW (ref >60)
Glucose, Bld: 104 mg/dL — ABNORMAL HIGH (ref 70–99)
Potassium: 3 mmol/L — CL (ref 3.5–5.1)
Sodium: 135 mmol/L (ref 135–145)
Total Bilirubin: 0.9 mg/dL (ref 0.3–1.2)
Total Protein: 6.2 g/dL — ABNORMAL LOW (ref 6.5–8.1)

## 2018-12-03 MED ORDER — SODIUM CHLORIDE 0.9% FLUSH
10.0000 mL | INTRAVENOUS | Status: DC | PRN
  Administered 2018-12-03: 10 mL
  Filled 2018-12-03: qty 10

## 2018-12-03 NOTE — Progress Notes (Signed)
[place]  [phone] [phone]    ID: Dorothy Frederick DOB: April 27, 1955  MR#: [id number]  LSL#:[id number]  Patient Care Team: Dorothy Bien, MD as PCP - General (Family Medicine) Dorothy Latch, MD as PCP - Cardiology (Cardiology) Dorothy Kaufmann, RN as Oncology Nurse Navigator Dorothy Germany, RN as Oncology Nurse Navigator Dorothy Skates, MD as Consulting Physician (General Surgery) Frederick, Dorothy Dad, MD as Consulting Physician (Oncology) Dorothy Pray, MD as Consulting Physician (Radiation Oncology) Frederick, Dorothy Gobble, MD as Consulting Physician (Cardiology) OTHER MD:   CHIEF COMPLAINT: Estrogen receptor negative breast cancer  CURRENT TREATMENT: Adjuvant chemotherapy and anti-HER-2 immunotherapy   INTERVAL HISTORY: Dorothy Frederick returns today for follow-up and treatment of her estrogen receptor negative breast cancer.   She began adjuvant chemotherapy consisting of paclitaxel and trastuzumab two weeks ago.  She is due for week #7 of treatment.  REVIEW OF SYSTEMS: Dorothy Frederick does not feel well today.  She is in a wheelchair.  She says that she is having worsening shortness of breath on exertion.  She notes that she and her daughter and granddaughter have been sick with nasal congestion.  She says that it will get better and then worse.  She has a h/o copd and notes that she always is short of breath, but now it is worse than it usually is.  She has had some low grade fevers.  She denies a cough.  She has good sense of taste and smell.  Dorothy Frederick denies any nausea, vomiting, bladder issues, headaches, chest pain, palpitations.  A detailed ROS was otherwise non contributory today.     HISTORY OF CURRENT ILLNESS: From the original intake note:  Dorothy Frederick has a history of left breast cancer diagnosed in 2000, and treated with a partial mastectomy.  She has been followed by Dr. Matthew Frederick in [facility]. She was also treated with Tamoxifen for 1.5 years,  which she tolerated well, but had to discontinue due to financial difficulties. She did not receive chemotherapy. She notes that her cancer was lymph node negative.  More recently, she had routine screening mammography on 07/06/2018 showing: Breast Density Category B. On mammography, there is a 1.5 cm oval mass with associated calcifications in the left breast upper outer quadrant posterior depth 6.6 cm from the nipple. This mass is approximately 3 cm anterior to the lumpectomy site in the upper outer posterior left breast. No other significant masses, calcifications, or other findings are seen in either breast.   Accordingly on 07/13/2018 she proceeded to biopsy of the left breast area in question. The pathology from this procedure showed ([id number]): invasive ductal carcinoma, grade III, 2 o'clock, 5 cm from the nipple. Prognostic indicators significant for: estrogen receptor, 0% negative and progesterone receptor, 0% negative. Proliferation marker Ki67 at 70%. HER2 positive (3+) by immunohistochemistry.  The patient's subsequent history is as detailed below.   PAST MEDICAL HISTORY: Past Medical History:  Diagnosis Date  . Angina at rest [facility])   . Anxiety   . Cancer [facility]) 2000   left breast cancer-partial mastectomy  . COPD (chronic obstructive pulmonary disease) ([place])   . Depression   . Family history of breast cancer   . Family history of esophageal cancer   . Family history of lung cancer   . Family history of prostate cancer   . GERD (gastroesophageal reflux disease)   . Hyperlipidemia   . Hypertension   . Hypothyroidism   . Recurrent breast cancer, left ([place])  PAST SURGICAL HISTORY: Past Surgical History:  Procedure Laterality Date  . ADENOIDECTOMY    . LEFT HEART CATHETERIZATION WITH CORONARY ANGIOGRAM N/A 10/21/2013   Procedure: LEFT HEART CATHETERIZATION WITH CORONARY ANGIOGRAM;  Surgeon: Dorothy Blanks, MD;  Location[facility] C[facility] LAB;  Service: Cardiovascular;   Laterality: N/A;  . MASTECTOMY W/ SENTINEL NODE BIOPSY Left 09/16/2018   Procedure: LEFT TOTAL MASTECTOMY WITH LEFT AXILLARY DEEP SENTINEL LYMPH NODE BIOPSY AND INJECT BLUE DYE;  Surgeon: Dorothy Skates, MD;  Location: [place];  Service: General;  Laterality: Left;  Marland Kitchen MASTECTOMY, PARTIAL Left 2000  . PORTACATH PLACEMENT Right 09/16/2018   Procedure: INSERTION PORT-A-CATH;  Surgeon: Dorothy Skates, MD;  Location: [place];  Service: General;  Laterality: Right;  . TONSILLECTOMY       FAMILY HISTORY: Family History  Problem Relation Age of Onset  . Heart disease Mother   . Hypertension Father   . Cancer Father   . Prostate cancer Father   . Hypertension Sister   . Breast cancer Sister        dx 67s  . Heart disease Sister   . Hypertension Brother   . Hyperlipidemia Maternal Grandmother   . Stroke Maternal Grandmother   . Hypertension Brother   . Breast cancer Maternal Aunt   . Lung cancer Maternal Aunt   . Esophageal cancer Maternal Aunt   . Stomach cancer Paternal Grandmother    Dorothy Frederick's father died from prostate cancer at age 38. Patients' mother died from heart complications at age 61. The patient has 2 half brothers and 2 half sisters. Patient denies anyone in her family having ovarian, prostate, or pancreatic cancer. 1 maternal aunt and 28 half aunt (half-sister of the patient's mother) had breast cancer breast cancer.    GYNECOLOGIC HISTORY:  No LMP recorded. Patient is postmenopausal. 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 She is divorced. She lives with her daughter and two grandchildren. Her daughter, Dorothy Frederick, is 48 and works for [employer] as a Clinical research associate.  Dorothy Frederick's children Dorothy Frederick, 13, and Dorothy Frederick, 10 also live with  the patient. Dorothy Frederick's other daughter, Dorothy Frederick, is a Licensed conveyancer at [employer]. Dorothy Frederick has 4 grandchildren in total.   ADVANCED DIRECTIVES:  Not in place. Dorothy Frederick was given the appropriate forms on 07/22/18 to fill out and return at their own discretion.     HEALTH MAINTENANCE: Social History   Tobacco Use  . Smoking status: Former Smoker    Packs/day: 1.00    Years: 38.00    Pack years: 38.00    Types: Cigarettes  . Smokeless tobacco: Never Used  Substance Use Topics  . Alcohol use: No  . Drug use: No    Colonoscopy: 2017  PAP:    Bone density: yes, [facility]; osteoporosis Mammography: up to date  Allergies  Allergen Reactions  . Benadryl [Diphenhydramine Hcl (Sleep)] Palpitations    Increased heartrate  . Sulfa Antibiotics Rash    Current Outpatient Medications  Medication Sig Dispense Refill  . albuterol (PROVENTIL HFA;VENTOLIN HFA) 108 (90 BASE) MCG/ACT inhaler Inhale 2 puffs into the lungs every 6 (six) hours as needed for wheezing or shortness of breath.    Marland Kitchen alendronate (FOSAMAX) 70 MG tablet Take 1 tablet by mouth once a week.    Marland Kitchen  atorvastatin (LIPITOR) 10 MG tablet Take 40 mg by mouth daily.     . Cholecalciferol 1000 UNITS capsule Take 500 Units by mouth daily.     . cholestyramine (QUESTRAN) 4 g packet Take 1 packet (4 g total) by mouth 2 (two) times daily. 60 each 1  . clotrimazole-betamethasone (LOTRISONE) cream Apply 1 application topically 2 (two) times daily. 60 g 1  . escitalopram (LEXAPRO) 10 MG tablet Take 10 mg by mouth daily.  3  . famotidine (PEPCID) 40 MG tablet Take 40 mg by mouth daily.    . Fluticasone-Umeclidin-Vilant (TRELEGY ELLIPTA) 100-62.5-25 MCG/INH AEPB Inhale 1 puff into the lungs daily as needed.    . gabapentin (NEURONTIN) 100 MG capsule Take 2 capsules (200 mg total) by mouth at bedtime. 60 capsule 2  . levothyroxine (SYNTHROID, LEVOTHROID) 50 MCG tablet Take 1 tablet by mouth daily.    Marland Kitchen lidocaine-prilocaine (EMLA) cream Apply to affected area once 30  g 3  . LORazepam (ATIVAN) 0.5 MG tablet Take 1 tablet (0.5 mg total) by mouth at bedtime as needed (Nausea or vomiting). 30 tablet 0  . olmesartan (BENICAR) 20 MG tablet Take 20 mg by mouth daily.  11  . ondansetron (ZOFRAN) 8 MG tablet Take 1 tablet (8 mg total) by mouth 2 (two) times daily as needed (Nausea or vomiting). 30 tablet 1  . potassium chloride (KLOR-CON) 10 MEQ tablet Take 1 tablet (10 mEq total) by mouth daily. 30 tablet 0  . predniSONE (DELTASONE) 5 MG tablet 6 tab x 1 day, 5 tab x 1 day, 4 tab x 1 day, 3 tab x 1 day, 2 tab x 1 day, 1 tab x 1 day, stop 21 tablet 0   No current facility-administered medications for this visit.      OBJECTIVE:   Vitals:   12/03/18 1245  BP: (!) 113/56  Pulse: (!) 109  Resp: 18  Temp: 98.9 F (37.2 C)  SpO2: 92%   Wt Readings from Last 3 Encounters:  12/03/18 166 lb 12.8 oz (75.7 kg)  11/20/18 170 lb 9.6 oz (77.4 kg)  11/19/18 169 lb 6.4 oz (76.8 kg)   Body mass index is 28.63 kg/m.    ECOG FS:1 - Symptomatic but completely ambulatory GENERAL: Patient is a well appearing female in no acute distress HEENT:  Sclerae anicteric.  Oropharynx clear and moist. No ulcerations or evidence of oropharyngeal candidiasis. Neck is supple.  NODES:  No cervical, supraclavicular, or axillary lymphadenopathy palpated.  BREAST EXAM:  S/p left mastectomy, healing well, no erythema, or swelling present at this time. LUNGS:  Clear to auscultation bilaterally.  No wheezes or rhonchi. HEART:  Regular rate and rhythm.  HR Recheck was 98. No murmur appreciated. ABDOMEN:  Soft, nontender.  Positive, normoactive bowel sounds. No organomegaly palpated. MSK:  No focal spinal tenderness to palpation. Full range of motion bilaterally in the upper extremities. EXTREMITIES:  No peripheral edema.   SKIN:  Clear with no obvious rashes or skin changes. No nail dyscrasia. NEURO:  Nonfocal. Well oriented.  Appropriate affect.     LAB RESULTS:  CMP      Component Value Date/Time   NA 140 11/26/2018 1258   K 3.5 11/26/2018 1258   CL 108 11/26/2018 1258   CO2 20 (L) 11/26/2018 1258   GLUCOSE 95 11/26/2018 1258   BUN 14 11/26/2018 1258   CREATININE 0.95 11/26/2018 1258   CREATININE 1.11 (H) 07/22/2018 0835   CREATININE 0.97 10/18/2013 1527   CALCIUM  8.4 (L) 11/26/2018 1258   PROT 6.3 (L) 11/26/2018 1258   ALBUMIN 3.3 (L) 11/26/2018 1258   AST 15 11/26/2018 1258   AST 17 07/22/2018 0835   ALT 39 11/26/2018 1258   ALT 20 07/22/2018 0835   ALKPHOS 70 11/26/2018 1258   BILITOT 0.5 11/26/2018 1258   BILITOT 0.6 07/22/2018 0835   GFRNONAA >60 11/26/2018 1258   GFRNONAA 53 (L) 07/22/2018 0835   GFRAA >60 11/26/2018 1258   GFRAA >60 07/22/2018 0835    No results found for: TOTALPROTELP, ALBUMINELP, A1GS, A2GS, BETS, BETA2SER, GAMS, MSPIKE, SPEI  No results found for: KPAFRELGTCHN, LAMBDASER, KAPLAMBRATIO  Lab Results  Component Value Date   WBC 6.0 12/03/2018   NEUTROABS 4.1 12/03/2018   HGB 8.6 (L) 12/03/2018   HCT 26.3 (L) 12/03/2018   MCV 87.4 12/03/2018   PLT 329 12/03/2018    '@LASTCHEMISTRY' @  Lab Results  Component Value Date   LABCA2 24 09/08/2006    No components found for: [id number]  No results for input(s): INR in the last 168 hours.  Lab Results  Component Value Date   LABCA2 24 09/08/2006    No results found for: YHC623  No results found for: JSE831  No results found for: DVV616  No results found for: CA2729  No components found for: HGQUANT  No results found for: CEA1 / No results found for: CEA1   No results found for: AFPTUMOR  No results found for: [place]  No results found for: PSA1  Appointment on 12/03/2018  Component Date Value Ref Range Status  . WBC 12/03/2018 6.0  4.0 - 10.5 K/uL Final  . RBC 12/03/2018 3.01* 3.87 - 5.11 MIL/uL Final  . Hemoglobin 12/03/2018 8.6* 12.0 - 15.0 g/dL Final  . HCT 12/03/2018 26.3* 36.0 - 46.0 % Final  . MCV 12/03/2018 87.4  80.0 - 100.0 fL  Final  . MCH 12/03/2018 28.6  26.0 - 34.0 pg Final  . MCHC 12/03/2018 32.7  30.0 - 36.0 g/dL Final  . RDW 12/03/2018 14.9  11.5 - 15.5 % Final  . Platelets 12/03/2018 329  150 - 400 K/uL Final  . nRBC 12/03/2018 0.0  0.0 - 0.2 % Final  . Neutrophils Relative % 12/03/2018 68  % Final  . Neutro Abs 12/03/2018 4.1  1.7 - 7.7 K/uL Final  . Lymphocytes Relative 12/03/2018 9  % Final  . Lymphs Abs 12/03/2018 0.6* 0.7 - 4.0 K/uL Final  . Monocytes Relative 12/03/2018 16  % Final  . Monocytes Absolute 12/03/2018 1.0  0.1 - 1.0 K/uL Final  . Eosinophils Relative 12/03/2018 1  % Final  . Eosinophils Absolute 12/03/2018 0.0  0.0 - 0.5 K/uL Final  . Basophils Relative 12/03/2018 1  % Final  . Basophils Absolute 12/03/2018 0.0  0.0 - 0.1 K/uL Final  . Immature Granulocytes 12/03/2018 5  % Final  . Abs Immature Granulocytes 12/03/2018 0.32* 0.00 - 0.07 K/uL Final   Performed at [facility], [place] [address], [place], [place] [address]    (this displays the last labs from the last 3 days)  No results found for: TOTALPROTELP, ALBUMINELP, A1GS, A2GS, BETS, BETA2SER, GAMS, MSPIKE, SPEI (this displays SPEP labs)  No results found for: KPAFRELGTCHN, LAMBDASER, KAPLAMBRATIO (kappa/lambda light chains)  No results found for: HGBA, HGBA2QUANT, HGBFQUANT, HGBSQUAN (Hemoglobinopathy evaluation)   No results found for: LDH  No results found for: IRON, TIBC, IRONPCTSAT (Iron and TIBC)  No results found for: FERRITIN  Urinalysis No results  found for: COLORURINE, APPEARANCEUR, LABSPEC, PHURINE, GLUCOSEU, HGBUR, BILIRUBINUR, KETONESUR, PROTEINUR, UROBILINOGEN, NITRITE, LEUKOCYTESUR   STUDIES:  No results found.   ELIGIBLE FOR AVAILABLE RESEARCH PROTOCOL: no   ASSESSMENT: 63 y.o. [place], [place] woman status post left breast upper outer quadrant biopsy 07/13/2018 for a clinical T1c N0, stage IA invasive ductal carcinoma, grade 3, estrogen and progesterone receptor negative,  HER-2 amplified, with an MIB-1 of 70%  (1) status post left mastectomy with sentinel lymph node sampling 09/16/2018 for a pT2, pN0, stage IIA invasive ductal carcinoma, grade 3, with negative margins  (a) a total of 6 sentinel lymph nodes were removed  (b) the patient is not planning on reconstruction  (2) adjuvant chemotherapy with paclitaxel and trastuzumab weekly x12 started on 10/21/2018  (3) trastuzumab to be continued every 21 days to total 1 year  (a) echo 07/27/2018 shows an ejection fraction in the 55-60% range  (b) Next echo scheduled for mid December 2020  (4) no indication for postmastectomy radiation  (5) genetic testing on 07/22/2018 through the [facility] Common Hereditary cancer panel found no pathogenic mutations.in APC, ATM, AXIN2, BARD1, BMPR1A, BRCA1, BRCA2, BRIP1, CDH1, CDKN2A (p14ARF), CDKN2A (p16INK4a), CKD4, CHEK2, CTNNA1, DICER1, EPCAM (Deletion/duplication testing only), GREM1 (promoter region deletion/duplication testing only), KIT, MEN1, MLH1, MSH2, MSH3, MSH6, MUTYH, NBN, NF1, NHTL1, PALB2, PDGFRA, PMS2, POLD1, POLE, PTEN, RAD50, RAD51C, RAD51D, SDHB, SDHC, SDHD, SMAD4, SMARCA4. STK11, TP53, TSC1, TSC2, and VHL.  The following genes were evaluated for sequence changes only: SDHA and HOXB13 c.251G>A variant only.   PLAN: Kiona is feeling more short of breath.  She has accompanying nasal symptoms, however her chest xray was negative, which is reassuring.  She was recommended to take the week off of her chemotherapy treatment to allow for her to regain her strength.  She was also recommended to undergo COVID testing to ensure this isn't going on either.  I talked to her about her shortness of breath.  If it worsens in any way, she was recommended to call us, and she knows to go to the ER should it become severe or should she have any other life threatening issues.  Lalaine has hypokalemia.  She was recommended to restart her potassium prescription.  She will continue on the  Questran for the diarrhea, and was recommended to increase her fluid intake.    Neidy will return next week for labs, f/u, and her next treatment.  She was recommended to continue with the appropriate pandemic precautions. She knows to call for any questions that may arise between now and her next appointment.  We are happy to see her sooner if needed.  The above was reviewed with Dr. Jana Hakim in detail and he is in agreement with the above plan.  A total of (30) minutes of face-to-face time was spent with this patient with greater than 50% of that time in counseling and care-coordination.   Wilber Bihari  12/03/18 1:02 PM Medical Oncology and Hematology [facility] [place], [place] [address] Tel. [phone]    Fax. [phone]

## 2018-12-03 NOTE — Telephone Encounter (Signed)
Lab called with critical potassium of 3.0. Wilber Bihari, NP made aware of result. No further instructions received at this time.

## 2018-12-10 ENCOUNTER — Other Ambulatory Visit: Payer: Self-pay

## 2018-12-10 ENCOUNTER — Inpatient Hospital Stay: Payer: BC Managed Care – PPO

## 2018-12-10 VITALS — BP 102/58 | HR 85 | Temp 98.7°F | Resp 17 | Ht 64.0 in | Wt 166.0 lb

## 2018-12-10 DIAGNOSIS — Z79899 Other long term (current) drug therapy: Secondary | ICD-10-CM | POA: Diagnosis not present

## 2018-12-10 DIAGNOSIS — F17210 Nicotine dependence, cigarettes, uncomplicated: Secondary | ICD-10-CM | POA: Diagnosis not present

## 2018-12-10 DIAGNOSIS — Z171 Estrogen receptor negative status [ER-]: Secondary | ICD-10-CM | POA: Diagnosis not present

## 2018-12-10 DIAGNOSIS — C50412 Malignant neoplasm of upper-outer quadrant of left female breast: Secondary | ICD-10-CM | POA: Diagnosis not present

## 2018-12-10 DIAGNOSIS — Z5111 Encounter for antineoplastic chemotherapy: Secondary | ICD-10-CM | POA: Diagnosis not present

## 2018-12-10 DIAGNOSIS — I129 Hypertensive chronic kidney disease with stage 1 through stage 4 chronic kidney disease, or unspecified chronic kidney disease: Secondary | ICD-10-CM | POA: Diagnosis not present

## 2018-12-10 DIAGNOSIS — E876 Hypokalemia: Secondary | ICD-10-CM | POA: Diagnosis not present

## 2018-12-10 DIAGNOSIS — Z9012 Acquired absence of left breast and nipple: Secondary | ICD-10-CM | POA: Diagnosis not present

## 2018-12-10 DIAGNOSIS — I158 Other secondary hypertension: Secondary | ICD-10-CM

## 2018-12-10 DIAGNOSIS — N184 Chronic kidney disease, stage 4 (severe): Secondary | ICD-10-CM | POA: Diagnosis not present

## 2018-12-10 LAB — CBC WITH DIFFERENTIAL/PLATELET
Abs Immature Granulocytes: 0.87 10*3/uL — ABNORMAL HIGH (ref 0.00–0.07)
Basophils Absolute: 0.1 10*3/uL (ref 0.0–0.1)
Basophils Relative: 1 %
Eosinophils Absolute: 0.2 10*3/uL (ref 0.0–0.5)
Eosinophils Relative: 2 %
HCT: 26.8 % — ABNORMAL LOW (ref 36.0–46.0)
Hemoglobin: 8.4 g/dL — ABNORMAL LOW (ref 12.0–15.0)
Immature Granulocytes: 10 %
Lymphocytes Relative: 13 %
Lymphs Abs: 1.1 10*3/uL (ref 0.7–4.0)
MCH: 27.5 pg (ref 26.0–34.0)
MCHC: 31.3 g/dL (ref 30.0–36.0)
MCV: 87.9 fL (ref 80.0–100.0)
Monocytes Absolute: 0.5 10*3/uL (ref 0.1–1.0)
Monocytes Relative: 6 %
Neutro Abs: 5.9 10*3/uL (ref 1.7–7.7)
Neutrophils Relative %: 68 %
Platelets: 457 10*3/uL — ABNORMAL HIGH (ref 150–400)
RBC: 3.05 MIL/uL — ABNORMAL LOW (ref 3.87–5.11)
RDW: 16.1 % — ABNORMAL HIGH (ref 11.5–15.5)
WBC: 8.7 10*3/uL (ref 4.0–10.5)
nRBC: 0 % (ref 0.0–0.2)

## 2018-12-10 LAB — COMPREHENSIVE METABOLIC PANEL
ALT: 29 U/L (ref 0–44)
AST: 24 U/L (ref 15–41)
Albumin: 2.7 g/dL — ABNORMAL LOW (ref 3.5–5.0)
Alkaline Phosphatase: 90 U/L (ref 38–126)
Anion gap: 12 (ref 5–15)
BUN: 13 mg/dL (ref 8–23)
CO2: 22 mmol/L (ref 22–32)
Calcium: 8.2 mg/dL — ABNORMAL LOW (ref 8.9–10.3)
Chloride: 107 mmol/L (ref 98–111)
Creatinine, Ser: 0.98 mg/dL (ref 0.44–1.00)
GFR calc Af Amer: >60 mL/min (ref >60)
GFR calc non Af Amer: >60 mL/min (ref >60)
Glucose, Bld: 154 mg/dL — ABNORMAL HIGH (ref 70–99)
Potassium: 3.4 mmol/L — ABNORMAL LOW (ref 3.5–5.1)
Sodium: 141 mmol/L (ref 135–145)
Total Bilirubin: 0.7 mg/dL (ref 0.3–1.2)
Total Protein: 6.4 g/dL — ABNORMAL LOW (ref 6.5–8.1)

## 2018-12-10 MED ORDER — TRASTUZUMAB-DKST CHEMO 150 MG IV SOLR
150.0000 mg | Freq: Once | INTRAVENOUS | Status: AC
  Administered 2018-12-10: 150 mg via INTRAVENOUS
  Filled 2018-12-10: qty 7.14

## 2018-12-10 MED ORDER — DIPHENHYDRAMINE HCL 50 MG/ML IJ SOLN
INTRAMUSCULAR | Status: AC
  Filled 2018-12-10: qty 1

## 2018-12-10 MED ORDER — SODIUM CHLORIDE 0.9% FLUSH
10.0000 mL | INTRAVENOUS | Status: DC | PRN
  Administered 2018-12-10: 10 mL
  Filled 2018-12-10: qty 10

## 2018-12-10 MED ORDER — FAMOTIDINE IN NACL 20-0.9 MG/50ML-% IV SOLN
20.0000 mg | Freq: Once | INTRAVENOUS | Status: AC
  Administered 2018-12-10: 20 mg via INTRAVENOUS

## 2018-12-10 MED ORDER — DEXAMETHASONE SODIUM PHOSPHATE 10 MG/ML IJ SOLN
4.0000 mg | Freq: Once | INTRAMUSCULAR | Status: AC
  Administered 2018-12-10: 4 mg via INTRAVENOUS

## 2018-12-10 MED ORDER — SODIUM CHLORIDE 0.9 % IV SOLN
Freq: Once | INTRAVENOUS | Status: AC
  Administered 2018-12-10: 13:00:00 via INTRAVENOUS
  Filled 2018-12-10: qty 250

## 2018-12-10 MED ORDER — ACETAMINOPHEN 325 MG PO TABS
650.0000 mg | ORAL_TABLET | Freq: Once | ORAL | Status: AC
  Administered 2018-12-10: 650 mg via ORAL

## 2018-12-10 MED ORDER — HEPARIN SOD (PORK) LOCK FLUSH 100 UNIT/ML IV SOLN
500.0000 [IU] | Freq: Once | INTRAVENOUS | Status: AC | PRN
  Administered 2018-12-10: 500 [IU]
  Filled 2018-12-10: qty 5

## 2018-12-10 MED ORDER — SODIUM CHLORIDE 0.9 % IV SOLN
80.0000 mg/m2 | Freq: Once | INTRAVENOUS | Status: AC
  Administered 2018-12-10: 150 mg via INTRAVENOUS
  Filled 2018-12-10: qty 25

## 2018-12-10 MED ORDER — DEXAMETHASONE SODIUM PHOSPHATE 10 MG/ML IJ SOLN
INTRAMUSCULAR | Status: AC
  Filled 2018-12-10: qty 1

## 2018-12-10 MED ORDER — FAMOTIDINE IN NACL 20-0.9 MG/50ML-% IV SOLN
INTRAVENOUS | Status: AC
  Filled 2018-12-10: qty 50

## 2018-12-10 MED ORDER — ACETAMINOPHEN 325 MG PO TABS
ORAL_TABLET | ORAL | Status: AC
  Filled 2018-12-10: qty 2

## 2018-12-10 MED ORDER — DIPHENHYDRAMINE HCL 50 MG/ML IJ SOLN
25.0000 mg | Freq: Once | INTRAMUSCULAR | Status: AC
  Administered 2018-12-10: 25 mg via INTRAVENOUS

## 2018-12-10 NOTE — Patient Instructions (Signed)
[facility] Discharge Instructions for Patients Receiving Chemotherapy  Today you received the following chemotherapy agents Trastuzumab (OGIVRI) & Paclitaxel (TAXOL).  To help prevent nausea and vomiting after your treatment, we encourage you to take your nausea medication as prescribed.   If you develop nausea and vomiting that is not controlled by your nausea medication, call the clinic.   BELOW ARE SYMPTOMS THAT SHOULD BE REPORTED IMMEDIATELY:  *FEVER GREATER THAN 100.5 F  *CHILLS WITH OR WITHOUT FEVER  NAUSEA AND VOMITING THAT IS NOT CONTROLLED WITH YOUR NAUSEA MEDICATION  *UNUSUAL SHORTNESS OF BREATH  *UNUSUAL BRUISING OR BLEEDING  TENDERNESS IN MOUTH AND THROAT WITH OR WITHOUT PRESENCE OF ULCERS  *URINARY PROBLEMS  *BOWEL PROBLEMS  UNUSUAL RASH Items with * indicate a potential emergency and should be followed up as soon as possible.  Feel free to call the clinic should you have any questions or concerns. The clinic phone number is [phone].  Please show the [facility] at check-in to the Emergency Department and triage nurse.  Coronavirus (COVID-19) Are you at risk?  Are you at risk for the Coronavirus (COVID-19)?  To be considered HIGH RISK for Coronavirus (COVID-19), you have to meet the following criteria:  . Traveled to [place], [place], [place], [place] or [place]; or in the [place] to [place], [place], [place], or [place]; and have fever, cough, and shortness of breath within the last 2 weeks of travel OR . Been in close contact with a person diagnosed with COVID-19 within the last 2 weeks and have fever, cough, and shortness of breath . IF YOU DO NOT MEET THESE CRITERIA, YOU ARE CONSIDERED LOW RISK FOR COVID-19.  What to do if you are HIGH RISK for COVID-19?  Marland Kitchen If you are having a medical emergency, call 911. . Seek medical care right away. Before you go to a doctor's office, urgent care or emergency department, call  ahead and tell them about your recent travel, contact with someone diagnosed with COVID-19, and your symptoms. You should receive instructions from your physician's office regarding next steps of care.  . When you arrive at healthcare provider, tell the healthcare staff immediately you have returned from visiting [place], [place], [place], [place] or [place]; or traveled in the [place] to [place], [place], [place], or [place]; in the last two weeks or you have been in close contact with a person diagnosed with COVID-19 in the last 2 weeks.   . Tell the health care staff about your symptoms: fever, cough and shortness of breath. . After you have been seen by a medical provider, you will be either: o Tested for (COVID-19) and discharged home on quarantine except to seek medical care if symptoms worsen, and asked to  - Stay home and avoid contact with others until you get your results (4-5 days)  - Avoid travel on public transportation if possible (such as bus, train, or airplane) or o Sent to the Emergency Department by EMS for evaluation, COVID-19 testing, and possible admission depending on your condition and test results.  What to do if you are LOW RISK for COVID-19?  Reduce your risk of any infection by using the same precautions used for avoiding the common cold or flu:  Marland Kitchen Wash your hands often with soap and warm water for at least 20 seconds.  If soap and water are not readily available, use an alcohol-based hand sanitizer with at least 60% alcohol.  Marland Kitchen  If coughing or sneezing, cover your mouth and nose by coughing or sneezing into the elbow areas of your shirt or coat, into a tissue or into your sleeve (not your hands). . Avoid shaking hands with others and consider head nods or verbal greetings only. . Avoid touching your eyes, nose, or mouth with unwashed hands.  . Avoid close contact with people who are sick. . Avoid places or events with large numbers of people in one location,  like concerts or sporting events. . Carefully consider travel plans you have or are making. . If you are planning any travel outside or inside the [place], visit the CDC's Travelers' Health webpage for the latest health notices. . If you have some symptoms but not all symptoms, continue to monitor at home and seek medical attention if your symptoms worsen. . If you are having a medical emergency, call 911.   ADDITIONAL HEALTHCARE OPTIONS FOR PATIENTS  [facility] Tele[facility] / e-Visit: [email or web address]         [facility]: [phone]  [facility]: [phone]                   [facility]: [phone]    

## 2018-12-16 ENCOUNTER — Inpatient Hospital Stay: Payer: BC Managed Care – PPO

## 2018-12-16 ENCOUNTER — Other Ambulatory Visit: Payer: Self-pay

## 2018-12-16 ENCOUNTER — Other Ambulatory Visit: Payer: Self-pay | Admitting: Adult Health

## 2018-12-16 ENCOUNTER — Other Ambulatory Visit: Payer: Self-pay | Admitting: Oncology

## 2018-12-16 VITALS — BP 115/55 | HR 96 | Temp 98.3°F | Resp 18

## 2018-12-16 DIAGNOSIS — E876 Hypokalemia: Secondary | ICD-10-CM | POA: Diagnosis not present

## 2018-12-16 DIAGNOSIS — R0609 Other forms of dyspnea: Secondary | ICD-10-CM

## 2018-12-16 DIAGNOSIS — D6481 Anemia due to antineoplastic chemotherapy: Secondary | ICD-10-CM

## 2018-12-16 DIAGNOSIS — R0600 Dyspnea, unspecified: Secondary | ICD-10-CM

## 2018-12-16 DIAGNOSIS — I158 Other secondary hypertension: Secondary | ICD-10-CM

## 2018-12-16 DIAGNOSIS — Z5111 Encounter for antineoplastic chemotherapy: Secondary | ICD-10-CM | POA: Diagnosis not present

## 2018-12-16 DIAGNOSIS — C50412 Malignant neoplasm of upper-outer quadrant of left female breast: Secondary | ICD-10-CM

## 2018-12-16 DIAGNOSIS — I129 Hypertensive chronic kidney disease with stage 1 through stage 4 chronic kidney disease, or unspecified chronic kidney disease: Secondary | ICD-10-CM | POA: Diagnosis not present

## 2018-12-16 DIAGNOSIS — F17210 Nicotine dependence, cigarettes, uncomplicated: Secondary | ICD-10-CM | POA: Diagnosis not present

## 2018-12-16 DIAGNOSIS — T451X5A Adverse effect of antineoplastic and immunosuppressive drugs, initial encounter: Secondary | ICD-10-CM

## 2018-12-16 DIAGNOSIS — Z9012 Acquired absence of left breast and nipple: Secondary | ICD-10-CM | POA: Diagnosis not present

## 2018-12-16 DIAGNOSIS — Z171 Estrogen receptor negative status [ER-]: Secondary | ICD-10-CM | POA: Diagnosis not present

## 2018-12-16 DIAGNOSIS — Z95828 Presence of other vascular implants and grafts: Secondary | ICD-10-CM

## 2018-12-16 DIAGNOSIS — Z79899 Other long term (current) drug therapy: Secondary | ICD-10-CM | POA: Diagnosis not present

## 2018-12-16 DIAGNOSIS — N184 Chronic kidney disease, stage 4 (severe): Secondary | ICD-10-CM | POA: Diagnosis not present

## 2018-12-16 LAB — CBC WITH DIFFERENTIAL/PLATELET
Abs Immature Granulocytes: 0.2 10*3/uL — ABNORMAL HIGH (ref 0.00–0.07)
Basophils Absolute: 0.1 10*3/uL (ref 0.0–0.1)
Basophils Relative: 1 %
Eosinophils Absolute: 0.1 10*3/uL (ref 0.0–0.5)
Eosinophils Relative: 1 %
HCT: 26 % — ABNORMAL LOW (ref 36.0–46.0)
Hemoglobin: 8.2 g/dL — ABNORMAL LOW (ref 12.0–15.0)
Immature Granulocytes: 3 %
Lymphocytes Relative: 11 %
Lymphs Abs: 0.8 10*3/uL (ref 0.7–4.0)
MCH: 28.1 pg (ref 26.0–34.0)
MCHC: 31.5 g/dL (ref 30.0–36.0)
MCV: 89 fL (ref 80.0–100.0)
Monocytes Absolute: 0.5 10*3/uL (ref 0.1–1.0)
Monocytes Relative: 6 %
Neutro Abs: 6.1 10*3/uL (ref 1.7–7.7)
Neutrophils Relative %: 78 %
Platelets: 393 10*3/uL (ref 150–400)
RBC: 2.92 MIL/uL — ABNORMAL LOW (ref 3.87–5.11)
RDW: 16.1 % — ABNORMAL HIGH (ref 11.5–15.5)
WBC: 7.8 10*3/uL (ref 4.0–10.5)
nRBC: 0 % (ref 0.0–0.2)

## 2018-12-16 LAB — COMPREHENSIVE METABOLIC PANEL
ALT: 44 U/L (ref 0–44)
AST: 25 U/L (ref 15–41)
Albumin: 2.8 g/dL — ABNORMAL LOW (ref 3.5–5.0)
Alkaline Phosphatase: 89 U/L (ref 38–126)
Anion gap: 10 (ref 5–15)
BUN: 14 mg/dL (ref 8–23)
CO2: 21 mmol/L — ABNORMAL LOW (ref 22–32)
Calcium: 8.4 mg/dL — ABNORMAL LOW (ref 8.9–10.3)
Chloride: 108 mmol/L (ref 98–111)
Creatinine, Ser: 1.05 mg/dL — ABNORMAL HIGH (ref 0.44–1.00)
GFR calc Af Amer: >60 mL/min (ref >60)
GFR calc non Af Amer: 56 mL/min — ABNORMAL LOW (ref >60)
Glucose, Bld: 112 mg/dL — ABNORMAL HIGH (ref 70–99)
Potassium: 4.3 mmol/L (ref 3.5–5.1)
Sodium: 139 mmol/L (ref 135–145)
Total Bilirubin: 0.9 mg/dL (ref 0.3–1.2)
Total Protein: 6.4 g/dL — ABNORMAL LOW (ref 6.5–8.1)

## 2018-12-16 LAB — ABO/RH: ABO/RH(D): A POS

## 2018-12-16 MED ORDER — DIPHENHYDRAMINE HCL 50 MG/ML IJ SOLN
25.0000 mg | Freq: Once | INTRAMUSCULAR | Status: AC
  Administered 2018-12-16: 13:00:00 25 mg via INTRAVENOUS

## 2018-12-16 MED ORDER — FAMOTIDINE IN NACL 20-0.9 MG/50ML-% IV SOLN
INTRAVENOUS | Status: AC
  Filled 2018-12-16: qty 50

## 2018-12-16 MED ORDER — ACETAMINOPHEN 325 MG PO TABS
ORAL_TABLET | ORAL | Status: AC
  Filled 2018-12-16: qty 2

## 2018-12-16 MED ORDER — DIPHENHYDRAMINE HCL 50 MG/ML IJ SOLN
INTRAMUSCULAR | Status: AC
  Filled 2018-12-16: qty 1

## 2018-12-16 MED ORDER — TRASTUZUMAB-DKST CHEMO 150 MG IV SOLR
150.0000 mg | Freq: Once | INTRAVENOUS | Status: AC
  Administered 2018-12-16: 15:00:00 150 mg via INTRAVENOUS
  Filled 2018-12-16: qty 7.14

## 2018-12-16 MED ORDER — FAMOTIDINE IN NACL 20-0.9 MG/50ML-% IV SOLN
20.0000 mg | Freq: Once | INTRAVENOUS | Status: AC
  Administered 2018-12-16: 20 mg via INTRAVENOUS

## 2018-12-16 MED ORDER — SODIUM CHLORIDE 0.9 % IV SOLN
Freq: Once | INTRAVENOUS | Status: AC
  Administered 2018-12-16: 13:00:00 via INTRAVENOUS
  Filled 2018-12-16: qty 250

## 2018-12-16 MED ORDER — SODIUM CHLORIDE 0.9% FLUSH
10.0000 mL | INTRAVENOUS | Status: DC | PRN
  Administered 2018-12-16: 10 mL
  Filled 2018-12-16: qty 10

## 2018-12-16 MED ORDER — SODIUM CHLORIDE 0.9 % IV SOLN
80.0000 mg/m2 | Freq: Once | INTRAVENOUS | Status: AC
  Administered 2018-12-16: 150 mg via INTRAVENOUS
  Filled 2018-12-16: qty 25

## 2018-12-16 MED ORDER — DEXAMETHASONE SODIUM PHOSPHATE 10 MG/ML IJ SOLN
4.0000 mg | Freq: Once | INTRAMUSCULAR | Status: AC
  Administered 2018-12-16: 13:00:00 4 mg via INTRAVENOUS

## 2018-12-16 MED ORDER — DEXAMETHASONE SODIUM PHOSPHATE 10 MG/ML IJ SOLN
INTRAMUSCULAR | Status: AC
  Filled 2018-12-16: qty 1

## 2018-12-16 MED ORDER — HEPARIN SOD (PORK) LOCK FLUSH 100 UNIT/ML IV SOLN
500.0000 [IU] | Freq: Once | INTRAVENOUS | Status: AC | PRN
  Administered 2018-12-16: 500 [IU]
  Filled 2018-12-16: qty 5

## 2018-12-16 MED ORDER — ACETAMINOPHEN 325 MG PO TABS
650.0000 mg | ORAL_TABLET | Freq: Once | ORAL | Status: AC
  Administered 2018-12-16: 650 mg via ORAL

## 2018-12-16 NOTE — Progress Notes (Unsigned)
Dorothy Frederick hemoglobin continues to drop currently at 8.2, this is due to chemotherapy.  She has been short of breath.  We are going to transfuse 2 units this weekend and see how she is doing next week.

## 2018-12-16 NOTE — Patient Instructions (Signed)
[facility] Discharge Instructions for Patients Receiving Chemotherapy  Today you received the following chemotherapy agents Trastuzumab (OGIVRI) & Paclitaxel (TAXOL).  To help prevent nausea and vomiting after your treatment, we encourage you to take your nausea medication as prescribed.   If you develop nausea and vomiting that is not controlled by your nausea medication, call the clinic.   BELOW ARE SYMPTOMS THAT SHOULD BE REPORTED IMMEDIATELY:  *FEVER GREATER THAN 100.5 F  *CHILLS WITH OR WITHOUT FEVER  NAUSEA AND VOMITING THAT IS NOT CONTROLLED WITH YOUR NAUSEA MEDICATION  *UNUSUAL SHORTNESS OF BREATH  *UNUSUAL BRUISING OR BLEEDING  TENDERNESS IN MOUTH AND THROAT WITH OR WITHOUT PRESENCE OF ULCERS  *URINARY PROBLEMS  *BOWEL PROBLEMS  UNUSUAL RASH Items with * indicate a potential emergency and should be followed up as soon as possible.  Feel free to call the clinic should you have any questions or concerns. The clinic phone number is [phone].  Please show the [facility] at check-in to the Emergency Department and triage nurse.  Coronavirus (COVID-19) Are you at risk?  Are you at risk for the Coronavirus (COVID-19)?  To be considered HIGH RISK for Coronavirus (COVID-19), you have to meet the following criteria:  . Traveled to [place], [place], [place], [place] or [place]; or in the [place] to [place], [place], [place], or [place]; and have fever, cough, and shortness of breath within the last 2 weeks of travel OR . Been in close contact with a person diagnosed with COVID-19 within the last 2 weeks and have fever, cough, and shortness of breath . IF YOU DO NOT MEET THESE CRITERIA, YOU ARE CONSIDERED LOW RISK FOR COVID-19.  What to do if you are HIGH RISK for COVID-19?  Marland Kitchen If you are having a medical emergency, call 911. . Seek medical care right away. Before you go to a doctor's office, urgent care or emergency department, call  ahead and tell them about your recent travel, contact with someone diagnosed with COVID-19, and your symptoms. You should receive instructions from your physician's office regarding next steps of care.  . When you arrive at healthcare provider, tell the healthcare staff immediately you have returned from visiting [place], [place], [place], [place] or [place]; or traveled in the [place] to [place], [place], [place], or [place]; in the last two weeks or you have been in close contact with a person diagnosed with COVID-19 in the last 2 weeks.   . Tell the health care staff about your symptoms: fever, cough and shortness of breath. . After you have been seen by a medical provider, you will be either: o Tested for (COVID-19) and discharged home on quarantine except to seek medical care if symptoms worsen, and asked to  - Stay home and avoid contact with others until you get your results (4-5 days)  - Avoid travel on public transportation if possible (such as bus, train, or airplane) or o Sent to the Emergency Department by EMS for evaluation, COVID-19 testing, and possible admission depending on your condition and test results.  What to do if you are LOW RISK for COVID-19?  Reduce your risk of any infection by using the same precautions used for avoiding the common cold or flu:  Marland Kitchen Wash your hands often with soap and warm water for at least 20 seconds.  If soap and water are not readily available, use an alcohol-based hand sanitizer with at least 60% alcohol.  Marland Kitchen  If coughing or sneezing, cover your mouth and nose by coughing or sneezing into the elbow areas of your shirt or coat, into a tissue or into your sleeve (not your hands). . Avoid shaking hands with others and consider head nods or verbal greetings only. . Avoid touching your eyes, nose, or mouth with unwashed hands.  . Avoid close contact with people who are sick. . Avoid places or events with large numbers of people in one location,  like concerts or sporting events. . Carefully consider travel plans you have or are making. . If you are planning any travel outside or inside the [place], visit the CDC's Travelers' Health webpage for the latest health notices. . If you have some symptoms but not all symptoms, continue to monitor at home and seek medical attention if your symptoms worsen. . If you are having a medical emergency, call 911.   ADDITIONAL HEALTHCARE OPTIONS FOR PATIENTS  [facility] / e-Visit: [email or web address]         [facility]: [phone]  [facility]: [phone]                   [facility]: [phone]    

## 2018-12-16 NOTE — Patient Instructions (Signed)

## 2018-12-19 ENCOUNTER — Inpatient Hospital Stay: Payer: BC Managed Care – PPO

## 2018-12-19 DIAGNOSIS — F17210 Nicotine dependence, cigarettes, uncomplicated: Secondary | ICD-10-CM | POA: Diagnosis not present

## 2018-12-19 DIAGNOSIS — Z5111 Encounter for antineoplastic chemotherapy: Secondary | ICD-10-CM | POA: Diagnosis not present

## 2018-12-19 DIAGNOSIS — E876 Hypokalemia: Secondary | ICD-10-CM | POA: Diagnosis not present

## 2018-12-19 DIAGNOSIS — R0609 Other forms of dyspnea: Secondary | ICD-10-CM

## 2018-12-19 DIAGNOSIS — D6481 Anemia due to antineoplastic chemotherapy: Secondary | ICD-10-CM

## 2018-12-19 DIAGNOSIS — Z79899 Other long term (current) drug therapy: Secondary | ICD-10-CM | POA: Diagnosis not present

## 2018-12-19 DIAGNOSIS — I129 Hypertensive chronic kidney disease with stage 1 through stage 4 chronic kidney disease, or unspecified chronic kidney disease: Secondary | ICD-10-CM | POA: Diagnosis not present

## 2018-12-19 DIAGNOSIS — T451X5A Adverse effect of antineoplastic and immunosuppressive drugs, initial encounter: Secondary | ICD-10-CM

## 2018-12-19 DIAGNOSIS — Z9012 Acquired absence of left breast and nipple: Secondary | ICD-10-CM | POA: Diagnosis not present

## 2018-12-19 DIAGNOSIS — N184 Chronic kidney disease, stage 4 (severe): Secondary | ICD-10-CM | POA: Diagnosis not present

## 2018-12-19 DIAGNOSIS — Z171 Estrogen receptor negative status [ER-]: Secondary | ICD-10-CM | POA: Diagnosis not present

## 2018-12-19 DIAGNOSIS — R0600 Dyspnea, unspecified: Secondary | ICD-10-CM

## 2018-12-19 DIAGNOSIS — C50412 Malignant neoplasm of upper-outer quadrant of left female breast: Secondary | ICD-10-CM | POA: Diagnosis not present

## 2018-12-19 LAB — PREPARE RBC (CROSSMATCH)

## 2018-12-19 MED ORDER — ACETAMINOPHEN 325 MG PO TABS
650.0000 mg | ORAL_TABLET | Freq: Once | ORAL | Status: AC
  Administered 2018-12-19: 650 mg via ORAL

## 2018-12-19 MED ORDER — SODIUM CHLORIDE 0.9% FLUSH
3.0000 mL | INTRAVENOUS | Status: AC | PRN
  Administered 2018-12-19: 13:00:00 3 mL
  Filled 2018-12-19: qty 10

## 2018-12-19 MED ORDER — DIPHENHYDRAMINE HCL 25 MG PO CAPS
25.0000 mg | ORAL_CAPSULE | Freq: Once | ORAL | Status: AC
  Administered 2018-12-19: 25 mg via ORAL

## 2018-12-19 MED ORDER — SODIUM CHLORIDE 0.9% IV SOLUTION
250.0000 mL | Freq: Once | INTRAVENOUS | Status: AC
  Administered 2018-12-19: 250 mL via INTRAVENOUS
  Filled 2018-12-19: qty 250

## 2018-12-19 MED ORDER — HEPARIN SOD (PORK) LOCK FLUSH 100 UNIT/ML IV SOLN
250.0000 [IU] | INTRAVENOUS | Status: AC | PRN
  Administered 2018-12-19: 250 [IU]
  Filled 2018-12-19: qty 5

## 2018-12-19 NOTE — Patient Instructions (Signed)
Blood Transfusion, Adult, Care After This sheet gives you information about how to care for yourself after your procedure. Your doctor may also give you more specific instructions. If you have problems or questions, contact your doctor. Follow these instructions at home:   Take over-the-counter and prescription medicines only as told by your doctor.  Go back to your normal activities as told by your doctor.  Follow instructions from your doctor about how to take care of the area where an IV tube was put into your vein (insertion site). Make sure you: ? Wash your hands with soap and water before you change your bandage (dressing). If there is no soap and water, use hand sanitizer. ? Change your bandage as told by your doctor.  Check your IV insertion site every day for signs of infection. Check for: ? More redness, swelling, or pain. ? More fluid or blood. ? Warmth. ? Pus or a bad smell. Contact a doctor if:  You have more redness, swelling, or pain around the IV insertion site.  You have more fluid or blood coming from the IV insertion site.  Your IV insertion site feels warm to the touch.  You have pus or a bad smell coming from the IV insertion site.  Your pee (urine) turns pink, red, or brown.  You feel weak after doing your normal activities. Get help right away if:  You have signs of a serious allergic or body defense (immune) system reaction, including: ? Itchiness. ? Hives. ? Trouble breathing. ? Anxiety. ? Pain in your chest or lower back. ? Fever, flushing, and chills. ? Fast pulse. ? Rash. ? Watery poop (diarrhea). ? Throwing up (vomiting). ? Dark pee. ? Serious headache. ? Dizziness. ? Stiff neck. ? Yellow color in your face or the white parts of your eyes (jaundice). Summary  After a blood transfusion, return to your normal activities as told by your doctor.  Every day, check for signs of infection where the IV tube was put into your vein.  Some  signs of infection are warm skin, more redness and pain, more fluid or blood, and pus or a bad smell where the needle went in.  Contact your doctor if you feel weak or have any unusual symptoms. This information is not intended to replace advice given to you by your health care provider. Make sure you discuss any questions you have with your health care provider. Document Released: 01/28/2014 Document Revised: 05/14/2017 Document Reviewed: 09/01/2015 Elsevier Patient Education  2020 [employer]  

## 2018-12-21 LAB — BPAM RBC
Blood Product Expiration Date: 202012202359
Blood Product Expiration Date: 202012202359
ISSUE DATE / TIME: 202011280831
ISSUE DATE / TIME: 202011280831
Unit Type and Rh: 6200
Unit Type and Rh: 6200

## 2018-12-21 LAB — TYPE AND SCREEN
ABO/RH(D): A POS
Antibody Screen: NEGATIVE
Unit division: 0
Unit division: 0

## 2018-12-23 ENCOUNTER — Telehealth: Payer: Self-pay | Admitting: *Deleted

## 2018-12-23 NOTE — Telephone Encounter (Signed)
Returned call to [facility] for claim # [id number].  Requested clarification of treatment start date.  Confirmed first office visit 07-22-2018, Hospitalization adm date for left mastectomy node biopsy 09-16-2018 and first treatment received 10-21-2018.   [facility] reports they will use 07/22/2018 as first treatment date.  Denies further questions or needs at this time.

## 2018-12-24 ENCOUNTER — Inpatient Hospital Stay: Payer: BC Managed Care – PPO | Attending: Oncology

## 2018-12-24 ENCOUNTER — Inpatient Hospital Stay: Payer: BC Managed Care – PPO

## 2018-12-24 ENCOUNTER — Encounter: Payer: Self-pay | Admitting: Oncology

## 2018-12-24 ENCOUNTER — Encounter: Payer: Self-pay | Admitting: Adult Health

## 2018-12-24 ENCOUNTER — Other Ambulatory Visit: Payer: Self-pay

## 2018-12-24 ENCOUNTER — Inpatient Hospital Stay (HOSPITAL_BASED_OUTPATIENT_CLINIC_OR_DEPARTMENT_OTHER): Payer: BC Managed Care – PPO | Admitting: Adult Health

## 2018-12-24 VITALS — HR 99

## 2018-12-24 VITALS — BP 91/50 | HR 103 | Temp 98.5°F | Resp 18 | Ht 60.0 in | Wt 158.9 lb

## 2018-12-24 DIAGNOSIS — Z9012 Acquired absence of left breast and nipple: Secondary | ICD-10-CM | POA: Insufficient documentation

## 2018-12-24 DIAGNOSIS — C50412 Malignant neoplasm of upper-outer quadrant of left female breast: Secondary | ICD-10-CM

## 2018-12-24 DIAGNOSIS — Z171 Estrogen receptor negative status [ER-]: Secondary | ICD-10-CM

## 2018-12-24 DIAGNOSIS — E876 Hypokalemia: Secondary | ICD-10-CM | POA: Diagnosis not present

## 2018-12-24 DIAGNOSIS — Z95828 Presence of other vascular implants and grafts: Secondary | ICD-10-CM

## 2018-12-24 DIAGNOSIS — Z79899 Other long term (current) drug therapy: Secondary | ICD-10-CM | POA: Insufficient documentation

## 2018-12-24 DIAGNOSIS — Z5111 Encounter for antineoplastic chemotherapy: Secondary | ICD-10-CM | POA: Insufficient documentation

## 2018-12-24 DIAGNOSIS — I158 Other secondary hypertension: Secondary | ICD-10-CM

## 2018-12-24 DIAGNOSIS — F17210 Nicotine dependence, cigarettes, uncomplicated: Secondary | ICD-10-CM | POA: Insufficient documentation

## 2018-12-24 DIAGNOSIS — I129 Hypertensive chronic kidney disease with stage 1 through stage 4 chronic kidney disease, or unspecified chronic kidney disease: Secondary | ICD-10-CM | POA: Diagnosis not present

## 2018-12-24 DIAGNOSIS — N184 Chronic kidney disease, stage 4 (severe): Secondary | ICD-10-CM | POA: Insufficient documentation

## 2018-12-24 DIAGNOSIS — G629 Polyneuropathy, unspecified: Secondary | ICD-10-CM | POA: Diagnosis not present

## 2018-12-24 LAB — COMPREHENSIVE METABOLIC PANEL
ALT: 73 U/L — ABNORMAL HIGH (ref 0–44)
AST: 48 U/L — ABNORMAL HIGH (ref 15–41)
Albumin: 2.7 g/dL — ABNORMAL LOW (ref 3.5–5.0)
Alkaline Phosphatase: 179 U/L — ABNORMAL HIGH (ref 38–126)
Anion gap: 10 (ref 5–15)
BUN: 18 mg/dL (ref 8–23)
CO2: 20 mmol/L — ABNORMAL LOW (ref 22–32)
Calcium: 8.6 mg/dL — ABNORMAL LOW (ref 8.9–10.3)
Chloride: 106 mmol/L (ref 98–111)
Creatinine, Ser: 1.72 mg/dL — ABNORMAL HIGH (ref 0.44–1.00)
GFR calc Af Amer: 36 mL/min — ABNORMAL LOW (ref >60)
GFR calc non Af Amer: 31 mL/min — ABNORMAL LOW (ref >60)
Glucose, Bld: 121 mg/dL — ABNORMAL HIGH (ref 70–99)
Potassium: 4.3 mmol/L (ref 3.5–5.1)
Sodium: 136 mmol/L (ref 135–145)
Total Bilirubin: 0.9 mg/dL (ref 0.3–1.2)
Total Protein: 6.9 g/dL (ref 6.5–8.1)

## 2018-12-24 LAB — CBC WITH DIFFERENTIAL/PLATELET
Abs Immature Granulocytes: 0.13 10*3/uL — ABNORMAL HIGH (ref 0.00–0.07)
Basophils Absolute: 0.1 10*3/uL (ref 0.0–0.1)
Basophils Relative: 2 %
Eosinophils Absolute: 0.1 10*3/uL (ref 0.0–0.5)
Eosinophils Relative: 2 %
HCT: 33.9 % — ABNORMAL LOW (ref 36.0–46.0)
Hemoglobin: 10.6 g/dL — ABNORMAL LOW (ref 12.0–15.0)
Immature Granulocytes: 3 %
Lymphocytes Relative: 18 %
Lymphs Abs: 0.7 10*3/uL (ref 0.7–4.0)
MCH: 27.5 pg (ref 26.0–34.0)
MCHC: 31.3 g/dL (ref 30.0–36.0)
MCV: 87.8 fL (ref 80.0–100.0)
Monocytes Absolute: 1 10*3/uL (ref 0.1–1.0)
Monocytes Relative: 27 %
Neutro Abs: 1.9 10*3/uL (ref 1.7–7.7)
Neutrophils Relative %: 48 %
Platelets: 390 10*3/uL (ref 150–400)
RBC: 3.86 MIL/uL — ABNORMAL LOW (ref 3.87–5.11)
RDW: 20.4 % — ABNORMAL HIGH (ref 11.5–15.5)
WBC: 3.9 10*3/uL — ABNORMAL LOW (ref 4.0–10.5)
nRBC: 0 % (ref 0.0–0.2)

## 2018-12-24 MED ORDER — DEXAMETHASONE SODIUM PHOSPHATE 10 MG/ML IJ SOLN
INTRAMUSCULAR | Status: AC
  Filled 2018-12-24: qty 1

## 2018-12-24 MED ORDER — DIPHENHYDRAMINE HCL 25 MG PO CAPS
ORAL_CAPSULE | ORAL | Status: AC
  Filled 2018-12-24: qty 2

## 2018-12-24 MED ORDER — ACETAMINOPHEN 325 MG PO TABS
650.0000 mg | ORAL_TABLET | Freq: Once | ORAL | Status: AC
  Administered 2018-12-24: 650 mg via ORAL

## 2018-12-24 MED ORDER — SODIUM CHLORIDE 0.9 % IV SOLN
INTRAVENOUS | Status: DC
  Administered 2018-12-24: 14:00:00 via INTRAVENOUS
  Filled 2018-12-24 (×2): qty 250

## 2018-12-24 MED ORDER — FAMOTIDINE IN NACL 20-0.9 MG/50ML-% IV SOLN
INTRAVENOUS | Status: AC
  Filled 2018-12-24: qty 50

## 2018-12-24 MED ORDER — DIPHENHYDRAMINE HCL 25 MG PO CAPS
50.0000 mg | ORAL_CAPSULE | Freq: Once | ORAL | Status: AC
  Administered 2018-12-24: 50 mg via ORAL

## 2018-12-24 MED ORDER — SODIUM CHLORIDE 0.9 % IV SOLN
Freq: Once | INTRAVENOUS | Status: DC
  Filled 2018-12-24: qty 250

## 2018-12-24 MED ORDER — ACETAMINOPHEN 325 MG PO TABS
ORAL_TABLET | ORAL | Status: AC
  Filled 2018-12-24: qty 2

## 2018-12-24 MED ORDER — SODIUM CHLORIDE 0.9% FLUSH
10.0000 mL | INTRAVENOUS | Status: DC | PRN
  Administered 2018-12-24: 10 mL
  Filled 2018-12-24: qty 10

## 2018-12-24 MED ORDER — TRASTUZUMAB-DKST CHEMO 150 MG IV SOLR
450.0000 mg | Freq: Once | INTRAVENOUS | Status: AC
  Administered 2018-12-24: 450 mg via INTRAVENOUS
  Filled 2018-12-24: qty 21.43

## 2018-12-24 MED ORDER — DIPHENHYDRAMINE HCL 50 MG/ML IJ SOLN
INTRAMUSCULAR | Status: AC
  Filled 2018-12-24: qty 1

## 2018-12-24 MED ORDER — HEPARIN SOD (PORK) LOCK FLUSH 100 UNIT/ML IV SOLN
500.0000 [IU] | Freq: Once | INTRAVENOUS | Status: AC | PRN
  Administered 2018-12-24: 500 [IU]
  Filled 2018-12-24: qty 5

## 2018-12-24 NOTE — Progress Notes (Signed)
[place]  [phone] [phone]    ID: Dorothy Frederick DOB: December 25, 1955  MR#: [id number]  PIR#:[id number]  Patient Care Team: Dorothy Bien, Frederick as PCP - General (Family Medicine) Dorothy Latch, Frederick as PCP - Cardiology (Cardiology) Dorothy Kaufmann, RN as Oncology Nurse Navigator Dorothy Germany, RN as Oncology Nurse Navigator Dorothy Skates, Frederick as Consulting Physician (General Surgery) Magrinat, Virgie Dad, Frederick as Consulting Physician (Oncology) Dorothy Pray, Frederick as Consulting Physician (Radiation Oncology) Croitoru, Dani Gobble, Frederick as Consulting Physician (Cardiology) OTHER Frederick:   CHIEF COMPLAINT: Estrogen receptor negative breast cancer  CURRENT TREATMENT: Adjuvant chemotherapy and anti-HER-2 immunotherapy   INTERVAL HISTORY: Dorothy Frederick returns today for follow-up and treatment of her estrogen receptor negative breast cancer.   She began adjuvant chemotherapy consisting of paclitaxel and trastuzumab two weeks ago.  She is due for week #9 of treatment.  Over the past couple of weeks she has developed peripheral neuropathy.  This was initially intermittent, however has changed to constant.   REVIEW OF SYSTEMS: Dorothy Frederick denies any fever or chills.  She is without cough, shortness of breath, bowel/bladder changes, headaches, vision issues, nausea, vomiting, mucositis, or any other concerns.  Her anemia is improved since she received a blood transfusion last week and she is feeling much better.  Her fatigue is improved.  A detailed ROS was otherwise non contributory.     HISTORY OF CURRENT ILLNESS: From the original intake note:  Dorothy Frederick has a history of left breast cancer diagnosed in 2000, and treated with a partial mastectomy.  She has been followed by Dr. Matthew Frederick in [facility]. She was also treated with Tamoxifen for 1.5 years, which she tolerated well, but had to discontinue due to financial difficulties. She did not receive chemotherapy. She  notes that her cancer was lymph node negative.  More recently, she had routine screening mammography on 07/06/2018 showing: Breast Density Category B. On mammography, there is a 1.5 cm oval mass with associated calcifications in the left breast upper outer quadrant posterior depth 6.6 cm from the nipple. This mass is approximately 3 cm anterior to the lumpectomy site in the upper outer posterior left breast. No other significant masses, calcifications, or other findings are seen in either breast.   Accordingly on 07/13/2018 she proceeded to biopsy of the left breast area in question. The pathology from this procedure showed ([id number]): invasive ductal carcinoma, grade III, 2 o'clock, 5 cm from the nipple. Prognostic indicators significant for: estrogen receptor, 0% negative and progesterone receptor, 0% negative. Proliferation marker Ki67 at 70%. HER2 positive (3+) by immunohistochemistry.  The patient's subsequent history is as detailed below.   PAST MEDICAL HISTORY: Past Medical History:  Diagnosis Date   Angina at rest [facility])    Anxiety    Cancer ([place]) 2000   left breast cancer-partial mastectomy   COPD (chronic obstructive pulmonary disease) ([place])    Depression    Family history of breast cancer    Family history of esophageal cancer    Family history of lung cancer    Family history of prostate cancer    GERD (gastroesophageal reflux disease)    Hyperlipidemia    Hypertension    Hypothyroidism    Recurrent breast cancer, left ([place])      PAST SURGICAL HISTORY: Past Surgical History:  Procedure Laterality Date   ADENOIDECTOMY     LEFT HEART CATHETERIZATION WITH CORONARY ANGIOGRAM N/A 10/21/2013   Procedure: LEFT HEART CATHETERIZATION WITH CORONARY ANGIOGRAM;  Surgeon: Dorothy Gave  Santina Evans, Frederick;  Location: [place] CATH LAB;  Service: Cardiovascular;  Laterality: N/A;   MASTECTOMY W/ SENTINEL NODE BIOPSY Left 09/16/2018   Procedure: LEFT TOTAL MASTECTOMY WITH LEFT  AXILLARY DEEP SENTINEL LYMPH NODE BIOPSY AND INJECT BLUE DYE;  Surgeon: Dorothy Skates, Frederick;  Location: [place];  Service: General;  Laterality: Left;   MASTECTOMY, PARTIAL Left 2000   PORTACATH PLACEMENT Right 09/16/2018   Procedure: INSERTION PORT-A-CATH;  Surgeon: Dorothy Skates, Frederick;  Location: [place];  Service: General;  Laterality: Right;   TONSILLECTOMY       FAMILY HISTORY: Family History  Problem Relation Age of Onset   Heart disease Mother    Hypertension Father    Cancer Father    Prostate cancer Father    Hypertension Sister    Breast cancer Sister        dx 76s   Heart disease Sister    Hypertension Brother    Hyperlipidemia Maternal Grandmother    Stroke Maternal Grandmother    Hypertension Brother    Breast cancer Maternal Aunt    Lung cancer Maternal Aunt    Esophageal cancer Maternal Aunt    Stomach cancer Paternal Grandmother    Dorothy Frederick's father died from prostate cancer at age 10. Patients' mother died from heart complications at age 89. The patient has 2 half brothers and 2 half sisters. Patient denies anyone in her family having ovarian, prostate, or pancreatic cancer. 1 maternal aunt and 60 half aunt (half-sister of the patient's mother) had breast cancer breast cancer.    GYNECOLOGIC HISTORY:  No LMP recorded. Patient is postmenopausal. 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 She is divorced. She lives with her daughter and two grandchildren. Her daughter, Dorothy Frederick, is 74 and works for Dorothy Frederick as a Clinical research associate.  Dorothy Frederick's children Dorothy Frederick, 13, and Dorothy Frederick, 10 also live with the patient. Dorothy Frederick's other daughter, Dorothy Frederick, is a Licensed conveyancer at [employer]. Dorothy Frederick has 4 grandchildren in total.   ADVANCED  DIRECTIVES:  Not in place. Dorothy Frederick was given the appropriate forms on 07/22/18 to fill out and return at their own discretion.     HEALTH MAINTENANCE: Social History   Tobacco Use   Smoking status: Former Smoker    Packs/day: 1.00    Years: 38.00    Pack years: 38.00    Types: Cigarettes   Smokeless tobacco: Never Used  Substance Use Topics   Alcohol use: No   Drug use: No    Colonoscopy: 2017  PAP:    Bone density: yes, [facility]; osteoporosis Mammography: up to date  Allergies  Allergen Reactions   Benadryl [Diphenhydramine Hcl (Sleep)] Palpitations    Increased heartrate   Sulfa Antibiotics Rash    Current Outpatient Medications  Medication Sig Dispense Refill   albuterol (PROVENTIL HFA;VENTOLIN HFA) 108 (90 BASE) MCG/ACT inhaler Inhale 2 puffs into the lungs every 6 (six) hours as needed for wheezing or shortness of breath.     alendronate (FOSAMAX) 70 MG tablet Take 1 tablet by mouth once a week.     atorvastatin (LIPITOR) 10 MG tablet Take 40 mg by mouth daily.      Cholecalciferol 1000 UNITS capsule Take 500 Units by mouth daily.      cholestyramine (QUESTRAN) 4 g  packet Take 1 packet (4 g total) by mouth 2 (two) times daily. 60 each 1   clotrimazole-betamethasone (LOTRISONE) cream Apply 1 application topically 2 (two) times daily. 60 g 1   escitalopram (LEXAPRO) 10 MG tablet Take 10 mg by mouth daily.  3   famotidine (PEPCID) 40 MG tablet Take 40 mg by mouth daily.     Fluticasone-Umeclidin-Vilant (TRELEGY ELLIPTA) 100-62.5-25 MCG/INH AEPB Inhale 1 puff into the lungs daily as needed.     gabapentin (NEURONTIN) 100 MG capsule Take 2 capsules (200 mg total) by mouth at bedtime. 60 capsule 2   levothyroxine (SYNTHROID, LEVOTHROID) 50 MCG tablet Take 1 tablet by mouth daily.     lidocaine-prilocaine (EMLA) cream Apply to affected area once 30 g 3   LORazepam (ATIVAN) 0.5 MG tablet Take 1 tablet (0.5 mg total) by mouth at bedtime as needed (Nausea or  vomiting). 30 tablet 0   olmesartan (BENICAR) 20 MG tablet Take 20 mg by mouth daily.  11   ondansetron (ZOFRAN) 8 MG tablet Take 1 tablet (8 mg total) by mouth 2 (two) times daily as needed (Nausea or vomiting). 30 tablet 1   potassium chloride (KLOR-CON) 10 MEQ tablet TAKE 1 TABLET BY MOUTH EVERY DAY 30 tablet 0   No current facility-administered medications for this visit.      OBJECTIVE:   Vitals:   12/24/18 1254  BP: (!) 91/50  Pulse: (!) 103  Resp: 18  Temp: 98.5 F (36.9 C)  SpO2: 97%   Wt Readings from Last 3 Encounters:  12/24/18 158 lb 14.4 oz (72.1 kg)  12/10/18 166 lb (75.3 kg)  12/03/18 166 lb 12.8 oz (75.7 kg)   Body mass index is 31.03 kg/m.    ECOG FS:1 - Symptomatic but completely ambulatory GENERAL: Patient is a well appearing female in no acute distress HEENT:  Sclerae anicteric.  Oropharynx clear and moist. No ulcerations or evidence of oropharyngeal candidiasis. Neck is supple.  NODES:  No cervical, supraclavicular, or axillary lymphadenopathy palpated.  BREAST EXAM:  Deferred today LUNGS:  Clear to auscultation bilaterally.  No wheezes or rhonchi. HEART:  Regular rate and rhythm.  HR Recheck was 98. No murmur appreciated. ABDOMEN:  Soft, nontender.  Positive, normoactive bowel sounds. No organomegaly palpated. MSK:  No focal spinal tenderness to palpation. Full range of motion bilaterally in the upper extremities. EXTREMITIES:  No peripheral edema.   SKIN:  Clear with no obvious rashes or skin changes. No nail dyscrasia. NEURO:  Nonfocal. Well oriented.  Appropriate affect.     LAB RESULTS:  CMP     Component Value Date/Time   NA 139 12/16/2018 1239   K 4.3 12/16/2018 1239   CL 108 12/16/2018 1239   CO2 21 (L) 12/16/2018 1239   GLUCOSE 112 (H) 12/16/2018 1239   BUN 14 12/16/2018 1239   CREATININE 1.05 (H) 12/16/2018 1239   CREATININE 1.11 (H) 07/22/2018 0835   CREATININE 0.97 10/18/2013 1527   CALCIUM 8.4 (L) 12/16/2018 1239   PROT  6.4 (L) 12/16/2018 1239   ALBUMIN 2.8 (L) 12/16/2018 1239   AST 25 12/16/2018 1239   AST 17 07/22/2018 0835   ALT 44 12/16/2018 1239   ALT 20 07/22/2018 0835   ALKPHOS 89 12/16/2018 1239   BILITOT 0.9 12/16/2018 1239   BILITOT 0.6 07/22/2018 0835   GFRNONAA 56 (L) 12/16/2018 1239   GFRNONAA 53 (L) 07/22/2018 0835   GFRAA >60 12/16/2018 1239   GFRAA >60 07/22/2018 7972  No results found for: TOTALPROTELP, ALBUMINELP, A1GS, A2GS, BETS, BETA2SER, GAMS, MSPIKE, SPEI  No results found for: KPAFRELGTCHN, LAMBDASER, KAPLAMBRATIO  Lab Results  Component Value Date   WBC 3.9 (L) 12/24/2018   NEUTROABS 1.9 12/24/2018   HGB 10.6 (L) 12/24/2018   HCT 33.9 (L) 12/24/2018   MCV 87.8 12/24/2018   PLT 390 12/24/2018    '@LASTCHEMISTRY' @  Lab Results  Component Value Date   LABCA2 24 09/08/2006    No components found for: [id number]  No results for input(s): INR in the last 168 hours.  Lab Results  Component Value Date   LABCA2 24 09/08/2006    No results found for: BXU383  No results found for: FXO329  No results found for: VBT660  No results found for: CA2729  No components found for: HGQUANT  No results found for: CEA1 / No results found for: CEA1   No results found for: AFPTUMOR  No results found for: CHROMOGRNA  No results found for: PSA1  Appointment on 12/24/2018  Component Date Value Ref Range Status   WBC 12/24/2018 3.9* 4.0 - 10.5 K/uL Final   RBC 12/24/2018 3.86* 3.87 - 5.11 MIL/uL Final   Hemoglobin 12/24/2018 10.6* 12.0 - 15.0 g/dL Final   HCT 12/24/2018 33.9* 36.0 - 46.0 % Final   MCV 12/24/2018 87.8  80.0 - 100.0 fL Final   MCH 12/24/2018 27.5  26.0 - 34.0 pg Final   MCHC 12/24/2018 31.3  30.0 - 36.0 g/dL Final   RDW 12/24/2018 20.4* 11.5 - 15.5 % Final   Platelets 12/24/2018 390  150 - 400 K/uL Final   nRBC 12/24/2018 0.0  0.0 - 0.2 % Final   Neutrophils Relative % 12/24/2018 48  % Final   Neutro Abs 12/24/2018 1.9  1.7 - 7.7 K/uL  Final   Lymphocytes Relative 12/24/2018 18  % Final   Lymphs Abs 12/24/2018 0.7  0.7 - 4.0 K/uL Final   Monocytes Relative 12/24/2018 27  % Final   Monocytes Absolute 12/24/2018 1.0  0.1 - 1.0 K/uL Final   Eosinophils Relative 12/24/2018 2  % Final   Eosinophils Absolute 12/24/2018 0.1  0.0 - 0.5 K/uL Final   Basophils Relative 12/24/2018 2  % Final   Basophils Absolute 12/24/2018 0.1  0.0 - 0.1 K/uL Final   Immature Granulocytes 12/24/2018 3  % Final   Abs Immature Granulocytes 12/24/2018 0.13* 0.00 - 0.07 K/uL Final   Performed at [facility], [place] [address] [place], [place] [address]    (this displays the last labs from the last 3 days)  No results found for: TOTALPROTELP, ALBUMINELP, A1GS, A2GS, BETS, BETA2SER, GAMS, MSPIKE, SPEI (this displays SPEP labs)  No results found for: KPAFRELGTCHN, LAMBDASER, KAPLAMBRATIO (kappa/lambda light chains)  No results found for: HGBA, HGBA2QUANT, HGBFQUANT, HGBSQUAN (Hemoglobinopathy evaluation)   No results found for: LDH  No results found for: IRON, TIBC, IRONPCTSAT (Iron and TIBC)  No results found for: FERRITIN  Urinalysis No results found for: COLORURINE, APPEARANCEUR, LABSPEC, PHURINE, GLUCOSEU, HGBUR, BILIRUBINUR, KETONESUR, PROTEINUR, UROBILINOGEN, NITRITE, LEUKOCYTESUR   STUDIES:  Dg Chest 2 View  Result Date: 12/03/2018 CLINICAL DATA:  Shortness of breath. EXAM: CHEST - 2 VIEW COMPARISON:  09/16/2018 09/09/2018. FINDINGS: PowerPort catheter with tip over superior vena cava. Heart size normal. Mild bibasilar atelectasis again noted. No pleural effusion or pneumothorax. Diffuse thoracic spine osteopenia degenerative change. Mild stable midthoracic vertebral body compression fracture. IMPRESSION: 1.  PowerPort catheter with tip over superior vena cava. 2.  Low lung volumes with mild bibasilar atelectasis again noted. Electronically Signed   By: Marcello Moores  Register   On: 12/03/2018 14:02      ELIGIBLE FOR AVAILABLE RESEARCH PROTOCOL: no   ASSESSMENT: 63 y.o. [place], [place] woman status post left breast upper outer quadrant biopsy 07/13/2018 for a clinical T1c N0, stage IA invasive ductal carcinoma, grade 3, estrogen and progesterone receptor negative, HER-2 amplified, with an MIB-1 of 70%  (1) status post left mastectomy with sentinel lymph node sampling 09/16/2018 for a pT2, pN0, stage IIA invasive ductal carcinoma, grade 3, with negative margins  (a) a total of 6 sentinel lymph nodes were removed  (b) the patient is not planning on reconstruction   (2) adjuvant chemotherapy with paclitaxel and trastuzumab weekly x12 started on 10/21/2018  (a) Paclitaxel discontinued after 8 cycles due to peripheral neuropathy.  (3) trastuzumab to be continued every 21 days to total 1 year  (a) echo 07/27/2018 shows an ejection fraction in the 55-60% range  (b) Next echo scheduled for mid December 2020  (4) no indication for postmastectomy radiation  (5) genetic testing on 07/22/2018 through the [facility] Common Hereditary cancer panel found no pathogenic mutations.in APC, ATM, AXIN2, BARD1, BMPR1A, BRCA1, BRCA2, BRIP1, CDH1, CDKN2A (p14ARF), CDKN2A (p16INK4a), CKD4, CHEK2, CTNNA1, DICER1, EPCAM (Deletion/duplication testing only), GREM1 (promoter region deletion/duplication testing only), KIT, MEN1, MLH1, MSH2, MSH3, MSH6, MUTYH, NBN, NF1, NHTL1, PALB2, PDGFRA, PMS2, POLD1, POLE, PTEN, RAD50, RAD51C, RAD51D, SDHB, SDHC, SDHD, SMAD4, SMARCA4. STK11, TP53, TSC1, TSC2, and VHL.  The following genes were evaluated for sequence changes only: SDHA and HOXB13 c.251G>A variant only.   PLAN: Justyn is feeling much better since receiving a blood transfuison.  Her fatigue, weakness, and DOE has almost completely resolved.  Her hemoglobin is 10.6 today which is up from 8.2 that it was last week.    Unfortunately Robertha has developed peripheral neuropathy.  Due to this, she will stop taking Paclitaxel  weekly and will go to Trastuzumab every 3 weeks.  She understands this.  I reviewed with her that if we continue the Paclitaxel she could end up with permanent disability from the neuropathy.  She will continue on the Gabapentin for the neuropathy.    Lilliona will continue on Trastuzumab.  She is due for her next echo which is scheduled later this month.  I recommended healthy diet and exercise.  There is no indication for post mastectomy radiation.    Mickala will return in three weeks for labs, f/u with Dr. Jana Hakim, and her next trastuzumab.   She was recommended to continue with the appropriate pandemic precautions. She knows to call for any questions that may arise between now and her next appointment.  We are happy to see her sooner if needed.  The above was reviewed with Dr. Jana Hakim in detail and he is in agreement with the above plan.  A total of (30) minutes of face-to-face time was spent with this patient with greater than 50% of that time in counseling and care-coordination.   Wilber Bihari  12/24/18 1:05 PM Medical Oncology and Hematology [facility] [place], [place] [address] Tel. [phone]    Fax. [phone]

## 2018-12-24 NOTE — Patient Instructions (Signed)
[facility] Discharge Instructions for Patients Receiving Chemotherapy  Today you received the following chemotherapy agents OGIVRI  To help prevent nausea and vomiting after your treatment, we encourage you to take your nausea medication.   If you develop nausea and vomiting that is not controlled by your nausea medication, call the clinic.   BELOW ARE SYMPTOMS THAT SHOULD BE REPORTED IMMEDIATELY:  *FEVER GREATER THAN 100.5 F  *CHILLS WITH OR WITHOUT FEVER  NAUSEA AND VOMITING THAT IS NOT CONTROLLED WITH YOUR NAUSEA MEDICATION  *UNUSUAL SHORTNESS OF BREATH  *UNUSUAL BRUISING OR BLEEDING  TENDERNESS IN MOUTH AND THROAT WITH OR WITHOUT PRESENCE OF ULCERS  *URINARY PROBLEMS  *BOWEL PROBLEMS  UNUSUAL RASH Items with * indicate a potential emergency and should be followed up as soon as possible.  Feel free to call the clinic should you have any questions or concerns. The clinic phone number is [phone].  Please show the [facility] at check-in to the Emergency Department and triage nurse.

## 2018-12-24 NOTE — Addendum Note (Signed)
Addended by: Scot Dock on: 12/24/2018 01:45 PM   Modules accepted: Orders

## 2018-12-31 ENCOUNTER — Inpatient Hospital Stay: Payer: BC Managed Care – PPO

## 2018-12-31 ENCOUNTER — Telehealth: Payer: Self-pay | Admitting: Oncology

## 2018-12-31 NOTE — Telephone Encounter (Signed)
Returned patient's phone call regarding cancelling 12/10 and 12/07 appointments. Per patient's request appointment has been cancelled.   Message to provider.

## 2019-01-01 ENCOUNTER — Other Ambulatory Visit: Payer: Self-pay | Admitting: Oncology

## 2019-01-07 ENCOUNTER — Other Ambulatory Visit: Payer: Self-pay | Admitting: Adult Health

## 2019-01-07 ENCOUNTER — Ambulatory Visit: Payer: BC Managed Care – PPO

## 2019-01-07 ENCOUNTER — Other Ambulatory Visit: Payer: BC Managed Care – PPO

## 2019-01-07 DIAGNOSIS — C50412 Malignant neoplasm of upper-outer quadrant of left female breast: Secondary | ICD-10-CM

## 2019-01-11 ENCOUNTER — Ambulatory Visit (HOSPITAL_COMMUNITY)
Admission: RE | Admit: 2019-01-11 | Discharge: 2019-01-11 | Disposition: A | Discharge status: Home / Self Care | Payer: BC Managed Care – PPO | Source: Ambulatory Visit | Attending: Oncology | Admitting: Oncology

## 2019-01-11 ENCOUNTER — Other Ambulatory Visit: Payer: Self-pay

## 2019-01-11 DIAGNOSIS — K219 Gastro-esophageal reflux disease without esophagitis: Secondary | ICD-10-CM | POA: Insufficient documentation

## 2019-01-11 DIAGNOSIS — J449 Chronic obstructive pulmonary disease, unspecified: Secondary | ICD-10-CM | POA: Insufficient documentation

## 2019-01-11 DIAGNOSIS — Z5181 Encounter for therapeutic drug level monitoring: Secondary | ICD-10-CM | POA: Insufficient documentation

## 2019-01-11 DIAGNOSIS — I119 Hypertensive heart disease without heart failure: Secondary | ICD-10-CM | POA: Diagnosis not present

## 2019-01-11 DIAGNOSIS — E039 Hypothyroidism, unspecified: Secondary | ICD-10-CM | POA: Insufficient documentation

## 2019-01-11 DIAGNOSIS — E785 Hyperlipidemia, unspecified: Secondary | ICD-10-CM | POA: Diagnosis not present

## 2019-01-11 DIAGNOSIS — C50412 Malignant neoplasm of upper-outer quadrant of left female breast: Secondary | ICD-10-CM | POA: Insufficient documentation

## 2019-01-11 DIAGNOSIS — Z171 Estrogen receptor negative status [ER-]: Secondary | ICD-10-CM | POA: Insufficient documentation

## 2019-01-11 NOTE — Progress Notes (Signed)
  Echocardiogram 2D Echocardiogram has been performed.  Gracen Ringwald G Tuleen Mandelbaum 01/11/2019, 11:04 AM

## 2019-01-12 NOTE — Progress Notes (Signed)
[place]  [phone] [phone]    ID: Dorothy Frederick DOB: 04-14-1955  MR#: [id number]  KGU#:[id number]  Patient Care Team: [name], MD as PCP - General (Family Medicine) Skeet Latch, MD as PCP - Cardiology (Cardiology) Mauro Kaufmann, RN as Oncology Nurse Navigator Rockwell Germany, RN as Oncology Nurse Navigator Fanny Skates, MD as Consulting Physician (General Surgery) Eero Dini, Virgie Dad, MD as Consulting Physician (Oncology) Gery Pray, MD as Consulting Physician (Radiation Oncology) Croitoru, Dani Gobble, MD as Consulting Physician (Cardiology) OTHER MD:   CHIEF COMPLAINT: Estrogen receptor negative breast cancer  CURRENT TREATMENT: Adjuvant chemotherapy and anti-HER-2 immunotherapy   INTERVAL HISTORY: Dorothy Frederick returns today for follow-up and treatment of her estrogen receptor negative breast cancer.   She was started on paclitaxel and trastuzumab on 10/21/2018 and received 8 doses, at which point she developed peripheral neuropathy and the paclitaxel was stopped.  The trastuzumab is continued at every 3 weeks, and that was started 12/24/2018.  She is here for continuing trastuzumab, and since her last visit, she underwent repeat echocardiogram on 01/11/2019, which showed an ejection fraction of 55-60%.   REVIEW OF SYSTEMS: Dorothy Frederick tells me her peripheral neuropathy is a little bit better.  Her hands in particular are better although she still cannot open a soda can.  She has 3 toes on the left and 2 toes on the right that are entirely numb.  When she wear shoes it does not matter and she does wear shoes in her home.  When she is barefoot she has to hold onto things otherwise she is unsteady.  Fortunately the toes and fingers do not hurt at night which is when people do complain about this.  She is taking gabapentin 200 mg at bedtime and that is helping.  Aside from these issues a detailed review of systems today was  noncontributory   HISTORY OF CURRENT ILLNESS: From the original intake note:  Dorothy Frederick has a history of left breast cancer diagnosed in 2000, and treated with a partial mastectomy.  She has been followed by Dr. Matthew Saras in [facility]. She was also treated with Tamoxifen for 1.5 years, which she tolerated well, but had to discontinue due to financial difficulties. She did not receive chemotherapy. She notes that her cancer was lymph node negative.  More recently, she had routine screening mammography on 07/06/2018 showing: Breast Density Category B. On mammography, there is a 1.5 cm oval mass with associated calcifications in the left breast upper outer quadrant posterior depth 6.6 cm from the nipple. This mass is approximately 3 cm anterior to the lumpectomy site in the upper outer posterior left breast. No other significant masses, calcifications, or other findings are seen in either breast.   Accordingly on 07/13/2018 she proceeded to biopsy of the left breast area in question. The pathology from this procedure showed ([id number]): invasive ductal carcinoma, grade III, 2 o'clock, 5 cm from the nipple. Prognostic indicators significant for: estrogen receptor, 0% negative and progesterone receptor, 0% negative. Proliferation marker Ki67 at 70%. HER2 positive (3+) by immunohistochemistry.  The patient's subsequent history is as detailed below.   PAST MEDICAL HISTORY: Past Medical History:  Diagnosis Date  . Angina at rest [facility])   . Anxiety   . Cancer [facility]) 2000   left breast cancer-partial mastectomy  . COPD (chronic obstructive pulmonary disease) ([place])   . Depression   . Family history of breast cancer   . Family history of esophageal cancer   .  Family history of lung cancer   . Family history of prostate cancer   . GERD (gastroesophageal reflux disease)   . Hyperlipidemia   . Hypertension   . Hypothyroidism   . Recurrent breast cancer, left ([place])     PAST SURGICAL  HISTORY: Past Surgical History:  Procedure Laterality Date  . ADENOIDECTOMY    . LEFT HEART CATHETERIZATION WITH CORONARY ANGIOGRAM N/A 10/21/2013   Procedure: LEFT HEART CATHETERIZATION WITH CORONARY ANGIOGRAM;  Surgeon: Burnell Blanks, MD;  Location: [facility] CATH LAB;  Service: Cardiovascular;  Laterality: N/A;  . MASTECTOMY W/ SENTINEL NODE BIOPSY Left 09/16/2018   Procedure: LEFT TOTAL MASTECTOMY WITH LEFT AXILLARY DEEP SENTINEL LYMPH NODE BIOPSY AND INJECT BLUE DYE;  Surgeon: Fanny Skates, MD;  Location: [place];  Service: General;  Laterality: Left;  Marland Kitchen MASTECTOMY, PARTIAL Left 2000  . PORTACATH PLACEMENT Right 09/16/2018   Procedure: INSERTION PORT-A-CATH;  Surgeon: Fanny Skates, MD;  Location: [address];  Service: General;  Laterality: Right;  . TONSILLECTOMY      FAMILY HISTORY: Family History  Problem Relation Age of Onset  . Heart disease Mother   . Hypertension Father   . Cancer Father   . Prostate cancer Father   . Hypertension Sister   . Breast cancer Sister        dx 42s  . Heart disease Sister   . Hypertension Brother   . Hyperlipidemia Maternal Grandmother   . Stroke Maternal Grandmother   . Hypertension Brother   . Breast cancer Maternal Aunt   . Lung cancer Maternal Aunt   . Esophageal cancer Maternal Aunt   . Stomach cancer Paternal Grandmother    Dorothy Frederick's father died from prostate cancer at age 69. Patients' mother died from heart complications at age 43. The patient has 2 half brothers and 2 half sisters. Patient denies anyone in her family having ovarian, prostate, or pancreatic cancer. 1 maternal aunt and 18 half aunt (half-sister of the patient's mother) had breast cancer breast cancer.    GYNECOLOGIC HISTORY:  No LMP recorded. Patient is postmenopausal. 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 She is divorced. She lives with her daughter and two grandchildren. Her daughter, Judson Roch, is 43 and works for [employer] as a Clinical research associate.  Dorothy Frederick's children Piper, 13, and Conner, 10 also live with the patient. Taylore's other daughter, Sharyn Lull, is a Licensed conveyancer at [employer]. Lilyian has 4 grandchildren in total.   ADVANCED DIRECTIVES:  Not in place. Marilene was given the appropriate forms on 07/22/18 to fill out and return at their own discretion.     HEALTH MAINTENANCE: Social History   Tobacco Use  . Smoking status: Former Smoker    Packs/day: 1.00    Years: 38.00    Pack years: 38.00    Types: Cigarettes  . Smokeless tobacco: Never Used  Substance Use Topics  . Alcohol use: No  . Drug use: No    Colonoscopy: 2017  PAP:    Bone density: yes, [facility]; osteoporosis Mammography: up to date  Allergies  Allergen Reactions  . Benadryl [Diphenhydramine Hcl (Sleep)] Palpitations    Increased heartrate  . Sulfa Antibiotics Rash    Current Outpatient Medications  Medication Sig Dispense Refill  . albuterol (PROVENTIL HFA;VENTOLIN  HFA) 108 (90 BASE) MCG/ACT inhaler Inhale 2 puffs into the lungs every 6 (six) hours as needed for wheezing or shortness of breath.    Marland Kitchen alendronate (FOSAMAX) 70 MG tablet Take 1 tablet by mouth once a week.    Marland Kitchen atorvastatin (LIPITOR) 10 MG tablet Take 40 mg by mouth daily.     . Cholecalciferol 1000 UNITS capsule Take 500 Units by mouth daily.     . cholestyramine (QUESTRAN) 4 g packet Take 1 packet (4 g total) by mouth 2 (two) times daily. 60 each 1  . clotrimazole-betamethasone (LOTRISONE) cream Apply 1 application topically 2 (two) times daily. 60 g 1  . escitalopram (LEXAPRO) 10 MG tablet Take 10 mg by mouth daily.  3  . famotidine (PEPCID) 40 MG tablet Take 40 mg by mouth daily.    . Fluticasone-Umeclidin-Vilant (TRELEGY ELLIPTA) 100-62.5-25 MCG/INH AEPB Inhale 1  puff into the lungs daily as needed.    . gabapentin (NEURONTIN) 100 MG capsule Take 2 capsules (200 mg total) by mouth at bedtime. 60 capsule 2  . levothyroxine (SYNTHROID, LEVOTHROID) 50 MCG tablet Take 1 tablet by mouth daily.    Marland Kitchen lidocaine-prilocaine (EMLA) cream Apply to affected area once 30 g 3  . LORazepam (ATIVAN) 0.5 MG tablet Take 1 tablet (0.5 mg total) by mouth at bedtime as needed (Nausea or vomiting). 30 tablet 0  . olmesartan (BENICAR) 20 MG tablet Take 20 mg by mouth daily.  11  . ondansetron (ZOFRAN) 8 MG tablet Take 1 tablet (8 mg total) by mouth 2 (two) times daily as needed (Nausea or vomiting). 30 tablet 1  . potassium chloride (KLOR-CON) 10 MEQ tablet TAKE 1 TABLET BY MOUTH EVERY DAY 30 tablet 0   No current facility-administered medications for this visit.   Facility-Administered Medications Ordered in Other Visits  Medication Dose Route Frequency Provider Last Rate Last Admin  . 0.9 %  sodium chloride infusion   Intravenous Continuous Gardenia Phlegm, NP   Stopped at 12/24/18 1548     OBJECTIVE: Middle-aged white woman in no acute distress  Vitals:   01/13/19 1421  BP: 107/63  Pulse: 77  Resp: 18  Temp: 98.5 F (36.9 C)  SpO2: 97%   Wt Readings from Last 3 Encounters:  01/13/19 160 lb 4.8 oz (72.7 kg)  12/24/18 158 lb 14.4 oz (72.1 kg)  12/10/18 166 lb (75.3 kg)   Body mass index is 31.31 kg/m.    ECOG FS:1 - Symptomatic but completely ambulatory  Sclerae unicteric, EOMs intact Wearing a mask No cervical or supraclavicular adenopathy Lungs no rales or rhonchi Heart regular rate and rhythm Abd soft, nontender, positive bowel sounds MSK no focal spinal tenderness, no upper extremity lymphedema Neuro: nonfocal, well oriented, appropriate affect Breasts: The right breast is benign.  The left breast is status post mastectomy.  There is no evidence of chest wall recurrence.  Both axillae are benign.   LAB RESULTS:  CMP     Component  Value Date/Time   NA 136 12/24/2018 1213   K 4.3 12/24/2018 1213   CL 106 12/24/2018 1213   CO2 20 (L) 12/24/2018 1213   GLUCOSE 121 (H) 12/24/2018 1213   BUN 18 12/24/2018 1213   CREATININE 1.72 (H) 12/24/2018 1213   CREATININE 1.11 (H) 07/22/2018 0835   CREATININE 0.97 10/18/2013 1527   CALCIUM 8.6 (L) 12/24/2018 1213   PROT 6.9 12/24/2018 1213   ALBUMIN 2.7 (L) 12/24/2018 1213   AST 48 (H) 12/24/2018 1213  AST 17 07/22/2018 0835   ALT 73 (H) 12/24/2018 1213   ALT 20 07/22/2018 0835   ALKPHOS 179 (H) 12/24/2018 1213   BILITOT 0.9 12/24/2018 1213   BILITOT 0.6 07/22/2018 0835   GFRNONAA 31 (L) 12/24/2018 1213   GFRNONAA 53 (L) 07/22/2018 0835   GFRAA 36 (L) 12/24/2018 1213   GFRAA >60 07/22/2018 0835    No results found for: TOTALPROTELP, ALBUMINELP, A1GS, A2GS, BETS, BETA2SER, GAMS, MSPIKE, SPEI  No results found for: KPAFRELGTCHN, LAMBDASER, KAPLAMBRATIO  Lab Results  Component Value Date   WBC 5.9 01/13/2019   NEUTROABS 3.8 01/13/2019   HGB 9.5 (L) 01/13/2019   HCT 30.9 (L) 01/13/2019   MCV 89.8 01/13/2019   PLT 324 01/13/2019    Lab Results  Component Value Date   LABCA2 24 09/08/2006    No components found for: GLOVFI433  No results for input(s): INR in the last 168 hours.  Lab Results  Component Value Date   LABCA2 24 09/08/2006    No results found for: IRJ188  No results found for: CZY606  No results found for: TKZ601  No results found for: CA2729  No components found for: HGQUANT  No results found for: CEA1 / No results found for: CEA1   No results found for: AFPTUMOR  No results found for: CHROMOGRNA  No results found for: HGBA, HGBA2QUANT, HGBFQUANT, HGBSQUAN (Hemoglobinopathy evaluation)   No results found for: LDH  No results found for: IRON, TIBC, IRONPCTSAT (Iron and TIBC)  No results found for: FERRITIN   Urinalysis No results found for: COLORURINE, APPEARANCEUR, LABSPEC, PHURINE, GLUCOSEU, HGBUR, BILIRUBINUR,  KETONESUR, PROTEINUR, UROBILINOGEN, NITRITE, LEUKOCYTESUR   STUDIES:  ECHOCARDIOGRAM COMPLETE  Result Date: 01/11/2019   ECHOCARDIOGRAM REPORT   Patient Name:   BROOKIE WAYMENT Date of Exam: 01/11/2019 Medical Rec #:  [id number]     Height:       60.0 in Accession #:    [id number]    Weight:       158.9 lb Date of Birth:  September 24, 1955    BSA:          1.69 m Patient Age:    28 years      BP:           114/68 mmHg Patient Gender: F             HR:           83 bpm. Exam Location:  Outpatient Procedure: 2D Echo, Cardiac Doppler, Color Doppler and Strain Analysis Indications:    Z51.11 Encounter for antineoplastic chemotheraphy  History:        Patient has prior history of Echocardiogram examinations, most                 recent 07/27/2018. Angina, COPD; Risk Factors:Hypertension,                 Dyslipidemia and GERD. Breast Cancer. Hypothyroidism.  Sonographer:    Jonelle Sidle Dance Referring Phys: [place]  1. Left ventricular ejection fraction, by visual estimation, is 55 to 60%. The left ventricle has normal function. There is mildly increased left ventricular hypertrophy.  2. The average left ventricular global longitudinal strain is -18.5 %.  3. Left ventricular diastolic parameters are consistent with Grade I diastolic dysfunction (impaired relaxation).  4. The left ventricle has no regional wall motion abnormalities.  5. Global right ventricle has normal systolic function.The right ventricular size is normal. No increase in right ventricular wall  thickness.  6. Left atrial size was normal.  7. Right atrial size was normal.  8. The mitral valve is normal in structure. Trivial mitral valve regurgitation. No evidence of mitral stenosis.  9. The tricuspid valve is normal in structure. Tricuspid valve regurgitation is trivial. 10. The aortic valve is normal in structure. Aortic valve regurgitation is not visualized. No evidence of aortic valve sclerosis or stenosis. 11. The pulmonic valve was  normal in structure. Pulmonic valve regurgitation is trivial. 12. The inferior vena cava is normal in size with greater than 50% respiratory variability, suggesting right atrial pressure of 3 mmHg. 13. TR signal is inadequate for assessing pulmonary artery systolic pressure. FINDINGS  Left Ventricle: Left ventricular ejection fraction, by visual estimation, is 55 to 60%. The left ventricle has normal function. The average left ventricular global longitudinal strain is -18.5 %. The left ventricle has no regional wall motion abnormalities. There is mildly increased left ventricular hypertrophy. Left ventricular diastolic parameters are consistent with Grade I diastolic dysfunction (impaired relaxation). Normal left atrial pressure. Right Ventricle: The right ventricular size is normal. No increase in right ventricular wall thickness. Global RV systolic function is has normal systolic function. Left Atrium: Left atrial size was normal in size. Right Atrium: Right atrial size was normal in size Pericardium: There is no evidence of pericardial effusion. Mitral Valve: The mitral valve is normal in structure. Trivial mitral valve regurgitation. No evidence of mitral valve stenosis by observation. Tricuspid Valve: The tricuspid valve is normal in structure. Tricuspid valve regurgitation is trivial. Aortic Valve: The aortic valve is normal in structure. Aortic valve regurgitation is not visualized. The aortic valve is structurally normal, with no evidence of sclerosis or stenosis. Pulmonic Valve: The pulmonic valve was normal in structure. Pulmonic valve regurgitation is trivial. Pulmonic regurgitation is trivial. Aorta: The aortic root, ascending aorta and aortic arch are all structurally normal, with no evidence of dilitation or obstruction. Venous: The inferior vena cava is normal in size with greater than 50% respiratory variability, suggesting right atrial pressure of 3 mmHg. IAS/Shunts: No atrial level shunt detected  by color flow Doppler. There is no evidence of a patent foramen ovale. No ventricular septal defect is seen or detected. There is no evidence of an atrial septal defect.  LEFT VENTRICLE PLAX 2D LVIDd:         4.00 cm  Diastology LVIDs:         3.10 cm  LV e' lateral:   8.26 cm/s LV PW:         1.20 cm  LV E/e' lateral: 6.9 LV IVS:        1.10 cm  LV e' medial:    6.96 cm/s LVOT diam:     1.80 cm  LV E/e' medial:  8.2 LV SV:         32 ml LV SV Index:   18.07    2D Longitudinal Strain LVOT Area:     2.54 cm 2D Strain GLS Avg:     -18.5 %  RIGHT VENTRICLE             IVC RV Basal diam:  2.00 cm     IVC diam: 1.40 cm RV S prime:     10.80 cm/s TAPSE (M-mode): 1.5 cm LEFT ATRIUM             Index       RIGHT ATRIUM          Index LA diam:  3.90 cm 2.30 cm/m  RA Area:     8.85 cm LA Vol (A2C):   33.1 ml 19.55 ml/m RA Volume:   16.10 ml 9.51 ml/m LA Vol (A4C):   28.6 ml 16.90 ml/m LA Biplane Vol: 32.6 ml 19.26 ml/m  AORTIC VALVE LVOT Vmax:   112.00 cm/s LVOT Vmean:  68.500 cm/s LVOT VTI:    0.197 m  AORTA Ao Root diam: 3.40 cm Ao Asc diam:  3.00 cm MITRAL VALVE MV Area (PHT): 3.99 cm             SHUNTS MV PHT:        55.10 msec           Systemic VTI:  0.20 m MV Decel Time: 190 msec             Systemic Diam: 1.80 cm MV E velocity: 56.80 cm/s 103 cm/s MV A velocity: 88.60 cm/s 70.3 cm/s MV E/A ratio:  0.64       1.5  Cherlynn Kaiser MD Electronically signed by Cherlynn Kaiser MD Signature Date/Time: 01/11/2019/3:01:35 PM    Final      ELIGIBLE FOR AVAILABLE RESEARCH PROTOCOL: no   ASSESSMENT: 63 y.o. [place], [place] woman status post left breast upper outer quadrant biopsy 07/13/2018 for a clinical T1c N0, stage IA invasive ductal carcinoma, grade 3, estrogen and progesterone receptor negative, HER-2 amplified, with an MIB-1 of 70%  (1) status post left mastectomy with sentinel lymph node sampling 09/16/2018 for a pT2, pN0, stage IIA invasive ductal carcinoma, grade 3, with negative margins  (a) a  total of 6 sentinel lymph nodes were removed  (b) the patient is not planning on reconstruction  (2) adjuvant chemotherapy with paclitaxel and trastuzumab weekly x12 started on 10/21/2018  (a) paclitaxel discontinued after 8 cycles due to peripheral neuropathy.  (3) trastuzumab to be continued every 21 days to total 1 year  (a) echo 07/27/2018 shows an ejection fraction in the 55-60% range  (b) echo 01/11/2019 shows an ejection fraction in the 55-60% range  (4) no indication for postmastectomy radiation  (5) genetic testing on 07/22/2018 through the [facility] Common Hereditary cancer panel found no pathogenic mutations.in APC, ATM, AXIN2, BARD1, BMPR1A, BRCA1, BRCA2, BRIP1, CDH1, CDKN2A (p14ARF), CDKN2A (p16INK4a), CKD4, CHEK2, CTNNA1, DICER1, EPCAM (Deletion/duplication testing only), GREM1 (promoter region deletion/duplication testing only), KIT, MEN1, MLH1, MSH2, MSH3, MSH6, MUTYH, NBN, NF1, NHTL1, PALB2, PDGFRA, PMS2, POLD1, POLE, PTEN, RAD50, RAD51C, RAD51D, SDHB, SDHC, SDHD, SMAD4, SMARCA4. STK11, TP53, TSC1, TSC2, and VHL.  The following genes were evaluated for sequence changes only: SDHA and HOXB13 c.251G>A variant only.   PLAN: Dorothy Frederick is tolerating the trastuzumab well and her echocardiogram shows an excellent ejection fraction.  The plan will be to continue that every 21 days through September 2021.  I am pleased that the peripheral neuropathy is improved but it really remains significant.  I am hoping that it will continue to fade away over the next several months.  We did talk about fall precautions today.  She will have her next echocardiogram in 3 months.  Accordingly I will see her late March early April.  I encouraged her to receive the COVID-19 vaccine as soon as it becomes available to her.  She knows to call for any other issue that may develop before that visit.  Virgie Dad. Beck Cofer, MD  01/13/19 2:29 PM Medical Oncology and Hematology [facility] [place], [place] [address] Tel. [phone]  Fax. [phone]   I, Wilburn Mylar, am acting as scribe for Dr. Virgie Dad. Kayla Deshaies.  I, Lurline Del MD, have reviewed the above documentation for accuracy and completeness, and I agree with the above.

## 2019-01-13 ENCOUNTER — Inpatient Hospital Stay: Payer: BC Managed Care – PPO

## 2019-01-13 ENCOUNTER — Inpatient Hospital Stay (HOSPITAL_BASED_OUTPATIENT_CLINIC_OR_DEPARTMENT_OTHER): Payer: BC Managed Care – PPO | Admitting: Oncology

## 2019-01-13 ENCOUNTER — Other Ambulatory Visit: Payer: Self-pay | Admitting: Adult Health

## 2019-01-13 ENCOUNTER — Other Ambulatory Visit: Payer: Self-pay

## 2019-01-13 VITALS — BP 107/63 | HR 77 | Temp 98.5°F | Resp 18 | Ht 60.0 in | Wt 160.3 lb

## 2019-01-13 DIAGNOSIS — C50412 Malignant neoplasm of upper-outer quadrant of left female breast: Secondary | ICD-10-CM

## 2019-01-13 DIAGNOSIS — I158 Other secondary hypertension: Secondary | ICD-10-CM

## 2019-01-13 DIAGNOSIS — Z171 Estrogen receptor negative status [ER-]: Secondary | ICD-10-CM | POA: Diagnosis not present

## 2019-01-13 DIAGNOSIS — Z9012 Acquired absence of left breast and nipple: Secondary | ICD-10-CM | POA: Diagnosis not present

## 2019-01-13 DIAGNOSIS — Z5111 Encounter for antineoplastic chemotherapy: Secondary | ICD-10-CM | POA: Diagnosis not present

## 2019-01-13 DIAGNOSIS — I129 Hypertensive chronic kidney disease with stage 1 through stage 4 chronic kidney disease, or unspecified chronic kidney disease: Secondary | ICD-10-CM | POA: Diagnosis not present

## 2019-01-13 DIAGNOSIS — Z95828 Presence of other vascular implants and grafts: Secondary | ICD-10-CM

## 2019-01-13 DIAGNOSIS — Z79899 Other long term (current) drug therapy: Secondary | ICD-10-CM | POA: Diagnosis not present

## 2019-01-13 DIAGNOSIS — F17210 Nicotine dependence, cigarettes, uncomplicated: Secondary | ICD-10-CM | POA: Diagnosis not present

## 2019-01-13 DIAGNOSIS — G629 Polyneuropathy, unspecified: Secondary | ICD-10-CM | POA: Diagnosis not present

## 2019-01-13 DIAGNOSIS — E876 Hypokalemia: Secondary | ICD-10-CM | POA: Diagnosis not present

## 2019-01-13 DIAGNOSIS — N184 Chronic kidney disease, stage 4 (severe): Secondary | ICD-10-CM | POA: Diagnosis not present

## 2019-01-13 LAB — COMPREHENSIVE METABOLIC PANEL
ALT: 13 U/L (ref 0–44)
AST: 16 U/L (ref 15–41)
Albumin: 3 g/dL — ABNORMAL LOW (ref 3.5–5.0)
Alkaline Phosphatase: 117 U/L (ref 38–126)
Anion gap: 11 (ref 5–15)
BUN: 12 mg/dL (ref 8–23)
CO2: 23 mmol/L (ref 22–32)
Calcium: 8.4 mg/dL — ABNORMAL LOW (ref 8.9–10.3)
Chloride: 107 mmol/L (ref 98–111)
Creatinine, Ser: 0.9 mg/dL (ref 0.44–1.00)
GFR calc Af Amer: >60 mL/min (ref >60)
GFR calc non Af Amer: >60 mL/min (ref >60)
Glucose, Bld: 86 mg/dL (ref 70–99)
Potassium: 4 mmol/L (ref 3.5–5.1)
Sodium: 141 mmol/L (ref 135–145)
Total Bilirubin: 0.7 mg/dL (ref 0.3–1.2)
Total Protein: 7 g/dL (ref 6.5–8.1)

## 2019-01-13 LAB — CBC WITH DIFFERENTIAL/PLATELET
Abs Immature Granulocytes: 0.03 10*3/uL (ref 0.00–0.07)
Basophils Absolute: 0 10*3/uL (ref 0.0–0.1)
Basophils Relative: 1 %
Eosinophils Absolute: 0.1 10*3/uL (ref 0.0–0.5)
Eosinophils Relative: 2 %
HCT: 30.9 % — ABNORMAL LOW (ref 36.0–46.0)
Hemoglobin: 9.5 g/dL — ABNORMAL LOW (ref 12.0–15.0)
Immature Granulocytes: 1 %
Lymphocytes Relative: 19 %
Lymphs Abs: 1.1 10*3/uL (ref 0.7–4.0)
MCH: 27.6 pg (ref 26.0–34.0)
MCHC: 30.7 g/dL (ref 30.0–36.0)
MCV: 89.8 fL (ref 80.0–100.0)
Monocytes Absolute: 0.7 10*3/uL (ref 0.1–1.0)
Monocytes Relative: 12 %
Neutro Abs: 3.8 10*3/uL (ref 1.7–7.7)
Neutrophils Relative %: 65 %
Platelets: 324 10*3/uL (ref 150–400)
RBC: 3.44 MIL/uL — ABNORMAL LOW (ref 3.87–5.11)
RDW: 19.8 % — ABNORMAL HIGH (ref 11.5–15.5)
WBC: 5.9 10*3/uL (ref 4.0–10.5)
nRBC: 0 % (ref 0.0–0.2)

## 2019-01-13 MED ORDER — DIPHENHYDRAMINE HCL 25 MG PO CAPS
ORAL_CAPSULE | ORAL | Status: AC
  Filled 2019-01-13: qty 2

## 2019-01-13 MED ORDER — TRASTUZUMAB-DKST CHEMO 150 MG IV SOLR
450.0000 mg | Freq: Once | INTRAVENOUS | Status: AC
  Administered 2019-01-13: 450 mg via INTRAVENOUS
  Filled 2019-01-13: qty 21.43

## 2019-01-13 MED ORDER — ACETAMINOPHEN 325 MG PO TABS
ORAL_TABLET | ORAL | Status: AC
  Filled 2019-01-13: qty 2

## 2019-01-13 MED ORDER — SODIUM CHLORIDE 0.9 % IV SOLN
Freq: Once | INTRAVENOUS | Status: AC
  Filled 2019-01-13: qty 250

## 2019-01-13 MED ORDER — HEPARIN SOD (PORK) LOCK FLUSH 100 UNIT/ML IV SOLN
500.0000 [IU] | Freq: Once | INTRAVENOUS | Status: AC | PRN
  Administered 2019-01-13: 500 [IU]
  Filled 2019-01-13: qty 5

## 2019-01-13 MED ORDER — SODIUM CHLORIDE 0.9% FLUSH
10.0000 mL | INTRAVENOUS | Status: DC | PRN
  Administered 2019-01-13: 10 mL
  Filled 2019-01-13: qty 10

## 2019-01-13 MED ORDER — ACETAMINOPHEN 325 MG PO TABS
650.0000 mg | ORAL_TABLET | Freq: Once | ORAL | Status: AC
  Administered 2019-01-13: 650 mg via ORAL

## 2019-01-13 MED ORDER — DIPHENHYDRAMINE HCL 25 MG PO CAPS
25.0000 mg | ORAL_CAPSULE | Freq: Once | ORAL | Status: AC
  Administered 2019-01-13: 25 mg via ORAL

## 2019-01-13 NOTE — Patient Instructions (Signed)
[place] Discharge Instructions for Patients Receiving Chemotherapy  Today you received the following chemotherapy agents OGIVRI  To help prevent nausea and vomiting after your treatment, we encourage you to take your nausea medication.   If you develop nausea and vomiting that is not controlled by your nausea medication, call the clinic.   BELOW ARE SYMPTOMS THAT SHOULD BE REPORTED IMMEDIATELY:  *FEVER GREATER THAN 100.5 F  *CHILLS WITH OR WITHOUT FEVER  NAUSEA AND VOMITING THAT IS NOT CONTROLLED WITH YOUR NAUSEA MEDICATION  *UNUSUAL SHORTNESS OF BREATH  *UNUSUAL BRUISING OR BLEEDING  TENDERNESS IN MOUTH AND THROAT WITH OR WITHOUT PRESENCE OF ULCERS  *URINARY PROBLEMS  *BOWEL PROBLEMS  UNUSUAL RASH Items with * indicate a potential emergency and should be followed up as soon as possible.  Feel free to call the clinic should you have any questions or concerns. The clinic phone number is [phone].  Please show the [place] at check-in to the Emergency Department and triage nurse.

## 2019-01-13 NOTE — Addendum Note (Signed)
Addended by: Chauncey Cruel on: 01/13/2019 03:33 PM   Modules accepted: Orders

## 2019-01-20 DIAGNOSIS — C50412 Malignant neoplasm of upper-outer quadrant of left female breast: Secondary | ICD-10-CM | POA: Diagnosis not present

## 2019-01-21 ENCOUNTER — Encounter: Payer: Self-pay | Admitting: *Deleted

## 2019-01-26 ENCOUNTER — Encounter: Payer: Self-pay | Admitting: *Deleted

## 2019-02-03 ENCOUNTER — Inpatient Hospital Stay: Payer: BC Managed Care – PPO

## 2019-02-03 ENCOUNTER — Inpatient Hospital Stay: Payer: BC Managed Care – PPO | Attending: Oncology

## 2019-02-03 ENCOUNTER — Other Ambulatory Visit: Payer: Self-pay

## 2019-02-03 VITALS — BP 119/56 | HR 79 | Temp 99.1°F | Resp 18

## 2019-02-03 DIAGNOSIS — Z171 Estrogen receptor negative status [ER-]: Secondary | ICD-10-CM | POA: Insufficient documentation

## 2019-02-03 DIAGNOSIS — F17210 Nicotine dependence, cigarettes, uncomplicated: Secondary | ICD-10-CM | POA: Insufficient documentation

## 2019-02-03 DIAGNOSIS — E876 Hypokalemia: Secondary | ICD-10-CM | POA: Insufficient documentation

## 2019-02-03 DIAGNOSIS — N184 Chronic kidney disease, stage 4 (severe): Secondary | ICD-10-CM | POA: Diagnosis not present

## 2019-02-03 DIAGNOSIS — Z79899 Other long term (current) drug therapy: Secondary | ICD-10-CM | POA: Insufficient documentation

## 2019-02-03 DIAGNOSIS — C50412 Malignant neoplasm of upper-outer quadrant of left female breast: Secondary | ICD-10-CM

## 2019-02-03 DIAGNOSIS — Z9012 Acquired absence of left breast and nipple: Secondary | ICD-10-CM | POA: Insufficient documentation

## 2019-02-03 DIAGNOSIS — I129 Hypertensive chronic kidney disease with stage 1 through stage 4 chronic kidney disease, or unspecified chronic kidney disease: Secondary | ICD-10-CM | POA: Diagnosis not present

## 2019-02-03 DIAGNOSIS — Z5111 Encounter for antineoplastic chemotherapy: Secondary | ICD-10-CM | POA: Insufficient documentation

## 2019-02-03 DIAGNOSIS — G629 Polyneuropathy, unspecified: Secondary | ICD-10-CM | POA: Diagnosis not present

## 2019-02-03 DIAGNOSIS — Z95828 Presence of other vascular implants and grafts: Secondary | ICD-10-CM

## 2019-02-03 DIAGNOSIS — I158 Other secondary hypertension: Secondary | ICD-10-CM

## 2019-02-03 LAB — CBC WITH DIFFERENTIAL/PLATELET
Abs Immature Granulocytes: 0.03 10*3/uL (ref 0.00–0.07)
Basophils Absolute: 0 10*3/uL (ref 0.0–0.1)
Basophils Relative: 1 %
Eosinophils Absolute: 0.1 10*3/uL (ref 0.0–0.5)
Eosinophils Relative: 1 %
HCT: 34.2 % — ABNORMAL LOW (ref 36.0–46.0)
Hemoglobin: 10.5 g/dL — ABNORMAL LOW (ref 12.0–15.0)
Immature Granulocytes: 0 %
Lymphocytes Relative: 14 %
Lymphs Abs: 1 10*3/uL (ref 0.7–4.0)
MCH: 27.5 pg (ref 26.0–34.0)
MCHC: 30.7 g/dL (ref 30.0–36.0)
MCV: 89.5 fL (ref 80.0–100.0)
Monocytes Absolute: 0.8 10*3/uL (ref 0.1–1.0)
Monocytes Relative: 12 %
Neutro Abs: 4.9 10*3/uL (ref 1.7–7.7)
Neutrophils Relative %: 72 %
Platelets: 347 10*3/uL (ref 150–400)
RBC: 3.82 MIL/uL — ABNORMAL LOW (ref 3.87–5.11)
RDW: 18.1 % — ABNORMAL HIGH (ref 11.5–15.5)
WBC: 6.8 10*3/uL (ref 4.0–10.5)
nRBC: 0 % (ref 0.0–0.2)

## 2019-02-03 LAB — COMPREHENSIVE METABOLIC PANEL
ALT: 9 U/L (ref 0–44)
AST: 11 U/L — ABNORMAL LOW (ref 15–41)
Albumin: 3.3 g/dL — ABNORMAL LOW (ref 3.5–5.0)
Alkaline Phosphatase: 116 U/L (ref 38–126)
Anion gap: 12 (ref 5–15)
BUN: 13 mg/dL (ref 8–23)
CO2: 22 mmol/L (ref 22–32)
Calcium: 8.7 mg/dL — ABNORMAL LOW (ref 8.9–10.3)
Chloride: 107 mmol/L (ref 98–111)
Creatinine, Ser: 0.89 mg/dL (ref 0.44–1.00)
GFR calc Af Amer: >60 mL/min (ref >60)
GFR calc non Af Amer: >60 mL/min (ref >60)
Glucose, Bld: 98 mg/dL (ref 70–99)
Potassium: 3.9 mmol/L (ref 3.5–5.1)
Sodium: 141 mmol/L (ref 135–145)
Total Bilirubin: 0.8 mg/dL (ref 0.3–1.2)
Total Protein: 7.6 g/dL (ref 6.5–8.1)

## 2019-02-03 MED ORDER — ALTEPLASE 2 MG IJ SOLR
2.0000 mg | Freq: Once | INTRAMUSCULAR | Status: AC | PRN
  Administered 2019-02-03: 2 mg
  Filled 2019-02-03: qty 2

## 2019-02-03 MED ORDER — SODIUM CHLORIDE 0.9% FLUSH
10.0000 mL | INTRAVENOUS | Status: DC | PRN
  Administered 2019-02-03: 10 mL
  Filled 2019-02-03: qty 10

## 2019-02-03 MED ORDER — SODIUM CHLORIDE 0.9 % IV SOLN
Freq: Once | INTRAVENOUS | Status: AC
  Filled 2019-02-03: qty 250

## 2019-02-03 MED ORDER — TRASTUZUMAB-DKST CHEMO 150 MG IV SOLR
450.0000 mg | Freq: Once | INTRAVENOUS | Status: AC
  Administered 2019-02-03: 450 mg via INTRAVENOUS
  Filled 2019-02-03: qty 21.43

## 2019-02-03 MED ORDER — ACETAMINOPHEN 325 MG PO TABS
ORAL_TABLET | ORAL | Status: AC
  Filled 2019-02-03: qty 2

## 2019-02-03 MED ORDER — ALTEPLASE 2 MG IJ SOLR
INTRAMUSCULAR | Status: AC
  Filled 2019-02-03: qty 2

## 2019-02-03 MED ORDER — ACETAMINOPHEN 325 MG PO TABS
650.0000 mg | ORAL_TABLET | Freq: Once | ORAL | Status: AC
  Administered 2019-02-03: 650 mg via ORAL

## 2019-02-03 MED ORDER — DIPHENHYDRAMINE HCL 25 MG PO CAPS
25.0000 mg | ORAL_CAPSULE | Freq: Once | ORAL | Status: AC
  Administered 2019-02-03: 25 mg via ORAL

## 2019-02-03 MED ORDER — HEPARIN SOD (PORK) LOCK FLUSH 100 UNIT/ML IV SOLN
500.0000 [IU] | Freq: Once | INTRAVENOUS | Status: AC | PRN
  Administered 2019-02-03: 500 [IU]
  Filled 2019-02-03: qty 5

## 2019-02-03 MED ORDER — DIPHENHYDRAMINE HCL 25 MG PO CAPS
ORAL_CAPSULE | ORAL | Status: AC
  Filled 2019-02-03: qty 1

## 2019-02-03 NOTE — Patient Instructions (Signed)
[place] Discharge Instructions for Patients Receiving Chemotherapy  Today you received the following chemotherapy agents OGIVRI  To help prevent nausea and vomiting after your treatment, we encourage you to take your nausea medication.   If you develop nausea and vomiting that is not controlled by your nausea medication, call the clinic.   BELOW ARE SYMPTOMS THAT SHOULD BE REPORTED IMMEDIATELY:  *FEVER GREATER THAN 100.5 F  *CHILLS WITH OR WITHOUT FEVER  NAUSEA AND VOMITING THAT IS NOT CONTROLLED WITH YOUR NAUSEA MEDICATION  *UNUSUAL SHORTNESS OF BREATH  *UNUSUAL BRUISING OR BLEEDING  TENDERNESS IN MOUTH AND THROAT WITH OR WITHOUT PRESENCE OF ULCERS  *URINARY PROBLEMS  *BOWEL PROBLEMS  UNUSUAL RASH Items with * indicate a potential emergency and should be followed up as soon as possible.  Feel free to call the clinic should you have any questions or concerns. The clinic phone number is [phone].  Please show the [place] at check-in to the Emergency Department and triage nurse.

## 2019-02-16 DIAGNOSIS — L7634 Postprocedural seroma of skin and subcutaneous tissue following other procedure: Secondary | ICD-10-CM | POA: Diagnosis not present

## 2019-02-23 DIAGNOSIS — L7634 Postprocedural seroma of skin and subcutaneous tissue following other procedure: Secondary | ICD-10-CM | POA: Diagnosis not present

## 2019-02-24 ENCOUNTER — Other Ambulatory Visit: Payer: Self-pay

## 2019-02-24 ENCOUNTER — Inpatient Hospital Stay: Payer: BC Managed Care – PPO

## 2019-02-24 ENCOUNTER — Inpatient Hospital Stay: Payer: BC Managed Care – PPO | Attending: Oncology

## 2019-02-24 VITALS — BP 142/56 | HR 64 | Temp 98.3°F | Resp 18

## 2019-02-24 DIAGNOSIS — I129 Hypertensive chronic kidney disease with stage 1 through stage 4 chronic kidney disease, or unspecified chronic kidney disease: Secondary | ICD-10-CM | POA: Insufficient documentation

## 2019-02-24 DIAGNOSIS — G629 Polyneuropathy, unspecified: Secondary | ICD-10-CM | POA: Insufficient documentation

## 2019-02-24 DIAGNOSIS — Z95828 Presence of other vascular implants and grafts: Secondary | ICD-10-CM

## 2019-02-24 DIAGNOSIS — F17210 Nicotine dependence, cigarettes, uncomplicated: Secondary | ICD-10-CM | POA: Diagnosis not present

## 2019-02-24 DIAGNOSIS — E876 Hypokalemia: Secondary | ICD-10-CM | POA: Diagnosis not present

## 2019-02-24 DIAGNOSIS — C50412 Malignant neoplasm of upper-outer quadrant of left female breast: Secondary | ICD-10-CM | POA: Insufficient documentation

## 2019-02-24 DIAGNOSIS — N184 Chronic kidney disease, stage 4 (severe): Secondary | ICD-10-CM | POA: Diagnosis not present

## 2019-02-24 DIAGNOSIS — Z9012 Acquired absence of left breast and nipple: Secondary | ICD-10-CM | POA: Diagnosis not present

## 2019-02-24 DIAGNOSIS — Z5111 Encounter for antineoplastic chemotherapy: Secondary | ICD-10-CM | POA: Insufficient documentation

## 2019-02-24 DIAGNOSIS — Z171 Estrogen receptor negative status [ER-]: Secondary | ICD-10-CM | POA: Diagnosis not present

## 2019-02-24 DIAGNOSIS — Z79899 Other long term (current) drug therapy: Secondary | ICD-10-CM | POA: Diagnosis not present

## 2019-02-24 DIAGNOSIS — I158 Other secondary hypertension: Secondary | ICD-10-CM

## 2019-02-24 LAB — CBC WITH DIFFERENTIAL/PLATELET
Abs Immature Granulocytes: 0.03 10*3/uL (ref 0.00–0.07)
Basophils Absolute: 0 10*3/uL (ref 0.0–0.1)
Basophils Relative: 1 %
Eosinophils Absolute: 0.1 10*3/uL (ref 0.0–0.5)
Eosinophils Relative: 2 %
HCT: 33.3 % — ABNORMAL LOW (ref 36.0–46.0)
Hemoglobin: 10.3 g/dL — ABNORMAL LOW (ref 12.0–15.0)
Immature Granulocytes: 1 %
Lymphocytes Relative: 26 %
Lymphs Abs: 1.4 10*3/uL (ref 0.7–4.0)
MCH: 27.4 pg (ref 26.0–34.0)
MCHC: 30.9 g/dL (ref 30.0–36.0)
MCV: 88.6 fL (ref 80.0–100.0)
Monocytes Absolute: 0.5 10*3/uL (ref 0.1–1.0)
Monocytes Relative: 9 %
Neutro Abs: 3.5 10*3/uL (ref 1.7–7.7)
Neutrophils Relative %: 61 %
Platelets: 386 10*3/uL (ref 150–400)
RBC: 3.76 MIL/uL — ABNORMAL LOW (ref 3.87–5.11)
RDW: 16.5 % — ABNORMAL HIGH (ref 11.5–15.5)
WBC: 5.6 10*3/uL (ref 4.0–10.5)
nRBC: 0 % (ref 0.0–0.2)

## 2019-02-24 LAB — COMPREHENSIVE METABOLIC PANEL
ALT: 15 U/L (ref 0–44)
AST: 14 U/L — ABNORMAL LOW (ref 15–41)
Albumin: 3.4 g/dL — ABNORMAL LOW (ref 3.5–5.0)
Alkaline Phosphatase: 112 U/L (ref 38–126)
Anion gap: 11 (ref 5–15)
BUN: 13 mg/dL (ref 8–23)
CO2: 24 mmol/L (ref 22–32)
Calcium: 8.8 mg/dL — ABNORMAL LOW (ref 8.9–10.3)
Chloride: 108 mmol/L (ref 98–111)
Creatinine, Ser: 0.84 mg/dL (ref 0.44–1.00)
GFR calc Af Amer: >60 mL/min (ref >60)
GFR calc non Af Amer: >60 mL/min (ref >60)
Glucose, Bld: 92 mg/dL (ref 70–99)
Potassium: 3.7 mmol/L (ref 3.5–5.1)
Sodium: 143 mmol/L (ref 135–145)
Total Bilirubin: 0.6 mg/dL (ref 0.3–1.2)
Total Protein: 7.3 g/dL (ref 6.5–8.1)

## 2019-02-24 MED ORDER — DIPHENHYDRAMINE HCL 25 MG PO CAPS
ORAL_CAPSULE | ORAL | Status: AC
  Filled 2019-02-24: qty 1

## 2019-02-24 MED ORDER — HEPARIN SOD (PORK) LOCK FLUSH 100 UNIT/ML IV SOLN
500.0000 [IU] | Freq: Once | INTRAVENOUS | Status: AC | PRN
  Administered 2019-02-24: 500 [IU]
  Filled 2019-02-24: qty 5

## 2019-02-24 MED ORDER — SODIUM CHLORIDE 0.9% FLUSH
10.0000 mL | INTRAVENOUS | Status: DC | PRN
  Administered 2019-02-24: 14:00:00 10 mL
  Filled 2019-02-24: qty 10

## 2019-02-24 MED ORDER — TRASTUZUMAB-DKST CHEMO 150 MG IV SOLR
450.0000 mg | Freq: Once | INTRAVENOUS | Status: AC
  Administered 2019-02-24: 450 mg via INTRAVENOUS
  Filled 2019-02-24: qty 21.43

## 2019-02-24 MED ORDER — ACETAMINOPHEN 325 MG PO TABS
650.0000 mg | ORAL_TABLET | Freq: Once | ORAL | Status: AC
  Administered 2019-02-24: 650 mg via ORAL

## 2019-02-24 MED ORDER — DIPHENHYDRAMINE HCL 25 MG PO CAPS
25.0000 mg | ORAL_CAPSULE | Freq: Once | ORAL | Status: AC
  Administered 2019-02-24: 25 mg via ORAL

## 2019-02-24 MED ORDER — SODIUM CHLORIDE 0.9% FLUSH
10.0000 mL | INTRAVENOUS | Status: DC | PRN
  Administered 2019-02-24: 16:00:00 10 mL
  Filled 2019-02-24: qty 10

## 2019-02-24 MED ORDER — ACETAMINOPHEN 325 MG PO TABS
ORAL_TABLET | ORAL | Status: AC
  Filled 2019-02-24: qty 2

## 2019-02-24 MED ORDER — SODIUM CHLORIDE 0.9 % IV SOLN
Freq: Once | INTRAVENOUS | Status: AC
  Filled 2019-02-24: qty 250

## 2019-02-24 NOTE — Patient Instructions (Signed)
[place] Discharge Instructions for Patients Receiving Chemotherapy  Today you received the following chemotherapy agent: OGIVRI  To help prevent nausea and vomiting after your treatment, we encourage you to take your nausea medication.   If you develop nausea and vomiting that is not controlled by your nausea medication, call the clinic.   BELOW ARE SYMPTOMS THAT SHOULD BE REPORTED IMMEDIATELY:  *FEVER GREATER THAN 100.5 F  *CHILLS WITH OR WITHOUT FEVER  NAUSEA AND VOMITING THAT IS NOT CONTROLLED WITH YOUR NAUSEA MEDICATION  *UNUSUAL SHORTNESS OF BREATH  *UNUSUAL BRUISING OR BLEEDING  TENDERNESS IN MOUTH AND THROAT WITH OR WITHOUT PRESENCE OF ULCERS  *URINARY PROBLEMS  *BOWEL PROBLEMS  UNUSUAL RASH Items with * indicate a potential emergency and should be followed up as soon as possible.  Feel free to call the clinic should you have any questions or concerns. The clinic phone number is [phone].  Please show the [place] at check-in to the Emergency Department and triage nurse.

## 2019-02-24 NOTE — Patient Instructions (Signed)

## 2019-03-09 ENCOUNTER — Other Ambulatory Visit: Payer: Self-pay

## 2019-03-09 ENCOUNTER — Telehealth: Payer: Self-pay | Admitting: *Deleted

## 2019-03-09 ENCOUNTER — Ambulatory Visit (INDEPENDENT_AMBULATORY_CARE_PROVIDER_SITE_OTHER): Payer: BC Managed Care – PPO | Admitting: Internal Medicine

## 2019-03-09 ENCOUNTER — Encounter: Payer: Self-pay | Admitting: Internal Medicine

## 2019-03-09 VITALS — BP 128/74 | HR 82 | Temp 97.1°F | Ht 63.5 in | Wt 157.0 lb

## 2019-03-09 DIAGNOSIS — S21102A Unspecified open wound of left front wall of thorax without penetration into thoracic cavity, initial encounter: Secondary | ICD-10-CM

## 2019-03-09 NOTE — Progress Notes (Signed)
Subjective:     Patient ID: Dorothy Frederick, female    DOB: 1955-08-23, 64 y.o.   MRN: [id number]  Chief Complaint  Patient presents with  . Advice Only    for infected seroma on (B) breast    HPI: The patient is a 64 y.o. female here for evaluation of left chest wall wound s/p left mastectomy from invasive ductal carcinoma.  Patient presents with a difficult to heal left breast wound for the past month.  She says the wound is currently open and has not been able to close.  She has been on 1 round of oral doxycycline for an infection to the area in the past month.  She ended the antibiotics about 2 weeks ago and states the infection improved.  She denies any pain to the area.  She has had some mild drainage however denies any purulent drainage.  She has been using wet-to-dry dressings twice daily.  She denies any fever/chills, nausea/vomiting, or hot area to the wound.  Review of Systems  All other systems reviewed and are negative.   has a past medical history of Angina at rest [facility]), Anxiety, Cancer ([place]) (2000), COPD (chronic obstructive pulmonary disease) ([place]), Depression, Family history of breast cancer, Family history of esophageal cancer, Family history of lung cancer, Family history of prostate cancer, GERD (gastroesophageal reflux disease), Hyperlipidemia, Hypertension, Hypothyroidism, and Recurrent breast cancer, left ([place]).  has a past surgical history that includes Mastectomy, partial (Left, 2000); left heart catheterization with coronary angiogram (N/A, 10/21/2013); Tonsillectomy; Adenoidectomy; Mastectomy w/ sentinel node biopsy (Left, 09/16/2018); and Portacath placement (Right, 09/16/2018).  reports that she has quit smoking. Her smoking use included cigarettes. She has a 38.00 pack-year smoking history. She has never used smokeless tobacco. Objective:   Vital Signs BP 128/74 (BP Location: Left Arm, Patient Position: Sitting, Cuff Size: Normal)   Pulse 82   Temp (!) 97.1 F  (36.2 C) (Temporal)   Ht 5' 3.5" (1.613 m)   Wt 157 lb (71.2 kg)   SpO2 98%   BMI 27.38 kg/m  Vital Signs and Nursing Note Reviewed  Physical Exam  Skin: Skin is warm and dry.  Mild erythematous margins No purulent drainage Minimal clear fluid released with q-tip probe   wound measures 2.5cm x 2.0cm x 1.0cm       Assessment/Plan:     ICD-10-CM   1. Open wound of chest wall with complication, left, initial encounter  S21.102A     Assessment: Open wound to left chest wall s/p mastectomy following diagnosis of breast cancer Patient has a history of breast cancer in 2000 and at that time had a partial mastectomy and radiation.  Re-occurance of the breast cancer occurred in 2020.  She was diagnosed with T1c N0, stage IA invasive ductal carcinoma, grade 3, estrogen and progesterone receptor negative, HER-2 amplified, with an MIB-1 of 70%.  She had a left mastectomy in August 2020 and has had seroma formation to the left chest post mastectomy  requiring drainage.  She is currently receiving trastuzumab and plan is to continue chemotherapy through September 2021.    Today patient presents with an open wound that has had difficulty closing in the past month.  Patient was referred by Dr. Ninfa Linden and his notes were reviewed.  On exam wound does not appear infected.  Area is hard due to previous radiation treatment.  Dr. Marla Roe was present for the encounter.  At this time recommendations include surgically releasing the scarred tissue in hopes  to close the wound.  Patient was agreeable with this option and would like to try this intervention.  Latissimus flap was also discussed but patient would like to consider this as a future option if wound did not heal.  In the meantime we will order alginate dressing for the patient to use daily.  A Vaseline dressing was done in office.  Plan -Surgical release of left chest wall for wound closure -Alginate dressing daily -Before alginate dressing  products arrive use wet to dry dressings -Vaseline dressing placed in the office -Patient was informed to call with any questions or concerns   Boyd Kerbs, DO 03/09/2019, 10:48 AM

## 2019-03-09 NOTE — Telephone Encounter (Addendum)
Faxed order to [facility] for supplies for the patient.   Confirmation received.//AB/CMA

## 2019-03-09 NOTE — Telephone Encounter (Signed)
Faxed recent office notes from Dr. Burney Gauze to Dr. Ronita [facility]s [facility].  Confirmation received.//AB/CMA

## 2019-03-10 ENCOUNTER — Telehealth: Payer: Self-pay | Admitting: *Deleted

## 2019-03-10 NOTE — Telephone Encounter (Signed)
Opened in error.//AB/CMA 

## 2019-03-16 DIAGNOSIS — L7634 Postprocedural seroma of skin and subcutaneous tissue following other procedure: Secondary | ICD-10-CM | POA: Diagnosis not present

## 2019-03-17 ENCOUNTER — Inpatient Hospital Stay: Payer: BC Managed Care – PPO

## 2019-03-17 ENCOUNTER — Other Ambulatory Visit: Payer: Self-pay

## 2019-03-17 VITALS — BP 119/72 | HR 64 | Temp 98.7°F | Resp 16 | Ht 63.5 in | Wt 156.0 lb

## 2019-03-17 DIAGNOSIS — C50412 Malignant neoplasm of upper-outer quadrant of left female breast: Secondary | ICD-10-CM

## 2019-03-17 DIAGNOSIS — Z5111 Encounter for antineoplastic chemotherapy: Secondary | ICD-10-CM | POA: Diagnosis not present

## 2019-03-17 DIAGNOSIS — F17210 Nicotine dependence, cigarettes, uncomplicated: Secondary | ICD-10-CM | POA: Diagnosis not present

## 2019-03-17 DIAGNOSIS — E876 Hypokalemia: Secondary | ICD-10-CM | POA: Diagnosis not present

## 2019-03-17 DIAGNOSIS — N184 Chronic kidney disease, stage 4 (severe): Secondary | ICD-10-CM | POA: Diagnosis not present

## 2019-03-17 DIAGNOSIS — Z9012 Acquired absence of left breast and nipple: Secondary | ICD-10-CM | POA: Diagnosis not present

## 2019-03-17 DIAGNOSIS — Z171 Estrogen receptor negative status [ER-]: Secondary | ICD-10-CM | POA: Diagnosis not present

## 2019-03-17 DIAGNOSIS — Z79899 Other long term (current) drug therapy: Secondary | ICD-10-CM | POA: Diagnosis not present

## 2019-03-17 DIAGNOSIS — G629 Polyneuropathy, unspecified: Secondary | ICD-10-CM | POA: Diagnosis not present

## 2019-03-17 DIAGNOSIS — I129 Hypertensive chronic kidney disease with stage 1 through stage 4 chronic kidney disease, or unspecified chronic kidney disease: Secondary | ICD-10-CM | POA: Diagnosis not present

## 2019-03-17 DIAGNOSIS — Z95828 Presence of other vascular implants and grafts: Secondary | ICD-10-CM

## 2019-03-17 DIAGNOSIS — I158 Other secondary hypertension: Secondary | ICD-10-CM

## 2019-03-17 LAB — COMPREHENSIVE METABOLIC PANEL
ALT: 19 U/L (ref 0–44)
AST: 17 U/L (ref 15–41)
Albumin: 3.7 g/dL (ref 3.5–5.0)
Alkaline Phosphatase: 109 U/L (ref 38–126)
Anion gap: 10 (ref 5–15)
BUN: 18 mg/dL (ref 8–23)
CO2: 23 mmol/L (ref 22–32)
Calcium: 9 mg/dL (ref 8.9–10.3)
Chloride: 107 mmol/L (ref 98–111)
Creatinine, Ser: 0.82 mg/dL (ref 0.44–1.00)
GFR calc Af Amer: >60 mL/min (ref >60)
GFR calc non Af Amer: >60 mL/min (ref >60)
Glucose, Bld: 114 mg/dL — ABNORMAL HIGH (ref 70–99)
Potassium: 4 mmol/L (ref 3.5–5.1)
Sodium: 140 mmol/L (ref 135–145)
Total Bilirubin: 0.5 mg/dL (ref 0.3–1.2)
Total Protein: 7.6 g/dL (ref 6.5–8.1)

## 2019-03-17 LAB — CBC WITH DIFFERENTIAL/PLATELET
Abs Immature Granulocytes: 0.02 10*3/uL (ref 0.00–0.07)
Basophils Absolute: 0.1 10*3/uL (ref 0.0–0.1)
Basophils Relative: 1 %
Eosinophils Absolute: 0.2 10*3/uL (ref 0.0–0.5)
Eosinophils Relative: 3 %
HCT: 36.6 % (ref 36.0–46.0)
Hemoglobin: 11.5 g/dL — ABNORMAL LOW (ref 12.0–15.0)
Immature Granulocytes: 0 %
Lymphocytes Relative: 25 %
Lymphs Abs: 1.3 10*3/uL (ref 0.7–4.0)
MCH: 27.7 pg (ref 26.0–34.0)
MCHC: 31.4 g/dL (ref 30.0–36.0)
MCV: 88.2 fL (ref 80.0–100.0)
Monocytes Absolute: 0.5 10*3/uL (ref 0.1–1.0)
Monocytes Relative: 9 %
Neutro Abs: 3.1 10*3/uL (ref 1.7–7.7)
Neutrophils Relative %: 62 %
Platelets: 291 10*3/uL (ref 150–400)
RBC: 4.15 MIL/uL (ref 3.87–5.11)
RDW: 15.2 % (ref 11.5–15.5)
WBC: 5.1 10*3/uL (ref 4.0–10.5)
nRBC: 0 % (ref 0.0–0.2)

## 2019-03-17 MED ORDER — ACETAMINOPHEN 325 MG PO TABS
ORAL_TABLET | ORAL | Status: AC
  Filled 2019-03-17: qty 2

## 2019-03-17 MED ORDER — SODIUM CHLORIDE 0.9 % IV SOLN
Freq: Once | INTRAVENOUS | Status: AC
  Filled 2019-03-17: qty 250

## 2019-03-17 MED ORDER — DIPHENHYDRAMINE HCL 25 MG PO CAPS
25.0000 mg | ORAL_CAPSULE | Freq: Once | ORAL | Status: AC
  Administered 2019-03-17: 25 mg via ORAL

## 2019-03-17 MED ORDER — TRASTUZUMAB-DKST CHEMO 150 MG IV SOLR
450.0000 mg | Freq: Once | INTRAVENOUS | Status: AC
  Administered 2019-03-17: 450 mg via INTRAVENOUS
  Filled 2019-03-17: qty 21.43

## 2019-03-17 MED ORDER — SODIUM CHLORIDE 0.9% FLUSH
10.0000 mL | INTRAVENOUS | Status: DC | PRN
  Administered 2019-03-17: 10 mL
  Filled 2019-03-17: qty 10

## 2019-03-17 MED ORDER — HEPARIN SOD (PORK) LOCK FLUSH 100 UNIT/ML IV SOLN
500.0000 [IU] | Freq: Once | INTRAVENOUS | Status: AC | PRN
  Administered 2019-03-17: 500 [IU]
  Filled 2019-03-17: qty 5

## 2019-03-17 MED ORDER — ACETAMINOPHEN 325 MG PO TABS
650.0000 mg | ORAL_TABLET | Freq: Once | ORAL | Status: AC
  Administered 2019-03-17: 650 mg via ORAL

## 2019-03-17 MED ORDER — DIPHENHYDRAMINE HCL 25 MG PO CAPS
ORAL_CAPSULE | ORAL | Status: AC
  Filled 2019-03-17: qty 1

## 2019-03-17 NOTE — Progress Notes (Signed)
Pt has benadryl listed as an allergy.   Pt states that she tolerated the 25mg  dose without any reaction.

## 2019-03-17 NOTE — Patient Instructions (Signed)

## 2019-03-17 NOTE — Patient Instructions (Signed)
[facility] [place] Discharge Instructions for Patients Receiving Chemotherapy  Today you received the following chemotherapy agents:  Trastuzuamb   To help prevent nausea and vomiting after your treatment, we encourage you to take your nausea medication as prescribed.   If you develop nausea and vomiting that is not controlled by your nausea medication, call the clinic.   BELOW ARE SYMPTOMS THAT SHOULD BE REPORTED IMMEDIATELY:  *FEVER GREATER THAN 100.5 F  *CHILLS WITH OR WITHOUT FEVER  NAUSEA AND VOMITING THAT IS NOT CONTROLLED WITH YOUR NAUSEA MEDICATION  *UNUSUAL SHORTNESS OF BREATH  *UNUSUAL BRUISING OR BLEEDING  TENDERNESS IN MOUTH AND THROAT WITH OR WITHOUT PRESENCE OF ULCERS  *URINARY PROBLEMS  *BOWEL PROBLEMS  UNUSUAL RASH Items with * indicate a potential emergency and should be followed up as soon as possible.  Feel free to call the clinic should you have any questions or concerns. The clinic phone number is [phone].  Please show the [place] at check-in to the Emergency Department and triage nurse.

## 2019-03-22 ENCOUNTER — Other Ambulatory Visit: Payer: Self-pay | Admitting: Internal Medicine

## 2019-03-22 NOTE — Progress Notes (Signed)
ICD-10-CM   1. History of breast cancer, left, status post partial mastectomy and XRT  Z85.3       Patient ID: Dorothy Frederick, female    DOB: Jun 02, 1955, 64 y.o.   MRN: [id number]   History of Present Illness: Dorothy Frederick is a 64 y.o.  female  with a history of a nonhealing left chest wall wound.  She presents for preoperative evaluation for upcoming procedure, left breast wound excision and possible closure, scheduled for 04/01/2019 with Dr. Marla Roe.  Summary from previous visit: Patient has a history of breast cancer undergoing a partial mastectomy and radiation in 2000.  In 2020 she had reoccurrence of the breast cancer.  Diagnosed with T1cN0, stage IA invasive ductal carcinoma, grade 3, ER/PR negative,HER-2 amplified, with an MIB-1 of 70%.  She underwent a left mastectomy in August 2020.  Had seroma formation to the left chest postmastectomy requiring drainage.  She is currently receiving trastuzumab and will continue chemotherapy through September 2021.  Patient was referred by Dr. Ninfa Linden. Area around the wound is hard due to previous radiation treatment.  Job: Pension scheme manager, computer work.  PMH Significant for: recurrent breast cancer, hx of radiation treatment, hypothyroidism, HTN, HLD, COPD, CAD, cardiac cath. Currently has a port on right side.   The patient has not had problems with anesthesia.   Past Medical History: Allergies: Allergies  Allergen Reactions  . Benadryl [Diphenhydramine Hcl (Sleep)] Palpitations    Increased heartrate  . Sulfa Antibiotics Rash    Current Medications:  Current Outpatient Medications:  .  albuterol (PROVENTIL HFA;VENTOLIN HFA) 108 (90 BASE) MCG/ACT inhaler, Inhale 2 puffs into the lungs every 6 (six) hours as needed for wheezing or shortness of breath., Disp: , Rfl:  .  alendronate (FOSAMAX) 70 MG tablet, Take 1 tablet by mouth once a week., Disp: , Rfl:  .  atorvastatin (LIPITOR) 10 MG tablet, Take 40 mg by mouth daily. , Disp: ,  Rfl:  .  atorvastatin (LIPITOR) 20 MG tablet, Take 20 mg by mouth at bedtime., Disp: , Rfl:  .  Cholecalciferol 1000 UNITS capsule, Take 500 Units by mouth daily. , Disp: , Rfl:  .  escitalopram (LEXAPRO) 10 MG tablet, Take 10 mg by mouth daily., Disp: , Rfl: 3 .  famotidine (PEPCID) 40 MG tablet, Take 40 mg by mouth daily., Disp: , Rfl:  .  Fluticasone-Umeclidin-Vilant (TRELEGY ELLIPTA) 100-62.5-25 MCG/INH AEPB, Inhale 1 puff into the lungs daily as needed., Disp: , Rfl:  .  levothyroxine (SYNTHROID, LEVOTHROID) 50 MCG tablet, Take 1 tablet by mouth daily., Disp: , Rfl:  .  lidocaine-prilocaine (EMLA) cream, Apply to affected area once, Disp: 30 g, Rfl: 3 .  LORazepam (ATIVAN) 0.5 MG tablet, Take 1 tablet (0.5 mg total) by mouth at bedtime as needed (Nausea or vomiting)., Disp: 30 tablet, Rfl: 0 .  ondansetron (ZOFRAN) 8 MG tablet, Take 1 tablet (8 mg total) by mouth 2 (two) times daily as needed (Nausea or vomiting)., Disp: 30 tablet, Rfl: 1 No current facility-administered medications for this visit.  Facility-Administered Medications Ordered in Other Visits:  .  0.9 %  sodium chloride infusion, , Intravenous, Continuous, Causey, Charlestine Massed, NP, Stopped at 12/24/18 1548  Past Medical Problems: Past Medical History:  Diagnosis Date  . Angina at rest [facility])   . Anxiety   . Cancer [facility]) 2000   left breast cancer-partial mastectomy  . COPD (chronic obstructive pulmonary disease) ([place])   . Depression   . Family history  of breast cancer   . Family history of esophageal cancer   . Family history of lung cancer   . Family history of prostate cancer   . GERD (gastroesophageal reflux disease)   . Hyperlipidemia   . Hypertension   . Hypothyroidism   . Recurrent breast cancer, left [facility])     Past Surgical History: Past Surgical History:  Procedure Laterality Date  . ADENOIDECTOMY    . LEFT HEART CATHETERIZATION WITH CORONARY ANGIOGRAM N/A 10/21/2013   Procedure: LEFT HEART  CATHETERIZATION WITH CORONARY ANGIOGRAM;  Surgeon: Burnell Blanks, MD;  Location: [facility] CATH LAB;  Service: Cardiovascular;  Laterality: N/A;  . MASTECTOMY W/ SENTINEL NODE BIOPSY Left 09/16/2018   Procedure: LEFT TOTAL MASTECTOMY WITH LEFT AXILLARY DEEP SENTINEL LYMPH NODE BIOPSY AND INJECT BLUE DYE;  Surgeon: Fanny Skates, MD;  Location: [place];  Service: General;  Laterality: Left;  [facility] Kitchen MASTECTOMY, PARTIAL Left 2000  . PORTACATH PLACEMENT Right 09/16/2018   Procedure: INSERTION PORT-A-CATH;  Surgeon: Fanny Skates, MD;  Location: [place];  Service: General;  Laterality: Right;  . TONSILLECTOMY      Social History: Social History   Socioeconomic History  . Marital status: Divorced    Spouse name: Not on file  . Number of children: Not on file  . Years of education: Not on file  . Highest education level: Not on file  Occupational History  . Not on file  Tobacco Use  . Smoking status: Former Smoker    Packs/day: 1.00    Years: 38.00    Pack years: 38.00    Types: Cigarettes  . Smokeless tobacco: Never Used  Substance and Sexual Activity  . Alcohol use: No  . Drug use: No  . Sexual activity: Not Currently    Birth control/protection: Post-menopausal  Other Topics Concern  . Not on file  Social History Narrative  . Not on file   Social Determinants of Health   Financial Resource Strain:   . Difficulty of Paying Living Expenses: Not on file  Food Insecurity:   . Worried About Charity fundraiser in the Last Year: Not on file  . Ran Out of Food in the Last Year: Not on file  Transportation Needs:   . Lack of Transportation (Medical): Not on file  . Lack of Transportation (Non-Medical): Not on file  Physical Activity:   . Days of Exercise per Week: Not on file  . Minutes of Exercise per Session: Not on file  Stress:   . Feeling of Stress : Not on file  Social Connections:   . Frequency of Communication with Friends and  Family: Not on file  . Frequency of Social Gatherings with Friends and Family: Not on file  . Attends Religious Services: Not on file  . Active Member of Clubs or Organizations: Not on file  . Attends Archivist Meetings: Not on file  . Marital Status: Not on file  Intimate Partner Violence:   . Fear of Current or Ex-Partner: Not on file  . Emotionally Abused: Not on file  . Physically Abused: Not on file  . Sexually Abused: Not on file    Family History: Family History  Problem Relation Age of Onset  . Heart disease Mother   . Hypertension Father   . Cancer Father   . Prostate cancer Father   . Hypertension Sister   . Breast cancer Sister        dx 39s  .  Heart disease Sister   . Hypertension Brother   . Hyperlipidemia Maternal Grandmother   . Stroke Maternal Grandmother   . Hypertension Brother   . Breast cancer Maternal Aunt   . Lung cancer Maternal Aunt   . Esophageal cancer Maternal Aunt   . Stomach cancer Paternal Grandmother     Review of Systems: Review of Systems  Constitutional: Negative for chills and fever.  HENT: Negative for congestion and sore throat.   Respiratory: Negative for cough and shortness of breath.   Cardiovascular: Negative for chest pain and palpitations.  Gastrointestinal: Negative for abdominal pain, nausea and vomiting.  Musculoskeletal: Negative for back pain, joint pain, myalgias and neck pain.  Skin: Negative for itching and rash.       Open wound lateral aspect of left breast.    Physical Exam: Vital Signs BP 128/78 (BP Location: Right Arm, Patient Position: Sitting, Cuff Size: Normal)   Pulse 70   Temp (!) 97.1 F (36.2 C) (Temporal)   Ht '5\' 3"'  (1.6 m)   Wt 157 lb (71.2 kg)   SpO2 94%   BMI 27.81 kg/m  Physical Exam Constitutional:      Appearance: Normal appearance. She is normal weight.  HENT:     Head: Normocephalic and atraumatic.  Eyes:     Extraocular Movements: Extraocular movements intact.    Cardiovascular:     Rate and Rhythm: Normal rate and regular rhythm.     Pulses: Normal pulses.     Heart sounds: Normal heart sounds.  Pulmonary:     Effort: Pulmonary effort is normal.     Breath sounds: Normal breath sounds. No wheezing, rhonchi or rales.  Chest:    Abdominal:     General: Bowel sounds are normal.     Palpations: Abdomen is soft.  Musculoskeletal:        General: No swelling. Normal range of motion.     Cervical back: Normal range of motion.  Skin:    General: Skin is warm and dry.     Coloration: Skin is not pale.     Findings: No erythema or rash.  Neurological:     General: No focal deficit present.     Mental Status: She is alert and oriented to person, place, and time.  Psychiatric:        Mood and Affect: Mood normal.        Behavior: Behavior normal.        Thought Content: Thought content normal.        Judgment: Judgment normal.     Assessment/Plan:  Ms. Vittitow scheduled for left breast wound excision and possible closure with placement of Primatrix or Strattice and VAC placement With Dr. Marla Roe.  Risks, benefits, and alternatives of procedure discussed, questions answered and consent obtained.    Smoking Status: Non-smoker - quit 5 years ago. Last Mammogram: 07/06/18; Results: 1.5 cm mass in left breast, no other significant findings in either breast. (Left mastectomy aug 2020)  Caprini Score: 10 High; Risk Factors include: 64 yr-old female, BMI > 25, hx of cancer, port, COPD & emphysema, and length of planned surgery. Recommendation for mechanicaland pharmacological prophylaxis during surgery. Encourage early ambulation.   Requested cardiac clearance from Dr. Skeet Latch  Pictures obtained: 03/09/19  Post-op Rx sent to pharmacy: Zofran, norco, keflex  The 21st Century Cures Act was signed into law in 2016 which includes the topic of electronic health records.  This provides immediate access to information in MyChart.  This  includes consultation notes, operative notes, office notes, lab results and pathology reports.  If you have any questions about what you read please let us know at your next visit or call us at the office.  We are right here with you.    Electronically signed by: Threasa Heads, PA-C 03/23/2019 10:42 AM

## 2019-03-22 NOTE — H&P (View-Only) (Signed)
ICD-10-CM   1. History of breast cancer, left, status post partial mastectomy and XRT  Z85.3       Patient ID: Dorothy Frederick, female    DOB: 03-02-1955, 63 y.o.   MRN: [id number]   History of Present Illness: Dorothy Frederick is a 64 y.o.  female  with a history of a nonhealing left chest wall wound.  She presents for preoperative evaluation for upcoming procedure, left breast wound excision and possible closure, scheduled for 04/01/2019 with Dr. Marla Roe.  Summary from previous visit: Patient has a history of breast cancer undergoing a partial mastectomy and radiation in 2000.  In 2020 she had reoccurrence of the breast cancer.  Diagnosed with T1cN0, stage IA invasive ductal carcinoma, grade 3, ER/PR negative,HER-2 amplified, with an MIB-1 of 70%.  She underwent a left mastectomy in August 2020.  Had seroma formation to the left chest postmastectomy requiring drainage.  She is currently receiving trastuzumab and will continue chemotherapy through September 2021.  Patient was referred by Dr. Ninfa Linden. Area around the wound is hard due to previous radiation treatment.  Job: Pension scheme manager, computer work.  PMH Significant for: recurrent breast cancer, hx of radiation treatment, hypothyroidism, HTN, HLD, COPD, CAD, cardiac cath. Currently has a port on right side.   The patient has not had problems with anesthesia.   Past Medical History: Allergies: Allergies  Allergen Reactions  . Benadryl [Diphenhydramine Hcl (Sleep)] Palpitations    Increased heartrate  . Sulfa Antibiotics Rash    Current Medications:  Current Outpatient Medications:  .  albuterol (PROVENTIL HFA;VENTOLIN HFA) 108 (90 BASE) MCG/ACT inhaler, Inhale 2 puffs into the lungs every 6 (six) hours as needed for wheezing or shortness of breath., Disp: , Rfl:  .  alendronate (FOSAMAX) 70 MG tablet, Take 1 tablet by mouth once a week., Disp: , Rfl:  .  atorvastatin (LIPITOR) 10 MG tablet, Take 40 mg by mouth daily. , Disp: ,  Rfl:  .  atorvastatin (LIPITOR) 20 MG tablet, Take 20 mg by mouth at bedtime., Disp: , Rfl:  .  Cholecalciferol 1000 UNITS capsule, Take 500 Units by mouth daily. , Disp: , Rfl:  .  escitalopram (LEXAPRO) 10 MG tablet, Take 10 mg by mouth daily., Disp: , Rfl: 3 .  famotidine (PEPCID) 40 MG tablet, Take 40 mg by mouth daily., Disp: , Rfl:  .  Fluticasone-Umeclidin-Vilant (TRELEGY ELLIPTA) 100-62.5-25 MCG/INH AEPB, Inhale 1 puff into the lungs daily as needed., Disp: , Rfl:  .  levothyroxine (SYNTHROID, LEVOTHROID) 50 MCG tablet, Take 1 tablet by mouth daily., Disp: , Rfl:  .  lidocaine-prilocaine (EMLA) cream, Apply to affected area once, Disp: 30 g, Rfl: 3 .  LORazepam (ATIVAN) 0.5 MG tablet, Take 1 tablet (0.5 mg total) by mouth at bedtime as needed (Nausea or vomiting)., Disp: 30 tablet, Rfl: 0 .  ondansetron (ZOFRAN) 8 MG tablet, Take 1 tablet (8 mg total) by mouth 2 (two) times daily as needed (Nausea or vomiting)., Disp: 30 tablet, Rfl: 1 No current facility-administered medications for this visit.  Facility-Administered Medications Ordered in Other Visits:  .  0.9 %  sodium chloride infusion, , Intravenous, Continuous, Causey, Charlestine Massed, NP, Stopped at 12/24/18 1548  Past Medical Problems: Past Medical History:  Diagnosis Date  . Angina at rest [facility])   . Anxiety   . Cancer [facility]) 2000   left breast cancer-partial mastectomy  . COPD (chronic obstructive pulmonary disease) ([place])   . Depression   . Family history  of breast cancer   . Family history of esophageal cancer   . Family history of lung cancer   . Family history of prostate cancer   . GERD (gastroesophageal reflux disease)   . Hyperlipidemia   . Hypertension   . Hypothyroidism   . Recurrent breast cancer, left [facility])     Past Surgical History: Past Surgical History:  Procedure Laterality Date  . ADENOIDECTOMY    . LEFT HEART CATHETERIZATION WITH CORONARY ANGIOGRAM N/A 10/21/2013   Procedure: LEFT HEART  CATHETERIZATION WITH CORONARY ANGIOGRAM;  Surgeon: Burnell Blanks, MD;  Location: [facility] CATH LAB;  Service: Cardiovascular;  Laterality: N/A;  . MASTECTOMY W/ SENTINEL NODE BIOPSY Left 09/16/2018   Procedure: LEFT TOTAL MASTECTOMY WITH LEFT AXILLARY DEEP SENTINEL LYMPH NODE BIOPSY AND INJECT BLUE DYE;  Surgeon: Fanny Skates, MD;  Location: [place];  Service: General;  Laterality: Left;  [facility] MASTECTOMY, PARTIAL Left 2000  . PORTACATH PLACEMENT Right 09/16/2018   Procedure: INSERTION PORT-A-CATH;  Surgeon: Fanny Skates, MD;  Location: [place];  Service: General;  Laterality: Right;  . TONSILLECTOMY      Social History: Social History   Socioeconomic History  . Marital status: Divorced    Spouse name: Not on file  . Number of children: Not on file  . Years of education: Not on file  . Highest education level: Not on file  Occupational History  . Not on file  Tobacco Use  . Smoking status: Former Smoker    Packs/day: 1.00    Years: 38.00    Pack years: 38.00    Types: Cigarettes  . Smokeless tobacco: Never Used  Substance and Sexual Activity  . Alcohol use: No  . Drug use: No  . Sexual activity: Not Currently    Birth control/protection: Post-menopausal  Other Topics Concern  . Not on file  Social History Narrative  . Not on file   Social Determinants of Health   Financial Resource Strain:   . Difficulty of Paying Living Expenses: Not on file  Food Insecurity:   . Worried About Charity fundraiser in the Last Year: Not on file  . Ran Out of Food in the Last Year: Not on file  Transportation Needs:   . Lack of Transportation (Medical): Not on file  . Lack of Transportation (Non-Medical): Not on file  Physical Activity:   . Days of Exercise per Week: Not on file  . Minutes of Exercise per Session: Not on file  Stress:   . Feeling of Stress : Not on file  Social Connections:   . Frequency of Communication with Friends and  Family: Not on file  . Frequency of Social Gatherings with Friends and Family: Not on file  . Attends Religious Services: Not on file  . Active Member of Clubs or Organizations: Not on file  . Attends Archivist Meetings: Not on file  . Marital Status: Not on file  Intimate Partner Violence:   . Fear of Current or Ex-Partner: Not on file  . Emotionally Abused: Not on file  . Physically Abused: Not on file  . Sexually Abused: Not on file    Family History: Family History  Problem Relation Age of Onset  . Heart disease Mother   . Hypertension Father   . Cancer Father   . Prostate cancer Father   . Hypertension Sister   . Breast cancer Sister        dx 67s  .  Heart disease Sister   . Hypertension Brother   . Hyperlipidemia Maternal Grandmother   . Stroke Maternal Grandmother   . Hypertension Brother   . Breast cancer Maternal Aunt   . Lung cancer Maternal Aunt   . Esophageal cancer Maternal Aunt   . Stomach cancer Paternal Grandmother     Review of Systems: Review of Systems  Constitutional: Negative for chills and fever.  HENT: Negative for congestion and sore throat.   Respiratory: Negative for cough and shortness of breath.   Cardiovascular: Negative for chest pain and palpitations.  Gastrointestinal: Negative for abdominal pain, nausea and vomiting.  Musculoskeletal: Negative for back pain, joint pain, myalgias and neck pain.  Skin: Negative for itching and rash.       Open wound lateral aspect of left breast.    Physical Exam: Vital Signs BP 128/78 (BP Location: Right Arm, Patient Position: Sitting, Cuff Size: Normal)   Pulse 70   Temp (!) 97.1 F (36.2 C) (Temporal)   Ht '5\' 3"'  (1.6 m)   Wt 157 lb (71.2 kg)   SpO2 94%   BMI 27.81 kg/m  Physical Exam Constitutional:      Appearance: Normal appearance. She is normal weight.  HENT:     Head: Normocephalic and atraumatic.  Eyes:     Extraocular Movements: Extraocular movements intact.    Cardiovascular:     Rate and Rhythm: Normal rate and regular rhythm.     Pulses: Normal pulses.     Heart sounds: Normal heart sounds.  Pulmonary:     Effort: Pulmonary effort is normal.     Breath sounds: Normal breath sounds. No wheezing, rhonchi or rales.  Chest:    Abdominal:     General: Bowel sounds are normal.     Palpations: Abdomen is soft.  Musculoskeletal:        General: No swelling. Normal range of motion.     Cervical back: Normal range of motion.  Skin:    General: Skin is warm and dry.     Coloration: Skin is not pale.     Findings: No erythema or rash.  Neurological:     General: No focal deficit present.     Mental Status: She is alert and oriented to person, place, and time.  Psychiatric:        Mood and Affect: Mood normal.        Behavior: Behavior normal.        Thought Content: Thought content normal.        Judgment: Judgment normal.     Assessment/Plan:  Ms. Adel scheduled for left breast wound excision and possible closure with placement of Primatrix or Strattice and VAC placement With Dr. Marla Roe.  Risks, benefits, and alternatives of procedure discussed, questions answered and consent obtained.    Smoking Status: Non-smoker - quit 5 years ago. Last Mammogram: 07/06/18; Results: 1.5 cm mass in left breast, no other significant findings in either breast. (Left mastectomy aug 2020)  Caprini Score: 10 High; Risk Factors include: 64 yr-old female, BMI > 25, hx of cancer, port, COPD & emphysema, and length of planned surgery. Recommendation for mechanicaland pharmacological prophylaxis during surgery. Encourage early ambulation.   Requested cardiac clearance from Dr. Skeet Latch  Pictures obtained: 03/09/19  Post-op Rx sent to pharmacy: Zofran, norco, keflex  The 21st Century Cures Act was signed into law in 2016 which includes the topic of electronic health records.  This provides immediate access to information in MyChart.  This  includes consultation notes, operative notes, office notes, lab results and pathology reports.  If you have any questions about what you read please let us know at your next visit or call us at the office.  We are right here with you.    Electronically signed by: Threasa Heads, PA-C 03/23/2019 10:42 AM

## 2019-03-23 ENCOUNTER — Encounter: Payer: BC Managed Care – PPO | Admitting: Plastic Surgery

## 2019-03-23 ENCOUNTER — Encounter: Payer: Self-pay | Admitting: Plastic Surgery

## 2019-03-23 ENCOUNTER — Telehealth: Payer: Self-pay

## 2019-03-23 ENCOUNTER — Other Ambulatory Visit: Payer: Self-pay

## 2019-03-23 ENCOUNTER — Telehealth: Payer: Self-pay | Admitting: *Deleted

## 2019-03-23 ENCOUNTER — Ambulatory Visit (INDEPENDENT_AMBULATORY_CARE_PROVIDER_SITE_OTHER): Payer: BC Managed Care – PPO | Admitting: Plastic Surgery

## 2019-03-23 VITALS — BP 128/78 | HR 70 | Temp 97.1°F | Ht 63.0 in | Wt 157.0 lb

## 2019-03-23 DIAGNOSIS — Z853 Personal history of malignant neoplasm of breast: Secondary | ICD-10-CM

## 2019-03-23 MED ORDER — ONDANSETRON HCL 4 MG PO TABS: 4.0000 mg | ORAL_TABLET | Freq: Three times a day (TID) | ORAL | 0 refills | Status: DC | PRN

## 2019-03-23 MED ORDER — HYDROCODONE-ACETAMINOPHEN 5-325 MG PO TABS: 1.0000 | ORAL_TABLET | Freq: Three times a day (TID) | ORAL | 0 refills | Status: AC | PRN

## 2019-03-23 MED ORDER — CEPHALEXIN 500 MG PO CAPS: 500.0000 mg | ORAL_CAPSULE | Freq: Four times a day (QID) | ORAL | 0 refills | Status: AC

## 2019-03-23 NOTE — Telephone Encounter (Signed)
   Primary Cardiologist: Skeet Latch, MD  Chart reviewed as part of pre-operative protocol coverage. Patient was contacted 03/23/2019 in reference to pre-operative risk assessment for pending surgery as outlined below.  Dorothy Frederick was last seen on 08/10/2018 by Kerin Ransom PA-C.  Since that day, Dorothy Frederick has done well without chest pain or worsening dyspnea. Recent echo in Dec 2020 showed normal EF without significant valve issue.  Therefore, based on ACC/AHA guidelines, the patient would be at acceptable risk for the planned procedure without further cardiovascular testing.   I will route this recommendation to the requesting party via Epic fax function and remove from pre-op pool.  Please call with questions.  [place], [place] 03/23/2019, 4:54 PM

## 2019-03-23 NOTE — Telephone Encounter (Signed)
Surgical clearance faxed to Dr. Jonelle Sidle Bosque/Cardiology- [facility] to fax # [phone] per Kathrin Greathouse, [place] request Scanned this request to [facility] ( surgery scheduler)

## 2019-03-23 NOTE — Telephone Encounter (Signed)
   [facility] Pre-operative Risk Assessment    Request for surgical clearance:  1. What type of surgery is being performed?  Wound excision w/possible closure and placement of wound vac  2. When is this surgery scheduled? March 11,2021  3. What type of clearance is required (medical clearance vs. Pharmacy clearance to hold med vs. Both)?MEDICAL   4. Are there any medications that need to be held prior to surgery and how long?N/A  5. Practice name and name of physician performing surgery? [facility]; DR Marla Roe  6. What is your office phone number [phone]   7.   What is your office fax number [phone]  8.   Anesthesia type (None, local, MAC, general) ? GENERAL    Dorothy Frederick 03/23/2019, 4:41 PM  _________________________________________________________________   (provider comments below)

## 2019-03-23 NOTE — Telephone Encounter (Signed)
-----   Message from Threasa Heads, [place] sent at 03/23/2019 11:48 AM EST ----- Regarding: RE: [facility] Spoke with Dr. Marla Roe after seeing patient today, we can hold off on the wound vac at this time. ----- Message ----- From: Susann Givens Sent: 03/16/2019   1:24 PM EST To: Loel Lofty Dillingham, DO, Harl Bowie, CMA, # Subject: [facility]                                            Surgery is scheduled for  04/01/2019 at 10:45 am at [facility]. VAC placement needed, but I am not sure if she has a wound VAC or not.

## 2019-03-25 ENCOUNTER — Telehealth: Payer: Self-pay

## 2019-03-25 ENCOUNTER — Encounter (HOSPITAL_BASED_OUTPATIENT_CLINIC_OR_DEPARTMENT_OTHER): Payer: Self-pay | Admitting: Plastic Surgery

## 2019-03-25 ENCOUNTER — Other Ambulatory Visit: Payer: Self-pay

## 2019-03-25 NOTE — Telephone Encounter (Signed)
Surgical Clearance requested by Venetia Night, PA was  received from [facility]/ Cardiac- pt is cleared for scheduled surgery on 04/01/19 with Dr. Marla Roe  Clearance will be scanned in to pt's chart & forwarded to Dr. Garner Gavel & Frances Furbish, surgery scheduler as well

## 2019-03-27 ENCOUNTER — Other Ambulatory Visit (HOSPITAL_COMMUNITY): Payer: BC Managed Care – PPO

## 2019-03-29 ENCOUNTER — Other Ambulatory Visit (HOSPITAL_COMMUNITY)
Admission: RE | Admit: 2019-03-29 | Discharge: 2019-03-29 | Disposition: A | Discharge status: Home / Self Care | Payer: BC Managed Care – PPO | Source: Ambulatory Visit | Attending: Plastic Surgery | Admitting: Plastic Surgery

## 2019-03-29 DIAGNOSIS — Z20822 Contact with and (suspected) exposure to covid-19: Secondary | ICD-10-CM | POA: Insufficient documentation

## 2019-03-29 DIAGNOSIS — Z01812 Encounter for preprocedural laboratory examination: Secondary | ICD-10-CM | POA: Diagnosis not present

## 2019-03-29 LAB — SARS CORONAVIRUS 2 (TAT 6-24 HRS): SARS Coronavirus 2: NEGATIVE

## 2019-04-01 ENCOUNTER — Ambulatory Visit (HOSPITAL_BASED_OUTPATIENT_CLINIC_OR_DEPARTMENT_OTHER): Payer: BC Managed Care – PPO | Admitting: Anesthesiology

## 2019-04-01 ENCOUNTER — Encounter (HOSPITAL_BASED_OUTPATIENT_CLINIC_OR_DEPARTMENT_OTHER): Payer: Self-pay | Admitting: Plastic Surgery

## 2019-04-01 ENCOUNTER — Encounter (HOSPITAL_BASED_OUTPATIENT_CLINIC_OR_DEPARTMENT_OTHER)
Admission: RE | Disposition: A | Discharge status: Home / Self Care | Payer: Self-pay | Source: Home / Self Care | Attending: Plastic Surgery

## 2019-04-01 ENCOUNTER — Other Ambulatory Visit: Payer: Self-pay

## 2019-04-01 ENCOUNTER — Ambulatory Visit (HOSPITAL_BASED_OUTPATIENT_CLINIC_OR_DEPARTMENT_OTHER)
Admission: RE | Admit: 2019-04-01 | Discharge: 2019-04-01 | Disposition: A | Discharge status: Home / Self Care | Payer: BC Managed Care – PPO | Attending: Plastic Surgery | Admitting: Plastic Surgery

## 2019-04-01 DIAGNOSIS — Z7983 Long term (current) use of bisphosphonates: Secondary | ICD-10-CM | POA: Insufficient documentation

## 2019-04-01 DIAGNOSIS — I10 Essential (primary) hypertension: Secondary | ICD-10-CM | POA: Diagnosis not present

## 2019-04-01 DIAGNOSIS — Z87891 Personal history of nicotine dependence: Secondary | ICD-10-CM | POA: Insufficient documentation

## 2019-04-01 DIAGNOSIS — E039 Hypothyroidism, unspecified: Secondary | ICD-10-CM | POA: Diagnosis not present

## 2019-04-01 DIAGNOSIS — Z9012 Acquired absence of left breast and nipple: Secondary | ICD-10-CM | POA: Diagnosis not present

## 2019-04-01 DIAGNOSIS — S21102A Unspecified open wound of left front wall of thorax without penetration into thoracic cavity, initial encounter: Secondary | ICD-10-CM | POA: Diagnosis not present

## 2019-04-01 DIAGNOSIS — I2510 Atherosclerotic heart disease of native coronary artery without angina pectoris: Secondary | ICD-10-CM | POA: Diagnosis not present

## 2019-04-01 DIAGNOSIS — Z923 Personal history of irradiation: Secondary | ICD-10-CM | POA: Insufficient documentation

## 2019-04-01 DIAGNOSIS — J449 Chronic obstructive pulmonary disease, unspecified: Secondary | ICD-10-CM | POA: Diagnosis not present

## 2019-04-01 DIAGNOSIS — L599 Disorder of the skin and subcutaneous tissue related to radiation, unspecified: Secondary | ICD-10-CM | POA: Diagnosis not present

## 2019-04-01 DIAGNOSIS — S21002A Unspecified open wound of left breast, initial encounter: Secondary | ICD-10-CM | POA: Diagnosis not present

## 2019-04-01 DIAGNOSIS — Z79899 Other long term (current) drug therapy: Secondary | ICD-10-CM | POA: Diagnosis not present

## 2019-04-01 DIAGNOSIS — E785 Hyperlipidemia, unspecified: Secondary | ICD-10-CM | POA: Insufficient documentation

## 2019-04-01 DIAGNOSIS — C801 Malignant (primary) neoplasm, unspecified: Secondary | ICD-10-CM | POA: Insufficient documentation

## 2019-04-01 DIAGNOSIS — Z171 Estrogen receptor negative status [ER-]: Secondary | ICD-10-CM | POA: Diagnosis not present

## 2019-04-01 DIAGNOSIS — E7800 Pure hypercholesterolemia, unspecified: Secondary | ICD-10-CM | POA: Diagnosis not present

## 2019-04-01 DIAGNOSIS — Z803 Family history of malignant neoplasm of breast: Secondary | ICD-10-CM | POA: Diagnosis not present

## 2019-04-01 DIAGNOSIS — Y838 Other surgical procedures as the cause of abnormal reaction of the patient, or of later complication, without mention of misadventure at the time of the procedure: Secondary | ICD-10-CM | POA: Insufficient documentation

## 2019-04-01 DIAGNOSIS — Z79890 Hormone replacement therapy (postmenopausal): Secondary | ICD-10-CM | POA: Diagnosis not present

## 2019-04-01 DIAGNOSIS — L905 Scar conditions and fibrosis of skin: Secondary | ICD-10-CM | POA: Diagnosis not present

## 2019-04-01 HISTORY — PX: DEBRIDEMENT AND CLOSURE WOUND: SHX5614

## 2019-04-01 HISTORY — PX: APPLICATION OF WOUND VAC: SHX5189

## 2019-04-01 SURGERY — DEBRIDEMENT, WOUND, WITH CLOSURE
Anesthesia: General | Site: Breast | Laterality: Left

## 2019-04-01 MED ORDER — FENTANYL CITRATE (PF) 100 MCG/2ML IJ SOLN
INTRAMUSCULAR | Status: DC | PRN
  Administered 2019-04-01: 50 ug via INTRAVENOUS

## 2019-04-01 MED ORDER — DEXAMETHASONE SODIUM PHOSPHATE 4 MG/ML IJ SOLN
INTRAMUSCULAR | Status: DC | PRN
  Administered 2019-04-01: 4 mg via INTRAVENOUS

## 2019-04-01 MED ORDER — ACETAMINOPHEN 500 MG PO TABS
ORAL_TABLET | ORAL | Status: AC
  Filled 2019-04-01: qty 2

## 2019-04-01 MED ORDER — BUPIVACAINE HCL (PF) 0.25 % IJ SOLN
INTRAMUSCULAR | Status: AC
  Filled 2019-04-01: qty 60

## 2019-04-01 MED ORDER — LIDOCAINE-EPINEPHRINE 1 %-1:100000 IJ SOLN
INTRAMUSCULAR | Status: AC
  Filled 2019-04-01: qty 2

## 2019-04-01 MED ORDER — BACITRACIN ZINC 500 UNIT/GM EX OINT
TOPICAL_OINTMENT | CUTANEOUS | Status: AC
  Filled 2019-04-01: qty 28.35

## 2019-04-01 MED ORDER — OXYCODONE HCL 5 MG PO TABS: 5.0000 mg | ORAL_TABLET | Freq: Once | ORAL | Status: DC | PRN

## 2019-04-01 MED ORDER — MIDAZOLAM HCL 2 MG/2ML IJ SOLN
INTRAMUSCULAR | Status: AC
  Filled 2019-04-01: qty 2

## 2019-04-01 MED ORDER — OXYCODONE HCL 5 MG/5ML PO SOLN: 5.0000 mg | Freq: Once | ORAL | Status: DC | PRN

## 2019-04-01 MED ORDER — CEFAZOLIN SODIUM-DEXTROSE 2-4 GM/100ML-% IV SOLN
2.0000 g | INTRAVENOUS | Status: AC
  Administered 2019-04-01: 2 g via INTRAVENOUS

## 2019-04-01 MED ORDER — EPHEDRINE 5 MG/ML INJ
INTRAVENOUS | Status: AC
  Filled 2019-04-01: qty 10

## 2019-04-01 MED ORDER — LIDOCAINE HCL (PF) 1 % IJ SOLN
INTRAMUSCULAR | Status: AC
  Filled 2019-04-01: qty 60

## 2019-04-01 MED ORDER — ACETAMINOPHEN 500 MG PO TABS
1000.0000 mg | ORAL_TABLET | Freq: Once | ORAL | Status: AC
  Administered 2019-04-01: 1000 mg via ORAL

## 2019-04-01 MED ORDER — LIDOCAINE HCL (CARDIAC) PF 100 MG/5ML IV SOSY
PREFILLED_SYRINGE | INTRAVENOUS | Status: DC | PRN
  Administered 2019-04-01: 50 mg via INTRAVENOUS

## 2019-04-01 MED ORDER — ONDANSETRON HCL 4 MG/2ML IJ SOLN
INTRAMUSCULAR | Status: AC
  Filled 2019-04-01: qty 2

## 2019-04-01 MED ORDER — SUCCINYLCHOLINE CHLORIDE 200 MG/10ML IV SOSY
PREFILLED_SYRINGE | INTRAVENOUS | Status: AC
  Filled 2019-04-01: qty 10

## 2019-04-01 MED ORDER — SODIUM CHLORIDE 0.9 % IV SOLN
INTRAVENOUS | Status: DC | PRN
  Administered 2019-04-01: 500 mL

## 2019-04-01 MED ORDER — PHENYLEPHRINE 40 MCG/ML (10ML) SYRINGE FOR IV PUSH (FOR BLOOD PRESSURE SUPPORT)
PREFILLED_SYRINGE | INTRAVENOUS | Status: AC
  Filled 2019-04-01: qty 10

## 2019-04-01 MED ORDER — EPHEDRINE SULFATE 50 MG/ML IJ SOLN
INTRAMUSCULAR | Status: DC | PRN
  Administered 2019-04-01 (×2): 10 mg via INTRAVENOUS

## 2019-04-01 MED ORDER — ONDANSETRON HCL 4 MG/2ML IJ SOLN
INTRAMUSCULAR | Status: DC | PRN
  Administered 2019-04-01: 4 mg via INTRAVENOUS

## 2019-04-01 MED ORDER — BUPIVACAINE HCL (PF) 0.5 % IJ SOLN
INTRAMUSCULAR | Status: AC
  Filled 2019-04-01: qty 60

## 2019-04-01 MED ORDER — LACTATED RINGERS IV SOLN: INTRAVENOUS | Status: DC

## 2019-04-01 MED ORDER — LIDOCAINE-EPINEPHRINE 1 %-1:100000 IJ SOLN
INTRAMUSCULAR | Status: DC | PRN
  Administered 2019-04-01: 8 mL

## 2019-04-01 MED ORDER — DIPHENHYDRAMINE HCL 50 MG/ML IJ SOLN
INTRAMUSCULAR | Status: DC | PRN
  Administered 2019-04-01: 6.25 mg via INTRAVENOUS

## 2019-04-01 MED ORDER — CEFAZOLIN SODIUM-DEXTROSE 2-4 GM/100ML-% IV SOLN
INTRAVENOUS | Status: AC
  Filled 2019-04-01: qty 100

## 2019-04-01 MED ORDER — LIDOCAINE 2% (20 MG/ML) 5 ML SYRINGE
INTRAMUSCULAR | Status: AC
  Filled 2019-04-01: qty 5

## 2019-04-01 MED ORDER — FENTANYL CITRATE (PF) 100 MCG/2ML IJ SOLN
INTRAMUSCULAR | Status: AC
  Filled 2019-04-01: qty 2

## 2019-04-01 MED ORDER — DEXAMETHASONE SODIUM PHOSPHATE 10 MG/ML IJ SOLN
INTRAMUSCULAR | Status: AC
  Filled 2019-04-01: qty 1

## 2019-04-01 MED ORDER — SODIUM CHLORIDE 0.9 % IV SOLN
INTRAVENOUS | Status: AC
  Filled 2019-04-01: qty 500000

## 2019-04-01 MED ORDER — HYDROMORPHONE HCL 1 MG/ML IJ SOLN: 0.2500 mg | INTRAMUSCULAR | Status: DC | PRN

## 2019-04-01 MED ORDER — PROPOFOL 10 MG/ML IV BOLUS
INTRAVENOUS | Status: DC | PRN
  Administered 2019-04-01: 200 mg via INTRAVENOUS

## 2019-04-01 MED ORDER — MIDAZOLAM HCL 5 MG/5ML IJ SOLN
INTRAMUSCULAR | Status: DC | PRN
  Administered 2019-04-01: 1 mg via INTRAVENOUS

## 2019-04-01 MED ORDER — CHLORHEXIDINE GLUCONATE CLOTH 2 % EX PADS: 6.0000 | MEDICATED_PAD | Freq: Once | CUTANEOUS | Status: DC

## 2019-04-01 MED ORDER — PROMETHAZINE HCL 25 MG/ML IJ SOLN: 6.2500 mg | INTRAMUSCULAR | Status: DC | PRN

## 2019-04-01 MED ORDER — EPINEPHRINE PF 1 MG/ML IJ SOLN
INTRAMUSCULAR | Status: AC
  Filled 2019-04-01: qty 1

## 2019-04-01 SURGICAL SUPPLY — 40 items
BAG DECANTER FOR FLEXI CONT (MISCELLANEOUS) ×2 IMPLANT
BINDER BREAST LRG (GAUZE/BANDAGES/DRESSINGS) IMPLANT
BINDER BREAST XLRG (GAUZE/BANDAGES/DRESSINGS) IMPLANT
BLADE SURG 10 STRL SS (BLADE) ×2 IMPLANT
BLADE SURG 15 STRL LF DISP TIS (BLADE) ×1 IMPLANT
BLADE SURG 15 STRL SS (BLADE) ×1
CANISTER SUCT 1200ML W/VALVE (MISCELLANEOUS) ×2 IMPLANT
COVER BACK TABLE 60X90IN (DRAPES) ×2 IMPLANT
COVER MAYO STAND STRL (DRAPES) ×2 IMPLANT
DRAIN PENROSE 1/2X12 LTX STRL (WOUND CARE) IMPLANT
DRAPE INCISE IOBAN 66X45 STRL (DRAPES) ×2 IMPLANT
DRAPE LAPAROTOMY 100X72 PEDS (DRAPES) ×2 IMPLANT
ELECT BLADE 4.0 EZ CLEAN MEGAD (MISCELLANEOUS) ×2
ELECT REM PT RETURN 9FT ADLT (ELECTROSURGICAL) ×2
ELECTRODE BLDE 4.0 EZ CLN MEGD (MISCELLANEOUS) ×1 IMPLANT
ELECTRODE REM PT RTRN 9FT ADLT (ELECTROSURGICAL) ×1 IMPLANT
GLOVE BIO SURGEON STRL SZ 6.5 (GLOVE) ×4 IMPLANT
GOWN STRL REUS W/ TWL LRG LVL3 (GOWN DISPOSABLE) ×3 IMPLANT
GOWN STRL REUS W/TWL LRG LVL3 (GOWN DISPOSABLE) ×3
KIT PREVENA INCISION MGT 13 (CANNISTER) ×2 IMPLANT
NEEDLE HYPO 25X1 1.5 SAFETY (NEEDLE) ×2 IMPLANT
NS IRRIG 1000ML POUR BTL (IV SOLUTION) ×2 IMPLANT
PACK BASIN DAY SURGERY FS (CUSTOM PROCEDURE TRAY) ×2 IMPLANT
PENCIL SMOKE EVACUATOR (MISCELLANEOUS) ×2 IMPLANT
SLEEVE SCD COMPRESS KNEE MED (MISCELLANEOUS) ×2 IMPLANT
SPONGE LAP 18X18 RF (DISPOSABLE) ×2 IMPLANT
SURGILUBE 2OZ TUBE FLIPTOP (MISCELLANEOUS) IMPLANT
SUT MNCRL AB 3-0 PS2 18 (SUTURE) ×2 IMPLANT
SUT MNCRL AB 4-0 PS2 18 (SUTURE) ×2 IMPLANT
SUT MON AB 5-0 PS2 18 (SUTURE) ×2 IMPLANT
SUT SILK 3 0 PS 1 (SUTURE) ×2 IMPLANT
SWAB COLLECTION DEVICE MRSA (MISCELLANEOUS) ×2 IMPLANT
SWAB CULTURE ESWAB REG 1ML (MISCELLANEOUS) ×2 IMPLANT
SYR BULB IRRIGATION 50ML (SYRINGE) ×2 IMPLANT
SYR CONTROL 10ML LL (SYRINGE) ×2 IMPLANT
TOWEL GREEN STERILE FF (TOWEL DISPOSABLE) ×4 IMPLANT
TRAY DSU PREP LF (CUSTOM PROCEDURE TRAY) ×2 IMPLANT
TUBE CONNECTING 20X1/4 (TUBING) ×2 IMPLANT
UNDERPAD 30X36 HEAVY ABSORB (UNDERPADS AND DIAPERS) ×2 IMPLANT
YANKAUER SUCT BULB TIP NO VENT (SUCTIONS) ×2 IMPLANT

## 2019-04-01 NOTE — Op Note (Signed)
DATE OF OPERATION: 04/01/2019  LOCATION: [facility]  PREOPERATIVE DIAGNOSIS: left breast wound  POSTOPERATIVE DIAGNOSIS: Same  PROCEDURE: 1. Excision of left breast wound 2 x 6 cm 2.  Release and excision of capsular contracture left breast 2 x 3 cm 3. Layered closure 4. Negative pressure wound therapy applied  SURGEON: [employer], DO  ASSISTANT: Phoebe Sharps, PA  EBL: 5 cc  CONDITION: Stable  COMPLICATIONS: None  INDICATION: The patient, Dorothy Frederick, is a 64 y.o. female born on 1955-07-02, is here for treatment of a left breast wound.  She has a history of breast cancer treated with a mastectomy.  She then was radiated and this is likely the cause of her skin breakdown.   PROCEDURE DETAILS:  The patient was seen prior to surgery and marked.  The IV antibiotics were given. The patient was taken to the operating room and given a general anesthetic. A standard time out was performed and all information was confirmed by those in the room. SCDs were placed.   The left breast was prepped and draped with Betadine.  Local with epinephrine was injected around the periwound area for intraoperative hemostasis and postop pain control.  The wound and fibrotic area was marked.  A 10 blade was used to excise the area for a total of 2 x 6 cm.  The tissue was sent to pathology.  Cultures were obtained.  The pocket was irrigated with antibiotic solution.  Undermining was done for approximately 6 cm in the superior lateral direction and 3 cm inferiorly.  This was done because the tissue was so tight and fibrotic that it needed to be released in order to be closed.  The scar tissue and capsular contracture was excised and consisted of a 2 x 3 cm area.  A #15 drain was placed and secured to the chest wall with a 4-0 silk.  The deep layers were closed with 3-0 Monocryl.  The 4-0 Monocryl was then placed.  The skin was closed with 5-0 Monocryl vertical mattress sutures.  The  Praveena was applied on top of that.  There was an excellent seal. The patient was allowed to wake up and taken to recovery room in stable condition at the end of the case. The family was notified at the end of the case.   The advanced practice practitioner (APP) assisted throughout the case.  The APP was essential in retraction and counter traction when needed to make the case progress smoothly.  This retraction and assistance made it possible to see the tissue plans for the procedure.  The assistance was needed for blood control, tissue re-approximation and assisted with closure of the incision site.  The [place] was signed into law in 2016 which includes the topic of electronic health records.  This provides immediate access to information in MyChart.  This includes consultation notes, operative notes, office notes, lab results and pathology reports.  If you have any questions about what you read please let us know at your next visit or call us at the office.  We are right here with you.

## 2019-04-01 NOTE — Anesthesia Procedure Notes (Signed)
Procedure Name: LMA Insertion Date/Time: 04/01/2019 11:37 AM Performed by: Willa Frater, CRNA Pre-anesthesia Checklist: Patient identified, Emergency Drugs available, Suction available and Patient being monitored Patient Re-evaluated:Patient Re-evaluated prior to induction Oxygen Delivery Method: Circle system utilized Preoxygenation: Pre-oxygenation with 100% oxygen Induction Type: IV induction Ventilation: Mask ventilation without difficulty LMA: LMA inserted LMA Size: 4.0 Number of attempts: 1 Airway Equipment and Method: Bite block Placement Confirmation: positive ETCO2 Tube secured with: Tape Dental Injury: Teeth and Oropharynx as per pre-operative assessment

## 2019-04-01 NOTE — Discharge Instructions (Addendum)
INSTRUCTIONS FOR AFTER SURGERY   You will likely have some questions about what to expect following your operation.  The following information will help you and your family understand what to expect when you are discharged from the hospital.  Following these guidelines will help ensure a smooth recovery and reduce risks of complications.  Postoperative instructions include information on: diet, wound care, medications and physical activity.  AFTER SURGERY Expect to go home after the procedure.  In some cases, you may need to spend one night in the hospital for observation.  DIET This surgery does not require a specific diet.  However, I have to mention that the healthier you eat the better your body can start healing. It is important to increasing your protein intake.  This means limiting the foods with added sugar.  Focus on fruits and vegetables and some meat.  If you have any liposuction during your procedure be sure to drink water.  If your urine is bright yellow, then it is concentrated, and you need to drink more water.  As a general rule after surgery, you should have 8 ounces of water every hour while awake.  If you find you are persistently nauseated or unable to take in liquids let us know.  NO TOBACCO USE or EXPOSURE.  This will slow your healing process and increase the risk of a wound.  WOUND CARE If you have a drain: Clean with baby wipes until the drain is removed.   If you have steri-strips / tape directly attached to your skin leave them in place. It is OK to get these wet.  No baths, pools or hot tubs for two weeks. We close your incision to leave the smallest and best-looking scar. No ointment or creams on your incisions until given the go ahead.  Especially not Neosporin (Too many skin reactions with this one).  A few weeks after surgery you can use Mederma and start massaging the scar. We ask you to wear your binder or sports bra for the first 6 weeks around the clock, including  while sleeping. This provides added comfort and helps reduce the fluid accumulation at the surgery site.  ACTIVITY No heavy lifting until cleared by the doctor.  It is OK to walk and climb stairs. In fact, moving your legs is very important to decrease your risk of a blood clot.  It will also help keep you from getting deconditioned.  Every 1 to 2 hours get up and walk for 5 minutes. This will help with a quicker recovery back to normal.  Let pain be your guide so you don't do too much.  NO, you cannot do the spring cleaning and don't plan on taking care of anyone else.  This is your time for TLC.   WORK Everyone returns to work at different times. As a rough guide, most people take at least 1 - 2 weeks off prior to returning to work. If you need documentation for your job, bring the forms to your postoperative follow up visit.  DRIVING Arrange for someone to bring you home from the hospital.  You may be able to drive a few days after surgery but not while taking any narcotics or valium.  BOWEL MOVEMENTS Constipation can occur after anesthesia and while taking pain medication.  It is important to stay ahead for your comfort.  We recommend taking Milk of Magnesia (2 tablespoons; twice a day) while taking the pain pills.  SEROMA This is fluid your body tried  to put in the surgical site.  This is normal but if it creates excessive pain and swelling let us know.  It usually decreases in a few weeks.  MEDICATIONS and PAIN CONTROL At your preoperative visit for you history and physical you were given the following medications: 1. An antibiotic: Start this medication when you get home and take according to the instructions on the bottle. 2. Zofran 4 mg:  This is to treat nausea and vomiting.  You can take this every 6 hours as needed and only if needed. 3. Norco (hydrocodone/acetaminophen) 5/325 mg:  This is only to be used after you have taken the motrin or the tylenol. Every 8 hours as needed. Over  the counter Medication to take: 4. Ibuprofen (Motrin) 600 mg:  Take this every 6 hours.  If you have additional pain then take 500 mg of the tylenol.  Only take the Norco after you have tried these two. 5. Miralax or stool softener of choice: Take this according to the bottle if you take the [place] Call your surgeon's office if any of the following occur: . Fever 101 degrees F or greater  . Excessive bleeding or fluid from the incision site. . Pain that increases over time without aid from the medications . Redness, warmth, or pus draining from incision sites . Persistent nausea or inability to take in liquids . Severe misshapen area that underwent the operation.   NO TYLENOL PRODUCTS UNTIL  4:00 PM    Post Anesthesia Home Care Instructions  Activity: Get plenty of rest for the remainder of the day. A responsible individual must stay with you for 24 hours following the procedure.  For the next 24 hours, DO NOT: -Drive a car -Paediatric nurse -Drink alcoholic beverages -Take any medication unless instructed by your physician -Make any legal decisions or sign important papers.  Meals: Start with liquid foods such as gelatin or soup. Progress to regular foods as tolerated. Avoid greasy, spicy, heavy foods. If nausea and/or vomiting occur, drink only clear liquids until the nausea and/or vomiting subsides. Call your physician if vomiting continues.  Special Instructions/Symptoms: Your throat may feel dry or sore from the anesthesia or the breathing tube placed in your throat during surgery. If this causes discomfort, gargle with warm salt water. The discomfort should disappear within 24 hours.  If you had a scopolamine patch placed behind your ear for the management of post- operative nausea and/or vomiting:  1. The medication in the patch is effective for 72 hours, after which it should be removed.  Wrap patch in a tissue and discard in the trash. Wash hands  thoroughly with soap and water. 2. You may remove the patch earlier than 72 hours if you experience unpleasant side effects which may include dry mouth, dizziness or visual disturbances. 3. Avoid touching the patch. Wash your hands with soap and water after contact with the patch.                               [facility]  What is the benefit of having a drain?  During surgery your tissue layers are separated.  This raw surface stimulates your body to fill the space with serous fluid.  This is normal but you don't want that fluid to collect and prevent healing.  A fluid collection can also become infected.  The Jackson-Pratt (JP) drain is used to eliminate  this collection of fluid and allow the tissue to heal together.    Jackson-Pratt (JP) bulb    How to care for your drainage and suction unit at home Your drainage catheter will be connected to a collection device. The vacuum caused when the device is compressed allows drainage to collect in the device.    Wendee Copp your hands with soap and water before and after touching the system. . Empty the JP drain every 12 hours once you get home from your procedure. . Record the fluid amount on the record sheet included. . Start with stripping the drain tube to push the clots or excess fluid to the bulb.  Do this by pinching the tube with one hand near your skin.  Then with the other hand squeeze the tubing and work it toward the bulb.  This should be done several times a day.  This may collapse the tube which will correct on its own.   . Use a safety pin to attach your collection device to your clothing so there is no tension on the insertion site.   . If you have drainage at the skin insertion site, you can apply a gauze dressing and secure it with tape. . If the drain falls out, apply a gauze dressing over the drain insertion site and secure with tape.   To empty the collection device:   . Release the  stopper on the top of the collection unit (bulb).  Signa Kell contents into a measuring container such as a plastic medicine cup.  . Record the day and amount of drainage on the attached sheet. . This should be done at least twice a day.    To compress the Jackson-Pratt Bulb:  . Release the stopper at the top of the bulb. Marland Kitchen Squeeze the bulb tightly in your fist, squeezing air out of the bulb.  . Replace the stopper while the bulb is compressed.  . Be careful not to spill the contents when squeezing the bulb. . The drainage will start bright red and turn to pink and then yellow with time. . IMPORTANT: If the bulb is not squeezed before adding the stopper it will not draw out the fluid.  Care for the JP drain site and your skin daily:  . You may shower three days after surgery. . Secure the drain to a ribbon or cloth around your waist while showering so it does not pull out while showering. . Be sure your hands are cleaned with soap and water. . Use a clean wet cotton swab to clean the skin around the drain site.  . Use another cotton swab to place Vaseline or antibiotic ointment on the skin around the drain.     Contact your physician if any of the following occur:  Marland Kitchen The fluid in the bulb becomes cloudy. . Your temperature is greater than 101.4.  Marland Kitchen The incision opens. . If you have drainage at the skin insertion site, you can apply a gauze dressing and secure it with tape. . If the drain falls out, apply a gauze dressing over the drain insertion site and secure with tape.  . You will usually have more drainage when you are active than while you rest or are asleep. If the drainage increases significantly or is bloody call the physician                             Bring this  record with you to each office visit Date  Drainage Volume  Date   Drainage volume

## 2019-04-01 NOTE — Anesthesia Preprocedure Evaluation (Addendum)
Anesthesia Evaluation  Patient identified by MRN, date of birth, ID band Patient awake    Reviewed: Allergy & Precautions, NPO status , Patient's Chart, lab work & pertinent test results  Airway Mallampati: II  TM Distance: >3 FB Neck ROM: Full    Dental  (+) Loose,    Pulmonary COPD,  COPD inhaler, former smoker,    Pulmonary exam normal breath sounds clear to auscultation       Cardiovascular hypertension, + CAD  Normal cardiovascular exam Rhythm:Regular Rate:Normal  ECG: NSR, rate 86  ECHO: 1. Left ventricular ejection fraction, by visual estimation, is 55 to 60%. The left ventricle has normal function. There is mildly increased left ventricular hypertrophy. 2. The average left ventricular global longitudinal strain is -18.5 %. 3. Left ventricular diastolic parameters are consistent with Grade I diastolic dysfunction (impaired relaxation). 4. The left ventricle has no regional wall motion abnormalities. 5. Global right ventricle has normal systolic function.The right ventricular size is normal. No increase in right ventricular wall thickness. 6. Left atrial size was normal. 7. Right atrial size was normal. 8. The mitral valve is normal in structure. Trivial mitral valve regurgitation. No evidence of mitral stenosis. 9. The tricuspid valve is normal in structure. Tricuspid valve regurgitation is trivial. 10. The aortic valve is normal in structure. Aortic valve regurgitation is not visualized. No evidence of aortic valve sclerosis or stenosis. 11. The pulmonic valve was normal in structure. Pulmonic valve regurgitation is trivial. 12. The inferior vena cava is normal in size with greater than 50% respiratory variability, suggesting right atrial pressure of 3 mmHg. 13. TR signal is inadequate for assessing pulmonary artery systolic pressure.   Neuro/Psych PSYCHIATRIC DISORDERS Anxiety Depression negative neurological ROS    GI/Hepatic Neg liver ROS, GERD  Medicated and Controlled,  Endo/Other  Hypothyroidism   Renal/GU negative Renal ROS     Musculoskeletal negative musculoskeletal ROS (+)   Abdominal   Peds  Hematology HLD   Anesthesia Other Findings left breast wound  Reproductive/Obstetrics                            Anesthesia Physical Anesthesia Plan  ASA: III  Anesthesia Plan: General   Post-op Pain Management:    Induction: Intravenous  PONV Risk Score and Plan: 3 and Ondansetron, Dexamethasone, Midazolam and Treatment may vary due to age or medical condition  Airway Management Planned: LMA  Additional Equipment:   Intra-op Plan:   Post-operative Plan: Extubation in OR  Informed Consent: I have reviewed the patients History and Physical, chart, labs and discussed the procedure including the risks, benefits and alternatives for the proposed anesthesia with the patient or authorized representative who has indicated his/her understanding and acceptance.     Dental advisory given  Plan Discussed with: CRNA  Anesthesia Plan Comments:        Anesthesia Quick Evaluation

## 2019-04-01 NOTE — Anesthesia Postprocedure Evaluation (Signed)
Anesthesia Post Note  Patient: Dorothy Frederick  Procedure(s) Performed: Left breast wound excision and closure (Left Breast) Placement of provena wound vac (Left Breast)     Patient location during evaluation: PACU Anesthesia Type: General Level of consciousness: awake and alert Pain management: pain level controlled Vital Signs Assessment: post-procedure vital signs reviewed and stable Respiratory status: spontaneous breathing, nonlabored ventilation, respiratory function stable and patient connected to nasal cannula oxygen Cardiovascular status: blood pressure returned to baseline and stable Postop Assessment: no apparent nausea or vomiting Anesthetic complications: no    Last Vitals:  Vitals:   04/01/19 1400 04/01/19 1430  BP: (!) 131/55 (!) 126/59  Pulse: 68 66  Resp:  16  Temp:  36.6 C  SpO2: 98% 98%    Last Pain:  Vitals:   04/01/19 1430  TempSrc:   PainSc: 0-No pain                 Arham Symmonds P Deshawn Skelley

## 2019-04-01 NOTE — Transfer of Care (Signed)
Immediate Anesthesia Transfer of Care Note  Patient: Marlynn Hinckley  Procedure(s) Performed: Left breast wound excision and closure (Left Breast) Placement of provena wound vac (Left Breast)  Patient Location: PACU  Anesthesia Type:General  Level of Consciousness: awake, alert , oriented and drowsy  Airway & Oxygen Therapy: Patient Spontanous Breathing and Patient connected to face mask oxygen  Post-op Assessment: Report given to RN and Post -op Vital signs reviewed and stable  Post vital signs: Reviewed and stable  Last Vitals:  Vitals Value Taken Time  BP    Temp    Pulse 81 04/01/19 1242  Resp 19 04/01/19 1242  SpO2 100 % 04/01/19 1242  Vitals shown include unvalidated device data.  Last Pain:  Vitals:   04/01/19 0958  TempSrc: Oral  PainSc: 0-No pain      Patients Stated Pain Goal: 8 ([id number])  Complications: No apparent anesthesia complications

## 2019-04-01 NOTE — Interval H&P Note (Signed)
History and Physical Interval Note:  04/01/2019 11:00 AM  Dorothy Frederick  has presented today for surgery, with the diagnosis of left breast wound.  The various methods of treatment have been discussed with the patient and family. After consideration of risks, benefits and other options for treatment, the patient has consented to  Procedure(s) with comments: Left breast wound excision and possible closure (Left) - 45 min, please with placement of Primatrix or Strattice and vac placement (Left) as a surgical intervention.  The patient's history has been reviewed, patient examined, no change in status, stable for surgery.  I have reviewed the patient's chart and labs.  Questions were answered to the patient's satisfaction.     Loel Lofty Chemika Nightengale

## 2019-04-02 ENCOUNTER — Encounter: Payer: Self-pay | Admitting: *Deleted

## 2019-04-05 LAB — SURGICAL PATHOLOGY

## 2019-04-06 LAB — AEROBIC/ANAEROBIC CULTURE W GRAM STAIN (SURGICAL/DEEP WOUND)

## 2019-04-07 ENCOUNTER — Inpatient Hospital Stay: Payer: BC Managed Care – PPO

## 2019-04-07 ENCOUNTER — Inpatient Hospital Stay: Payer: BC Managed Care – PPO | Attending: Oncology

## 2019-04-07 ENCOUNTER — Other Ambulatory Visit: Payer: Self-pay

## 2019-04-07 VITALS — BP 121/53 | HR 77 | Temp 98.7°F | Resp 18

## 2019-04-07 DIAGNOSIS — G629 Polyneuropathy, unspecified: Secondary | ICD-10-CM | POA: Diagnosis not present

## 2019-04-07 DIAGNOSIS — Z9012 Acquired absence of left breast and nipple: Secondary | ICD-10-CM | POA: Insufficient documentation

## 2019-04-07 DIAGNOSIS — I129 Hypertensive chronic kidney disease with stage 1 through stage 4 chronic kidney disease, or unspecified chronic kidney disease: Secondary | ICD-10-CM | POA: Insufficient documentation

## 2019-04-07 DIAGNOSIS — F17210 Nicotine dependence, cigarettes, uncomplicated: Secondary | ICD-10-CM | POA: Diagnosis not present

## 2019-04-07 DIAGNOSIS — C50412 Malignant neoplasm of upper-outer quadrant of left female breast: Secondary | ICD-10-CM | POA: Diagnosis not present

## 2019-04-07 DIAGNOSIS — Z5111 Encounter for antineoplastic chemotherapy: Secondary | ICD-10-CM | POA: Insufficient documentation

## 2019-04-07 DIAGNOSIS — E876 Hypokalemia: Secondary | ICD-10-CM | POA: Insufficient documentation

## 2019-04-07 DIAGNOSIS — Z171 Estrogen receptor negative status [ER-]: Secondary | ICD-10-CM | POA: Insufficient documentation

## 2019-04-07 DIAGNOSIS — N184 Chronic kidney disease, stage 4 (severe): Secondary | ICD-10-CM | POA: Insufficient documentation

## 2019-04-07 DIAGNOSIS — I158 Other secondary hypertension: Secondary | ICD-10-CM

## 2019-04-07 DIAGNOSIS — Z95828 Presence of other vascular implants and grafts: Secondary | ICD-10-CM

## 2019-04-07 DIAGNOSIS — Z79899 Other long term (current) drug therapy: Secondary | ICD-10-CM | POA: Insufficient documentation

## 2019-04-07 LAB — CBC WITH DIFFERENTIAL/PLATELET
Abs Immature Granulocytes: 0.04 10*3/uL (ref 0.00–0.07)
Basophils Absolute: 0.1 10*3/uL (ref 0.0–0.1)
Basophils Relative: 1 %
Eosinophils Absolute: 0.2 10*3/uL (ref 0.0–0.5)
Eosinophils Relative: 3 %
HCT: 37.2 % (ref 36.0–46.0)
Hemoglobin: 12 g/dL (ref 12.0–15.0)
Immature Granulocytes: 1 %
Lymphocytes Relative: 18 %
Lymphs Abs: 1.5 10*3/uL (ref 0.7–4.0)
MCH: 28.2 pg (ref 26.0–34.0)
MCHC: 32.3 g/dL (ref 30.0–36.0)
MCV: 87.3 fL (ref 80.0–100.0)
Monocytes Absolute: 0.8 10*3/uL (ref 0.1–1.0)
Monocytes Relative: 10 %
Neutro Abs: 5.8 10*3/uL (ref 1.7–7.7)
Neutrophils Relative %: 67 %
Platelets: 319 10*3/uL (ref 150–400)
RBC: 4.26 MIL/uL (ref 3.87–5.11)
RDW: 14.5 % (ref 11.5–15.5)
WBC: 8.4 10*3/uL (ref 4.0–10.5)
nRBC: 0 % (ref 0.0–0.2)

## 2019-04-07 LAB — COMPREHENSIVE METABOLIC PANEL
ALT: 16 U/L (ref 0–44)
AST: 16 U/L (ref 15–41)
Albumin: 3.4 g/dL — ABNORMAL LOW (ref 3.5–5.0)
Alkaline Phosphatase: 103 U/L (ref 38–126)
Anion gap: 8 (ref 5–15)
BUN: 15 mg/dL (ref 8–23)
CO2: 23 mmol/L (ref 22–32)
Calcium: 8.9 mg/dL (ref 8.9–10.3)
Chloride: 106 mmol/L (ref 98–111)
Creatinine, Ser: 0.94 mg/dL (ref 0.44–1.00)
GFR calc Af Amer: >60 mL/min (ref >60)
GFR calc non Af Amer: >60 mL/min (ref >60)
Glucose, Bld: 97 mg/dL (ref 70–99)
Potassium: 4.1 mmol/L (ref 3.5–5.1)
Sodium: 137 mmol/L (ref 135–145)
Total Bilirubin: 0.5 mg/dL (ref 0.3–1.2)
Total Protein: 7.3 g/dL (ref 6.5–8.1)

## 2019-04-07 MED ORDER — SODIUM CHLORIDE 0.9 % IV SOLN
Freq: Once | INTRAVENOUS | Status: AC
  Filled 2019-04-07: qty 250

## 2019-04-07 MED ORDER — ACETAMINOPHEN 325 MG PO TABS
650.0000 mg | ORAL_TABLET | Freq: Once | ORAL | Status: AC
  Administered 2019-04-07: 650 mg via ORAL

## 2019-04-07 MED ORDER — SODIUM CHLORIDE 0.9% FLUSH
10.0000 mL | INTRAVENOUS | Status: DC | PRN
  Administered 2019-04-07: 10 mL
  Filled 2019-04-07: qty 10

## 2019-04-07 MED ORDER — DIPHENHYDRAMINE HCL 25 MG PO CAPS
25.0000 mg | ORAL_CAPSULE | Freq: Once | ORAL | Status: AC
  Administered 2019-04-07: 25 mg via ORAL

## 2019-04-07 MED ORDER — ACETAMINOPHEN 325 MG PO TABS
ORAL_TABLET | ORAL | Status: AC
  Filled 2019-04-07: qty 2

## 2019-04-07 MED ORDER — DIPHENHYDRAMINE HCL 25 MG PO CAPS
ORAL_CAPSULE | ORAL | Status: AC
  Filled 2019-04-07: qty 1

## 2019-04-07 MED ORDER — TRASTUZUMAB-DKST CHEMO 150 MG IV SOLR
450.0000 mg | Freq: Once | INTRAVENOUS | Status: AC
  Administered 2019-04-07: 450 mg via INTRAVENOUS
  Filled 2019-04-07: qty 21.43

## 2019-04-07 MED ORDER — HEPARIN SOD (PORK) LOCK FLUSH 100 UNIT/ML IV SOLN
500.0000 [IU] | Freq: Once | INTRAVENOUS | Status: AC | PRN
  Administered 2019-04-07: 500 [IU]
  Filled 2019-04-07: qty 5

## 2019-04-07 NOTE — Patient Instructions (Signed)
[facility] Discharge Instructions for Patients Receiving Chemotherapy  Today you received the following chemotherapy agents trastuzumab.  To help prevent nausea and vomiting after your treatment, we encourage you to take your nausea medication as directed.    If you develop nausea and vomiting that is not controlled by your nausea medication, call the clinic.   BELOW ARE SYMPTOMS THAT SHOULD BE REPORTED IMMEDIATELY:  *FEVER GREATER THAN 100.5 F  *CHILLS WITH OR WITHOUT FEVER  NAUSEA AND VOMITING THAT IS NOT CONTROLLED WITH YOUR NAUSEA MEDICATION  *UNUSUAL SHORTNESS OF BREATH  *UNUSUAL BRUISING OR BLEEDING  TENDERNESS IN MOUTH AND THROAT WITH OR WITHOUT PRESENCE OF ULCERS  *URINARY PROBLEMS  *BOWEL PROBLEMS  UNUSUAL RASH Items with * indicate a potential emergency and should be followed up as soon as possible.  Feel free to call the clinic should you have any questions or concerns. The clinic phone number is [phone].  Please show the CHEMO ALERT CARD at check-in to the Emergency Department and triage nurse.   

## 2019-04-08 ENCOUNTER — Telehealth: Payer: Self-pay

## 2019-04-08 NOTE — Telephone Encounter (Signed)
RN spoke with patient, patient reporting blurred vision since infusion of trastuzumab on 3/17.    Pt reports that she typically experiences blurred vision the day after infusions, however today increase in length and severity.   Pt denies any headache, dizziness, or chest pain.   RN reviewed with MD - MD recommendations that side effect could potentially be from the pre-medication Benadryl - recommendations to remove from treatment plan.  If vision is significantly worse or not improve patient to call first thing tomorrow morning.   RN reviewed with patient, pt verbalized understanding and agreement.  Pt reports that she does have family in house with her, RN educated on safety.

## 2019-04-08 NOTE — Progress Notes (Signed)
Patient is a 64 year old female here for follow-up after excision of left breast wound, release and excision of capsular contracture of the left breast, and application of wound VAC on 04/01/2019 with Dr. Marla Roe.  She has a history of breast cancer and radiation treatment on this side.  Patient feels she is doing well overall.  Reports tenderness around the drain site.  Praveena VAC completed and shut off this morning.  VAC removed.  Incision is healing well, C/D/I.  No signs of infection, redness, drainage, seroma/hematoma.  Drain in place with approximately 18 to 20 cc serosanguineous fluid output per day.  Drain site shows irritation redness.  No signs of infection.  Anticipate drain removal next week.  Aerobic/anaerobic culture results: Organism staph Aureus identified. Susceptibility to Cipro.   Rx sent to pharmacy for Cipro.   Follow-up next week for drain removal.  Call office for any questions/concerns or if condition worsens.  The [place] was signed into law in 2016 which includes the topic of electronic health records.  This provides immediate access to information in MyChart.  This includes consultation notes, operative notes, office notes, lab results and pathology reports.  If you have any questions about what you read please let us know at your next visit or call us at the office.  We are right here with you.

## 2019-04-09 ENCOUNTER — Ambulatory Visit (INDEPENDENT_AMBULATORY_CARE_PROVIDER_SITE_OTHER): Payer: BC Managed Care – PPO | Admitting: Plastic Surgery

## 2019-04-09 ENCOUNTER — Other Ambulatory Visit: Payer: Self-pay

## 2019-04-09 ENCOUNTER — Encounter: Payer: Self-pay | Admitting: Plastic Surgery

## 2019-04-09 VITALS — BP 118/71 | HR 78 | Temp 97.5°F | Ht 63.0 in | Wt 158.2 lb

## 2019-04-09 DIAGNOSIS — C50412 Malignant neoplasm of upper-outer quadrant of left female breast: Secondary | ICD-10-CM

## 2019-04-09 DIAGNOSIS — Z171 Estrogen receptor negative status [ER-]: Secondary | ICD-10-CM

## 2019-04-09 MED ORDER — CIPROFLOXACIN HCL 500 MG PO TABS: 500.0000 mg | ORAL_TABLET | Freq: Two times a day (BID) | ORAL | 0 refills | Status: AC

## 2019-04-15 ENCOUNTER — Encounter: Payer: Self-pay | Admitting: Surgical

## 2019-04-15 ENCOUNTER — Other Ambulatory Visit: Payer: Self-pay

## 2019-04-15 ENCOUNTER — Ambulatory Visit (INDEPENDENT_AMBULATORY_CARE_PROVIDER_SITE_OTHER): Payer: BC Managed Care – PPO | Admitting: Surgical

## 2019-04-15 VITALS — BP 138/73 | HR 67 | Temp 97.1°F | Ht 63.0 in | Wt 158.0 lb

## 2019-04-15 DIAGNOSIS — S21102A Unspecified open wound of left front wall of thorax without penetration into thoracic cavity, initial encounter: Secondary | ICD-10-CM | POA: Diagnosis not present

## 2019-04-15 DIAGNOSIS — Z171 Estrogen receptor negative status [ER-]: Secondary | ICD-10-CM | POA: Diagnosis not present

## 2019-04-15 DIAGNOSIS — Z853 Personal history of malignant neoplasm of breast: Secondary | ICD-10-CM | POA: Diagnosis not present

## 2019-04-15 DIAGNOSIS — C50412 Malignant neoplasm of upper-outer quadrant of left female breast: Secondary | ICD-10-CM | POA: Diagnosis not present

## 2019-04-15 NOTE — Progress Notes (Signed)
The patient is a 64 year old female here for follow-up after left breast wound excision and closure and placement of Prevena wound VAC to left breast on 04/01/2019 with Dr. Marla Roe. Patient is here today for follow-up and assessment for possible removal of left breast JP drain.  She has a history of radiation to her left chest in 2000.  She is also undergoing chemotherapy with trastuzumab at this time until September 21.  Patient previously seen on 3/19 by Venetia Night and was prescribed 5 days of Cipro for coverage of staph aureus that grew from left breast wound.  Today she is doing well.  No complaints.  No fever, chills, nausea, vomiting.  Left JP drain output has been approximately 15-18 cc per day of serous fluid.   On exam her left chest wall incision is nicely healed, Monocryl suture knots noted.  Incision is C/D/I.  No drainage noted.  Left JP drain in place with approximately 5 cc of serous fluid in bulb.  No tenderness to palpation.  No erythema.  No fluid wave or fluid collections noted.    JP drain removed today in office.  Vaseline and gauze placed over drain site.  Recommend changing this for a few days until it has completely healed.  No sign of seroma or hematoma.  Patient is doing well.  Follow-up in 1 month for reevaluation.  Call with any questions or concerns prior to next visit.  Patient is in agreement with current plan.

## 2019-04-16 ENCOUNTER — Ambulatory Visit: Payer: BC Managed Care – PPO | Admitting: Plastic Surgery

## 2019-04-21 NOTE — Progress Notes (Signed)
Pharmacist Chemotherapy Monitoring - Follow Up Assessment    I verify that I have reviewed each item in the below checklist:  . Regimen for the patient is scheduled for the appropriate day and plan matches scheduled date. Marland Kitchen Appropriate non-routine labs are ordered dependent on drug ordered. . If applicable, additional medications reviewed and ordered per protocol based on lifetime cumulative doses and/or treatment regimen.   Plan for follow-up and/or issues identified: Yes . I-vent associated with next due treatment: Yes . MD and/or nursing notified: No  Dorothy Frederick 04/21/2019 4:13 PM

## 2019-04-27 ENCOUNTER — Encounter: Payer: Self-pay | Admitting: *Deleted

## 2019-04-27 NOTE — Progress Notes (Signed)
[place]  [phone] [phone]    ID: Dorothy Frederick DOB: 10-Oct-1955  MR#: [id number]  MOL#:[id number]  Patient Care Team: [name], MD as PCP - General (Family Medicine) Skeet Latch, MD as PCP - Cardiology (Cardiology) Mauro Kaufmann, RN as Oncology Nurse Navigator Rockwell Germany, RN as Oncology Nurse Navigator Fanny Skates, MD as Consulting Physician (General Surgery) Alfredo Spong, Virgie Dad, MD as Consulting Physician (Oncology) Gery Pray, MD as Consulting Physician (Radiation Oncology) Croitoru, Dani Gobble, MD as Consulting Physician (Cardiology) OTHER MD:   CHIEF COMPLAINT: Estrogen receptor negative breast cancer  CURRENT TREATMENT: Trastuzumab   INTERVAL HISTORY: Dorothy Frederick returns today for follow-up and treatment of her estrogen receptor negative breast cancer.   She continues on trastuzumab. Her most recent echocardiogram on 01/11/2019 showed an ejection fraction of 55-60%.  She has had no cardiac symptoms.  She is due for her next echo.  Since her last visit, she underwent left breast wound excision on 04/01/2019 under Dr. Marla Roe. Pathology from the procedure [id number]) showed: inflamed fibroadipose tissue; no evidence of malignancy.  Aerobic/anaerobic culture from the wound showed: organism staph Aureus identified; susceptibility to Cipro. She was seen by plastic surgery on 04/09/2019 and prescribed Cipro.   REVIEW OF SYSTEMS: Dorothy Frederick feels her wound finally looks and feels like her wound should feel.  She got through the antibiotics and she postponed receiving the Covid vaccine because of the whole infectious issue but now she is ready to proceed.  She has started a walking program, walking about 1/4 mile twice a day.  Her eyes are better but she is having difficulty with far vision and she thinks she probably needs new glasses.  The issue is not simply accommodation.  Aside from all this a detailed review of systems  today was noncontributory   HISTORY OF CURRENT ILLNESS: From the original intake note:  Dorothy Frederick has a history of left breast cancer diagnosed in 2000, and treated with a partial mastectomy.  She has been followed by Dr. Matthew Saras in [facility]. She was also treated with Tamoxifen for 1.5 years, which she tolerated well, but had to discontinue due to financial difficulties. She did not receive chemotherapy. She notes that her cancer was lymph node negative.  More recently, she had routine screening mammography on 07/06/2018 showing: Breast Density Category B. On mammography, there is a 1.5 cm oval mass with associated calcifications in the left breast upper outer quadrant posterior depth 6.6 cm from the nipple. This mass is approximately 3 cm anterior to the lumpectomy site in the upper outer posterior left breast. No other significant masses, calcifications, or other findings are seen in either breast.   Accordingly on 07/13/2018 she proceeded to biopsy of the left breast area in question. The pathology from this procedure showed ([id number]): invasive ductal carcinoma, grade III, 2 o'clock, 5 cm from the nipple. Prognostic indicators significant for: estrogen receptor, 0% negative and progesterone receptor, 0% negative. Proliferation marker Ki67 at 70%. HER2 positive (3+) by immunohistochemistry.  The patient's subsequent history is as detailed below.   PAST MEDICAL HISTORY: Past Medical History:  Diagnosis Date   Angina at rest [facility])    Anxiety    Cancer ([place]) 2000   left breast cancer-partial mastectomy   COPD (chronic obstructive pulmonary disease) ([place])    Depression    Family history of breast cancer    Family history of esophageal cancer    Family history of lung cancer    Family history  of prostate cancer    GERD (gastroesophageal reflux disease)    Hyperlipidemia    Hypertension    Hypothyroidism    Recurrent breast cancer, left ([place])     PAST SURGICAL  HISTORY: Past Surgical History:  Procedure Laterality Date   ADENOIDECTOMY     APPLICATION OF WOUND VAC Left 04/01/2019   Procedure: Placement of provena wound vac;  Surgeon: Wallace Going, DO;  Location: [place];  Service: Plastics;  Laterality: Left;   DEBRIDEMENT AND CLOSURE WOUND Left 04/01/2019   Procedure: Left breast wound excision and closure;  Surgeon: Wallace Going, DO;  Location: [place];  Service: Plastics;  Laterality: Left;   LEFT HEART CATHETERIZATION WITH CORONARY ANGIOGRAM N/A 10/21/2013   Procedure: LEFT HEART CATHETERIZATION WITH CORONARY ANGIOGRAM;  Surgeon: Burnell Blanks, MD;  Location: [facility] CATH LAB;  Service: Cardiovascular;  Laterality: N/A;   MASTECTOMY W/ SENTINEL NODE BIOPSY Left 09/16/2018   Procedure: LEFT TOTAL MASTECTOMY WITH LEFT AXILLARY DEEP SENTINEL LYMPH NODE BIOPSY AND INJECT BLUE DYE;  Surgeon: Fanny Skates, MD;  Location: [place];  Service: General;  Laterality: Left;   MASTECTOMY, PARTIAL Left 2000   PORTACATH PLACEMENT Right 09/16/2018   Procedure: INSERTION PORT-A-CATH;  Surgeon: Fanny Skates, MD;  Location: [place];  Service: General;  Laterality: Right;   TONSILLECTOMY      FAMILY HISTORY: Family History  Problem Relation Age of Onset   Heart disease Mother    Hypertension Father    Cancer Father    Prostate cancer Father    Hypertension Sister    Breast cancer Sister        dx 72s   Heart disease Sister    Hypertension Brother    Hyperlipidemia Maternal Grandmother    Stroke Maternal Grandmother    Hypertension Brother    Breast cancer Maternal Aunt    Lung cancer Maternal Aunt    Esophageal cancer Maternal Aunt    Stomach cancer Paternal Grandmother    Roise's father died from prostate cancer at age 70. Patients' mother died from heart complications at age 33. The patient has 2 half brothers and 2 half sisters. Patient  denies anyone in her family having ovarian, prostate, or pancreatic cancer. 1 maternal aunt and 68 half aunt (half-sister of the patient's mother) had breast cancer breast cancer.    GYNECOLOGIC HISTORY:  No LMP recorded. Patient is postmenopausal. 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 She is divorced. She lives with her daughter and two grandchildren. Her daughter, Judson Roch, is 90 and works for [employer] as a Clinical research associate.  Sarah's children Piper, 13, and Conner, 10 also live with the patient. Kelita's other daughter, Sharyn Lull, is a Licensed conveyancer at [employer]. Othella has 4 grandchildren in total.   ADVANCED DIRECTIVES:  Not in place. Deiona was given the appropriate forms on 07/22/18 to fill out and return at their own discretion.     HEALTH MAINTENANCE: Social History   Tobacco Use   Smoking status: Former Smoker    Packs/day: 1.00    Years: 38.00    Pack years: 38.00    Types: Cigarettes   Smokeless tobacco: Never Used  Substance Use Topics   Alcohol use: No   Drug  use: No    Colonoscopy: 2017  PAP:    Bone density: yes, [facility]; osteoporosis Mammography: up to date  Allergies  Allergen Reactions   Benadryl [Diphenhydramine Hcl (Sleep)] Palpitations    Increased heartrate   Sulfa Antibiotics Rash    Current Outpatient Medications  Medication Sig Dispense Refill   albuterol (PROVENTIL HFA;VENTOLIN HFA) 108 (90 BASE) MCG/ACT inhaler Inhale 2 puffs into the lungs every 6 (six) hours as needed for wheezing or shortness of breath.     alendronate (FOSAMAX) 70 MG tablet Take 1 tablet by mouth once a week.     atorvastatin (LIPITOR) 20 MG tablet Take 20 mg by mouth at bedtime.     Cholecalciferol 1000 UNITS capsule Take 500 Units by mouth daily.       escitalopram (LEXAPRO) 10 MG tablet Take 10 mg by mouth daily.  3   famotidine (PEPCID) 40 MG tablet Take 40 mg by mouth daily.     Fluticasone-Umeclidin-Vilant (TRELEGY ELLIPTA) 100-62.5-25 MCG/INH AEPB Inhale 1 puff into the lungs daily as needed.     levothyroxine (SYNTHROID, LEVOTHROID) 50 MCG tablet Take 1 tablet by mouth daily.     LORazepam (ATIVAN) 0.5 MG tablet Take 1 tablet (0.5 mg total) by mouth at bedtime as needed (Nausea or vomiting). 30 tablet 0   No current facility-administered medications for this visit.   Facility-Administered Medications Ordered in Other Visits  Medication Dose Route Frequency Provider Last Rate Last Admin   0.9 %  sodium chloride infusion   Intravenous Continuous Causey, Charlestine Massed, NP   Stopped at 12/24/18 1548     OBJECTIVE: white woman who appears stated age  36:   04/28/19 1142  BP: 123/65  Pulse: (!) 56  Resp: 18  Temp: 98.5 F (36.9 C)  SpO2: 100%   Wt Readings from Last 3 Encounters:  04/28/19 159 lb 1.6 oz (72.2 kg)  04/15/19 158 lb (71.7 kg)  04/09/19 158 lb 3.2 oz (71.8 kg)   Body mass index is 28.18 kg/m.    ECOG FS:1 - Symptomatic but completely ambulatory  Hair is coming back nicely, not to curly Sclerae unicteric, EOMs intact Wearing a mask No cervical or supraclavicular adenopathy Lungs no rales or rhonchi Heart regular rate and rhythm Abd soft, nontender, positive bowel sounds MSK no focal spinal tenderness, no upper extremity lymphedema and good left upper extremity range of motion Neuro: nonfocal, well oriented, appropriate affect Breasts: The right breast is unremarkable.  The left breast is status post mastectomy and reexcision.  The incision is healed very nicely and there is no evidence of local recurrence   LAB RESULTS:  CMP     Component Value Date/Time   NA 137 04/07/2019 1353   K 4.1 04/07/2019 1353   CL 106 04/07/2019 1353   CO2 23 04/07/2019 1353   GLUCOSE 97 04/07/2019 1353    BUN 15 04/07/2019 1353   CREATININE 0.94 04/07/2019 1353   CREATININE 1.11 (H) 07/22/2018 0835   CREATININE 0.97 10/18/2013 1527   CALCIUM 8.9 04/07/2019 1353   PROT 7.3 04/07/2019 1353   ALBUMIN 3.4 (L) 04/07/2019 1353   AST 16 04/07/2019 1353   AST 17 07/22/2018 0835   ALT 16 04/07/2019 1353   ALT 20 07/22/2018 0835   ALKPHOS 103 04/07/2019 1353   BILITOT 0.5 04/07/2019 1353   BILITOT 0.6 07/22/2018 0835   GFRNONAA >60 04/07/2019 1353   GFRNONAA 53 (L) 07/22/2018 0835   GFRAA >60 04/07/2019 1353   GFRAA >  60 07/22/2018 0835    No results found for: TOTALPROTELP, ALBUMINELP, A1GS, A2GS, BETS, BETA2SER, GAMS, MSPIKE, SPEI  No results found for: KPAFRELGTCHN, LAMBDASER, [facility]  Lab Results  Component Value Date   WBC 4.6 04/28/2019   NEUTROABS 2.5 04/28/2019   HGB 12.3 04/28/2019   HCT 38.5 04/28/2019   MCV 87.1 04/28/2019   PLT 250 04/28/2019    Lab Results  Component Value Date   LABCA2 24 09/08/2006    No components found for: [id number]  No results for input(s): INR in the last 168 hours.  Lab Results  Component Value Date   LABCA2 24 09/08/2006    No results found for: OZH086  No results found for: VHQ469  No results found for: GEX528  No results found for: CA2729  No components found for: HGQUANT  No results found for: CEA1 / No results found for: CEA1   No results found for: AFPTUMOR  No results found for: CHROMOGRNA  No results found for: HGBA, HGBA2QUANT, HGBFQUANT, HGBSQUAN (Hemoglobinopathy evaluation)   No results found for: LDH  No results found for: IRON, TIBC, IRONPCTSAT (Iron and TIBC)  No results found for: FERRITIN   Urinalysis No results found for: COLORURINE, APPEARANCEUR, LABSPEC, PHURINE, GLUCOSEU, HGBUR, BILIRUBINUR, KETONESUR, PROTEINUR, UROBILINOGEN, NITRITE, LEUKOCYTESUR   STUDIES:  No results found.   ELIGIBLE FOR AVAILABLE RESEARCH PROTOCOL: no   ASSESSMENT: 64 y.o. Dorothy Frederick, Dorothy Frederick woman status post  left breast upper outer quadrant biopsy 07/13/2018 for a clinical T1c N0, stage IA invasive ductal carcinoma, grade 3, estrogen and progesterone receptor negative, HER-2 amplified, with an MIB-1 of 70%  (1) status post left mastectomy with sentinel lymph node sampling 09/16/2018 for a pT2, pN0, stage IIA invasive ductal carcinoma, grade 3, with negative margins  (a) a total of 6 sentinel lymph nodes were removed  (b) the patient opted against reconstruction  (c) status post wound debridement 04/01/2019 with benign pathology  (2) adjuvant chemotherapy with paclitaxel and trastuzumab weekly x12 started on 10/21/2018  (a) paclitaxel discontinued after 8 cycles due to peripheral neuropathy.  (3) trastuzumab to be continued every 21 days to total 1 year  (a) echo 07/27/2018 shows an ejection fraction in the 55-60% range  (b) echo 01/11/2019 shows an ejection fraction in the 55-60% range  (4) no indication for postmastectomy radiation  (5) genetic testing on 07/22/2018 through the [facility] Common Hereditary cancer panel found no pathogenic mutations.in APC, ATM, AXIN2, BARD1, BMPR1A, BRCA1, BRCA2, BRIP1, CDH1, CDKN2A (p14ARF), CDKN2A (p16INK4a), CKD4, CHEK2, CTNNA1, DICER1, EPCAM (Deletion/duplication testing only), GREM1 (promoter region deletion/duplication testing only), KIT, MEN1, MLH1, MSH2, MSH3, MSH6, MUTYH, NBN, NF1, NHTL1, PALB2, PDGFRA, PMS2, POLD1, POLE, PTEN, RAD50, RAD51C, RAD51D, SDHB, SDHC, SDHD, SMAD4, SMARCA4. STK11, TP53, TSC1, TSC2, and VHL.  The following genes were evaluated for sequence changes only: SDHA and HOXB13 c.251G>A variant only.   PLAN: Cammi is now a little over half a year from definitive surgery for her breast cancer with no evidence of disease recurrence.  This is favorable.  She is tolerating the trastuzumab well and the plan is to continue it to complete a year which will take [place] through August of this year.  She is due for an echo and I have entered the appropriate  order  I encouraged her to continue and intensify her exercise program.  I also suggested she is now ready to receive her Covid vaccine and I recommended either the Paint or Materna ones  I wrote her a prescription for  bras and prostheses.  She will see me again in 9 weeks.  She knows to call for any other issue that may develop before then.  Total encounter time 30 minutes.Sarajane Jews C. Angelo Prindle, MD  04/28/19 12:04 PM Medical Oncology and Hematology [facility] [place], [place] [address] Tel. [phone]    Fax. [phone]   I, Wilburn Mylar, am acting as scribe for Dr. Virgie Dad. Aashna Matson.  I, Lurline Del MD, have reviewed the above documentation for accuracy and completeness, and I agree with the above.   *Total Encounter Time as defined by the Centers for Medicare and Medicaid Services includes, in addition to the face-to-face time of a patient visit (documented in the note above) non-face-to-face time: obtaining and reviewing outside history, ordering and reviewing medications, tests or procedures, care coordination (communications with other health care professionals or caregivers) and documentation in the medical record.

## 2019-04-28 ENCOUNTER — Inpatient Hospital Stay: Payer: BC Managed Care – PPO

## 2019-04-28 ENCOUNTER — Inpatient Hospital Stay (HOSPITAL_BASED_OUTPATIENT_CLINIC_OR_DEPARTMENT_OTHER): Payer: BC Managed Care – PPO | Admitting: Oncology

## 2019-04-28 ENCOUNTER — Inpatient Hospital Stay: Payer: BC Managed Care – PPO | Attending: Oncology

## 2019-04-28 ENCOUNTER — Other Ambulatory Visit: Payer: Self-pay

## 2019-04-28 VITALS — BP 123/65 | HR 56 | Temp 98.5°F | Resp 18 | Ht 63.0 in | Wt 159.1 lb

## 2019-04-28 DIAGNOSIS — G629 Polyneuropathy, unspecified: Secondary | ICD-10-CM | POA: Insufficient documentation

## 2019-04-28 DIAGNOSIS — Z79899 Other long term (current) drug therapy: Secondary | ICD-10-CM | POA: Insufficient documentation

## 2019-04-28 DIAGNOSIS — C50412 Malignant neoplasm of upper-outer quadrant of left female breast: Secondary | ICD-10-CM

## 2019-04-28 DIAGNOSIS — N184 Chronic kidney disease, stage 4 (severe): Secondary | ICD-10-CM | POA: Insufficient documentation

## 2019-04-28 DIAGNOSIS — Z5111 Encounter for antineoplastic chemotherapy: Secondary | ICD-10-CM | POA: Insufficient documentation

## 2019-04-28 DIAGNOSIS — Z171 Estrogen receptor negative status [ER-]: Secondary | ICD-10-CM

## 2019-04-28 DIAGNOSIS — I129 Hypertensive chronic kidney disease with stage 1 through stage 4 chronic kidney disease, or unspecified chronic kidney disease: Secondary | ICD-10-CM | POA: Insufficient documentation

## 2019-04-28 DIAGNOSIS — I158 Other secondary hypertension: Secondary | ICD-10-CM

## 2019-04-28 DIAGNOSIS — F17210 Nicotine dependence, cigarettes, uncomplicated: Secondary | ICD-10-CM | POA: Insufficient documentation

## 2019-04-28 DIAGNOSIS — E876 Hypokalemia: Secondary | ICD-10-CM | POA: Insufficient documentation

## 2019-04-28 DIAGNOSIS — Z95828 Presence of other vascular implants and grafts: Secondary | ICD-10-CM

## 2019-04-28 DIAGNOSIS — Z9012 Acquired absence of left breast and nipple: Secondary | ICD-10-CM | POA: Insufficient documentation

## 2019-04-28 LAB — COMPREHENSIVE METABOLIC PANEL
ALT: 28 U/L (ref 0–44)
AST: 21 U/L (ref 15–41)
Albumin: 3.7 g/dL (ref 3.5–5.0)
Alkaline Phosphatase: 105 U/L (ref 38–126)
Anion gap: 10 (ref 5–15)
BUN: 16 mg/dL (ref 8–23)
CO2: 23 mmol/L (ref 22–32)
Calcium: 9.2 mg/dL (ref 8.9–10.3)
Chloride: 109 mmol/L (ref 98–111)
Creatinine, Ser: 0.93 mg/dL (ref 0.44–1.00)
GFR calc Af Amer: >60 mL/min (ref >60)
GFR calc non Af Amer: >60 mL/min (ref >60)
Glucose, Bld: 95 mg/dL (ref 70–99)
Potassium: 3.8 mmol/L (ref 3.5–5.1)
Sodium: 142 mmol/L (ref 135–145)
Total Bilirubin: 0.5 mg/dL (ref 0.3–1.2)
Total Protein: 7.3 g/dL (ref 6.5–8.1)

## 2019-04-28 LAB — CBC WITH DIFFERENTIAL/PLATELET
Abs Immature Granulocytes: 0.02 10*3/uL (ref 0.00–0.07)
Basophils Absolute: 0 10*3/uL (ref 0.0–0.1)
Basophils Relative: 1 %
Eosinophils Absolute: 0.2 10*3/uL (ref 0.0–0.5)
Eosinophils Relative: 4 %
HCT: 38.5 % (ref 36.0–46.0)
Hemoglobin: 12.3 g/dL (ref 12.0–15.0)
Immature Granulocytes: 0 %
Lymphocytes Relative: 31 %
Lymphs Abs: 1.4 10*3/uL (ref 0.7–4.0)
MCH: 27.8 pg (ref 26.0–34.0)
MCHC: 31.9 g/dL (ref 30.0–36.0)
MCV: 87.1 fL (ref 80.0–100.0)
Monocytes Absolute: 0.5 10*3/uL (ref 0.1–1.0)
Monocytes Relative: 10 %
Neutro Abs: 2.5 10*3/uL (ref 1.7–7.7)
Neutrophils Relative %: 54 %
Platelets: 250 10*3/uL (ref 150–400)
RBC: 4.42 MIL/uL (ref 3.87–5.11)
RDW: 14.3 % (ref 11.5–15.5)
WBC: 4.6 10*3/uL (ref 4.0–10.5)
nRBC: 0 % (ref 0.0–0.2)

## 2019-04-28 MED ORDER — SODIUM CHLORIDE 0.9 % IV SOLN
Freq: Once | INTRAVENOUS | Status: AC
  Filled 2019-04-28: qty 250

## 2019-04-28 MED ORDER — SODIUM CHLORIDE 0.9% FLUSH
10.0000 mL | INTRAVENOUS | Status: DC | PRN
  Administered 2019-04-28: 10 mL
  Filled 2019-04-28: qty 10

## 2019-04-28 MED ORDER — ACETAMINOPHEN 325 MG PO TABS
650.0000 mg | ORAL_TABLET | Freq: Once | ORAL | Status: AC
  Administered 2019-04-28: 650 mg via ORAL

## 2019-04-28 MED ORDER — HEPARIN SOD (PORK) LOCK FLUSH 100 UNIT/ML IV SOLN
500.0000 [IU] | Freq: Once | INTRAVENOUS | Status: AC | PRN
  Administered 2019-04-28: 14:00:00 500 [IU]
  Filled 2019-04-28: qty 5

## 2019-04-28 MED ORDER — ACETAMINOPHEN 325 MG PO TABS
ORAL_TABLET | ORAL | Status: AC
  Filled 2019-04-28: qty 2

## 2019-04-28 MED ORDER — TRASTUZUMAB-DKST CHEMO 150 MG IV SOLR
450.0000 mg | Freq: Once | INTRAVENOUS | Status: AC
  Administered 2019-04-28: 450 mg via INTRAVENOUS
  Filled 2019-04-28: qty 21.43

## 2019-04-28 NOTE — Patient Instructions (Signed)
[facility] °Discharge Instructions for Patients Receiving Chemotherapy ° °Today you received the following chemotherapy agents Trastuzumab ° °To help prevent nausea and vomiting after your treatment, we encourage you to take your nausea medication as directed. °  °If you develop nausea and vomiting that is not controlled by your nausea medication, call the clinic.  ° °BELOW ARE SYMPTOMS THAT SHOULD BE REPORTED IMMEDIATELY: °· *FEVER GREATER THAN 100.5 F °· *CHILLS WITH OR WITHOUT FEVER °· NAUSEA AND VOMITING THAT IS NOT CONTROLLED WITH YOUR NAUSEA MEDICATION °· *UNUSUAL SHORTNESS OF BREATH °· *UNUSUAL BRUISING OR BLEEDING °· TENDERNESS IN MOUTH AND THROAT WITH OR WITHOUT PRESENCE OF ULCERS °· *URINARY PROBLEMS °· *BOWEL PROBLEMS °· UNUSUAL RASH °Items with * indicate a potential emergency and should be followed up as soon as possible. ° °Feel free to call the clinic should you have any questions or concerns. The clinic phone number is [phone]. ° °Please show the CHEMO ALERT CARD at check-in to the Emergency Department and triage nurse. ° ° °

## 2019-04-29 ENCOUNTER — Telehealth: Payer: Self-pay | Admitting: Oncology

## 2019-04-29 NOTE — Telephone Encounter (Signed)
Scheduled appts per 4/7 los. Left voicemail with new appt info.

## 2019-05-03 DIAGNOSIS — I10 Essential (primary) hypertension: Secondary | ICD-10-CM | POA: Diagnosis not present

## 2019-05-03 DIAGNOSIS — H1013 Acute atopic conjunctivitis, bilateral: Secondary | ICD-10-CM | POA: Diagnosis not present

## 2019-05-03 DIAGNOSIS — J309 Allergic rhinitis, unspecified: Secondary | ICD-10-CM | POA: Diagnosis not present

## 2019-05-10 ENCOUNTER — Other Ambulatory Visit: Payer: Self-pay

## 2019-05-10 ENCOUNTER — Ambulatory Visit (HOSPITAL_COMMUNITY): Payer: BC Managed Care – PPO | Attending: Cardiovascular Disease

## 2019-05-10 DIAGNOSIS — C50412 Malignant neoplasm of upper-outer quadrant of left female breast: Secondary | ICD-10-CM | POA: Diagnosis not present

## 2019-05-10 DIAGNOSIS — Z171 Estrogen receptor negative status [ER-]: Secondary | ICD-10-CM | POA: Diagnosis not present

## 2019-05-13 NOTE — Progress Notes (Signed)
Pharmacist Chemotherapy Monitoring - Follow Up Assessment    I verify that I have reviewed each item in the below checklist:  . Regimen for the patient is scheduled for the appropriate day and plan matches scheduled date. Marland Kitchen Appropriate non-routine labs are ordered dependent on drug ordered. . If applicable, additional medications reviewed and ordered per protocol based on lifetime cumulative doses and/or treatment regimen.   Plan for follow-up and/or issues identified: No . I-vent associated with next due treatment: No . MD and/or nursing notified: No  Dorothy Frederick 05/13/2019 2:26 PM

## 2019-05-18 ENCOUNTER — Other Ambulatory Visit: Payer: Self-pay

## 2019-05-18 ENCOUNTER — Encounter: Payer: Self-pay | Admitting: *Deleted

## 2019-05-18 ENCOUNTER — Ambulatory Visit (INDEPENDENT_AMBULATORY_CARE_PROVIDER_SITE_OTHER): Payer: BC Managed Care – PPO | Admitting: Surgical

## 2019-05-18 ENCOUNTER — Encounter: Payer: Self-pay | Admitting: Surgical

## 2019-05-18 VITALS — BP 119/73 | HR 74 | Temp 97.3°F | Ht 63.0 in | Wt 159.0 lb

## 2019-05-18 DIAGNOSIS — C50412 Malignant neoplasm of upper-outer quadrant of left female breast: Secondary | ICD-10-CM | POA: Diagnosis not present

## 2019-05-18 DIAGNOSIS — Z171 Estrogen receptor negative status [ER-]: Secondary | ICD-10-CM

## 2019-05-18 DIAGNOSIS — Z853 Personal history of malignant neoplasm of breast: Secondary | ICD-10-CM | POA: Diagnosis not present

## 2019-05-18 NOTE — Progress Notes (Signed)
Patient is a 64 year old female here for follow-up of her left breast wound excision and closure and placement of Prevena wound VAC on 04/01/2019 with Dr. Marla Roe.  She has a history of radiation to her left chest in 2000.  She is currently undergoing chemotherapy until September 2021.  She reports that she is doing well today.  She has been tolerating this round of chemotherapy a lot better with the change of medication.  She does not have any complaints today.  No fevers or chills.  She has been able to go on walks outside and is up to a quarter of a mile and is very happy about this, I am glad that she is able to enjoy the nice weather and be more active.  ROS: As above.  Chaperone present General: NAD, Non toxic, well appearing female. Head: Normocephalic, atraumatic Resp: Unlabored Breast: left chest wall incision is healed very nicely.  There is no erythema.  There is no drainage noted.  There is no open wounds.  I was able to palpate a little fluid collection anterolaterally.  There is no induration or fluctuance noted.    Plan: I was able to aspirate about 35 cc of serous straw-colored fluid.  She tolerated this procedure fine.  Sterile technique was used.  After aspiration I was unable to notice any other fluid collections.  Follow-up in 6 months for reevaluation.  Patient will finish chemotherapy just prior to that visit.  I recommend she call us with any further questions or concerns.  If she needs to be seen sooner for additional aspiration or if she has any other questions or concerns I will be more than happy to see her sooner.  Pictures were obtained of the patient and placed in the chart with the patient's or guardian's permission.

## 2019-05-18 NOTE — Addendum Note (Signed)
Addended byRoetta Sessions on: 05/18/2019 10:48 AM   Modules accepted: Level of Service

## 2019-05-19 ENCOUNTER — Inpatient Hospital Stay: Payer: BC Managed Care – PPO

## 2019-05-19 ENCOUNTER — Other Ambulatory Visit: Payer: Self-pay

## 2019-05-19 ENCOUNTER — Other Ambulatory Visit: Payer: Self-pay | Admitting: Oncology

## 2019-05-19 VITALS — BP 125/64 | HR 68 | Temp 98.9°F | Resp 16

## 2019-05-19 DIAGNOSIS — Z79899 Other long term (current) drug therapy: Secondary | ICD-10-CM | POA: Diagnosis not present

## 2019-05-19 DIAGNOSIS — C50412 Malignant neoplasm of upper-outer quadrant of left female breast: Secondary | ICD-10-CM | POA: Diagnosis not present

## 2019-05-19 DIAGNOSIS — Z171 Estrogen receptor negative status [ER-]: Secondary | ICD-10-CM

## 2019-05-19 DIAGNOSIS — F17210 Nicotine dependence, cigarettes, uncomplicated: Secondary | ICD-10-CM | POA: Diagnosis not present

## 2019-05-19 DIAGNOSIS — G629 Polyneuropathy, unspecified: Secondary | ICD-10-CM | POA: Diagnosis not present

## 2019-05-19 DIAGNOSIS — I158 Other secondary hypertension: Secondary | ICD-10-CM

## 2019-05-19 DIAGNOSIS — N184 Chronic kidney disease, stage 4 (severe): Secondary | ICD-10-CM | POA: Diagnosis not present

## 2019-05-19 DIAGNOSIS — Z95828 Presence of other vascular implants and grafts: Secondary | ICD-10-CM

## 2019-05-19 DIAGNOSIS — E876 Hypokalemia: Secondary | ICD-10-CM | POA: Diagnosis not present

## 2019-05-19 DIAGNOSIS — Z9012 Acquired absence of left breast and nipple: Secondary | ICD-10-CM | POA: Diagnosis not present

## 2019-05-19 DIAGNOSIS — I129 Hypertensive chronic kidney disease with stage 1 through stage 4 chronic kidney disease, or unspecified chronic kidney disease: Secondary | ICD-10-CM | POA: Diagnosis not present

## 2019-05-19 DIAGNOSIS — Z5111 Encounter for antineoplastic chemotherapy: Secondary | ICD-10-CM | POA: Diagnosis not present

## 2019-05-19 LAB — CBC WITH DIFFERENTIAL/PLATELET
Abs Immature Granulocytes: 0.01 10*3/uL (ref 0.00–0.07)
Basophils Absolute: 0 10*3/uL (ref 0.0–0.1)
Basophils Relative: 1 %
Eosinophils Absolute: 0.1 10*3/uL (ref 0.0–0.5)
Eosinophils Relative: 3 %
HCT: 37.8 % (ref 36.0–46.0)
Hemoglobin: 12.3 g/dL (ref 12.0–15.0)
Immature Granulocytes: 0 %
Lymphocytes Relative: 28 %
Lymphs Abs: 1.3 10*3/uL (ref 0.7–4.0)
MCH: 28.3 pg (ref 26.0–34.0)
MCHC: 32.5 g/dL (ref 30.0–36.0)
MCV: 86.9 fL (ref 80.0–100.0)
Monocytes Absolute: 0.4 10*3/uL (ref 0.1–1.0)
Monocytes Relative: 9 %
Neutro Abs: 2.7 10*3/uL (ref 1.7–7.7)
Neutrophils Relative %: 59 %
Platelets: 261 10*3/uL (ref 150–400)
RBC: 4.35 MIL/uL (ref 3.87–5.11)
RDW: 14.2 % (ref 11.5–15.5)
WBC: 4.7 10*3/uL (ref 4.0–10.5)
nRBC: 0 % (ref 0.0–0.2)

## 2019-05-19 LAB — COMPREHENSIVE METABOLIC PANEL
ALT: 23 U/L (ref 0–44)
AST: 19 U/L (ref 15–41)
Albumin: 3.6 g/dL (ref 3.5–5.0)
Alkaline Phosphatase: 101 U/L (ref 38–126)
Anion gap: 8 (ref 5–15)
BUN: 17 mg/dL (ref 8–23)
CO2: 24 mmol/L (ref 22–32)
Calcium: 8.8 mg/dL — ABNORMAL LOW (ref 8.9–10.3)
Chloride: 109 mmol/L (ref 98–111)
Creatinine, Ser: 0.95 mg/dL (ref 0.44–1.00)
GFR calc Af Amer: >60 mL/min (ref >60)
GFR calc non Af Amer: >60 mL/min (ref >60)
Glucose, Bld: 104 mg/dL — ABNORMAL HIGH (ref 70–99)
Potassium: 4 mmol/L (ref 3.5–5.1)
Sodium: 141 mmol/L (ref 135–145)
Total Bilirubin: 0.5 mg/dL (ref 0.3–1.2)
Total Protein: 7.4 g/dL (ref 6.5–8.1)

## 2019-05-19 MED ORDER — SODIUM CHLORIDE 0.9 % IV SOLN
Freq: Once | INTRAVENOUS | Status: AC
  Filled 2019-05-19: qty 250

## 2019-05-19 MED ORDER — ACETAMINOPHEN 325 MG PO TABS
650.0000 mg | ORAL_TABLET | Freq: Once | ORAL | Status: AC
  Administered 2019-05-19: 650 mg via ORAL

## 2019-05-19 MED ORDER — HEPARIN SOD (PORK) LOCK FLUSH 100 UNIT/ML IV SOLN
500.0000 [IU] | Freq: Once | INTRAVENOUS | Status: AC | PRN
  Administered 2019-05-19: 500 [IU]
  Filled 2019-05-19: qty 5

## 2019-05-19 MED ORDER — TRASTUZUMAB-DKST CHEMO 150 MG IV SOLR
450.0000 mg | Freq: Once | INTRAVENOUS | Status: AC
  Administered 2019-05-19: 15:00:00 450 mg via INTRAVENOUS
  Filled 2019-05-19: qty 21.43

## 2019-05-19 MED ORDER — SODIUM CHLORIDE 0.9% FLUSH
10.0000 mL | INTRAVENOUS | Status: DC | PRN
  Filled 2019-05-19: qty 10

## 2019-05-19 MED ORDER — SODIUM CHLORIDE 0.9% FLUSH
10.0000 mL | INTRAVENOUS | Status: DC | PRN
  Administered 2019-05-19 (×2): 10 mL
  Filled 2019-05-19: qty 10

## 2019-05-19 MED ORDER — ACETAMINOPHEN 325 MG PO TABS
ORAL_TABLET | ORAL | Status: AC
  Filled 2019-05-19: qty 2

## 2019-05-19 MED ORDER — HEPARIN SOD (PORK) LOCK FLUSH 100 UNIT/ML IV SOLN
500.0000 [IU] | Freq: Once | INTRAVENOUS | Status: DC | PRN
  Filled 2019-05-19: qty 5

## 2019-05-19 NOTE — Patient Instructions (Signed)
[facility] Discharge Instructions for Patients Receiving Chemotherapy  Today you received the following chemotherapy agents trastuzumab.  To help prevent nausea and vomiting after your treatment, we encourage you to take your nausea medication as directed.    If you develop nausea and vomiting that is not controlled by your nausea medication, call the clinic.   BELOW ARE SYMPTOMS THAT SHOULD BE REPORTED IMMEDIATELY:  *FEVER GREATER THAN 100.5 F  *CHILLS WITH OR WITHOUT FEVER  NAUSEA AND VOMITING THAT IS NOT CONTROLLED WITH YOUR NAUSEA MEDICATION  *UNUSUAL SHORTNESS OF BREATH  *UNUSUAL BRUISING OR BLEEDING  TENDERNESS IN MOUTH AND THROAT WITH OR WITHOUT PRESENCE OF ULCERS  *URINARY PROBLEMS  *BOWEL PROBLEMS  UNUSUAL RASH Items with * indicate a potential emergency and should be followed up as soon as possible.  Feel free to call the clinic should you have any questions or concerns. The clinic phone number is [phone].  Please show the CHEMO ALERT CARD at check-in to the Emergency Department and triage nurse.   

## 2019-06-04 DIAGNOSIS — E782 Mixed hyperlipidemia: Secondary | ICD-10-CM | POA: Diagnosis not present

## 2019-06-04 DIAGNOSIS — I10 Essential (primary) hypertension: Secondary | ICD-10-CM | POA: Diagnosis not present

## 2019-06-04 DIAGNOSIS — E039 Hypothyroidism, unspecified: Secondary | ICD-10-CM | POA: Diagnosis not present

## 2019-06-08 DIAGNOSIS — I10 Essential (primary) hypertension: Secondary | ICD-10-CM | POA: Diagnosis not present

## 2019-06-08 DIAGNOSIS — E039 Hypothyroidism, unspecified: Secondary | ICD-10-CM | POA: Diagnosis not present

## 2019-06-08 DIAGNOSIS — F411 Generalized anxiety disorder: Secondary | ICD-10-CM | POA: Diagnosis not present

## 2019-06-08 DIAGNOSIS — E782 Mixed hyperlipidemia: Secondary | ICD-10-CM | POA: Diagnosis not present

## 2019-06-09 ENCOUNTER — Inpatient Hospital Stay: Payer: BC Managed Care – PPO | Attending: Oncology

## 2019-06-09 ENCOUNTER — Inpatient Hospital Stay: Payer: BC Managed Care – PPO

## 2019-06-09 ENCOUNTER — Other Ambulatory Visit: Payer: Self-pay

## 2019-06-09 VITALS — BP 144/54 | HR 62 | Temp 98.0°F | Resp 18 | Wt 162.5 lb

## 2019-06-09 DIAGNOSIS — Z95828 Presence of other vascular implants and grafts: Secondary | ICD-10-CM

## 2019-06-09 DIAGNOSIS — Z5112 Encounter for antineoplastic immunotherapy: Secondary | ICD-10-CM | POA: Insufficient documentation

## 2019-06-09 DIAGNOSIS — I158 Other secondary hypertension: Secondary | ICD-10-CM

## 2019-06-09 DIAGNOSIS — Z171 Estrogen receptor negative status [ER-]: Secondary | ICD-10-CM | POA: Insufficient documentation

## 2019-06-09 DIAGNOSIS — C50412 Malignant neoplasm of upper-outer quadrant of left female breast: Secondary | ICD-10-CM | POA: Diagnosis not present

## 2019-06-09 DIAGNOSIS — Z5111 Encounter for antineoplastic chemotherapy: Secondary | ICD-10-CM | POA: Diagnosis not present

## 2019-06-09 LAB — CBC WITH DIFFERENTIAL/PLATELET
Abs Immature Granulocytes: 0.02 10*3/uL (ref 0.00–0.07)
Basophils Absolute: 0.1 10*3/uL (ref 0.0–0.1)
Basophils Relative: 1 %
Eosinophils Absolute: 0.2 10*3/uL (ref 0.0–0.5)
Eosinophils Relative: 3 %
HCT: 36.2 % (ref 36.0–46.0)
Hemoglobin: 11.6 g/dL — ABNORMAL LOW (ref 12.0–15.0)
Immature Granulocytes: 0 %
Lymphocytes Relative: 27 %
Lymphs Abs: 1.4 10*3/uL (ref 0.7–4.0)
MCH: 28 pg (ref 26.0–34.0)
MCHC: 32 g/dL (ref 30.0–36.0)
MCV: 87.4 fL (ref 80.0–100.0)
Monocytes Absolute: 0.5 10*3/uL (ref 0.1–1.0)
Monocytes Relative: 9 %
Neutro Abs: 3 10*3/uL (ref 1.7–7.7)
Neutrophils Relative %: 60 %
Platelets: 257 10*3/uL (ref 150–400)
RBC: 4.14 MIL/uL (ref 3.87–5.11)
RDW: 13.9 % (ref 11.5–15.5)
WBC: 5 10*3/uL (ref 4.0–10.5)
nRBC: 0 % (ref 0.0–0.2)

## 2019-06-09 LAB — COMPREHENSIVE METABOLIC PANEL
ALT: 22 U/L (ref 0–44)
AST: 18 U/L (ref 15–41)
Albumin: 3.5 g/dL (ref 3.5–5.0)
Alkaline Phosphatase: 112 U/L (ref 38–126)
Anion gap: 8 (ref 5–15)
BUN: 17 mg/dL (ref 8–23)
CO2: 24 mmol/L (ref 22–32)
Calcium: 8.8 mg/dL — ABNORMAL LOW (ref 8.9–10.3)
Chloride: 108 mmol/L (ref 98–111)
Creatinine, Ser: 0.95 mg/dL (ref 0.44–1.00)
GFR calc Af Amer: >60 mL/min (ref >60)
GFR calc non Af Amer: >60 mL/min (ref >60)
Glucose, Bld: 100 mg/dL — ABNORMAL HIGH (ref 70–99)
Potassium: 3.9 mmol/L (ref 3.5–5.1)
Sodium: 140 mmol/L (ref 135–145)
Total Bilirubin: 0.5 mg/dL (ref 0.3–1.2)
Total Protein: 7.1 g/dL (ref 6.5–8.1)

## 2019-06-09 MED ORDER — SODIUM CHLORIDE 0.9% FLUSH
10.0000 mL | INTRAVENOUS | Status: DC | PRN
  Administered 2019-06-09: 10 mL
  Filled 2019-06-09: qty 10

## 2019-06-09 MED ORDER — ACETAMINOPHEN 325 MG PO TABS
ORAL_TABLET | ORAL | Status: AC
  Filled 2019-06-09: qty 2

## 2019-06-09 MED ORDER — TRASTUZUMAB-DKST CHEMO 150 MG IV SOLR
450.0000 mg | Freq: Once | INTRAVENOUS | Status: AC
  Administered 2019-06-09: 450 mg via INTRAVENOUS
  Filled 2019-06-09: qty 21.43

## 2019-06-09 MED ORDER — ACETAMINOPHEN 325 MG PO TABS
650.0000 mg | ORAL_TABLET | Freq: Once | ORAL | Status: AC
  Administered 2019-06-09: 650 mg via ORAL

## 2019-06-09 MED ORDER — SODIUM CHLORIDE 0.9 % IV SOLN
Freq: Once | INTRAVENOUS | Status: AC
  Filled 2019-06-09: qty 250

## 2019-06-09 MED ORDER — HEPARIN SOD (PORK) LOCK FLUSH 100 UNIT/ML IV SOLN
500.0000 [IU] | Freq: Once | INTRAVENOUS | Status: AC | PRN
  Administered 2019-06-09: 500 [IU]
  Filled 2019-06-09: qty 5

## 2019-06-09 NOTE — Patient Instructions (Signed)
[facility] °Discharge Instructions for Patients Receiving Chemotherapy ° °Today you received the following chemotherapy agents Trastuzumab ° °To help prevent nausea and vomiting after your treatment, we encourage you to take your nausea medication as directed. °  °If you develop nausea and vomiting that is not controlled by your nausea medication, call the clinic.  ° °BELOW ARE SYMPTOMS THAT SHOULD BE REPORTED IMMEDIATELY: °· *FEVER GREATER THAN 100.5 F °· *CHILLS WITH OR WITHOUT FEVER °· NAUSEA AND VOMITING THAT IS NOT CONTROLLED WITH YOUR NAUSEA MEDICATION °· *UNUSUAL SHORTNESS OF BREATH °· *UNUSUAL BRUISING OR BLEEDING °· TENDERNESS IN MOUTH AND THROAT WITH OR WITHOUT PRESENCE OF ULCERS °· *URINARY PROBLEMS °· *BOWEL PROBLEMS °· UNUSUAL RASH °Items with * indicate a potential emergency and should be followed up as soon as possible. ° °Feel free to call the clinic should you have any questions or concerns. The clinic phone number is [phone]. ° °Please show the CHEMO ALERT CARD at check-in to the Emergency Department and triage nurse. ° ° °

## 2019-06-24 ENCOUNTER — Encounter: Payer: Self-pay | Admitting: *Deleted

## 2019-06-30 ENCOUNTER — Other Ambulatory Visit: Payer: Self-pay

## 2019-06-30 ENCOUNTER — Inpatient Hospital Stay: Payer: BC Managed Care – PPO | Attending: Oncology

## 2019-06-30 ENCOUNTER — Inpatient Hospital Stay: Payer: BC Managed Care – PPO

## 2019-06-30 ENCOUNTER — Telehealth: Payer: Self-pay | Admitting: Oncology

## 2019-06-30 ENCOUNTER — Inpatient Hospital Stay (HOSPITAL_BASED_OUTPATIENT_CLINIC_OR_DEPARTMENT_OTHER): Payer: BC Managed Care – PPO | Admitting: Oncology

## 2019-06-30 VITALS — BP 107/47 | HR 61 | Temp 98.7°F | Resp 18 | Wt 164.0 lb

## 2019-06-30 DIAGNOSIS — C50412 Malignant neoplasm of upper-outer quadrant of left female breast: Secondary | ICD-10-CM

## 2019-06-30 DIAGNOSIS — Z171 Estrogen receptor negative status [ER-]: Secondary | ICD-10-CM | POA: Insufficient documentation

## 2019-06-30 DIAGNOSIS — Z95828 Presence of other vascular implants and grafts: Secondary | ICD-10-CM

## 2019-06-30 DIAGNOSIS — I158 Other secondary hypertension: Secondary | ICD-10-CM

## 2019-06-30 DIAGNOSIS — Z5111 Encounter for antineoplastic chemotherapy: Secondary | ICD-10-CM | POA: Diagnosis not present

## 2019-06-30 LAB — COMPREHENSIVE METABOLIC PANEL
ALT: 22 U/L (ref 0–44)
AST: 18 U/L (ref 15–41)
Albumin: 3.6 g/dL (ref 3.5–5.0)
Alkaline Phosphatase: 121 U/L (ref 38–126)
Anion gap: 12 (ref 5–15)
BUN: 16 mg/dL (ref 8–23)
CO2: 23 mmol/L (ref 22–32)
Calcium: 9.4 mg/dL (ref 8.9–10.3)
Chloride: 107 mmol/L (ref 98–111)
Creatinine, Ser: 0.93 mg/dL (ref 0.44–1.00)
GFR calc Af Amer: >60 mL/min (ref >60)
GFR calc non Af Amer: >60 mL/min (ref >60)
Glucose, Bld: 93 mg/dL (ref 70–99)
Potassium: 4.2 mmol/L (ref 3.5–5.1)
Sodium: 142 mmol/L (ref 135–145)
Total Bilirubin: 0.6 mg/dL (ref 0.3–1.2)
Total Protein: 7.4 g/dL (ref 6.5–8.1)

## 2019-06-30 LAB — CBC WITH DIFFERENTIAL/PLATELET
Abs Immature Granulocytes: 0.02 10*3/uL (ref 0.00–0.07)
Basophils Absolute: 0.1 10*3/uL (ref 0.0–0.1)
Basophils Relative: 1 %
Eosinophils Absolute: 0.2 10*3/uL (ref 0.0–0.5)
Eosinophils Relative: 3 %
HCT: 35.8 % — ABNORMAL LOW (ref 36.0–46.0)
Hemoglobin: 11.7 g/dL — ABNORMAL LOW (ref 12.0–15.0)
Immature Granulocytes: 0 %
Lymphocytes Relative: 30 %
Lymphs Abs: 1.5 10*3/uL (ref 0.7–4.0)
MCH: 29 pg (ref 26.0–34.0)
MCHC: 32.7 g/dL (ref 30.0–36.0)
MCV: 88.6 fL (ref 80.0–100.0)
Monocytes Absolute: 0.6 10*3/uL (ref 0.1–1.0)
Monocytes Relative: 11 %
Neutro Abs: 2.9 10*3/uL (ref 1.7–7.7)
Neutrophils Relative %: 55 %
Platelets: 276 10*3/uL (ref 150–400)
RBC: 4.04 MIL/uL (ref 3.87–5.11)
RDW: 13.8 % (ref 11.5–15.5)
WBC: 5.2 10*3/uL (ref 4.0–10.5)
nRBC: 0 % (ref 0.0–0.2)

## 2019-06-30 MED ORDER — SODIUM CHLORIDE 0.9% FLUSH
10.0000 mL | INTRAVENOUS | Status: DC | PRN
  Administered 2019-06-30: 10 mL
  Filled 2019-06-30: qty 10

## 2019-06-30 MED ORDER — ACETAMINOPHEN 325 MG PO TABS
650.0000 mg | ORAL_TABLET | Freq: Once | ORAL | Status: AC
  Administered 2019-06-30: 650 mg via ORAL

## 2019-06-30 MED ORDER — SODIUM CHLORIDE 0.9 % IV SOLN
Freq: Once | INTRAVENOUS | Status: AC
  Filled 2019-06-30: qty 250

## 2019-06-30 MED ORDER — ACETAMINOPHEN 325 MG PO TABS
ORAL_TABLET | ORAL | Status: AC
  Filled 2019-06-30: qty 2

## 2019-06-30 MED ORDER — TRASTUZUMAB-DKST CHEMO 150 MG IV SOLR
450.0000 mg | Freq: Once | INTRAVENOUS | Status: AC
  Administered 2019-06-30: 450 mg via INTRAVENOUS
  Filled 2019-06-30: qty 21.43

## 2019-06-30 MED ORDER — HEPARIN SOD (PORK) LOCK FLUSH 100 UNIT/ML IV SOLN
500.0000 [IU] | Freq: Once | INTRAVENOUS | Status: AC | PRN
  Administered 2019-06-30: 500 [IU]
  Filled 2019-06-30: qty 5

## 2019-06-30 NOTE — Progress Notes (Signed)
[facility]  [phone] [phone]    ID: [facility] DOB: [birth year]  MR#: [id number]  NOM#:[id number]  Patient Care Team: [facility] Bien, MD as PCP - General (Family Medicine) [facility] Latch, MD as PCP - Cardiology (Cardiology) [facility] Kaufmann, RN as Oncology Nurse Navigator [facility] Germany, RN as Oncology Nurse Navigator [facility] Skates, MD as Consulting Physician (General Surgery) Magrinat, Virgie Dad, MD as Consulting Physician (Oncology) [facility] Pray, MD as Consulting Physician (Radiation Oncology) Croitoru, Dani Gobble, MD as Consulting Physician (Cardiology) OTHER MD:   CHIEF COMPLAINT: Estrogen receptor negative breast cancer  CURRENT TREATMENT: Trastuzumab   INTERVAL HISTORY: [facility] returns today for follow-up and treatment of her estrogen receptor negative breast cancer.   She continues on trastuzumab. She underwent repeat echocardiogram on 05/10/2019 showing an ejection fraction of 55-60%.   REVIEW OF SYSTEMS: [facility] is doing generally quite well.  She still a bit short of breath because of her COPD.  She takes 1/4 to 1/2 mile walks most mornings.  She still drives to [facility] [place], which is 1 hour one way.  She has received her first dose of the Moderna vaccine.  Aside from these issues detailed review of systems today was stable.   HISTORY OF CURRENT ILLNESS: From the original intake note:  [facility] has a history of left breast cancer diagnosed in 2000, and treated with a partial mastectomy.  She has been followed by Dr. [facility] in [facility]. She was also treated with Tamoxifen for 1.5 years, which she tolerated well, but had to discontinue due to financial difficulties. She did not receive chemotherapy. She notes that her cancer was lymph node negative.  More recently, she had routine screening mammography on 07/06/2018 showing: Breast Density Category B. On mammography, there is a 1.5 cm oval mass with  associated calcifications in the left breast upper outer quadrant posterior depth 6.6 cm from the nipple. This mass is approximately 3 cm anterior to the lumpectomy site in the upper outer posterior left breast. No other significant masses, calcifications, or other findings are seen in either breast.   Accordingly on 07/13/2018 she proceeded to biopsy of the left breast area in question. The pathology from this procedure showed ([id number]): invasive ductal carcinoma, grade III, 2 o'clock, 5 cm from the nipple. Prognostic indicators significant for: estrogen receptor, 0% negative and progesterone receptor, 0% negative. Proliferation marker Ki67 at 70%. HER2 positive (3+) by immunohistochemistry.  The patient's subsequent history is as detailed below.   PAST MEDICAL HISTORY: Past Medical History:  Diagnosis Date  . Angina at rest [facility])   . Anxiety   . Cancer [facility]) 2000   left breast cancer-partial mastectomy  . COPD (chronic obstructive pulmonary disease) ([place])   . Depression   . Family history of breast cancer   . Family history of esophageal cancer   . Family history of lung cancer   . Family history of prostate cancer   . GERD (gastroesophageal reflux disease)   . Hyperlipidemia   . Hypertension   . Hypothyroidism   . Recurrent breast cancer, left ([place])     PAST SURGICAL HISTORY: Past Surgical History:  Procedure Laterality Date  . ADENOIDECTOMY    . APPLICATION OF WOUND VAC Left 04/01/2019   Procedure: Placement of provena wound vac;  Surgeon: [facility] Going, DO;  Location: [place];  Service: Plastics;  Laterality: Left;  . DEBRIDEMENT AND CLOSURE WOUND Left 04/01/2019   Procedure: Left breast wound excision  and closure;  Surgeon: [facility] Going, DO;  Location: [facility];  Service: Plastics;  Laterality: Left;  . LEFT HEART CATHETERIZATION WITH CORONARY ANGIOGRAM N/A 10/21/2013   Procedure: LEFT HEART CATHETERIZATION WITH CORONARY  ANGIOGRAM;  Surgeon: Burnell Blanks, MD;  Location: [facility];  Service: Cardiovascular;  Laterality: N/A;  . MASTECTOMY W/ SENTINEL NODE BIOPSY Left 09/16/2018   Procedure: LEFT TOTAL MASTECTOMY WITH LEFT AXILLARY DEEP SENTINEL LYMPH NODE BIOPSY AND INJECT BLUE DYE;  Surgeon: [facility] Skates, MD;  Location: [facility];  Service: General;  Laterality: Left;  Marland Kitchen MASTECTOMY, PARTIAL Left 2000  . PORTACATH PLACEMENT Right 09/16/2018   Procedure: INSERTION PORT-A-CATH;  Surgeon: [facility] Skates, MD;  Location: [facility];  Service: General;  Laterality: Right;  . TONSILLECTOMY      FAMILY HISTORY: Family History  Problem Relation Age of Onset  . Heart disease Mother   . Hypertension Father   . Cancer Father   . Prostate cancer Father   . Hypertension Sister   . Breast cancer Sister        dx 78s  . Heart disease Sister   . Hypertension Brother   . Hyperlipidemia Maternal Grandmother   . Stroke Maternal Grandmother   . Hypertension Brother   . Breast cancer Maternal Aunt   . Lung cancer Maternal Aunt   . Esophageal cancer Maternal Aunt   . Stomach cancer Paternal Grandmother    [facility]'s father died from prostate cancer at age 74. Patients' mother died from heart complications at age 57. The patient has 2 half brothers and 2 half sisters. Patient denies anyone in her family having ovarian, prostate, or pancreatic cancer. 1 maternal aunt and 20 half aunt (half-sister of the patient's mother) had breast cancer breast cancer.    GYNECOLOGIC HISTORY:  No LMP recorded. Patient is postmenopausal. Menarche: 64 years old Age at first live birth: 64 years old [place] P: 2 LMP: 2008 Contraceptive: 6-7 years, with no complications HRT: no  Hysterectomy?: no BSO?: no   SOCIAL HISTORY: (Current as of 07/22/2018) [facility] works in accounts payable with [facility]. She is divorced. She lives with her daughter and two grandchildren. Her daughter, [facility], is 80 and  works for [facility] as a Clinical research associate.  Dorothy Frederick's children Dorothy [facility], 13, and [facility], 10 also live with the patient. [facility]'s other daughter, Sharyn Lull, is a Licensed conveyancer at [employer]. Kacee has 4 grandchildren in total.   ADVANCED DIRECTIVES:  Not in place. [facility] was given the appropriate forms on 07/22/18 to fill out and return at their own discretion.     HEALTH MAINTENANCE: Social History   Tobacco Use  . Smoking status: Former Smoker    Packs/day: 1.00    Years: 38.00    Pack years: 38.00    Types: Cigarettes  . Smokeless tobacco: Never Used  Substance Use Topics  . Alcohol use: No  . Drug use: No    Colonoscopy: 2017  PAP:    Bone density: yes, [facility]; osteoporosis Mammography: up to date  Allergies  Allergen Reactions  . Benadryl [Diphenhydramine Hcl (Sleep)] Palpitations    Increased heartrate  . Sulfa Antibiotics Rash    Current Outpatient Medications  Medication Sig Dispense Refill  . albuterol (PROVENTIL HFA;VENTOLIN HFA) 108 (90 BASE) MCG/ACT inhaler Inhale 2 puffs into the lungs every 6 (six) hours as needed for wheezing or shortness of breath.    Marland Kitchen atorvastatin (LIPITOR) 20 MG tablet Take 20 mg by mouth at  bedtime.    . Cholecalciferol 1000 UNITS capsule Take 500 Units by mouth daily.     Marland Kitchen escitalopram (LEXAPRO) 10 MG tablet Take 10 mg by mouth daily.  3  . Fluticasone-Umeclidin-Vilant (TRELEGY ELLIPTA) 100-62.5-25 MCG/INH AEPB Inhale 1 puff into the lungs daily as needed.    Marland Kitchen levothyroxine (SYNTHROID, LEVOTHROID) 50 MCG tablet Take 1 tablet by mouth every other day. Alternate 50 mcg and 75 mcg every other day    . LORazepam (ATIVAN) 0.5 MG tablet Take 1 tablet (0.5 mg total) by mouth at bedtime as needed (Nausea or vomiting). 30 tablet 0   No current facility-administered medications for this visit.   Facility-Administered Medications Ordered in Other Visits  Medication Dose Route Frequency Provider Last Rate Last Admin  . 0.9 %  sodium chloride infusion   Intravenous  Continuous Gardenia Phlegm, NP   Stopped at 12/24/18 1548     OBJECTIVE: white woman in no acute distress  Vitals:   06/30/19 1355  BP: (!) 107/47  Pulse: 61  Resp: 18  Temp: 98.7 F (37.1 C)  SpO2: 97%   Wt Readings from Last 3 Encounters:  06/30/19 164 lb (74.4 kg)  06/09/19 162 lb 8 oz (73.7 kg)  05/18/19 159 lb (72.1 kg)   Body mass index is 29.05 kg/m.    ECOG FS:1 - Symptomatic but completely ambulatory  Sclerae unicteric, EOMs intact Wearing a mask No cervical or supraclavicular adenopathy Lungs no rales or rhonchi Heart regular rate and rhythm Abd soft, nontender, positive bowel sounds MSK no focal spinal tenderness, no upper extremity lymphedema Neuro: nonfocal, well oriented, appropriate affect Breasts: She is status post left mastectomy.  She is due for right mammography later this month at [facility].   LAB RESULTS:  CMP     Component Value Date/Time   NA 140 06/09/2019 1405   K 3.9 06/09/2019 1405   CL 108 06/09/2019 1405   CO2 24 06/09/2019 1405   GLUCOSE 100 (H) 06/09/2019 1405   BUN 17 06/09/2019 1405   CREATININE 0.95 06/09/2019 1405   CREATININE 1.11 (H) 07/22/2018 0835   CREATININE 0.97 10/18/2013 1527   CALCIUM 8.8 (L) 06/09/2019 1405   PROT 7.1 06/09/2019 1405   ALBUMIN 3.5 06/09/2019 1405   AST 18 06/09/2019 1405   AST 17 07/22/2018 0835   ALT 22 06/09/2019 1405   ALT 20 07/22/2018 0835   ALKPHOS 112 06/09/2019 1405   BILITOT 0.5 06/09/2019 1405   BILITOT 0.6 07/22/2018 0835   GFRNONAA >60 06/09/2019 1405   GFRNONAA 53 (L) 07/22/2018 0835   GFRAA >60 06/09/2019 1405   GFRAA >60 07/22/2018 0835    No results found for: TOTALPROTELP, ALBUMINELP, A1GS, A2GS, BETS, BETA2SER, GAMS, MSPIKE, SPEI  No results found for: KPAFRELGTCHN, LAMBDASER, KAPLAMBRATIO  Lab Results  Component Value Date   WBC 5.2 06/30/2019   NEUTROABS 2.9 06/30/2019   HGB 11.7 (L) 06/30/2019   HCT 35.8 (L) 06/30/2019   MCV 88.6 06/30/2019   PLT 276  06/30/2019    Lab Results  Component Value Date   LABCA2 24 09/08/2006    No components found for: DZHGDJ242  No results for input(s): INR in the last 168 hours.  Lab Results  Component Value Date   LABCA2 24 09/08/2006    No results found for: AST419  No results found for: QQI297  No results found for: LGX211  No results found for: CA2729  No components found for: HGQUANT  No results found for:  CEA1 / No results found for: CEA1   No results found for: AFPTUMOR  No results found for: CHROMOGRNA  No results found for: HGBA, HGBA2QUANT, HGBFQUANT, HGBSQUAN (Hemoglobinopathy evaluation)   No results found for: LDH  No results found for: IRON, TIBC, IRONPCTSAT (Iron and TIBC)  No results found for: FERRITIN   Urinalysis No results found for: COLORURINE, APPEARANCEUR, LABSPEC, PHURINE, GLUCOSEU, HGBUR, BILIRUBINUR, KETONESUR, PROTEINUR, UROBILINOGEN, NITRITE, LEUKOCYTESUR   STUDIES:  No results found.   ELIGIBLE FOR AVAILABLE RESEARCH PROTOCOL: no   ASSESSMENT: 64 y.o. [place], [place] woman status post left breast upper outer quadrant biopsy 07/13/2018 for a clinical T1c N0, stage IA invasive ductal carcinoma, grade 3, estrogen and progesterone receptor negative, HER-2 amplified, with an MIB-1 of 70%  (1) status post left mastectomy with sentinel lymph node sampling 09/16/2018 for a pT2, pN0, stage IIA invasive ductal carcinoma, grade 3, with negative margins  (a) a total of 6 sentinel lymph nodes were removed  (b) the patient opted against reconstruction  (c) status post wound debridement 04/01/2019 with benign pathology  (2) adjuvant chemotherapy with paclitaxel and trastuzumab weekly x12 started on 10/21/2018  (a) paclitaxel discontinued after 8 cycles due to peripheral neuropathy.  (3) trastuzumab to be continued every 21 days to total 1 year (through September 2021)  (a) echo 07/27/2018 shows an ejection fraction in the 55-60% range  (b) echo  01/11/2019 shows an ejection fraction in the 55-60% range  (c) echo 05/10/2019 shows an ejection fraction in the 55-60% range  (4) no indication for postmastectomy radiation  (5) genetic testing on 07/22/2018 through the [facility] Common Hereditary cancer panel found no pathogenic mutations.in APC, ATM, AXIN2, BARD1, BMPR1A, BRCA1, BRCA2, BRIP1, CDH1, CDKN2A (p14ARF), CDKN2A (p16INK4a), CKD4, CHEK2, CTNNA1, DICER1, EPCAM (Deletion/duplication testing only), GREM1 (promoter region deletion/duplication testing only), KIT, MEN1, MLH1, MSH2, MSH3, MSH6, MUTYH, NBN, NF1, NHTL1, PALB2, PDGFRA, PMS2, POLD1, POLE, PTEN, RAD50, RAD51C, RAD51D, SDHB, SDHC, SDHD, SMAD4, SMARCA4. STK11, TP53, TSC1, TSC2, and VHL.  The following genes were evaluated for sequence changes only: SDHA and HOXB13 c.251G>A variant only.   PLAN: [facility] is close to a year out from definitive surgery for her breast cancer with no evidence of disease recurrence.  This is very favorable.  She is tolerating trastuzumab well.  She will receive a treatment today and then every 21 days on till September which will be her last dose.  I will see her on that day and from that point we will broaden her follow-up interval  She is due for mammography later this month and also an echo in July and I have entered those orders  She knows to call for any other issue that may develop before her return here.  Virgie Frederick. Magrinat, MD  06/30/19 2:10 PM Medical Oncology and Hematology [facility] [place],  [address] Tel. [phone]    Fax. [phone]   I, Wilburn Mylar, am acting as scribe for Dr. Virgie [facility]. Magrinat.  I, Lurline Del MD, have reviewed the above documentation for accuracy and completeness, and I agree with the above.   *Total Encounter Time as defined by the Centers for Medicare and Medicaid Services includes, in addition to the face-to-face time of a patient visit (documented in the note  above) non-face-to-face time: obtaining and reviewing outside history, ordering and reviewing medications, tests or procedures, care coordination (communications with other health care professionals or caregivers) and documentation in the medical record.

## 2019-06-30 NOTE — Telephone Encounter (Signed)
Scheduled appts per 6/9 los. Pt declined print out of AVS and stated she would refer to mychart.

## 2019-06-30 NOTE — Patient Instructions (Signed)
[facility] Discharge Instructions for Patients Receiving Chemotherapy  Today you received the following chemotherapy agents: Ogivri   To help prevent nausea and vomiting after your treatment, we encourage you to take your nausea medication as directed.    If you develop nausea and vomiting that is not controlled by your nausea medication, call the clinic.   BELOW ARE SYMPTOMS THAT SHOULD BE REPORTED IMMEDIATELY:  *FEVER GREATER THAN 100.5 F  *CHILLS WITH OR WITHOUT FEVER  NAUSEA AND VOMITING THAT IS NOT CONTROLLED WITH YOUR NAUSEA MEDICATION  *UNUSUAL SHORTNESS OF BREATH  *UNUSUAL BRUISING OR BLEEDING  TENDERNESS IN MOUTH AND THROAT WITH OR WITHOUT PRESENCE OF ULCERS  *URINARY PROBLEMS  *BOWEL PROBLEMS  UNUSUAL RASH Items with * indicate a potential emergency and should be followed up as soon as possible.  Feel free to call the clinic should you have any questions or concerns. The clinic phone number is [phone].  Please show the CHEMO ALERT CARD at check-in to the Emergency Department and triage nurse.   

## 2019-07-08 DIAGNOSIS — H25043 Posterior subcapsular polar age-related cataract, bilateral: Secondary | ICD-10-CM | POA: Diagnosis not present

## 2019-07-12 DIAGNOSIS — Z1231 Encounter for screening mammogram for malignant neoplasm of breast: Secondary | ICD-10-CM | POA: Diagnosis not present

## 2019-07-21 ENCOUNTER — Other Ambulatory Visit: Payer: BC Managed Care – PPO

## 2019-07-21 ENCOUNTER — Ambulatory Visit: Payer: BC Managed Care – PPO

## 2019-07-28 ENCOUNTER — Other Ambulatory Visit: Payer: Self-pay

## 2019-07-28 ENCOUNTER — Inpatient Hospital Stay: Payer: BC Managed Care – PPO | Attending: Oncology

## 2019-07-28 ENCOUNTER — Inpatient Hospital Stay: Payer: BC Managed Care – PPO

## 2019-07-28 ENCOUNTER — Encounter: Payer: Self-pay | Admitting: *Deleted

## 2019-07-28 VITALS — BP 115/55 | HR 58 | Temp 98.0°F | Resp 18 | Wt 166.2 lb

## 2019-07-28 DIAGNOSIS — Z5111 Encounter for antineoplastic chemotherapy: Secondary | ICD-10-CM | POA: Insufficient documentation

## 2019-07-28 DIAGNOSIS — I158 Other secondary hypertension: Secondary | ICD-10-CM

## 2019-07-28 DIAGNOSIS — Z171 Estrogen receptor negative status [ER-]: Secondary | ICD-10-CM | POA: Diagnosis not present

## 2019-07-28 DIAGNOSIS — C50412 Malignant neoplasm of upper-outer quadrant of left female breast: Secondary | ICD-10-CM | POA: Insufficient documentation

## 2019-07-28 DIAGNOSIS — Z95828 Presence of other vascular implants and grafts: Secondary | ICD-10-CM

## 2019-07-28 DIAGNOSIS — Z79899 Other long term (current) drug therapy: Secondary | ICD-10-CM | POA: Diagnosis not present

## 2019-07-28 LAB — COMPREHENSIVE METABOLIC PANEL
ALT: 22 U/L (ref 0–44)
AST: 16 U/L (ref 15–41)
Albumin: 3.7 g/dL (ref 3.5–5.0)
Alkaline Phosphatase: 99 U/L (ref 38–126)
Anion gap: 9 (ref 5–15)
BUN: 19 mg/dL (ref 8–23)
CO2: 24 mmol/L (ref 22–32)
Calcium: 9.1 mg/dL (ref 8.9–10.3)
Chloride: 108 mmol/L (ref 98–111)
Creatinine, Ser: 0.99 mg/dL (ref 0.44–1.00)
GFR calc Af Amer: >60 mL/min (ref >60)
GFR calc non Af Amer: >60 mL/min (ref >60)
Glucose, Bld: 94 mg/dL (ref 70–99)
Potassium: 4 mmol/L (ref 3.5–5.1)
Sodium: 141 mmol/L (ref 135–145)
Total Bilirubin: 0.5 mg/dL (ref 0.3–1.2)
Total Protein: 7.4 g/dL (ref 6.5–8.1)

## 2019-07-28 LAB — CBC WITH DIFFERENTIAL/PLATELET
Abs Immature Granulocytes: 0.03 10*3/uL (ref 0.00–0.07)
Basophils Absolute: 0.1 10*3/uL (ref 0.0–0.1)
Basophils Relative: 1 %
Eosinophils Absolute: 0.2 10*3/uL (ref 0.0–0.5)
Eosinophils Relative: 4 %
HCT: 35.5 % — ABNORMAL LOW (ref 36.0–46.0)
Hemoglobin: 11.5 g/dL — ABNORMAL LOW (ref 12.0–15.0)
Immature Granulocytes: 1 %
Lymphocytes Relative: 29 %
Lymphs Abs: 1.4 10*3/uL (ref 0.7–4.0)
MCH: 29.5 pg (ref 26.0–34.0)
MCHC: 32.4 g/dL (ref 30.0–36.0)
MCV: 91 fL (ref 80.0–100.0)
Monocytes Absolute: 0.5 10*3/uL (ref 0.1–1.0)
Monocytes Relative: 11 %
Neutro Abs: 2.5 10*3/uL (ref 1.7–7.7)
Neutrophils Relative %: 54 %
Platelets: 285 10*3/uL (ref 150–400)
RBC: 3.9 MIL/uL (ref 3.87–5.11)
RDW: 13.4 % (ref 11.5–15.5)
WBC: 4.6 10*3/uL (ref 4.0–10.5)
nRBC: 0 % (ref 0.0–0.2)

## 2019-07-28 MED ORDER — ACETAMINOPHEN 325 MG PO TABS
650.0000 mg | ORAL_TABLET | Freq: Once | ORAL | Status: AC
  Administered 2019-07-28: 650 mg via ORAL

## 2019-07-28 MED ORDER — HEPARIN SOD (PORK) LOCK FLUSH 100 UNIT/ML IV SOLN
500.0000 [IU] | Freq: Once | INTRAVENOUS | Status: AC | PRN
  Administered 2019-07-28: 500 [IU]
  Filled 2019-07-28: qty 5

## 2019-07-28 MED ORDER — ACETAMINOPHEN 325 MG PO TABS
ORAL_TABLET | ORAL | Status: AC
  Filled 2019-07-28: qty 2

## 2019-07-28 MED ORDER — SODIUM CHLORIDE 0.9% FLUSH
10.0000 mL | INTRAVENOUS | Status: DC | PRN
  Administered 2019-07-28: 10 mL
  Filled 2019-07-28: qty 10

## 2019-07-28 MED ORDER — SODIUM CHLORIDE 0.9 % IV SOLN
Freq: Once | INTRAVENOUS | Status: AC
  Filled 2019-07-28: qty 250

## 2019-07-28 MED ORDER — TRASTUZUMAB-DKST CHEMO 150 MG IV SOLR
450.0000 mg | Freq: Once | INTRAVENOUS | Status: AC
  Administered 2019-07-28: 450 mg via INTRAVENOUS
  Filled 2019-07-28: qty 21.43

## 2019-07-28 NOTE — Patient Instructions (Signed)
[facility] °Discharge Instructions for Patients Receiving Chemotherapy ° °Today you received the following chemotherapy agents Trastuzumab ° °To help prevent nausea and vomiting after your treatment, we encourage you to take your nausea medication as directed. °  °If you develop nausea and vomiting that is not controlled by your nausea medication, call the clinic.  ° °BELOW ARE SYMPTOMS THAT SHOULD BE REPORTED IMMEDIATELY: °· *FEVER GREATER THAN 100.5 F °· *CHILLS WITH OR WITHOUT FEVER °· NAUSEA AND VOMITING THAT IS NOT CONTROLLED WITH YOUR NAUSEA MEDICATION °· *UNUSUAL SHORTNESS OF BREATH °· *UNUSUAL BRUISING OR BLEEDING °· TENDERNESS IN MOUTH AND THROAT WITH OR WITHOUT PRESENCE OF ULCERS °· *URINARY PROBLEMS °· *BOWEL PROBLEMS °· UNUSUAL RASH °Items with * indicate a potential emergency and should be followed up as soon as possible. ° °Feel free to call the clinic should you have any questions or concerns. The clinic phone number is [phone]. ° °Please show the CHEMO ALERT CARD at check-in to the Emergency Department and triage nurse. ° ° °

## 2019-08-06 ENCOUNTER — Other Ambulatory Visit: Payer: Self-pay

## 2019-08-06 ENCOUNTER — Ambulatory Visit (HOSPITAL_COMMUNITY): Payer: BC Managed Care – PPO | Attending: Cardiovascular Disease

## 2019-08-06 DIAGNOSIS — Z171 Estrogen receptor negative status [ER-]: Secondary | ICD-10-CM

## 2019-08-06 DIAGNOSIS — C50412 Malignant neoplasm of upper-outer quadrant of left female breast: Secondary | ICD-10-CM | POA: Diagnosis not present

## 2019-08-06 LAB — ECHOCARDIOGRAM COMPLETE
Area-P 1/2: 3.17 cm2
S' Lateral: 2.8 cm

## 2019-08-09 DIAGNOSIS — H25043 Posterior subcapsular polar age-related cataract, bilateral: Secondary | ICD-10-CM | POA: Diagnosis not present

## 2019-08-09 DIAGNOSIS — H04123 Dry eye syndrome of bilateral lacrimal glands: Secondary | ICD-10-CM | POA: Diagnosis not present

## 2019-08-18 ENCOUNTER — Inpatient Hospital Stay: Payer: BC Managed Care – PPO

## 2019-08-18 ENCOUNTER — Other Ambulatory Visit: Payer: Self-pay

## 2019-08-18 VITALS — BP 126/62 | HR 53 | Temp 98.2°F | Resp 18 | Ht 63.0 in | Wt 165.2 lb

## 2019-08-18 DIAGNOSIS — I158 Other secondary hypertension: Secondary | ICD-10-CM

## 2019-08-18 DIAGNOSIS — C50412 Malignant neoplasm of upper-outer quadrant of left female breast: Secondary | ICD-10-CM | POA: Diagnosis not present

## 2019-08-18 DIAGNOSIS — Z5111 Encounter for antineoplastic chemotherapy: Secondary | ICD-10-CM | POA: Diagnosis not present

## 2019-08-18 DIAGNOSIS — Z171 Estrogen receptor negative status [ER-]: Secondary | ICD-10-CM | POA: Diagnosis not present

## 2019-08-18 DIAGNOSIS — Z95828 Presence of other vascular implants and grafts: Secondary | ICD-10-CM

## 2019-08-18 DIAGNOSIS — Z79899 Other long term (current) drug therapy: Secondary | ICD-10-CM | POA: Diagnosis not present

## 2019-08-18 LAB — COMPREHENSIVE METABOLIC PANEL
ALT: 22 U/L (ref 0–44)
AST: 17 U/L (ref 15–41)
Albumin: 3.8 g/dL (ref 3.5–5.0)
Alkaline Phosphatase: 115 U/L (ref 38–126)
Anion gap: 9 (ref 5–15)
BUN: 16 mg/dL (ref 8–23)
CO2: 24 mmol/L (ref 22–32)
Calcium: 9.6 mg/dL (ref 8.9–10.3)
Chloride: 108 mmol/L (ref 98–111)
Creatinine, Ser: 1.07 mg/dL — ABNORMAL HIGH (ref 0.44–1.00)
GFR calc Af Amer: >60 mL/min (ref >60)
GFR calc non Af Amer: 55 mL/min — ABNORMAL LOW (ref >60)
Glucose, Bld: 92 mg/dL (ref 70–99)
Potassium: 4 mmol/L (ref 3.5–5.1)
Sodium: 141 mmol/L (ref 135–145)
Total Bilirubin: 0.6 mg/dL (ref 0.3–1.2)
Total Protein: 7.4 g/dL (ref 6.5–8.1)

## 2019-08-18 LAB — CBC WITH DIFFERENTIAL/PLATELET
Abs Immature Granulocytes: 0.02 10*3/uL (ref 0.00–0.07)
Basophils Absolute: 0.1 10*3/uL (ref 0.0–0.1)
Basophils Relative: 1 %
Eosinophils Absolute: 0.1 10*3/uL (ref 0.0–0.5)
Eosinophils Relative: 3 %
HCT: 36.8 % (ref 36.0–46.0)
Hemoglobin: 11.9 g/dL — ABNORMAL LOW (ref 12.0–15.0)
Immature Granulocytes: 0 %
Lymphocytes Relative: 29 %
Lymphs Abs: 1.4 10*3/uL (ref 0.7–4.0)
MCH: 29.4 pg (ref 26.0–34.0)
MCHC: 32.3 g/dL (ref 30.0–36.0)
MCV: 90.9 fL (ref 80.0–100.0)
Monocytes Absolute: 0.5 10*3/uL (ref 0.1–1.0)
Monocytes Relative: 11 %
Neutro Abs: 2.7 10*3/uL (ref 1.7–7.7)
Neutrophils Relative %: 56 %
Platelets: 276 10*3/uL (ref 150–400)
RBC: 4.05 MIL/uL (ref 3.87–5.11)
RDW: 12.9 % (ref 11.5–15.5)
WBC: 4.9 10*3/uL (ref 4.0–10.5)
nRBC: 0 % (ref 0.0–0.2)

## 2019-08-18 MED ORDER — SODIUM CHLORIDE 0.9 % IV SOLN
Freq: Once | INTRAVENOUS | Status: AC
  Filled 2019-08-18: qty 250

## 2019-08-18 MED ORDER — TRASTUZUMAB-DKST CHEMO 150 MG IV SOLR
450.0000 mg | Freq: Once | INTRAVENOUS | Status: AC
  Administered 2019-08-18: 450 mg via INTRAVENOUS
  Filled 2019-08-18: qty 21.43

## 2019-08-18 MED ORDER — SODIUM CHLORIDE 0.9% FLUSH
10.0000 mL | INTRAVENOUS | Status: DC | PRN
  Administered 2019-08-18: 10 mL
  Filled 2019-08-18: qty 10

## 2019-08-18 MED ORDER — HEPARIN SOD (PORK) LOCK FLUSH 100 UNIT/ML IV SOLN
500.0000 [IU] | Freq: Once | INTRAVENOUS | Status: AC | PRN
  Administered 2019-08-18: 500 [IU]
  Filled 2019-08-18: qty 5

## 2019-08-18 MED ORDER — ACETAMINOPHEN 325 MG PO TABS
ORAL_TABLET | ORAL | Status: AC
  Filled 2019-08-18: qty 2

## 2019-08-18 MED ORDER — ACETAMINOPHEN 325 MG PO TABS
650.0000 mg | ORAL_TABLET | Freq: Once | ORAL | Status: AC
  Administered 2019-08-18: 650 mg via ORAL

## 2019-08-18 NOTE — Patient Instructions (Signed)

## 2019-08-18 NOTE — Patient Instructions (Signed)
[facility] °Discharge Instructions for Patients Receiving Chemotherapy ° °Today you received the following chemotherapy agents Trastuzumab ° °To help prevent nausea and vomiting after your treatment, we encourage you to take your nausea medication as directed. °  °If you develop nausea and vomiting that is not controlled by your nausea medication, call the clinic.  ° °BELOW ARE SYMPTOMS THAT SHOULD BE REPORTED IMMEDIATELY: °· *FEVER GREATER THAN 100.5 F °· *CHILLS WITH OR WITHOUT FEVER °· NAUSEA AND VOMITING THAT IS NOT CONTROLLED WITH YOUR NAUSEA MEDICATION °· *UNUSUAL SHORTNESS OF BREATH °· *UNUSUAL BRUISING OR BLEEDING °· TENDERNESS IN MOUTH AND THROAT WITH OR WITHOUT PRESENCE OF ULCERS °· *URINARY PROBLEMS °· *BOWEL PROBLEMS °· UNUSUAL RASH °Items with * indicate a potential emergency and should be followed up as soon as possible. ° °Feel free to call the clinic should you have any questions or concerns. The clinic phone number is [phone]. ° °Please show the CHEMO ALERT CARD at check-in to the Emergency Department and triage nurse. ° ° °

## 2019-08-25 DIAGNOSIS — H25812 Combined forms of age-related cataract, left eye: Secondary | ICD-10-CM | POA: Diagnosis not present

## 2019-08-25 DIAGNOSIS — H2512 Age-related nuclear cataract, left eye: Secondary | ICD-10-CM | POA: Diagnosis not present

## 2019-09-02 DIAGNOSIS — Z0000 Encounter for general adult medical examination without abnormal findings: Secondary | ICD-10-CM | POA: Diagnosis not present

## 2019-09-02 DIAGNOSIS — E782 Mixed hyperlipidemia: Secondary | ICD-10-CM | POA: Diagnosis not present

## 2019-09-02 DIAGNOSIS — Z1159 Encounter for screening for other viral diseases: Secondary | ICD-10-CM | POA: Diagnosis not present

## 2019-09-07 DIAGNOSIS — F331 Major depressive disorder, recurrent, moderate: Secondary | ICD-10-CM | POA: Diagnosis not present

## 2019-09-07 DIAGNOSIS — E782 Mixed hyperlipidemia: Secondary | ICD-10-CM | POA: Diagnosis not present

## 2019-09-07 DIAGNOSIS — F411 Generalized anxiety disorder: Secondary | ICD-10-CM | POA: Diagnosis not present

## 2019-09-07 DIAGNOSIS — E039 Hypothyroidism, unspecified: Secondary | ICD-10-CM | POA: Diagnosis not present

## 2019-09-08 ENCOUNTER — Inpatient Hospital Stay: Payer: BC Managed Care – PPO

## 2019-09-08 ENCOUNTER — Other Ambulatory Visit: Payer: Self-pay

## 2019-09-08 ENCOUNTER — Inpatient Hospital Stay: Payer: BC Managed Care – PPO | Attending: Oncology

## 2019-09-08 VITALS — BP 110/61 | HR 63 | Temp 98.1°F | Resp 18

## 2019-09-08 DIAGNOSIS — I158 Other secondary hypertension: Secondary | ICD-10-CM

## 2019-09-08 DIAGNOSIS — C50412 Malignant neoplasm of upper-outer quadrant of left female breast: Secondary | ICD-10-CM | POA: Diagnosis not present

## 2019-09-08 DIAGNOSIS — Z5111 Encounter for antineoplastic chemotherapy: Secondary | ICD-10-CM | POA: Diagnosis not present

## 2019-09-08 DIAGNOSIS — Z79899 Other long term (current) drug therapy: Secondary | ICD-10-CM | POA: Insufficient documentation

## 2019-09-08 DIAGNOSIS — Z95828 Presence of other vascular implants and grafts: Secondary | ICD-10-CM

## 2019-09-08 DIAGNOSIS — Z171 Estrogen receptor negative status [ER-]: Secondary | ICD-10-CM | POA: Diagnosis not present

## 2019-09-08 LAB — CBC WITH DIFFERENTIAL/PLATELET
Abs Immature Granulocytes: 0.02 10*3/uL (ref 0.00–0.07)
Basophils Absolute: 0.1 10*3/uL (ref 0.0–0.1)
Basophils Relative: 1 %
Eosinophils Absolute: 0.2 10*3/uL (ref 0.0–0.5)
Eosinophils Relative: 3 %
HCT: 35.9 % — ABNORMAL LOW (ref 36.0–46.0)
Hemoglobin: 11.6 g/dL — ABNORMAL LOW (ref 12.0–15.0)
Immature Granulocytes: 0 %
Lymphocytes Relative: 24 %
Lymphs Abs: 1.2 10*3/uL (ref 0.7–4.0)
MCH: 29.2 pg (ref 26.0–34.0)
MCHC: 32.3 g/dL (ref 30.0–36.0)
MCV: 90.4 fL (ref 80.0–100.0)
Monocytes Absolute: 0.5 10*3/uL (ref 0.1–1.0)
Monocytes Relative: 9 %
Neutro Abs: 3.2 10*3/uL (ref 1.7–7.7)
Neutrophils Relative %: 63 %
Platelets: 289 10*3/uL (ref 150–400)
RBC: 3.97 MIL/uL (ref 3.87–5.11)
RDW: 12.6 % (ref 11.5–15.5)
WBC: 5.1 10*3/uL (ref 4.0–10.5)
nRBC: 0 % (ref 0.0–0.2)

## 2019-09-08 LAB — COMPREHENSIVE METABOLIC PANEL
ALT: 19 U/L (ref 0–44)
AST: 16 U/L (ref 15–41)
Albumin: 3.5 g/dL (ref 3.5–5.0)
Alkaline Phosphatase: 122 U/L (ref 38–126)
Anion gap: 8 (ref 5–15)
BUN: 16 mg/dL (ref 8–23)
CO2: 25 mmol/L (ref 22–32)
Calcium: 9.3 mg/dL (ref 8.9–10.3)
Chloride: 107 mmol/L (ref 98–111)
Creatinine, Ser: 0.96 mg/dL (ref 0.44–1.00)
GFR calc Af Amer: >60 mL/min (ref >60)
GFR calc non Af Amer: >60 mL/min (ref >60)
Glucose, Bld: 89 mg/dL (ref 70–99)
Potassium: 3.8 mmol/L (ref 3.5–5.1)
Sodium: 140 mmol/L (ref 135–145)
Total Bilirubin: 0.6 mg/dL (ref 0.3–1.2)
Total Protein: 7.1 g/dL (ref 6.5–8.1)

## 2019-09-08 MED ORDER — SODIUM CHLORIDE 0.9% FLUSH
10.0000 mL | INTRAVENOUS | Status: DC | PRN
  Administered 2019-09-08: 10 mL
  Filled 2019-09-08: qty 10

## 2019-09-08 MED ORDER — TRASTUZUMAB-DKST CHEMO 150 MG IV SOLR
450.0000 mg | Freq: Once | INTRAVENOUS | Status: AC
  Administered 2019-09-08: 450 mg via INTRAVENOUS
  Filled 2019-09-08: qty 21.43

## 2019-09-08 MED ORDER — HEPARIN SOD (PORK) LOCK FLUSH 100 UNIT/ML IV SOLN
500.0000 [IU] | Freq: Once | INTRAVENOUS | Status: AC | PRN
  Administered 2019-09-08: 500 [IU]
  Filled 2019-09-08: qty 5

## 2019-09-08 MED ORDER — ACETAMINOPHEN 325 MG PO TABS
ORAL_TABLET | ORAL | Status: AC
  Filled 2019-09-08: qty 2

## 2019-09-08 MED ORDER — SODIUM CHLORIDE 0.9 % IV SOLN
Freq: Once | INTRAVENOUS | Status: AC
  Filled 2019-09-08: qty 250

## 2019-09-08 MED ORDER — ACETAMINOPHEN 325 MG PO TABS
650.0000 mg | ORAL_TABLET | Freq: Once | ORAL | Status: AC
  Administered 2019-09-08: 650 mg via ORAL

## 2019-09-08 NOTE — Patient Instructions (Signed)
[facility] °Discharge Instructions for Patients Receiving Chemotherapy ° °Today you received the following chemotherapy agents Trastuzumab ° °To help prevent nausea and vomiting after your treatment, we encourage you to take your nausea medication as directed. °  °If you develop nausea and vomiting that is not controlled by your nausea medication, call the clinic.  ° °BELOW ARE SYMPTOMS THAT SHOULD BE REPORTED IMMEDIATELY: °· *FEVER GREATER THAN 100.5 F °· *CHILLS WITH OR WITHOUT FEVER °· NAUSEA AND VOMITING THAT IS NOT CONTROLLED WITH YOUR NAUSEA MEDICATION °· *UNUSUAL SHORTNESS OF BREATH °· *UNUSUAL BRUISING OR BLEEDING °· TENDERNESS IN MOUTH AND THROAT WITH OR WITHOUT PRESENCE OF ULCERS °· *URINARY PROBLEMS °· *BOWEL PROBLEMS °· UNUSUAL RASH °Items with * indicate a potential emergency and should be followed up as soon as possible. ° °Feel free to call the clinic should you have any questions or concerns. The clinic phone number is [phone]. ° °Please show the CHEMO ALERT CARD at check-in to the Emergency Department and triage nurse. ° ° °

## 2019-09-08 NOTE — Patient Instructions (Signed)

## 2019-09-15 ENCOUNTER — Ambulatory Visit (INDEPENDENT_AMBULATORY_CARE_PROVIDER_SITE_OTHER): Payer: BC Managed Care – PPO | Admitting: Cardiovascular Disease

## 2019-09-15 ENCOUNTER — Encounter: Payer: Self-pay | Admitting: Cardiovascular Disease

## 2019-09-15 ENCOUNTER — Other Ambulatory Visit: Payer: Self-pay

## 2019-09-15 VITALS — BP 130/58 | HR 70 | Ht 63.5 in | Wt 168.0 lb

## 2019-09-15 DIAGNOSIS — R0602 Shortness of breath: Secondary | ICD-10-CM

## 2019-09-15 DIAGNOSIS — I2584 Coronary atherosclerosis due to calcified coronary lesion: Secondary | ICD-10-CM

## 2019-09-15 DIAGNOSIS — B373 Candidiasis of vulva and vagina: Secondary | ICD-10-CM | POA: Diagnosis not present

## 2019-09-15 DIAGNOSIS — Z01419 Encounter for gynecological examination (general) (routine) without abnormal findings: Secondary | ICD-10-CM | POA: Diagnosis not present

## 2019-09-15 DIAGNOSIS — R0600 Dyspnea, unspecified: Secondary | ICD-10-CM

## 2019-09-15 DIAGNOSIS — Z23 Encounter for immunization: Secondary | ICD-10-CM | POA: Diagnosis not present

## 2019-09-15 DIAGNOSIS — I158 Other secondary hypertension: Secondary | ICD-10-CM

## 2019-09-15 DIAGNOSIS — I2510 Atherosclerotic heart disease of native coronary artery without angina pectoris: Secondary | ICD-10-CM | POA: Diagnosis not present

## 2019-09-15 DIAGNOSIS — E7800 Pure hypercholesterolemia, unspecified: Secondary | ICD-10-CM

## 2019-09-15 DIAGNOSIS — Z1211 Encounter for screening for malignant neoplasm of colon: Secondary | ICD-10-CM | POA: Diagnosis not present

## 2019-09-15 DIAGNOSIS — Z0000 Encounter for general adult medical examination without abnormal findings: Secondary | ICD-10-CM | POA: Diagnosis not present

## 2019-09-15 DIAGNOSIS — R0609 Other forms of dyspnea: Secondary | ICD-10-CM

## 2019-09-15 MED ORDER — FUROSEMIDE 20 MG PO TABS: ORAL_TABLET | ORAL | 1 refills | Status: DC

## 2019-09-15 NOTE — Patient Instructions (Signed)
Medication Instructions:  START FUROSEMIDE 20 MG DAILY FOR THE NEXT 2 DAYS AND THEN AS NEEDED FOR WEIGHT GAIN OF 2 POUNDS IN 24 HOURS OR 5 POUNDS IN 7 DAYS  *If you need a refill on your cardiac medications before your next appointment, please call your pharmacy*  Lab Work: NONE  Testing/Procedures: Your physician has requested that you have an echocardiogram. Echocardiography is a painless test that uses sound waves to create images of your heart. It provides your doctor with information about the size and shape of your heart and how well your heart's chambers and valves are working. This procedure takes approximately one hour. There are no restrictions for this procedure. [place] [address]   Follow-Up: At [facility], you and your health needs are our priority.  As part of our continuing mission to provide you with exceptional heart care, we have created designated Provider Care Teams.  These Care Teams include your primary Cardiologist (physician) and Advanced Practice Providers (APPs -  Physician Assistants and Nurse Practitioners) who all work together to provide you with the care you need, when you need it.  We recommend signing up for the patient portal called "MyChart".  Sign up information is provided on this After Visit Summary.  MyChart is used to connect with patients for Virtual Visits (Telemedicine).  Patients are able to view lab/test results, encounter notes, upcoming appointments, etc.  Non-urgent messages can be sent to your provider as well.   To learn more about what you can do with MyChart, go to [email or web address].    Your next appointment:   2 month(s)  The format for your next appointment:   In Person  Provider:   You may see Skeet Latch, MD or one of the following Advanced Practice Providers on your designated Care Team:    Kerin Ransom, PA-C  [place], [place]  Coletta Memos, [place]

## 2019-09-15 NOTE — Progress Notes (Signed)
Cardiology Office Note   Date:  09/15/2019   ID:  Dorothy Frederick, DOB 1956/01/20, MRN [id number]  PCP:  [name], MD  Cardiologist:   Skeet Latch, MD   No chief complaint on file.   History of Present Illness: Dorothy Frederick is a 64 y.o. female with hyperlipidemia, prior tobacco abuse, COPD, and breast cancer who presents for follow up.  She first saw Dr. Loletha Grayer in 2016 for shortness of breath.  At the time she was noted to have an abnormal EKG.  She was referred for an echo and a stress test.  Echocardiogram 09/2013 revealed LVEF 55 to 60% and was otherwise unremarkable.  Nuclear stress test was concerning for LAD ischemia.  She underwent cardiac catheterization and was found to have mild proximal LAD stenosis and a possible myocardial bridge in the distal LAD and 220% OM stenosis.  She was seen in the office by Dorothy Adas, PA-C on 09/2017 for preoperative risk assessment.  She was felt to be safe for surgery.  She was again seen for surgical care in 07/2018 in the setting of recurrent breast cancer requiring mastectomy.    She had no cardiac concerns and was deemed to be at acceptable risk for surgery.  Dorothy Frederick is currently getting chemotherapy with Trastuzumab.  Her last treatment is 09/2019.  She was on paclitaxel as well until 09/2018 when it was discontinued for peripheral neuropathy.  She ws previously treated with Tamoxifen for 1.5 years.  This was stopped due to financial difficulty.  For the last week she has noted abdominal bloating.  She weighs herself daily and usually her weight is 160 2 in the morning and 160 5 in the evening.  Lately her weight has been 160 5 in the morning 160 7 in the evening.  She denies any lower extremity edema.  She has no orthopnea or PND.  Her breathing has been stable.  Generally she is able to do whatever she wants, but she does gets tired with walking up inclines at times.  She has no chest pain or pressure.  Her blood pressure has been more  elevated as well.  She was started on irbesartan and it seems to be working.  Her last echo 07/2019 revealed LVEF 55 to 60% with grade 1 diastolic dysfunction.  Right atrial pressure was 3 mmHg.  GLS was not reported.  This was unchanged from prior.  She notes that her PCP recently checked her lipids and will ask them to send [place] a copy.   Past Medical History:  Diagnosis Date  . Angina at rest [facility])   . Anxiety   . Cancer [facility]) 2000   left breast cancer-partial mastectomy  . COPD (chronic obstructive pulmonary disease) ([place])   . Depression   . Family history of breast cancer   . Family history of esophageal cancer   . Family history of lung cancer   . Family history of prostate cancer   . GERD (gastroesophageal reflux disease)   . Hyperlipidemia   . Hypertension   . Hypothyroidism   . Recurrent breast cancer, left [facility])     Past Surgical History:  Procedure Laterality Date  . ADENOIDECTOMY    . APPLICATION OF WOUND VAC Left 04/01/2019   Procedure: Placement of provena wound vac;  Surgeon: Wallace Going, DO;  Location: [place];  Service: Plastics;  Laterality: Left;  . DEBRIDEMENT AND CLOSURE WOUND Left 04/01/2019   Procedure: Left breast wound excision  and closure;  Surgeon: Wallace Going, DO;  Location: [place];  Service: Plastics;  Laterality: Left;  . LEFT HEART CATHETERIZATION WITH CORONARY ANGIOGRAM N/A 10/21/2013   Procedure: LEFT HEART CATHETERIZATION WITH CORONARY ANGIOGRAM;  Surgeon: Burnell Blanks, MD;  Location: [facility] CATH LAB;  Service: Cardiovascular;  Laterality: N/A;  . MASTECTOMY W/ SENTINEL NODE BIOPSY Left 09/16/2018   Procedure: LEFT TOTAL MASTECTOMY WITH LEFT AXILLARY DEEP SENTINEL LYMPH NODE BIOPSY AND INJECT BLUE DYE;  Surgeon: Fanny Skates, MD;  Location: [place];  Service: General;  Laterality: Left;  Marland Kitchen MASTECTOMY, PARTIAL Left 2000  . PORTACATH PLACEMENT Right 09/16/2018   Procedure:  INSERTION PORT-A-CATH;  Surgeon: Fanny Skates, MD;  Location: [place];  Service: General;  Laterality: Right;  . TONSILLECTOMY       Current Outpatient Medications  Medication Sig Dispense Refill  . albuterol (PROVENTIL HFA;VENTOLIN HFA) 108 (90 BASE) MCG/ACT inhaler Inhale 2 puffs into the lungs every 6 (six) hours as needed for wheezing or shortness of breath.    Marland Kitchen atorvastatin (LIPITOR) 20 MG tablet Take 20 mg by mouth at bedtime.    . Cholecalciferol 1000 UNITS capsule Take 500 Units by mouth daily.     Marland Kitchen escitalopram (LEXAPRO) 10 MG tablet Take 10 mg by mouth daily.  3  . Fluticasone-Umeclidin-Vilant (TRELEGY ELLIPTA) 100-62.5-25 MCG/INH AEPB Inhale 1 puff into the lungs daily as needed.    Marland Kitchen levothyroxine (SYNTHROID, LEVOTHROID) 50 MCG tablet Take 1 tablet by mouth every other day. Alternate 50 mcg and 75 mcg every other day    . LORazepam (ATIVAN) 0.5 MG tablet Take 1 tablet (0.5 mg total) by mouth at bedtime as needed (Nausea or vomiting). 30 tablet 0  . olmesartan (BENICAR) 20 MG tablet Take 20 mg by mouth daily.    . furosemide (LASIX) 20 MG tablet TAKE ONE TABLET AS NEEDED FOR WEIGHT GAIN OF 2 POUNDS IN 24 HOURS OR 5 POUNDS IN 7 DAYS 30 tablet 1   No current facility-administered medications for this visit.   Facility-Administered Medications Ordered in Other Visits  Medication Dose Route Frequency Provider Last Rate Last Admin  . 0.9 %  sodium chloride infusion   Intravenous Continuous Gardenia Phlegm, NP   Stopped at 12/24/18 1548    Allergies:   Benadryl [diphenhydramine hcl (sleep)] and Sulfa antibiotics    Social History:  The patient  reports that she has quit smoking. Her smoking use included cigarettes. She has a 38.00 pack-year smoking history. She has never used smokeless tobacco. She reports that she does not drink alcohol and does not use drugs.   Family History:  The patient's family history includes Breast cancer in her maternal aunt  and sister; Cancer in her father; Esophageal cancer in her maternal aunt; Heart disease in her mother and sister; Hyperlipidemia in her maternal grandmother; Hypertension in her brother, brother, father, and sister; Lung cancer in her maternal aunt; Prostate cancer in her father; Stomach cancer in her paternal grandmother; Stroke in her maternal grandmother.    ROS:  Please see the history of present illness.   Otherwise, review of systems are positive for none.   All other systems are reviewed and negative.    PHYSICAL EXAM: VS:  BP (!) 130/58   Pulse 70   Ht 5' 3.5" (1.613 m)   Wt 168 lb (76.2 kg)   SpO2 97%   BMI 29.29 kg/m  , BMI Body mass index is 29.29  kg/m. GENERAL:  Well appearing HEENT:  Pupils equal round and reactive, fundi not visualized, oral mucosa unremarkable NECK:  No jugular venous distention, waveform within normal limits, carotid upstroke brisk and symmetric, no bruits, no thyromegaly LYMPHATICS:  No cervical adenopathy LUNGS:  Clear to auscultation bilaterally HEART:  RRR.  PMI not displaced or sustained,S1 and S2 within normal limits, no S3, no S4, no clicks, no rubs, no murmurs ABD:  Flat, positive bowel sounds normal in frequency in pitch, no bruits, no rebound, no guarding, no midline pulsatile mass, no hepatomegaly, no splenomegaly EXT:  2 plus pulses throughout, no edema, no cyanosis no clubbing SKIN:  No rashes no nodules NEURO:  Cranial nerves II through XII grossly intact, motor grossly intact throughout PSYCH:  Cognitively intact, oriented to person place and time  EKG:  EKG is ordered today. The ekg ordered today demonstrates sinus rhythm.  Rate 70 bpm.  Cannot rule out prior inferior infarct.  Recent Labs: 09/08/2019: ALT 19; BUN 16; Creatinine, Ser 0.96; Hemoglobin 11.6; Platelets 289; Potassium 3.8; Sodium 140    Lipid Panel No results found for: CHOL, TRIG, HDL, CHOLHDL, VLDL, LDLCALC, LDLDIRECT    Wt Readings from Last 3 Encounters:  09/15/19  168 lb (76.2 kg)  08/18/19 165 lb 4 oz (75 kg)  07/28/19 166 lb 4 oz (75.4 kg)     ASSESSMENT AND PLAN:  # Acute diastolic heart failure: She appears euvolemic on exam but reports abdominal bloating and her weight is up.  We will repeat a limited echo to make sure there is no new heart failure.  We will start lasix 20mg  daily as needed for weight gain or shortness of breath.  She will take a dose today and tomorrow.  # Essential hypertension:   Blood pressure well-controlled on the losartan.  I have asked her to start checking it at home.  If it improves once she stops chemotherapy she may be able to stop it.  # Non-obstructive CAD: # Hyperlipidemia: Mild LAD and OM disease on cath previously.  She is asymptomatic.  Continue atorvastatin.  Lipids were recently checked with PCP.  Her LDL goal should be less than 70.   Current medicines are reviewed at length with the patient today.  The patient does not have concerns regarding medicines.  The following changes have been made:  Lasix prn  Labs/ tests ordered today include:   Orders Placed This Encounter  Procedures  . EKG 12-Lead  . ECHOCARDIOGRAM LIMITED     Disposition:   FU with Jaeleen Inzunza C. Oval Linsey, MD, [facility] in 2 months     Signed, Zayde Stroupe C. Oval Linsey, MD, [facility]  09/15/2019 12:20 PM    [place]

## 2019-09-22 DIAGNOSIS — Z853 Personal history of malignant neoplasm of breast: Secondary | ICD-10-CM | POA: Diagnosis not present

## 2019-09-28 ENCOUNTER — Ambulatory Visit (HOSPITAL_COMMUNITY): Payer: BC Managed Care – PPO | Attending: Cardiology

## 2019-09-28 ENCOUNTER — Other Ambulatory Visit: Payer: Self-pay

## 2019-09-28 DIAGNOSIS — R0602 Shortness of breath: Secondary | ICD-10-CM | POA: Diagnosis not present

## 2019-09-28 LAB — ECHOCARDIOGRAM LIMITED
Area-P 1/2: 3.37 cm2
S' Lateral: 3 cm

## 2019-09-29 ENCOUNTER — Other Ambulatory Visit: Payer: BC Managed Care – PPO

## 2019-09-29 ENCOUNTER — Ambulatory Visit: Payer: BC Managed Care – PPO

## 2019-09-29 ENCOUNTER — Encounter: Payer: Self-pay | Admitting: *Deleted

## 2019-09-29 ENCOUNTER — Ambulatory Visit: Payer: BC Managed Care – PPO | Admitting: Oncology

## 2019-09-29 DIAGNOSIS — H25811 Combined forms of age-related cataract, right eye: Secondary | ICD-10-CM | POA: Diagnosis not present

## 2019-09-29 DIAGNOSIS — H2511 Age-related nuclear cataract, right eye: Secondary | ICD-10-CM | POA: Diagnosis not present

## 2019-10-06 ENCOUNTER — Inpatient Hospital Stay (HOSPITAL_BASED_OUTPATIENT_CLINIC_OR_DEPARTMENT_OTHER): Payer: BC Managed Care – PPO | Admitting: Adult Health

## 2019-10-06 ENCOUNTER — Inpatient Hospital Stay: Payer: BC Managed Care – PPO

## 2019-10-06 ENCOUNTER — Other Ambulatory Visit: Payer: Self-pay

## 2019-10-06 ENCOUNTER — Encounter: Payer: Self-pay | Admitting: *Deleted

## 2019-10-06 ENCOUNTER — Inpatient Hospital Stay: Payer: BC Managed Care – PPO | Attending: Oncology

## 2019-10-06 VITALS — BP 110/60 | HR 62 | Temp 97.9°F | Resp 17 | Ht 63.5 in | Wt 168.3 lb

## 2019-10-06 DIAGNOSIS — C50412 Malignant neoplasm of upper-outer quadrant of left female breast: Secondary | ICD-10-CM | POA: Insufficient documentation

## 2019-10-06 DIAGNOSIS — Z79899 Other long term (current) drug therapy: Secondary | ICD-10-CM | POA: Insufficient documentation

## 2019-10-06 DIAGNOSIS — Z5111 Encounter for antineoplastic chemotherapy: Secondary | ICD-10-CM | POA: Insufficient documentation

## 2019-10-06 DIAGNOSIS — Z171 Estrogen receptor negative status [ER-]: Secondary | ICD-10-CM | POA: Diagnosis not present

## 2019-10-06 DIAGNOSIS — Z9012 Acquired absence of left breast and nipple: Secondary | ICD-10-CM | POA: Insufficient documentation

## 2019-10-06 DIAGNOSIS — I158 Other secondary hypertension: Secondary | ICD-10-CM

## 2019-10-06 LAB — CBC WITH DIFFERENTIAL/PLATELET
Abs Immature Granulocytes: 0.01 10*3/uL (ref 0.00–0.07)
Basophils Absolute: 0.1 10*3/uL (ref 0.0–0.1)
Basophils Relative: 1 %
Eosinophils Absolute: 0.2 10*3/uL (ref 0.0–0.5)
Eosinophils Relative: 3 %
HCT: 37 % (ref 36.0–46.0)
Hemoglobin: 12 g/dL (ref 12.0–15.0)
Immature Granulocytes: 0 %
Lymphocytes Relative: 25 %
Lymphs Abs: 1.3 10*3/uL (ref 0.7–4.0)
MCH: 29.2 pg (ref 26.0–34.0)
MCHC: 32.4 g/dL (ref 30.0–36.0)
MCV: 90 fL (ref 80.0–100.0)
Monocytes Absolute: 0.5 10*3/uL (ref 0.1–1.0)
Monocytes Relative: 9 %
Neutro Abs: 3.2 10*3/uL (ref 1.7–7.7)
Neutrophils Relative %: 62 %
Platelets: 266 10*3/uL (ref 150–400)
RBC: 4.11 MIL/uL (ref 3.87–5.11)
RDW: 12.7 % (ref 11.5–15.5)
WBC: 5.2 10*3/uL (ref 4.0–10.5)
nRBC: 0 % (ref 0.0–0.2)

## 2019-10-06 LAB — COMPREHENSIVE METABOLIC PANEL
ALT: 23 U/L (ref 0–44)
AST: 18 U/L (ref 15–41)
Albumin: 3.6 g/dL (ref 3.5–5.0)
Alkaline Phosphatase: 120 U/L (ref 38–126)
Anion gap: 8 (ref 5–15)
BUN: 15 mg/dL (ref 8–23)
CO2: 24 mmol/L (ref 22–32)
Calcium: 9 mg/dL (ref 8.9–10.3)
Chloride: 108 mmol/L (ref 98–111)
Creatinine, Ser: 0.98 mg/dL (ref 0.44–1.00)
GFR calc Af Amer: >60 mL/min (ref >60)
GFR calc non Af Amer: >60 mL/min (ref >60)
Glucose, Bld: 93 mg/dL (ref 70–99)
Potassium: 4.1 mmol/L (ref 3.5–5.1)
Sodium: 140 mmol/L (ref 135–145)
Total Bilirubin: 0.5 mg/dL (ref 0.3–1.2)
Total Protein: 7.4 g/dL (ref 6.5–8.1)

## 2019-10-06 MED ORDER — ACETAMINOPHEN 325 MG PO TABS
ORAL_TABLET | ORAL | Status: AC
  Filled 2019-10-06: qty 2

## 2019-10-06 MED ORDER — HEPARIN SOD (PORK) LOCK FLUSH 100 UNIT/ML IV SOLN
500.0000 [IU] | Freq: Once | INTRAVENOUS | Status: AC | PRN
  Administered 2019-10-06: 500 [IU]
  Filled 2019-10-06: qty 5

## 2019-10-06 MED ORDER — SODIUM CHLORIDE 0.9 % IV SOLN
Freq: Once | INTRAVENOUS | Status: AC
  Filled 2019-10-06: qty 250

## 2019-10-06 MED ORDER — SODIUM CHLORIDE 0.9% FLUSH
10.0000 mL | INTRAVENOUS | Status: DC | PRN
  Administered 2019-10-06: 10 mL
  Filled 2019-10-06: qty 10

## 2019-10-06 MED ORDER — TRASTUZUMAB-DKST CHEMO 150 MG IV SOLR
450.0000 mg | Freq: Once | INTRAVENOUS | Status: AC
  Administered 2019-10-06: 450 mg via INTRAVENOUS
  Filled 2019-10-06: qty 21.43

## 2019-10-06 MED ORDER — ACETAMINOPHEN 325 MG PO TABS
650.0000 mg | ORAL_TABLET | Freq: Once | ORAL | Status: AC
  Administered 2019-10-06: 650 mg via ORAL

## 2019-10-06 NOTE — Patient Instructions (Signed)
[facility] °Discharge Instructions for Patients Receiving Chemotherapy ° °Today you received the following chemotherapy agents Trastuzumab ° °To help prevent nausea and vomiting after your treatment, we encourage you to take your nausea medication as directed. °  °If you develop nausea and vomiting that is not controlled by your nausea medication, call the clinic.  ° °BELOW ARE SYMPTOMS THAT SHOULD BE REPORTED IMMEDIATELY: °· *FEVER GREATER THAN 100.5 F °· *CHILLS WITH OR WITHOUT FEVER °· NAUSEA AND VOMITING THAT IS NOT CONTROLLED WITH YOUR NAUSEA MEDICATION °· *UNUSUAL SHORTNESS OF BREATH °· *UNUSUAL BRUISING OR BLEEDING °· TENDERNESS IN MOUTH AND THROAT WITH OR WITHOUT PRESENCE OF ULCERS °· *URINARY PROBLEMS °· *BOWEL PROBLEMS °· UNUSUAL RASH °Items with * indicate a potential emergency and should be followed up as soon as possible. ° °Feel free to call the clinic should you have any questions or concerns. The clinic phone number is [phone]. ° °Please show the CHEMO ALERT CARD at check-in to the Emergency Department and triage nurse. ° ° °

## 2019-10-06 NOTE — Progress Notes (Signed)
Dorothy Frederick  Telephone:([phone]) [phone] Fax:(336) [phone]    ID: Dorothy Frederick DOB: Dec 02, 1955  MR#: [id number]  VOZ#:[id number]  Patient Care Team: [name], MD as PCP - General (Family Medicine) Skeet Latch, MD as PCP - Cardiology (Cardiology) Magrinat, Virgie Dad, MD as Consulting Physician (Oncology) Gery Pray, MD as Consulting Physician (Radiation Oncology) Sanda Klein, MD as Consulting Physician (Cardiology) Coralie Keens, MD as Consulting Physician (General Surgery) OTHER MD:   CHIEF COMPLAINT: Estrogen receptor negative breast cancer  CURRENT TREATMENT: Trastuzumab   INTERVAL HISTORY: Trea returns today for follow-up and treatment of her estrogen receptor negative breast cancer.   She continues on trastuzumab. She underwent repeat echocardiogram on 09/28/2019 showing an ejection fraction of 55-60%.   REVIEW OF SYSTEMS: Dorothy Frederick continues on trastuzumab every 3 weeks with good tolerance.  Today is her last dose.  She is delighted to be finishing the Trastuzumab.  She denies any new issues today such as fever, chills, chest pain, palpitations, cough, shortness of breath, headaches, nausea, vomiting, or any other concerns.  A detailed ROS was otherwise non contributory today.     HISTORY OF CURRENT ILLNESS: From the original intake note:  Dorothy Frederick has a history of left breast cancer diagnosed in 2000, and treated with a partial mastectomy.  She has been followed by Dr. Matthew Saras in [facility]. She was also treated with Tamoxifen for 1.5 years, which she tolerated well, but had to discontinue due to financial difficulties. She did not receive chemotherapy. She notes that her cancer was lymph node negative.  More recently, she had routine screening mammography on 07/06/2018 showing: Breast Density Category B. On mammography, there is a 1.5 cm oval mass with associated calcifications in the left breast upper outer quadrant posterior depth  6.6 cm from the nipple. This mass is approximately 3 cm anterior to the lumpectomy site in the upper outer posterior left breast. No other significant masses, calcifications, or other findings are seen in either breast.   Accordingly on 07/13/2018 she proceeded to biopsy of the left breast area in question. The pathology from this procedure showed ([id number]): invasive ductal carcinoma, grade III, 2 o'clock, 5 cm from the nipple. Prognostic indicators significant for: estrogen receptor, 0% negative and progesterone receptor, 0% negative. Proliferation marker Ki67 at 70%. HER2 positive (3+) by immunohistochemistry.  The patient's subsequent history is as detailed below.   PAST MEDICAL HISTORY: Past Medical History:  Diagnosis Date  . Angina at rest [facility])   . Anxiety   . Cancer [facility]) 2000   left breast cancer-partial mastectomy  . COPD (chronic obstructive pulmonary disease) ([place])   . Depression   . Family history of breast cancer   . Family history of esophageal cancer   . Family history of lung cancer   . Family history of prostate cancer   . GERD (gastroesophageal reflux disease)   . Hyperlipidemia   . Hypertension   . Hypothyroidism   . Recurrent breast cancer, left ([place])     PAST SURGICAL HISTORY: Past Surgical History:  Procedure Laterality Date  . ADENOIDECTOMY    . APPLICATION OF WOUND VAC Left 04/01/2019   Procedure: Placement of provena wound vac;  Surgeon: Wallace Going, DO;  Location: [place];  Service: Plastics;  Laterality: Left;  . DEBRIDEMENT AND CLOSURE WOUND Left 04/01/2019   Procedure: Left breast wound excision and closure;  Surgeon: Wallace Going, DO;  Location: [place];  Service: Plastics;  Laterality: Left;  .  LEFT HEART CATHETERIZATION WITH CORONARY ANGIOGRAM N/A 10/21/2013   Procedure: LEFT HEART CATHETERIZATION WITH CORONARY ANGIOGRAM;  Surgeon: Burnell Blanks, MD;  Location: [facility] CATH LAB;  Service:  Cardiovascular;  Laterality: N/A;  . MASTECTOMY W/ SENTINEL NODE BIOPSY Left 09/16/2018   Procedure: LEFT TOTAL MASTECTOMY WITH LEFT AXILLARY DEEP SENTINEL LYMPH NODE BIOPSY AND INJECT BLUE DYE;  Surgeon: Fanny Skates, MD;  Location: [place];  Service: General;  Laterality: Left;  Marland Kitchen MASTECTOMY, PARTIAL Left 2000  . PORTACATH PLACEMENT Right 09/16/2018   Procedure: INSERTION PORT-A-CATH;  Surgeon: Fanny Skates, MD;  Location: [place];  Service: General;  Laterality: Right;  . TONSILLECTOMY      FAMILY HISTORY: Family History  Problem Relation Age of Onset  . Heart disease Mother   . Hypertension Frederick   . Cancer Frederick   . Prostate cancer Frederick   . Hypertension Sister   . Breast cancer Sister        dx 61s  . Heart disease Sister   . Hypertension Brother   . Hyperlipidemia Maternal Grandmother   . Stroke Maternal Grandmother   . Hypertension Brother   . Breast cancer Maternal Aunt   . Lung cancer Maternal Aunt   . Esophageal cancer Maternal Aunt   . Stomach cancer Paternal Grandmother    Dorothy Frederick died from prostate cancer at age 66. Patients' mother died from heart complications at age 26. The patient has 2 half brothers and 2 half sisters. Patient denies anyone in her family having ovarian, prostate, or pancreatic cancer. 1 maternal aunt and 103 half aunt (half-sister of the patient's mother) had breast cancer breast cancer.    GYNECOLOGIC HISTORY:  No LMP recorded. Patient is postmenopausal. 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 She is divorced. She lives with her daughter and two grandchildren. Her daughter, Dorothy Frederick, is 34 and works for [employer] as a Clinical research associate.  Dorothy Frederick, 13, and Dorothy Frederick, 10  also live with the patient. Ranisha's other daughter, Dorothy Frederick, is a Licensed conveyancer at [employer]. Dorothy Frederick has 4 grandchildren in total.   ADVANCED DIRECTIVES:  Not in place. Dorothy Frederick was given the appropriate forms on 07/22/18 to fill out and return at their own discretion.     HEALTH MAINTENANCE: Social History   Tobacco Use  . Smoking status: Former Smoker    Packs/day: 1.00    Years: 38.00    Pack years: 38.00    Types: Cigarettes  . Smokeless tobacco: Never Used  Substance Use Topics  . Alcohol use: No  . Drug use: No    Colonoscopy: 2017  PAP:    Bone density: yes, [facility]; osteoporosis Mammography: up to date  Allergies  Allergen Reactions  . Benadryl [Diphenhydramine Hcl (Sleep)] Palpitations    Increased heartrate  . Sulfa Antibiotics Rash    Current Outpatient Medications  Medication Sig Dispense Refill  . albuterol (PROVENTIL HFA;VENTOLIN HFA) 108 (90 BASE) MCG/ACT inhaler Inhale 2 puffs into the lungs every 6 (six) hours as needed for wheezing or shortness of breath.    Marland Kitchen atorvastatin (LIPITOR) 20 MG tablet Take 20 mg by mouth at bedtime.    . Cholecalciferol 1000 UNITS capsule Take 500 Units by mouth daily.     Marland Kitchen escitalopram (LEXAPRO)  10 MG tablet Take 10 mg by mouth daily.  3  . Fluticasone-Umeclidin-Vilant (TRELEGY ELLIPTA) 100-62.5-25 MCG/INH AEPB Inhale 1 puff into the lungs daily as needed.    . furosemide (LASIX) 20 MG tablet TAKE ONE TABLET AS NEEDED FOR WEIGHT GAIN OF 2 POUNDS IN 24 HOURS OR 5 POUNDS IN 7 DAYS 30 tablet 1  . levothyroxine (SYNTHROID, LEVOTHROID) 50 MCG tablet Take 1 tablet by mouth every other day. Alternate 50 mcg and 75 mcg every other day    . LORazepam (ATIVAN) 0.5 MG tablet Take 1 tablet (0.5 mg total) by mouth at bedtime as needed (Nausea or vomiting). 30 tablet 0  . olmesartan (BENICAR) 20 MG tablet Take 20 mg by mouth daily.     No current facility-administered medications for this visit.   Facility-Administered Medications Ordered in Other Visits    Medication Dose Route Frequency Provider Last Rate Last Admin  . 0.9 %  sodium chloride infusion   Intravenous Continuous Gardenia Phlegm, NP   Stopped at 12/24/18 1548     OBJECTIVE: white woman in no acute distress  Vitals:   10/06/19 1443  BP: 110/60  Pulse: 62  Resp: 17  Temp: 97.9 F (36.6 C)  SpO2: 98%   Wt Readings from Last 3 Encounters:  10/06/19 168 lb 4.8 oz (76.3 kg)  09/15/19 168 lb (76.2 kg)  08/18/19 165 lb 4 oz (75 kg)   Body mass index is 29.35 kg/m.    ECOG FS:1 - Symptomatic but completely ambulatory GENERAL: Patient is a well appearing female in no acute distress HEENT:  Sclerae anicteric.  Oropharynx clear and moist. No ulcerations or evidence of oropharyngeal candidiasis. Neck is supple.  NODES:  No cervical, supraclavicular, or axillary lymphadenopathy palpated.  BREAST EXAM:  Left breast s/Frederick mastectomy, no sign of local recurrence, right breast benign LUNGS:  Clear to auscultation bilaterally.  No wheezes or rhonchi. HEART:  Regular rate and rhythm. No murmur appreciated. ABDOMEN:  Soft, nontender.  Positive, normoactive bowel sounds. No organomegaly palpated. MSK:  No focal spinal tenderness to palpation. Full range of motion bilaterally in the upper extremities. EXTREMITIES:  No peripheral edema.   SKIN:  Clear with no obvious rashes or skin changes. No nail dyscrasia. NEURO:  Nonfocal. Well oriented.  Appropriate affect.     LAB RESULTS:  CMP     Component Value Date/Time   NA 140 10/06/2019 1410   K 4.1 10/06/2019 1410   CL 108 10/06/2019 1410   CO2 24 10/06/2019 1410   GLUCOSE 93 10/06/2019 1410   BUN 15 10/06/2019 1410   CREATININE 0.98 10/06/2019 1410   CREATININE 1.11 (H) 07/22/2018 0835   CREATININE 0.97 10/18/2013 1527   CALCIUM 9.0 10/06/2019 1410   PROT 7.4 10/06/2019 1410   ALBUMIN 3.6 10/06/2019 1410   AST 18 10/06/2019 1410   AST 17 07/22/2018 0835   ALT 23 10/06/2019 1410   ALT 20 07/22/2018 0835   ALKPHOS  120 10/06/2019 1410   BILITOT 0.5 10/06/2019 1410   BILITOT 0.6 07/22/2018 0835   GFRNONAA >60 10/06/2019 1410   GFRNONAA 53 (L) 07/22/2018 0835   GFRAA >60 10/06/2019 1410   GFRAA >60 07/22/2018 0835    No results found for: TOTALPROTELP, ALBUMINELP, A1GS, A2GS, BETS, BETA2SER, GAMS, MSPIKE, SPEI  No results found for: KPAFRELGTCHN, LAMBDASER, KAPLAMBRATIO  Lab Results  Component Value Date   WBC 5.2 10/06/2019   NEUTROABS 3.2 10/06/2019   HGB 12.0 10/06/2019   HCT 37.0  10/06/2019   MCV 90.0 10/06/2019   PLT 266 10/06/2019    Lab Results  Component Value Date   LABCA2 24 09/08/2006    No components found for: FAOZHY865  No results for input(s): INR in the last 168 hours.  Lab Results  Component Value Date   LABCA2 24 09/08/2006    No results found for: HQI696  No results found for: EXB284  No results found for: XLK440  No results found for: CA2729  No components found for: HGQUANT  No results found for: CEA1 / No results found for: CEA1   No results found for: AFPTUMOR  No results found for: CHROMOGRNA  No results found for: HGBA, HGBA2QUANT, HGBFQUANT, HGBSQUAN (Hemoglobinopathy evaluation)   No results found for: LDH  No results found for: IRON, TIBC, IRONPCTSAT (Iron and TIBC)  No results found for: FERRITIN   Urinalysis No results found for: COLORURINE, APPEARANCEUR, LABSPEC, PHURINE, GLUCOSEU, HGBUR, BILIRUBINUR, KETONESUR, PROTEINUR, UROBILINOGEN, NITRITE, LEUKOCYTESUR   STUDIES:  ECHOCARDIOGRAM LIMITED  Result Date: 09/28/2019    ECHOCARDIOGRAM LIMITED REPORT   Patient Name:   Dorothy Frederick Date of Exam: 09/28/2019 Medical Rec #:  [id number]     Height:       63.5 in Accession #:    [id number]    Weight:       168.0 lb Date of Birth:  September 05, 1955    BSA:          1.806 m Patient Age:    63 years      BP:           119/66 mmHg Patient Gender: F             HR:           60 bpm. Exam Location:  [facility] Procedure: Cardiac Doppler, Limited  Echo and Limited Color Doppler Indications:    R06.00 SOB  History:        Patient has prior history of Echocardiogram examinations, most                 recent 08/06/2019. Abnormal ECG, COPD and Breast Cancer,                 Signs/Symptoms:Shortness of Breath; Risk Factors:Former Smoker                 and HLD.  Sonographer:    Clearence Ped RCS Referring Phys: [id number] TIFFANY Dixon IMPRESSIONS  1. Left ventricular ejection fraction, by estimation, is 55 to 60%. The left ventricle has normal function. The left ventricle has no regional wall motion abnormalities. Left ventricular diastolic parameters are consistent with Grade I diastolic dysfunction (impaired relaxation). GLS -18.6%, within normal range.  2. Right ventricular systolic function is normal. The right ventricular size is normal. There is normal pulmonary artery systolic pressure. The estimated right ventricular systolic pressure is 18.1 mmHg.  3. The mitral valve is normal in structure. Trivial mitral valve regurgitation. No evidence of mitral stenosis.  4. The aortic valve is tricuspid. Aortic valve regurgitation is not visualized. No aortic stenosis is present.  5. The inferior vena cava is normal in size with greater than 50% respiratory variability, suggesting right atrial pressure of 3 mmHg. FINDINGS  Left Ventricle: Left ventricular ejection fraction, by estimation, is 55 to 60%. The left ventricle has normal function. The left ventricle has no regional wall motion abnormalities. The left ventricular internal cavity size was normal in size. There is  no left ventricular hypertrophy.  Right Ventricle: The right ventricular size is normal. No increase in right ventricular wall thickness. Right ventricular systolic function is normal. There is normal pulmonary artery systolic pressure. The tricuspid regurgitant velocity is 1.94 m/s, and  with an assumed right atrial pressure of 3 mmHg, the estimated right ventricular systolic pressure is 41.7 mmHg.  Left Atrium: Left atrial size was normal in size. Right Atrium: Right atrial size was normal in size. Pericardium: Trivial pericardial effusion is present. Mitral Valve: The mitral valve is normal in structure. Trivial mitral valve regurgitation. No evidence of mitral valve stenosis. Aortic Valve: The aortic valve is tricuspid. Aortic valve regurgitation is not visualized. No aortic stenosis is present. Pulmonic Valve: The pulmonic valve was normal in structure. Pulmonic valve regurgitation is trivial. Aorta: The aortic root is normal in size and structure. Venous: The inferior vena cava is normal in size with greater than 50% respiratory variability, suggesting right atrial pressure of 3 mmHg. LEFT VENTRICLE PLAX 2D LVIDd:         4.90 cm  Diastology LVIDs:         3.00 cm  LV e' lateral:   10.00 cm/s LV PW:         1.00 cm  LV E/e' lateral: 7.7 LV IVS:        0.70 cm  LV e' medial:    5.22 cm/s LVOT diam:     2.00 cm  LV E/e' medial:  14.8 LVOT Area:     3.14 cm                          3D Volume EF:                         3D EF:        62 %                         LV EDV:       85 ml                         LV ESV:       33 ml                         LV SV:        53 ml RIGHT VENTRICLE RVSP:           18.1 mmHg LEFT ATRIUM             Index       RIGHT ATRIUM           Index LA diam:        3.20 cm 1.77 cm/m  RA Pressure: 3.00 mmHg LA Vol (A2C):   30.0 ml 16.61 ml/m RA Area:     9.79 cm LA Vol (A4C):   27.2 ml 15.06 ml/m RA Volume:   17.80 ml  9.86 ml/m LA Biplane Vol: 28.9 ml 16.00 ml/m   AORTA Ao Root diam: 3.10 cm Ao Asc diam:  2.80 cm MITRAL VALVE               TRICUSPID VALVE MV Area (PHT):             TR Peak grad:   15.1 mmHg MV Decel Time:  TR Vmax:        194.00 cm/s MV E velocity: 77.10 cm/s  Estimated RAP:  3.00 mmHg MV A velocity: 75.40 cm/s  RVSP:           18.1 mmHg MV E/A ratio:  1.02                            SHUNTS                            Systemic Diam: 2.00 cm Loralie Champagne MD Electronically signed by Loralie Champagne MD Signature Date/Time: 09/28/2019/4:25:18 PM    Final      ELIGIBLE FOR AVAILABLE RESEARCH PROTOCOL: no   ASSESSMENT: 64 y.o. [place], [place] woman status post left breast upper outer quadrant biopsy 07/13/2018 for a clinical T1c N0, stage IA invasive ductal carcinoma, grade 3, estrogen and progesterone receptor negative, HER-2 amplified, with an MIB-1 of 70%  (1) status post left mastectomy with sentinel lymph node sampling 09/16/2018 for a pT2, pN0, stage IIA invasive ductal carcinoma, grade 3, with negative margins  (a) a total of 6 sentinel lymph nodes were removed  (b) the patient opted against reconstruction  (c) status post wound debridement 04/01/2019 with benign pathology  (2) adjuvant chemotherapy with paclitaxel and trastuzumab weekly x12 started on 10/21/2018  (a) paclitaxel discontinued after 8 cycles due to peripheral neuropathy.  (3) trastuzumab to be continued every 21 days to total 1 year (through September 2021)  (a) echo 07/27/2018 shows an ejection fraction in the 55-60% range  (b) echo 01/11/2019 shows an ejection fraction in the 55-60% range  (c) echo 05/10/2019 shows an ejection fraction in the 55-60% range  (4) no indication for postmastectomy radiation  (5) genetic testing on 07/22/2018 through the [facility] Common Hereditary cancer panel found no pathogenic mutations.in APC, ATM, AXIN2, BARD1, BMPR1A, BRCA1, BRCA2, BRIP1, CDH1, CDKN2A (p14ARF), CDKN2A (p16INK4a), CKD4, CHEK2, CTNNA1, DICER1, EPCAM (Deletion/duplication testing only), GREM1 (promoter region deletion/duplication testing only), KIT, MEN1, MLH1, MSH2, MSH3, MSH6, MUTYH, NBN, NF1, NHTL1, PALB2, PDGFRA, PMS2, POLD1, POLE, PTEN, RAD50, RAD51C, RAD51D, SDHB, SDHC, SDHD, SMAD4, SMARCA4. STK11, TP53, TSC1, TSC2, and VHL.  The following genes were evaluated for sequence changes only: SDHA and HOXB13 c.251G>A variant only.   PLAN: Oliva is completing her treatments  with Trastuzumab today.  She has done very well with her treatment, and her prognosis is favorable.  Her heart has no signs of any herceptin toxicity and she has been undergoing regular echocardiograms with cardiology.  I congratulated her on her completion.    Her labs are stable and I reviewed those with her in detail.  We discussed next steps for her care.  She should continue to have right breast mammograms annually.  I recommended healthy diet and exercise.  We discussed the role of a survivorship care plan visit in 3 months, and f/u with Dr. Jana Hakim in 6 months.  She understands this.    I sent a message to Celita's surgeon that she can go ahead and have her port removed as well.  We will see her back as above.  She knows to call for any other issue that may develop before her return here.  Total encounter time: 20 minutes*  Wilber Bihari, NP 10/12/19 6:56 AM Medical Oncology and Hematology [facility] [place],  [address] Tel. [phone]    Fax.  [phone]  *Total Encounter Time as defined by the Centers for Medicare and Medicaid Services includes, in addition to the face-to-face time of a patient visit (documented in the note above) non-face-to-face time: obtaining and reviewing outside history, ordering and reviewing medications, tests or procedures, care coordination (communications with other health care professionals or caregivers) and documentation in the medical record.

## 2019-10-07 ENCOUNTER — Telehealth: Payer: Self-pay | Admitting: Adult Health

## 2019-10-07 NOTE — Telephone Encounter (Signed)
Scheduled appts per 9/15 los. Pt confirmed appt dates and times.

## 2019-10-08 ENCOUNTER — Other Ambulatory Visit: Payer: Self-pay | Admitting: Surgery

## 2019-10-15 ENCOUNTER — Ambulatory Visit (INDEPENDENT_AMBULATORY_CARE_PROVIDER_SITE_OTHER): Payer: BC Managed Care – PPO | Admitting: Plastic Surgery

## 2019-10-15 ENCOUNTER — Encounter (HOSPITAL_BASED_OUTPATIENT_CLINIC_OR_DEPARTMENT_OTHER): Payer: Self-pay | Admitting: Surgery

## 2019-10-15 ENCOUNTER — Other Ambulatory Visit: Payer: Self-pay

## 2019-10-15 ENCOUNTER — Encounter: Payer: Self-pay | Admitting: Plastic Surgery

## 2019-10-15 DIAGNOSIS — Z853 Personal history of malignant neoplasm of breast: Secondary | ICD-10-CM

## 2019-10-15 DIAGNOSIS — Z9012 Acquired absence of left breast and nipple: Secondary | ICD-10-CM | POA: Diagnosis not present

## 2019-10-15 DIAGNOSIS — Z171 Estrogen receptor negative status [ER-]: Secondary | ICD-10-CM

## 2019-10-15 DIAGNOSIS — C50412 Malignant neoplasm of upper-outer quadrant of left female breast: Secondary | ICD-10-CM

## 2019-10-15 NOTE — Progress Notes (Signed)
Chart reviewed by Dr. Conrad Maringouin and will proceed with surgery as scheduled.

## 2019-10-15 NOTE — Progress Notes (Signed)
   Subjective:    Patient ID: Dorothy Frederick, female    DOB: 12-Apr-1955, 64 y.o.   MRN: [id number]  Female here for postop follow-up on her left chest breast wound.  She had a left mastectomy for invasive ductal carcinoma a year ago.  She had some breakdown of her skin and difficulty healing the area.  She was seen by asked for wound care.  She was treated with doxycycline and then we did an excision of the wound.  It was approximately 2 x 6 cm capsule was released and we did a layered closure with negative wound pressure therapy.  Used to have done very well for her.  The area is completely healed there is no sign of infection.  She had a little bit of a seroma that we drained 20 cc fluid.  She is very pleased with her progress.      Review of Systems  Constitutional: Negative.   HENT: Negative.   Eyes: Negative.   Respiratory: Negative.   Cardiovascular: Negative.   Genitourinary: Negative.   Hematological: Negative.   Psychiatric/Behavioral: Negative.        Objective:   Physical Exam Vitals reviewed.  Constitutional:      Appearance: Normal appearance.  HENT:     Head: Normocephalic and atraumatic.  Cardiovascular:     Rate and Rhythm: Normal rate.     Pulses: Normal pulses.  Pulmonary:     Effort: Pulmonary effort is normal.  Chest:    Neurological:     General: No focal deficit present.     Mental Status: She is alert and oriented to person, place, and time. Mental status is at baseline.  Psychiatric:        Mood and Affect: Mood normal.        Behavior: Behavior normal.        Thought Content: Thought content normal.        Assessment & Plan:     ICD-10-CM   1. History of breast cancer, left, status post partial mastectomy and XRT  Z85.3   2. Malignant neoplasm of upper-outer quadrant of left breast in female, estrogen receptor negative ([place])  C50.412    Z17.1   3. Acquired absence of left breast  Z90.12     I was able to remove about was 20 cc of serous  fluid from the left breast pocket.  I recommend a compression sleeve to be worn if this continues.  It may be from the lymph aspect.  Physical therapy was offered.  Patient states she does not feel like she wants to do that right now but knows it is a possibility in the future.  I am very happy for her and remain available if she needs [place].  Follow-up as needed.

## 2019-10-16 ENCOUNTER — Inpatient Hospital Stay (HOSPITAL_COMMUNITY): Admission: RE | Admit: 2019-10-16 | Payer: BC Managed Care – PPO | Source: Ambulatory Visit

## 2019-10-18 ENCOUNTER — Other Ambulatory Visit (HOSPITAL_COMMUNITY)
Admission: RE | Admit: 2019-10-18 | Discharge: 2019-10-18 | Disposition: A | Discharge status: Home / Self Care | Payer: BC Managed Care – PPO | Source: Ambulatory Visit | Attending: Surgery | Admitting: Surgery

## 2019-10-18 ENCOUNTER — Encounter (HOSPITAL_BASED_OUTPATIENT_CLINIC_OR_DEPARTMENT_OTHER)
Admission: RE | Admit: 2019-10-18 | Discharge: 2019-10-18 | Disposition: A | Discharge status: Home / Self Care | Payer: BC Managed Care – PPO | Source: Ambulatory Visit | Attending: Surgery | Admitting: Surgery

## 2019-10-18 DIAGNOSIS — Z20822 Contact with and (suspected) exposure to covid-19: Secondary | ICD-10-CM | POA: Diagnosis not present

## 2019-10-18 DIAGNOSIS — Z01812 Encounter for preprocedural laboratory examination: Secondary | ICD-10-CM | POA: Diagnosis not present

## 2019-10-18 LAB — BASIC METABOLIC PANEL
Anion gap: 10 (ref 5–15)
BUN: 16 mg/dL (ref 8–23)
CO2: 25 mmol/L (ref 22–32)
Calcium: 9.4 mg/dL (ref 8.9–10.3)
Chloride: 104 mmol/L (ref 98–111)
Creatinine, Ser: 1.11 mg/dL — ABNORMAL HIGH (ref 0.44–1.00)
GFR calc Af Amer: >60 mL/min (ref >60)
GFR calc non Af Amer: 52 mL/min — ABNORMAL LOW (ref >60)
Glucose, Bld: 89 mg/dL (ref 70–99)
Potassium: 4.6 mmol/L (ref 3.5–5.1)
Sodium: 139 mmol/L (ref 135–145)

## 2019-10-18 LAB — SARS CORONAVIRUS 2 BY RT PCR (HOSPITAL ORDER, PERFORMED IN ~~LOC~~ HOSPITAL LAB): SARS Coronavirus 2: NEGATIVE

## 2019-10-18 NOTE — Progress Notes (Signed)

## 2019-10-19 NOTE — H&P (Signed)
   Dorothy Frederick Appointment: 09/22/2019 10:40 AM Location: [facility] Patient #: [id number] DOB: 1955-07-24 Divorced / Language: Cleophus Molt / Race: White Female   History of Present Illness (Jaylinn Hellenbrand A. Ninfa Linden MD; 09/22/2019 11:00 AM) The patient is a 64 year old female who presents with breast cancer.  Chief complaint: History of left breast cancer  She is status post a left total mastectomy with axillary lymph node biopsy in August 2020 for invasive breast cancer. It was a stage IA grade 3 ER PR negative, HER-2 positive breast cancer. She is still undergoing chemotherapy will be having her last treatment in a couple weeks. She reports she is doing well. She had a scar revision for an open wound by plastic surgery in March of this year and reports things have stayed well healed. She has otherwise been doing very well.   Allergies Sabino Gasser, CMA; 09/22/2019 10:46 AM) Benadryl *ANTIHISTAMINES*  Increased heart rate Sulfa Antibiotics  Rash. Allergies Reconciled   Medication History Sabino Gasser, CMA; 09/22/2019 10:46 AM) Illusions C Breast Prosthesis (6 as directed, Taken starting 09/08/2018) Active. Zantac (300MG  Tablet, Oral) Active. Albuterol (90MCG/ACT Aerosol Soln, Inhalation) Active. Lipitor (10MG  Tablet, Oral) Active. Lexapro (10MG  Tablet, Oral) Active. Cholecalciferol (25 MCG(1000 UT) Capsule, Oral) Active. PriLOSEC (20MG  Capsule DR, Oral) Active. Symbicort (160-4.5MCG/ACT Aerosol, Inhalation) Active. Fosamax (70MG  Tablet, Oral) Active. Synthroid (50MCG Tablet, Oral) Active. Trelegy Ellipta (100-62.5-25MCG/INH Aero Pow Br Act, Inhalation) Active. Famotidine (40MG  Tablet, Oral) Active. Atorvastatin Calcium (20MG  Tablet, Oral) Active. Vitamin D (Oral) Specific strength unknown - Active. Escitalopram Oxalate (Oral) Specific strength unknown - Active. traMADol HCl (50MG  Tablet, Oral) Active. Medications Reconciled  Vitals Sabino Gasser  CMA; 09/22/2019 10:47 AM) 09/22/2019 10:46 AM Weight: 167 lb Height: 64in Body Surface Area: 1.81 m Body Mass Index: 28.67 kg/m  Temp.: 20F (Tympanic)  Pulse: 83 (Regular)  BP: 134/72(Sitting, Left Arm, Standard)       Physical Exam (Emma-Lee Oddo A. Ninfa Linden MD; 09/22/2019 11:01 AM) The physical exam findings are as follows: Note: On exam, the left mastectomy site is well-healed. There are no open wounds. Her old masses. There is no axillary adenopathy or arm swelling.    Assessment & Plan (Jalayah Gutridge A. Ninfa Linden MD; 09/22/2019 11:02 AM)  HISTORY OF LEFT BREAST CANCER (Z85.3)  Impression: I reviewed her notes from electronic medical records including her notes from oncology. She continues to do well. At this point, I will wait on the medical oncologist decision about removing the Port-A-Cath before scheduling this. I discussed Port-A-Cath removal with her in detail. Her breast cancer standpoint, I will see her back in 1 year unless there are problems.  Addendum: port can now come out

## 2019-10-20 ENCOUNTER — Ambulatory Visit (HOSPITAL_BASED_OUTPATIENT_CLINIC_OR_DEPARTMENT_OTHER)
Admission: RE | Admit: 2019-10-20 | Discharge: 2019-10-20 | Disposition: A | Discharge status: Home / Self Care | Payer: BC Managed Care – PPO | Attending: Surgery | Admitting: Surgery

## 2019-10-20 ENCOUNTER — Other Ambulatory Visit: Payer: Self-pay

## 2019-10-20 ENCOUNTER — Ambulatory Visit (HOSPITAL_BASED_OUTPATIENT_CLINIC_OR_DEPARTMENT_OTHER): Payer: BC Managed Care – PPO | Admitting: Certified Registered"

## 2019-10-20 ENCOUNTER — Encounter (HOSPITAL_BASED_OUTPATIENT_CLINIC_OR_DEPARTMENT_OTHER): Payer: Self-pay | Admitting: Surgery

## 2019-10-20 ENCOUNTER — Encounter (HOSPITAL_BASED_OUTPATIENT_CLINIC_OR_DEPARTMENT_OTHER)
Admission: RE | Disposition: A | Discharge status: Home / Self Care | Payer: Self-pay | Source: Home / Self Care | Attending: Surgery

## 2019-10-20 DIAGNOSIS — Z87891 Personal history of nicotine dependence: Secondary | ICD-10-CM | POA: Diagnosis not present

## 2019-10-20 DIAGNOSIS — E039 Hypothyroidism, unspecified: Secondary | ICD-10-CM | POA: Insufficient documentation

## 2019-10-20 DIAGNOSIS — Z882 Allergy status to sulfonamides status: Secondary | ICD-10-CM | POA: Insufficient documentation

## 2019-10-20 DIAGNOSIS — Z7951 Long term (current) use of inhaled steroids: Secondary | ICD-10-CM | POA: Insufficient documentation

## 2019-10-20 DIAGNOSIS — Z79890 Hormone replacement therapy (postmenopausal): Secondary | ICD-10-CM | POA: Insufficient documentation

## 2019-10-20 DIAGNOSIS — Z7983 Long term (current) use of bisphosphonates: Secondary | ICD-10-CM | POA: Diagnosis not present

## 2019-10-20 DIAGNOSIS — K219 Gastro-esophageal reflux disease without esophagitis: Secondary | ICD-10-CM | POA: Insufficient documentation

## 2019-10-20 DIAGNOSIS — Z452 Encounter for adjustment and management of vascular access device: Secondary | ICD-10-CM | POA: Diagnosis not present

## 2019-10-20 DIAGNOSIS — Z9221 Personal history of antineoplastic chemotherapy: Secondary | ICD-10-CM | POA: Diagnosis not present

## 2019-10-20 DIAGNOSIS — Z9882 Breast implant status: Secondary | ICD-10-CM | POA: Insufficient documentation

## 2019-10-20 DIAGNOSIS — Z171 Estrogen receptor negative status [ER-]: Secondary | ICD-10-CM | POA: Insufficient documentation

## 2019-10-20 DIAGNOSIS — E785 Hyperlipidemia, unspecified: Secondary | ICD-10-CM | POA: Diagnosis not present

## 2019-10-20 DIAGNOSIS — Z888 Allergy status to other drugs, medicaments and biological substances status: Secondary | ICD-10-CM | POA: Insufficient documentation

## 2019-10-20 DIAGNOSIS — I10 Essential (primary) hypertension: Secondary | ICD-10-CM | POA: Insufficient documentation

## 2019-10-20 DIAGNOSIS — Z9012 Acquired absence of left breast and nipple: Secondary | ICD-10-CM | POA: Diagnosis not present

## 2019-10-20 DIAGNOSIS — C50912 Malignant neoplasm of unspecified site of left female breast: Secondary | ICD-10-CM | POA: Diagnosis not present

## 2019-10-20 DIAGNOSIS — I2510 Atherosclerotic heart disease of native coronary artery without angina pectoris: Secondary | ICD-10-CM | POA: Insufficient documentation

## 2019-10-20 DIAGNOSIS — J449 Chronic obstructive pulmonary disease, unspecified: Secondary | ICD-10-CM | POA: Diagnosis not present

## 2019-10-20 DIAGNOSIS — Z853 Personal history of malignant neoplasm of breast: Secondary | ICD-10-CM | POA: Diagnosis not present

## 2019-10-20 DIAGNOSIS — Z79899 Other long term (current) drug therapy: Secondary | ICD-10-CM | POA: Diagnosis not present

## 2019-10-20 HISTORY — PX: PORT-A-CATH REMOVAL: SHX5289

## 2019-10-20 SURGERY — REMOVAL PORT-A-CATH
Anesthesia: Monitor Anesthesia Care | Site: Chest | Laterality: Right

## 2019-10-20 MED ORDER — PROPOFOL 500 MG/50ML IV EMUL
INTRAVENOUS | Status: DC | PRN
  Administered 2019-10-20: 100 ug/kg/min via INTRAVENOUS

## 2019-10-20 MED ORDER — OXYCODONE HCL 5 MG/5ML PO SOLN: 5.0000 mg | Freq: Once | ORAL | Status: DC | PRN

## 2019-10-20 MED ORDER — FENTANYL CITRATE (PF) 100 MCG/2ML IJ SOLN
INTRAMUSCULAR | Status: AC
  Filled 2019-10-20: qty 2

## 2019-10-20 MED ORDER — ACETAMINOPHEN 500 MG PO TABS
ORAL_TABLET | ORAL | Status: AC
  Filled 2019-10-20: qty 2

## 2019-10-20 MED ORDER — MEPERIDINE HCL 25 MG/ML IJ SOLN: 6.2500 mg | INTRAMUSCULAR | Status: DC | PRN

## 2019-10-20 MED ORDER — ONDANSETRON HCL 4 MG/2ML IJ SOLN
INTRAMUSCULAR | Status: AC
  Filled 2019-10-20: qty 2

## 2019-10-20 MED ORDER — CEFAZOLIN SODIUM-DEXTROSE 2-4 GM/100ML-% IV SOLN
INTRAVENOUS | Status: AC
  Filled 2019-10-20: qty 100

## 2019-10-20 MED ORDER — LACTATED RINGERS IV SOLN: INTRAVENOUS | Status: DC

## 2019-10-20 MED ORDER — CEFAZOLIN SODIUM-DEXTROSE 2-4 GM/100ML-% IV SOLN
2.0000 g | INTRAVENOUS | Status: AC
  Administered 2019-10-20: 2 g via INTRAVENOUS

## 2019-10-20 MED ORDER — ONDANSETRON HCL 4 MG/2ML IJ SOLN: 4.0000 mg | Freq: Once | INTRAMUSCULAR | Status: DC | PRN

## 2019-10-20 MED ORDER — TRAMADOL HCL 50 MG PO TABS: 50.0000 mg | ORAL_TABLET | Freq: Four times a day (QID) | ORAL | 0 refills | Status: DC | PRN

## 2019-10-20 MED ORDER — CHLORHEXIDINE GLUCONATE CLOTH 2 % EX PADS: 6.0000 | MEDICATED_PAD | Freq: Once | CUTANEOUS | Status: DC

## 2019-10-20 MED ORDER — FENTANYL CITRATE (PF) 100 MCG/2ML IJ SOLN
INTRAMUSCULAR | Status: DC | PRN
Start: 2019-10-20 — End: 2019-10-20
  Administered 2019-10-20: 25 ug via INTRAVENOUS

## 2019-10-20 MED ORDER — LIDOCAINE-EPINEPHRINE 1 %-1:100000 IJ SOLN
INTRAMUSCULAR | Status: DC | PRN
  Administered 2019-10-20: 5 mL via INTRADERMAL

## 2019-10-20 MED ORDER — MIDAZOLAM HCL 2 MG/2ML IJ SOLN
INTRAMUSCULAR | Status: AC
  Filled 2019-10-20: qty 2

## 2019-10-20 MED ORDER — OXYCODONE HCL 5 MG PO TABS: 5.0000 mg | ORAL_TABLET | Freq: Once | ORAL | Status: DC | PRN

## 2019-10-20 MED ORDER — ACETAMINOPHEN 500 MG PO TABS
1000.0000 mg | ORAL_TABLET | ORAL | Status: AC
  Administered 2019-10-20: 1000 mg via ORAL

## 2019-10-20 MED ORDER — ACETAMINOPHEN 160 MG/5ML PO SOLN: 325.0000 mg | ORAL | Status: DC | PRN

## 2019-10-20 MED ORDER — ACETAMINOPHEN 325 MG PO TABS: 325.0000 mg | ORAL_TABLET | ORAL | Status: DC | PRN

## 2019-10-20 MED ORDER — LIDOCAINE 2% (20 MG/ML) 5 ML SYRINGE
INTRAMUSCULAR | Status: AC
  Filled 2019-10-20: qty 5

## 2019-10-20 MED ORDER — MIDAZOLAM HCL 2 MG/2ML IJ SOLN
INTRAMUSCULAR | Status: DC | PRN
  Administered 2019-10-20: 2 mg via INTRAVENOUS

## 2019-10-20 MED ORDER — ONDANSETRON HCL 4 MG/2ML IJ SOLN
INTRAMUSCULAR | Status: DC | PRN
  Administered 2019-10-20: 4 mg via INTRAVENOUS

## 2019-10-20 MED ORDER — FENTANYL CITRATE (PF) 100 MCG/2ML IJ SOLN: 25.0000 ug | INTRAMUSCULAR | Status: DC | PRN

## 2019-10-20 MED ORDER — LIDOCAINE HCL (CARDIAC) PF 100 MG/5ML IV SOSY
PREFILLED_SYRINGE | INTRAVENOUS | Status: DC | PRN
  Administered 2019-10-20: 40 mg via INTRAVENOUS

## 2019-10-20 MED ORDER — PROPOFOL 10 MG/ML IV BOLUS
INTRAVENOUS | Status: AC
  Filled 2019-10-20: qty 20

## 2019-10-20 SURGICAL SUPPLY — 29 items
BLADE SURG 15 STRL LF DISP TIS (BLADE) ×1 IMPLANT
BLADE SURG 15 STRL SS (BLADE) ×1
CHLORAPREP W/TINT 26 (MISCELLANEOUS) ×2 IMPLANT
COVER BACK TABLE 60X90IN (DRAPES) ×2 IMPLANT
COVER MAYO STAND STRL (DRAPES) ×2 IMPLANT
COVER WAND RF STERILE (DRAPES) IMPLANT
DECANTER SPIKE VIAL GLASS SM (MISCELLANEOUS) IMPLANT
DERMABOND ADVANCED (GAUZE/BANDAGES/DRESSINGS) ×1
DERMABOND ADVANCED .7 DNX12 (GAUZE/BANDAGES/DRESSINGS) ×1 IMPLANT
DRAPE LAPAROTOMY 100X72 PEDS (DRAPES) ×2 IMPLANT
DRAPE UTILITY XL STRL (DRAPES) ×2 IMPLANT
ELECT REM PT RETURN 9FT ADLT (ELECTROSURGICAL) ×2
ELECTRODE REM PT RTRN 9FT ADLT (ELECTROSURGICAL) ×1 IMPLANT
GLOVE SURG SIGNA 7.5 PF LTX (GLOVE) ×2 IMPLANT
GOWN STRL REUS W/ TWL LRG LVL3 (GOWN DISPOSABLE) ×1 IMPLANT
GOWN STRL REUS W/ TWL XL LVL3 (GOWN DISPOSABLE) ×1 IMPLANT
GOWN STRL REUS W/TWL LRG LVL3 (GOWN DISPOSABLE) ×1
GOWN STRL REUS W/TWL XL LVL3 (GOWN DISPOSABLE) ×1
NEEDLE HYPO 25X1 1.5 SAFETY (NEEDLE) ×2 IMPLANT
NS IRRIG 1000ML POUR BTL (IV SOLUTION) IMPLANT
PACK BASIN DAY SURGERY FS (CUSTOM PROCEDURE TRAY) ×2 IMPLANT
PENCIL SMOKE EVACUATOR (MISCELLANEOUS) ×2 IMPLANT
SLEEVE SCD COMPRESS KNEE MED (MISCELLANEOUS) IMPLANT
SUT MNCRL AB 4-0 PS2 18 (SUTURE) ×2 IMPLANT
SUT VIC AB 3-0 SH 27 (SUTURE) ×1
SUT VIC AB 3-0 SH 27X BRD (SUTURE) ×1 IMPLANT
SYR BULB EAR ULCER 3OZ GRN STR (SYRINGE) IMPLANT
SYR CONTROL 10ML LL (SYRINGE) ×2 IMPLANT
TOWEL GREEN STERILE FF (TOWEL DISPOSABLE) ×2 IMPLANT

## 2019-10-20 NOTE — Transfer of Care (Signed)
Immediate Anesthesia Transfer of Care Note  Patient: Dorothy Frederick  Procedure(s) Performed: REMOVAL PORT-A-CATH (Right Chest)  Patient Location: PACU  Anesthesia Type:MAC  Level of Consciousness: awake, alert  and oriented  Airway & Oxygen Therapy: Patient Spontanous Breathing and Patient connected to face mask oxygen  Post-op Assessment: Report given to RN and Post -op Vital signs reviewed and stable  Post vital signs: Reviewed and stable  Last Vitals:  Vitals Value Taken Time  BP 121/54 10/20/19 1612  Temp    Pulse 58 10/20/19 1613  Resp 21 10/20/19 1613  SpO2 100 % 10/20/19 1613  Vitals shown include unvalidated device data.  Last Pain:  Vitals:   10/20/19 1346  TempSrc: Oral  PainSc: 0-No pain         Complications: No complications documented.

## 2019-10-20 NOTE — Op Note (Signed)
REMOVAL PORT-A-CATH  Procedure Note  Delise Simenson 10/20/2019   Pre-op Diagnosis: HISTORY OF BREAST CANCER     Post-op Diagnosis: same  Procedure(s): REMOVAL PORT-A-CATH  Surgeon(s): Coralie Keens, MD  Anesthesia: Monitor Anesthesia Care  Staff:  Circulator: Carson Myrtle, RN Scrub Person: Burna Sis, RN  Estimated Blood Loss: Minimal               Indications: This is a 64 year old female who has completed her treatment for breast cancer.  The Port-A-Cath is no longer needed so will be removed  Procedure: Patient brought to the operating room identifies correct patient.  She is placed upon the operating table and anesthesia was induced.  Her right chest was then prepped and draped in usual sterile fashion.  I anesthetized the skin at the Port-A-Cath site with lidocaine and then made a small incision with scalpel through the previous scar.  I then dissected down to the port which was easily identified.  I excised the previously placed sutures holding the port in place and then was able to easily remove the port and catheter its entirety.  Hemostasis appeared to be achieved.  I then closed subtenons tissue with interrupted 3-0 Vicryl sutures and closed skin with a running 4-0 Monocryl.  Dermabond was then applied.  The patient tolerated the procedure well.  All the counts were correct at the end of the procedure.  The patient was then taken in stable addition to the recovery room.          Coralie Keens   Date: 10/20/2019  Time: 4:05 PM

## 2019-10-20 NOTE — Interval H&P Note (Signed)
History and Physical Interval Note: no change in H and P  10/20/2019 2:20 PM  Dorothy Frederick  has presented today for surgery, with the diagnosis of HISTORY OF BREAST CANCER.  The various methods of treatment have been discussed with the patient and family. After consideration of risks, benefits and other options for treatment, the patient has consented to  Procedure(s): REMOVAL PORT-A-CATH (N/A) as a surgical intervention.  The patient's history has been reviewed, patient examined, no change in status, stable for surgery.  I have reviewed the patient's chart and labs.  Questions were answered to the patient's satisfaction.     Coralie Keens

## 2019-10-20 NOTE — Anesthesia Postprocedure Evaluation (Signed)
Anesthesia Post Note  Patient: Kinslea Frances  Procedure(s) Performed: REMOVAL PORT-A-CATH (Right Chest)     Patient location during evaluation: PACU Anesthesia Type: MAC Level of consciousness: awake and alert Pain management: pain level controlled Vital Signs Assessment: post-procedure vital signs reviewed and stable Respiratory status: spontaneous breathing, nonlabored ventilation, respiratory function stable and patient connected to nasal cannula oxygen Cardiovascular status: stable and blood pressure returned to baseline Postop Assessment: no apparent nausea or vomiting Anesthetic complications: no   No complications documented.  Last Vitals:  Vitals:   10/20/19 1658 10/20/19 1721  BP:  (!) 140/54  Pulse: (!) 52 (!) 53  Resp: 18 20  Temp:  (!) 36.2 C  SpO2: 99% 95%    Last Pain:  Vitals:   10/20/19 1721  TempSrc: Oral  PainSc: 0-No pain                 Sarayu Prevost

## 2019-10-20 NOTE — Anesthesia Preprocedure Evaluation (Addendum)
Anesthesia Evaluation  Patient identified by MRN, date of birth, ID band Patient awake    Reviewed: Allergy & Precautions, NPO status , Patient's Chart, lab work & pertinent test results  Airway Mallampati: II  TM Distance: >3 FB Neck ROM: Full    Dental  (+) Loose, Missing, Caps,    Pulmonary COPD,  COPD inhaler, former smoker,    Pulmonary exam normal breath sounds clear to auscultation       Cardiovascular hypertension, + CAD  Normal cardiovascular exam Rhythm:Regular Rate:Normal  ECG: NSR, rate 86  ECHO: 1. Left ventricular ejection fraction, by visual estimation, is 55 to 60%. The left ventricle has normal function. There is mildly increased left ventricular hypertrophy. 2. The average left ventricular global longitudinal strain is -18.5 %. 3. Left ventricular diastolic parameters are consistent with Grade I diastolic dysfunction (impaired relaxation). 4. The left ventricle has no regional wall motion abnormalities. 5. Global right ventricle has normal systolic function.The right ventricular size is normal. No increase in right ventricular wall thickness. 6. Left atrial size was normal. 7. Right atrial size was normal. 8. The mitral valve is normal in structure. Trivial mitral valve regurgitation. No evidence of mitral stenosis. 9. The tricuspid valve is normal in structure. Tricuspid valve regurgitation is trivial. 10. The aortic valve is normal in structure. Aortic valve regurgitation is not visualized. No evidence of aortic valve sclerosis or stenosis. 11. The pulmonic valve was normal in structure. Pulmonic valve regurgitation is trivial. 12. The inferior vena cava is normal in size with greater than 50% respiratory variability, suggesting right atrial pressure of 3 mmHg. 13. TR signal is inadequate for assessing pulmonary artery systolic pressure.   Neuro/Psych PSYCHIATRIC DISORDERS Anxiety Depression negative  neurological ROS     GI/Hepatic Neg liver ROS, GERD  Medicated and Controlled,  Endo/Other  Hypothyroidism   Renal/GU negative Renal ROS     Musculoskeletal negative musculoskeletal ROS (+)   Abdominal   Peds  Hematology HLD   Anesthesia Other Findings left breast wound  Reproductive/Obstetrics                             Anesthesia Physical  Anesthesia Plan  ASA: III  Anesthesia Plan: MAC   Post-op Pain Management:    Induction: Intravenous  PONV Risk Score and Plan: 3 and Treatment may vary due to age or medical condition  Airway Management Planned: Nasal Cannula, Simple Face Mask, Natural Airway and Mask  Additional Equipment: None  Intra-op Plan:   Post-operative Plan:   Informed Consent: I have reviewed the patients History and Physical, chart, labs and discussed the procedure including the risks, benefits and alternatives for the proposed anesthesia with the patient or authorized representative who has indicated his/her understanding and acceptance.     Dental advisory given  Plan Discussed with: CRNA and Anesthesiologist  Anesthesia Plan Comments:        Anesthesia Quick Evaluation

## 2019-10-20 NOTE — Discharge Instructions (Signed)
You may shower starting tomorrow  Ice pack, tylenol, and ibuprofen for pain  No vigorous activity for one week   Post Anesthesia Home Care Instructions  Activity: Get plenty of rest for the remainder of the day. A responsible individual must stay with you for 24 hours following the procedure.  For the next 24 hours, DO NOT: -Drive a car -Paediatric nurse -Drink alcoholic beverages -Take any medication unless instructed by your physician -Make any legal decisions or sign important papers.  Meals: Start with liquid foods such as gelatin or soup. Progress to regular foods as tolerated. Avoid greasy, spicy, heavy foods. If nausea and/or vomiting occur, drink only clear liquids until the nausea and/or vomiting subsides. Call your physician if vomiting continues.  Special Instructions/Symptoms: Your throat may feel dry or sore from the anesthesia or the breathing tube placed in your throat during surgery. If this causes discomfort, gargle with warm salt water. The discomfort should disappear within 24 hours.  If you had a scopolamine patch placed behind your ear for the management of post- operative nausea and/or vomiting:  1. The medication in the patch is effective for 72 hours, after which it should be removed.  Wrap patch in a tissue and discard in the trash. Wash hands thoroughly with soap and water. 2. You may remove the patch earlier than 72 hours if you experience unpleasant side effects which may include dry mouth, dizziness or visual disturbances. 3. Avoid touching the patch. Wash your hands with soap and water after contact with the patch.

## 2019-10-21 ENCOUNTER — Encounter (HOSPITAL_BASED_OUTPATIENT_CLINIC_OR_DEPARTMENT_OTHER): Payer: Self-pay | Admitting: Surgery

## 2019-11-03 DIAGNOSIS — Z23 Encounter for immunization: Secondary | ICD-10-CM | POA: Diagnosis not present

## 2019-11-16 ENCOUNTER — Other Ambulatory Visit: Payer: Self-pay

## 2019-11-16 ENCOUNTER — Encounter: Payer: Self-pay | Admitting: Cardiovascular Disease

## 2019-11-16 ENCOUNTER — Ambulatory Visit (INDEPENDENT_AMBULATORY_CARE_PROVIDER_SITE_OTHER): Payer: BC Managed Care – PPO | Admitting: Cardiovascular Disease

## 2019-11-16 VITALS — BP 114/70 | HR 67 | Ht 63.5 in | Wt 170.6 lb

## 2019-11-16 DIAGNOSIS — I2510 Atherosclerotic heart disease of native coronary artery without angina pectoris: Secondary | ICD-10-CM

## 2019-11-16 DIAGNOSIS — I158 Other secondary hypertension: Secondary | ICD-10-CM | POA: Diagnosis not present

## 2019-11-16 DIAGNOSIS — E7800 Pure hypercholesterolemia, unspecified: Secondary | ICD-10-CM

## 2019-11-16 DIAGNOSIS — I2584 Coronary atherosclerosis due to calcified coronary lesion: Secondary | ICD-10-CM

## 2019-11-16 NOTE — Progress Notes (Signed)
Cardiology Office Note   Date:  11/16/2019   ID:  Dorothy Frederick, DOB [birth year], MRN [id number]  PCP:  [name], MD  Cardiologist:   Skeet Latch, MD   No chief complaint on file.   History of Present Illness: Dorothy Frederick is a 64 y.o. female with hyperlipidemia, prior tobacco abuse, COPD, and breast cancer who presents for follow up.  She first saw Dr. Loletha Grayer in 2016 for shortness of breath.  At Dorothy time she was noted to have an abnormal EKG.  She was referred for an echo and a stress test.  Echocardiogram 09/2013 revealed LVEF 55 to 60% and was otherwise unremarkable.  Nuclear stress test was concerning for LAD ischemia.  She underwent cardiac catheterization and was found to have mild proximal LAD stenosis and a possible myocardial bridge in Dorothy distal LAD and 220% OM stenosis.  She was seen in Dorothy office by Doreene Adas, PA-C on 09/2017 for preoperative risk assessment.  She was felt to be safe for surgery.  She was again seen for surgical care in 07/2018 in Dorothy setting of recurrent breast cancer requiring mastectomy.    She had no cardiac concerns and was deemed to be at acceptable risk for surgery.  Dorothy Frederick had an echo 07/2019 revealed LVEF 55 to 60% with grade 1 diastolic dysfunction.  Right atrial pressure was 3 mmHg.  GLS was not reported.  This was unchanged from prior. At her last appointment Dorothy Frederick reported edema and shortness of breath.  She had an echo 09/2019 that revealed LVEF 55 to 60% with grade 1 diastolic dysfunction.  Right atrial pressure was 3 mmHg.  She was given Lasix 20 mg to take as needed. Since that appointment she completed her chemotherapy 09/2019.  She had her Port-A-Cath removed 10/20/2019.  She was on paclitaxel as well until 09/2018 when it was discontinued for peripheral neuropathy.  She ws previously treated with Tamoxifen for 1.5 years.  This was stopped due to financial difficulty.  While on chemotherapy her blood pressure was more elevated than  usual.  She notes that she has been feeling a lot better.  She has used lasix twice and it helped.  Her ankles were swelling and she gained a couple pounds so she took lasix and Dorothy symptoms resolved.  She is started to try walking again.  She gets tired but otherwise feels well.  She has no chest pain or pressure.  She has not noted any lower extremity edema, orthopnea, or PND.   Past Medical History:  Diagnosis Date  . Angina at rest [facility])   . Anxiety   . Cancer [facility]) 2000   left breast cancer-partial mastectomy  . COPD (chronic obstructive pulmonary disease) ([place])   . Depression   . Family history of breast cancer   . Family history of esophageal cancer   . Family history of lung cancer   . Family history of prostate cancer   . GERD (gastroesophageal reflux disease)   . Hyperlipidemia   . Hypertension   . Hypothyroidism   . Recurrent breast cancer, left [facility])     Past Surgical History:  Procedure Laterality Date  . ADENOIDECTOMY    . APPLICATION OF WOUND VAC Left 04/01/2019   Procedure: Placement of provena wound vac;  Surgeon: Wallace Going, DO;  Location: [place];  Service: Plastics;  Laterality: Left;  . CARDIAC CATHETERIZATION    . DEBRIDEMENT AND CLOSURE WOUND Left 04/01/2019  Procedure: Left breast wound excision and closure;  Surgeon: Wallace Going, DO;  Location: [place];  Service: Plastics;  Laterality: Left;  . LEFT HEART CATHETERIZATION WITH CORONARY ANGIOGRAM N/A 10/21/2013   Procedure: LEFT HEART CATHETERIZATION WITH CORONARY ANGIOGRAM;  Surgeon: Burnell Blanks, MD;  Location: [facility] CATH LAB;  Service: Cardiovascular;  Laterality: N/A;  . MASTECTOMY W/ SENTINEL NODE BIOPSY Left 09/16/2018   Procedure: LEFT TOTAL MASTECTOMY WITH LEFT AXILLARY DEEP SENTINEL LYMPH NODE BIOPSY AND INJECT BLUE DYE;  Surgeon: Fanny Skates, MD;  Location: [place];  Service: General;  Laterality: Left;  Marland Kitchen MASTECTOMY,  PARTIAL Left 2000  . PORT-A-CATH REMOVAL Right 10/20/2019   Procedure: REMOVAL PORT-A-CATH;  Surgeon: Coralie Keens, MD;  Location: [place];  Service: General;  Laterality: Right;  . PORTACATH PLACEMENT Right 09/16/2018   Procedure: INSERTION PORT-A-CATH;  Surgeon: Fanny Skates, MD;  Location: [place];  Service: General;  Laterality: Right;  . TONSILLECTOMY       Current Outpatient Medications  Medication Sig Dispense Refill  . albuterol (PROVENTIL HFA;VENTOLIN HFA) 108 (90 BASE) MCG/ACT inhaler Inhale 2 puffs into Dorothy lungs every 6 (six) hours as needed for wheezing or shortness of breath.    Marland Kitchen atorvastatin (LIPITOR) 20 MG tablet Take 20 mg by mouth at bedtime.    . Cholecalciferol 1000 UNITS capsule Take 500 Units by mouth daily.     Marland Kitchen escitalopram (LEXAPRO) 10 MG tablet Take 10 mg by mouth daily.  3  . Fluticasone-Umeclidin-Vilant (TRELEGY ELLIPTA) 100-62.5-25 MCG/INH AEPB Inhale 1 puff into Dorothy lungs daily as needed.    . furosemide (LASIX) 20 MG tablet TAKE ONE TABLET AS NEEDED FOR WEIGHT GAIN OF 2 POUNDS IN 24 HOURS OR 5 POUNDS IN 7 DAYS 30 tablet 1  . levothyroxine (SYNTHROID) 75 MCG tablet Take by mouth.    . levothyroxine (SYNTHROID, LEVOTHROID) 50 MCG tablet Take 1 tablet by mouth every other day. Alternate 50 mcg and 75 mcg every other day    . olmesartan (BENICAR) 20 MG tablet Take 20 mg by mouth daily.     No current facility-administered medications for this visit.   Facility-Administered Medications Ordered in Other Visits  Medication Dose Route Frequency Provider Last Rate Last Admin  . 0.9 %  sodium chloride infusion   Intravenous Continuous Gardenia Phlegm, NP   Stopped at 12/24/18 1548    Allergies:   Benadryl [diphenhydramine hcl (sleep)] and Sulfa antibiotics    Social History:  Dorothy Frederick  reports that she has quit smoking. Her smoking use included cigarettes. She has a 38.00 pack-year smoking history. She has  never used smokeless tobacco. She reports that she does not drink alcohol and does not use drugs.   Family History:  Dorothy Frederick's family history includes Breast cancer in her maternal aunt and sister; Cancer in her father; Esophageal cancer in her maternal aunt; Heart disease in her mother and sister; Hyperlipidemia in her maternal grandmother; Hypertension in her brother, brother, father, and sister; Lung cancer in her maternal aunt; Prostate cancer in her father; Stomach cancer in her paternal grandmother; Stroke in her maternal grandmother.    ROS:  Please see Dorothy history of present illness.   Otherwise, review of systems are positive for none.   All other systems are reviewed and negative.    PHYSICAL EXAM: VS:  BP 114/70   Pulse 67   Ht 5' 3.5" (1.613 m)   Wt 170 lb  9.6 oz (77.4 kg)   SpO2 98%   BMI 29.75 kg/m  , BMI Body mass index is 29.75 kg/m. GENERAL:  Well appearing HEENT: Pupils equal round and reactive, fundi not visualized, oral mucosa unremarkable NECK:  No jugular venous distention, waveform within normal limits, carotid upstroke brisk and symmetric, no bruits LUNGS:  Clear to auscultation bilaterally HEART:  RRR.  PMI not displaced or sustained,S1 and S2 within normal limits, no S3, no S4, no clicks, no rubs, no murmurs ABD:  Flat, positive bowel sounds normal in frequency in pitch, no bruits, no rebound, no guarding, no midline pulsatile mass, no hepatomegaly, no splenomegaly EXT:  2 plus pulses throughout, no edema, no cyanosis no clubbing SKIN:  No rashes no nodules NEURO:  Cranial nerves II through XII grossly intact, motor grossly intact throughout PSYCH:  Cognitively intact, oriented to person place and time  EKG:  EKG is not ordered today. Dorothy ekg ordered 09/15/19 demonstrates sinus rhythm.  Rate 70 bpm.  Cannot rule out prior inferior infarct.  Echo 09/28/19:  1. Left ventricular ejection fraction, by estimation, is 55 to 60%. Dorothy  left ventricle has normal  function. Dorothy left ventricle has no regional  wall motion abnormalities. Left ventricular diastolic parameters are  consistent with Grade I diastolic  dysfunction (impaired relaxation). GLS -18.6%, within normal range.  2. Right ventricular systolic function is normal. Dorothy right ventricular  size is normal. There is normal pulmonary artery systolic pressure. Dorothy  estimated right ventricular systolic pressure is 24.0 mmHg.  3. Dorothy mitral valve is normal in structure. Trivial mitral valve  regurgitation. No evidence of mitral stenosis.  4. Dorothy aortic valve is tricuspid. Aortic valve regurgitation is not  visualized. No aortic stenosis is present.  5. Dorothy inferior vena cava is normal in size with greater than 50%  respiratory variability, suggesting right atrial pressure of 3 mmHg.   Recent Labs: 10/06/2019: ALT 23; Hemoglobin 12.0; Platelets 266 10/18/2019: BUN 16; Creatinine, Ser 1.11; Potassium 4.6; Sodium 139    Lipid Panel No results found for: CHOL, TRIG, HDL, CHOLHDL, VLDL, LDLCALC, LDLDIRECT    Wt Readings from Last 3 Encounters:  11/16/19 170 lb 9.6 oz (77.4 kg)  10/20/19 167 lb 8.8 oz (76 kg)  10/06/19 168 lb 4.8 oz (76.3 kg)     ASSESSMENT AND PLAN:  # Chronic diastolic heart failure: At her last appointment Dorothy Frederick had some mild lower extremity edema and shortness of breath.  She had an echo that showed normal systolic function with grade 1 diastolic dysfunction.  Volume status appears stable.  She was given some Lasix to take as needed and this was helpful.  She has only needed to use it a couple times.    # Essential hypertension:  Blood pressure is well-controlled.  Continue olmesartan.  # Non-obstructive CAD: # Hyperlipidemia: Mild LAD and OM disease on cath previously.  She is asymptomatic.  Continue atorvastatin.  Lipids were recently checked with PCP.  Her LDL goal should be less than 70.   Current medicines are reviewed at length with Dorothy Frederick  today.  Dorothy Frederick does not have concerns regarding medicines.  Dorothy following changes have been made:  Lasix prn  Labs/ tests ordered today include:   No orders of Dorothy defined types were placed in this encounter.    Disposition:   FU with Ronelle Smallman C. Oval Linsey, MD, [facility] in 1 year    Signed, Rosalina Dingwall C. Oval Linsey, MD, [facility]  11/16/2019 1:12 PM  [facility]

## 2019-11-16 NOTE — Patient Instructions (Signed)
Medication Instructions:  Your physician recommends that you continue on your current medications as directed. Please refer to the Current Medication list given to you today.   *If you need a refill on your cardiac medications before your next appointment, please call your pharmacy*  Lab Work: NONE   Testing/Procedures: NONE  Follow-Up: At [facility], you and your health needs are our priority.  As part of our continuing mission to provide you with exceptional heart care, we have created designated Provider Care Teams.  These Care Teams include your primary Cardiologist (physician) and Advanced Practice Providers (APPs -  Physician Assistants and Nurse Practitioners) who all work together to provide you with the care you need, when you need it.  We recommend signing up for the patient portal called "MyChart".  Sign up information is provided on this After Visit Summary.  MyChart is used to connect with patients for Virtual Visits (Telemedicine).  Patients are able to view lab/test results, encounter notes, upcoming appointments, etc.  Non-urgent messages can be sent to your provider as well.   To learn more about what you can do with MyChart, go to [email or web address]://www.mychart.com.    Your next appointment:   12 month(s) You will receive a reminder letter in the mail two months in advance. If you don't receive a letter, please call our office to schedule the follow-up appointment.   The format for your next appointment:   In Person  Provider:   You may see Tiffany [place], MD or one of the following Advanced Practice Providers on your designated Care Team:    Luke Kilroy, PA-C  Callie Goodrich, PA-C  Jesse Cleaver, FNP    

## 2019-11-17 ENCOUNTER — Other Ambulatory Visit: Payer: Self-pay | Admitting: Cardiovascular Disease

## 2019-12-07 DIAGNOSIS — J309 Allergic rhinitis, unspecified: Secondary | ICD-10-CM | POA: Diagnosis not present

## 2019-12-07 DIAGNOSIS — J441 Chronic obstructive pulmonary disease with (acute) exacerbation: Secondary | ICD-10-CM | POA: Diagnosis not present

## 2019-12-07 DIAGNOSIS — J449 Chronic obstructive pulmonary disease, unspecified: Secondary | ICD-10-CM | POA: Diagnosis not present

## 2019-12-07 DIAGNOSIS — C50919 Malignant neoplasm of unspecified site of unspecified female breast: Secondary | ICD-10-CM | POA: Diagnosis not present

## 2019-12-28 DIAGNOSIS — R5383 Other fatigue: Secondary | ICD-10-CM | POA: Diagnosis not present

## 2019-12-28 DIAGNOSIS — C50919 Malignant neoplasm of unspecified site of unspecified female breast: Secondary | ICD-10-CM | POA: Diagnosis not present

## 2019-12-28 DIAGNOSIS — J189 Pneumonia, unspecified organism: Secondary | ICD-10-CM | POA: Diagnosis not present

## 2019-12-28 DIAGNOSIS — J441 Chronic obstructive pulmonary disease with (acute) exacerbation: Secondary | ICD-10-CM | POA: Diagnosis not present

## 2019-12-30 DIAGNOSIS — J441 Chronic obstructive pulmonary disease with (acute) exacerbation: Secondary | ICD-10-CM | POA: Diagnosis not present

## 2019-12-30 DIAGNOSIS — J449 Chronic obstructive pulmonary disease, unspecified: Secondary | ICD-10-CM | POA: Diagnosis not present

## 2019-12-30 DIAGNOSIS — J189 Pneumonia, unspecified organism: Secondary | ICD-10-CM | POA: Diagnosis not present

## 2020-01-06 DIAGNOSIS — J449 Chronic obstructive pulmonary disease, unspecified: Secondary | ICD-10-CM | POA: Diagnosis not present

## 2020-01-18 NOTE — Progress Notes (Signed)
SURVIVORSHIP VISIT:    BRIEF ONCOLOGIC HISTORY:  Oncology History  Malignant neoplasm of upper-outer quadrant of left breast in female, estrogen receptor negative ([place])  07/15/2018 Initial Diagnosis   Malignant neoplasm of upper-outer quadrant of left breast in female, estrogen receptor negative ([place])   07/29/2018 Genetic Testing   Genetic testing reported out on 07/29/2018 through the [facility] Common Hereditary cancer panel found no pathogenic mutations.   The Common Hereditary Cancers Panel offered by [facility] includes sequencing and/or deletion duplication testing of the following 47 genes: APC, ATM, AXIN2, BARD1, BMPR1A, BRCA1, BRCA2, BRIP1, CDH1, CDKN2A (p14ARF), CDKN2A (p16INK4a), CKD4, CHEK2, CTNNA1, DICER1, EPCAM (Deletion/duplication testing only), GREM1 (promoter region deletion/duplication testing only), KIT, MEN1, MLH1, MSH2, MSH3, MSH6, MUTYH, NBN, NF1, NHTL1, PALB2, PDGFRA, PMS2, POLD1, POLE, PTEN, RAD50, RAD51C, RAD51D, SDHB, SDHC, SDHD, SMAD4, SMARCA4. STK11, TP53, TSC1, TSC2, and VHL.  The following genes were evaluated for sequence changes only: SDHA and HOXB13 c.251G>A variant only.   09/16/2018 Surgery   Left mastectomy [facility]) [phone]): invasive ductal carcinoma, grade 3, with high grade DCIS. Negative margins. 6 lymph nodes were negative for carcinoma. ER/PR negative. Her-2 positive. Ki67 70%.   10/21/2018 - 10/06/2019 Chemotherapy   PACLitaxel (TAXOL) 150 mg in sodium chloride 0.9 % 250 mL chemo infusion (</= $RemoveBefor'80mg'flFskbTUhOli$ /m2), 80 mg/m2 = 150 mg, Intravenous,  Once, 2 of 2 cycles. Administration: 150 mg (10/21/2018), 150 mg (10/29/2018), 150 mg (11/19/2018), 150 mg (11/05/2018), 150 mg (11/12/2018), 150 mg (11/26/2018), 150 mg (12/10/2018), 150 mg (12/16/2018).  trastuzumab-dkst (OGIVRI) 300 mg in sodium chloride 0.9 % 250 mL chemo infusion, 315 mg, Intravenous,  Once, 16 of 16 cycles. Administration: 300 mg (10/21/2018), 150 mg (11/19/2018), 450 mg (12/24/2018), 450 mg (01/13/2019), 450 mg  (03/17/2019), 450 mg (04/07/2019), 450 mg (06/09/2019), 450 mg (06/30/2019), 450 mg (09/08/2019), 150 mg (11/05/2018), 150 mg (11/12/2018), 150 mg (11/26/2018), 150 mg (12/10/2018), 150 mg (12/16/2018), 450 mg (02/03/2019), 450 mg (02/24/2019), 450 mg (04/28/2019), 450 mg (05/19/2019), 450 mg (07/28/2019), 450 mg (08/18/2019), 450 mg (10/06/2019).     INTERVAL HISTORY:  Ms. Balin to review her survivorship care plan detailing her treatment course for breast cancer, as well as monitoring long-term side effects of that treatment, education regarding health maintenance, screening, and overall wellness and health promotion.     Overall, Ms. Bruss reports feeling quite well.  She completed her survivorship survey and has some COPD that causes chronic shortness of breath, some 5/10 fatigue, left underarm swelling that is stable, mild residual peripheral neuropathy from chemotherapy--no motor changes.  She has a 60 pack year tobacco history, quite 5 years ago, and is walking and eating healthy  REVIEW OF SYSTEMS:  Review of Systems  Constitutional: Positive for fatigue. Negative for appetite change, chills, fever and unexpected weight change.  HENT:   Negative for hearing loss, lump/mass and trouble swallowing.   Eyes: Negative for eye problems and icterus.  Respiratory: Positive for shortness of breath (chronic). Negative for chest tightness and cough.   Cardiovascular: Negative for chest pain, leg swelling and palpitations.  Gastrointestinal: Negative for abdominal distention, abdominal pain, constipation, diarrhea, nausea and vomiting.  Endocrine: Negative for hot flashes.  Genitourinary: Negative for difficulty urinating.   Musculoskeletal: Negative for arthralgias.  Skin: Negative for itching and rash.  Neurological: Positive for numbness. Negative for dizziness, extremity weakness and headaches.  Hematological: Negative for adenopathy. Does not bruise/bleed easily.  Psychiatric/Behavioral: Negative for  depression. The patient is not nervous/anxious.    Breast: Denies any new nodularity, masses,  tenderness, nipple changes, or nipple discharge.      ONCOLOGY TREATMENT TEAM:  1. Surgeon:  Dr. Ninfa Linden at [facility] 2. Medical Oncologist: Dr. Jana Hakim  3. Radiation Oncologist: Dr. Dalbert Batman    PAST MEDICAL/SURGICAL HISTORY:  Past Medical History:  Diagnosis Date  . Angina at rest [facility])   . Anxiety   . Cancer [facility]) 2000   left breast cancer-partial mastectomy  . COPD (chronic obstructive pulmonary disease) ([place])   . Depression   . Family history of breast cancer   . Family history of esophageal cancer   . Family history of lung cancer   . Family history of prostate cancer   . GERD (gastroesophageal reflux disease)   . Hyperlipidemia   . Hypertension   . Hypothyroidism   . Recurrent breast cancer, left [facility])    Past Surgical History:  Procedure Laterality Date  . ADENOIDECTOMY    . APPLICATION OF WOUND VAC Left 04/01/2019   Procedure: Placement of provena wound vac;  Surgeon: Wallace Going, DO;  Location: [place];  Service: Plastics;  Laterality: Left;  . CARDIAC CATHETERIZATION    . DEBRIDEMENT AND CLOSURE WOUND Left 04/01/2019   Procedure: Left breast wound excision and closure;  Surgeon: Wallace Going, DO;  Location: [place];  Service: Plastics;  Laterality: Left;  . LEFT HEART CATHETERIZATION WITH CORONARY ANGIOGRAM N/A 10/21/2013   Procedure: LEFT HEART CATHETERIZATION WITH CORONARY ANGIOGRAM;  Surgeon: Burnell Blanks, MD;  Location: [facility] CATH LAB;  Service: Cardiovascular;  Laterality: N/A;  . MASTECTOMY W/ SENTINEL NODE BIOPSY Left 09/16/2018   Procedure: LEFT TOTAL MASTECTOMY WITH LEFT AXILLARY DEEP SENTINEL LYMPH NODE BIOPSY AND INJECT BLUE DYE;  Surgeon: Fanny Skates, MD;  Location: [place];  Service: General;  Laterality: Left;  Marland Kitchen MASTECTOMY, PARTIAL Left 2000  . PORT-A-CATH REMOVAL  Right 10/20/2019   Procedure: REMOVAL PORT-A-CATH;  Surgeon: Coralie Keens, MD;  Location: [place];  Service: General;  Laterality: Right;  . PORTACATH PLACEMENT Right 09/16/2018   Procedure: INSERTION PORT-A-CATH;  Surgeon: Fanny Skates, MD;  Location: [place];  Service: General;  Laterality: Right;  . TONSILLECTOMY       ALLERGIES:  Allergies  Allergen Reactions  . Benadryl [Diphenhydramine Hcl (Sleep)] Palpitations    Increased heartrate  . Sulfa Antibiotics Rash     CURRENT MEDICATIONS:  Outpatient Encounter Medications as of 01/19/2020  Medication Sig  . albuterol (PROVENTIL HFA;VENTOLIN HFA) 108 (90 BASE) MCG/ACT inhaler Inhale 2 puffs into the lungs every 6 (six) hours as needed for wheezing or shortness of breath.  Marland Kitchen atorvastatin (LIPITOR) 20 MG tablet Take 20 mg by mouth at bedtime.  . Cholecalciferol 1000 UNITS capsule Take 500 Units by mouth daily.   Marland Kitchen escitalopram (LEXAPRO) 10 MG tablet Take 10 mg by mouth daily.  . Fluticasone-Umeclidin-Vilant (TRELEGY ELLIPTA) 100-62.5-25 MCG/INH AEPB Inhale 1 puff into the lungs daily as needed.  . furosemide (LASIX) 20 MG tablet TAKE ONE TABLET AS NEEDED FOR WEIGHT GAIN OF 2 POUNDS IN 24 HOURS OR 5 POUNDS IN 7 DAYS  . levothyroxine (SYNTHROID) 75 MCG tablet Take by mouth.  . levothyroxine (SYNTHROID, LEVOTHROID) 50 MCG tablet Take 1 tablet by mouth every other day. Alternate 50 mcg and 75 mcg every other day  . olmesartan (BENICAR) 20 MG tablet Take 20 mg by mouth daily.   Facility-Administered Encounter Medications as of 01/19/2020  Medication  . 0.9 %  sodium chloride  infusion     ONCOLOGIC FAMILY HISTORY:  Family History  Problem Relation Age of Onset  . Heart disease Mother   . Hypertension Father   . Cancer Father   . Prostate cancer Father   . Hypertension Sister   . Breast cancer Sister        dx 37s  . Heart disease Sister   . Hypertension Brother   . Hyperlipidemia  Maternal Grandmother   . Stroke Maternal Grandmother   . Hypertension Brother   . Breast cancer Maternal Aunt   . Lung cancer Maternal Aunt   . Esophageal cancer Maternal Aunt   . Stomach cancer Paternal Grandmother      GENETIC COUNSELING/TESTING: See above  SOCIAL HISTORY:  Social History   Socioeconomic History  . Marital status: Divorced    Spouse name: Not on file  . Number of children: Not on file  . Years of education: Not on file  . Highest education level: Not on file  Occupational History  . Not on file  Tobacco Use  . Smoking status: Former Smoker    Packs/day: 1.00    Years: 38.00    Pack years: 38.00    Types: Cigarettes  . Smokeless tobacco: Never Used  Substance and Sexual Activity  . Alcohol use: No  . Drug use: No  . Sexual activity: Not Currently    Birth control/protection: Post-menopausal  Other Topics Concern  . Not on file  Social History Narrative  . Not on file   Social Determinants of Health   Financial Resource Strain: Not on file  Food Insecurity: Not on file  Transportation Needs: Not on file  Physical Activity: Not on file  Stress: Not on file  Social Connections: Not on file  Intimate Partner Violence: Not on file     OBSERVATIONS/OBJECTIVE:  BP (!) 110/58 (BP Location: Right Arm, Patient Position: Sitting)   Pulse 76   Temp (!) 97.5 F (36.4 C) (Tympanic)   Resp 18   Ht 5' 3.5" (1.613 m)   Wt 170 lb 11.2 oz (77.4 kg)   SpO2 100%   BMI 29.76 kg/m  GENERAL: Patient is a well appearing female in no acute distress HEENT:  Sclerae anicteric.  Mask in place.  Neck is supple.  NODES:  No cervical, supraclavicular, or axillary lymphadenopathy palpated.  BREAST EXAM:  Left breast s/p mastectomy, no sign of local recurrence, right breast benign LUNGS:  Clear to auscultation bilaterally.  No wheezes or rhonchi. HEART:  Regular rate and rhythm. No murmur appreciated. ABDOMEN:  Soft, nontender.  Positive, normoactive bowel  sounds. No organomegaly palpated. MSK:  No focal spinal tenderness to palpation. Full range of motion bilaterally in the upper extremities. EXTREMITIES:  No peripheral edema.   SKIN:  Clear with no obvious rashes or skin changes. No nail dyscrasia. NEURO:  Nonfocal. Well oriented.  Appropriate affect.   LABORATORY DATA:  None for this visit.  DIAGNOSTIC IMAGING:  None for this visit.      ASSESSMENT AND PLAN:  Ms.. Hodgkiss is a pleasant 64 y.o. female with Stage IIA left breast invasive ductal carcinoma, ER-/PR-/HER2+, diagnosed in 06/2018, treated with mastectomy and adjuvant chemotherapy/maintenance trastuzumab.  She presents to the Survivorship Clinic for our initial meeting and routine follow-up post-completion of treatment for breast cancer.    1. Stage IIA left breast cancer:  Ms. Paternoster is continuing to recover from definitive treatment for breast cancer. She will follow-up with her medical oncologist, Dr.Magrinat in 03/2020  with history and physical exam per surveillance protocol.  Her mammogram is due 06/2020.   Today, a comprehensive survivorship care plan and treatment summary was reviewed with the patient today detailing her breast cancer diagnosis, treatment course, potential late/long-term effects of treatment, appropriate follow-up care with recommendations for the future, and patient education resources.  A copy of this summary, along with a letter will be sent to the patient's primary care provider via mail/fax/In Basket message after today's visit.    2. Fatigue: I recommended ginseng for her to take BID  3. Bone health:  She was given education on specific activities to promote bone health.  4. Cancer screening:  Due to Ms. Trefz's history and her age, she should receive screening for skin cancers, colon cancer, lung cancer, and gynecologic cancers.  The information and recommendations are listed on the patient's comprehensive care plan/treatment summary and were  reviewed in detail with the patient.    5. Health maintenance and wellness promotion: Ms. Natzke was encouraged to consume 5-7 servings of fruits and vegetables per day. We reviewed the "Nutrition [employer]" handout, as well as the handout "Take Control of Your Health and Reduce Your Cancer Risk" from the [employer].  She was also encouraged to engage in moderate to vigorous exercise for 30 minutes per day most days of the week. We discussed the [employer] fitness program, which is designed for cancer survivors to help them become more physically fit after cancer treatments.  She was instructed to limit her alcohol consumption and continue to abstain from tobacco use.     6. Support services/counseling: It is not uncommon for this period of the patient's cancer care trajectory to be one of many emotions and stressors.  We discussed how this can be increasingly difficult during the times of quarantine and social distancing due to the COVID-19 pandemic.   She was given information regarding our available services and encouraged to contact me with any questions or for help enrolling in any of our support group/programs.    Follow up instructions:    -Return to cancer center in 03/2020 for f/u with Dr. Jana Hakim  -Mammogram due in 06/2020 -Lung cancer screening -She is welcome to return back to the Survivorship Clinic at any time; no additional follow-up needed at this time.  -Consider referral back to survivorship as a long-term survivor for continued surveillance  The patient was provided an opportunity to ask questions and all were answered. The patient agreed with the plan and demonstrated an understanding of the instructions.   Total encounter time: 30 minutes*  Wilber Bihari, NP 01/19/20 9:24 AM Medical Oncology and Hematology [facility] [place], [place] [address] Tel. [phone]    Fax. [phone]  *Total Encounter Time as defined by the  Centers for Medicare and Medicaid Services includes, in addition to the face-to-face time of a patient visit (documented in the note above) non-face-to-face time: obtaining and reviewing outside history, ordering and reviewing medications, tests or procedures, care coordination (communications with other health care professionals or caregivers) and documentation in the medical record.

## 2020-01-19 ENCOUNTER — Inpatient Hospital Stay: Payer: BC Managed Care – PPO | Attending: Adult Health | Admitting: Adult Health

## 2020-01-19 ENCOUNTER — Other Ambulatory Visit: Payer: Self-pay

## 2020-01-19 ENCOUNTER — Encounter: Payer: Self-pay | Admitting: Adult Health

## 2020-01-19 VITALS — BP 110/58 | HR 76 | Temp 97.5°F | Resp 18 | Ht 63.5 in | Wt 170.7 lb

## 2020-01-19 DIAGNOSIS — Z9221 Personal history of antineoplastic chemotherapy: Secondary | ICD-10-CM | POA: Diagnosis not present

## 2020-01-19 DIAGNOSIS — Z9012 Acquired absence of left breast and nipple: Secondary | ICD-10-CM | POA: Diagnosis not present

## 2020-01-19 DIAGNOSIS — Z171 Estrogen receptor negative status [ER-]: Secondary | ICD-10-CM | POA: Diagnosis not present

## 2020-01-19 DIAGNOSIS — C50412 Malignant neoplasm of upper-outer quadrant of left female breast: Secondary | ICD-10-CM

## 2020-01-20 DIAGNOSIS — C50412 Malignant neoplasm of upper-outer quadrant of left female breast: Secondary | ICD-10-CM | POA: Diagnosis not present

## 2020-01-21 DIAGNOSIS — C50412 Malignant neoplasm of upper-outer quadrant of left female breast: Secondary | ICD-10-CM | POA: Diagnosis not present

## 2020-02-16 IMAGING — CR PORTABLE CHEST - 1 VIEW
1 series · 1 of 1 positions shown · non-contrast
Comparison: 09/09/2018

CLINICAL DATA: Port-A-Cath insertion

EXAM:
PORTABLE CHEST 1 VIEW

[chest ap]
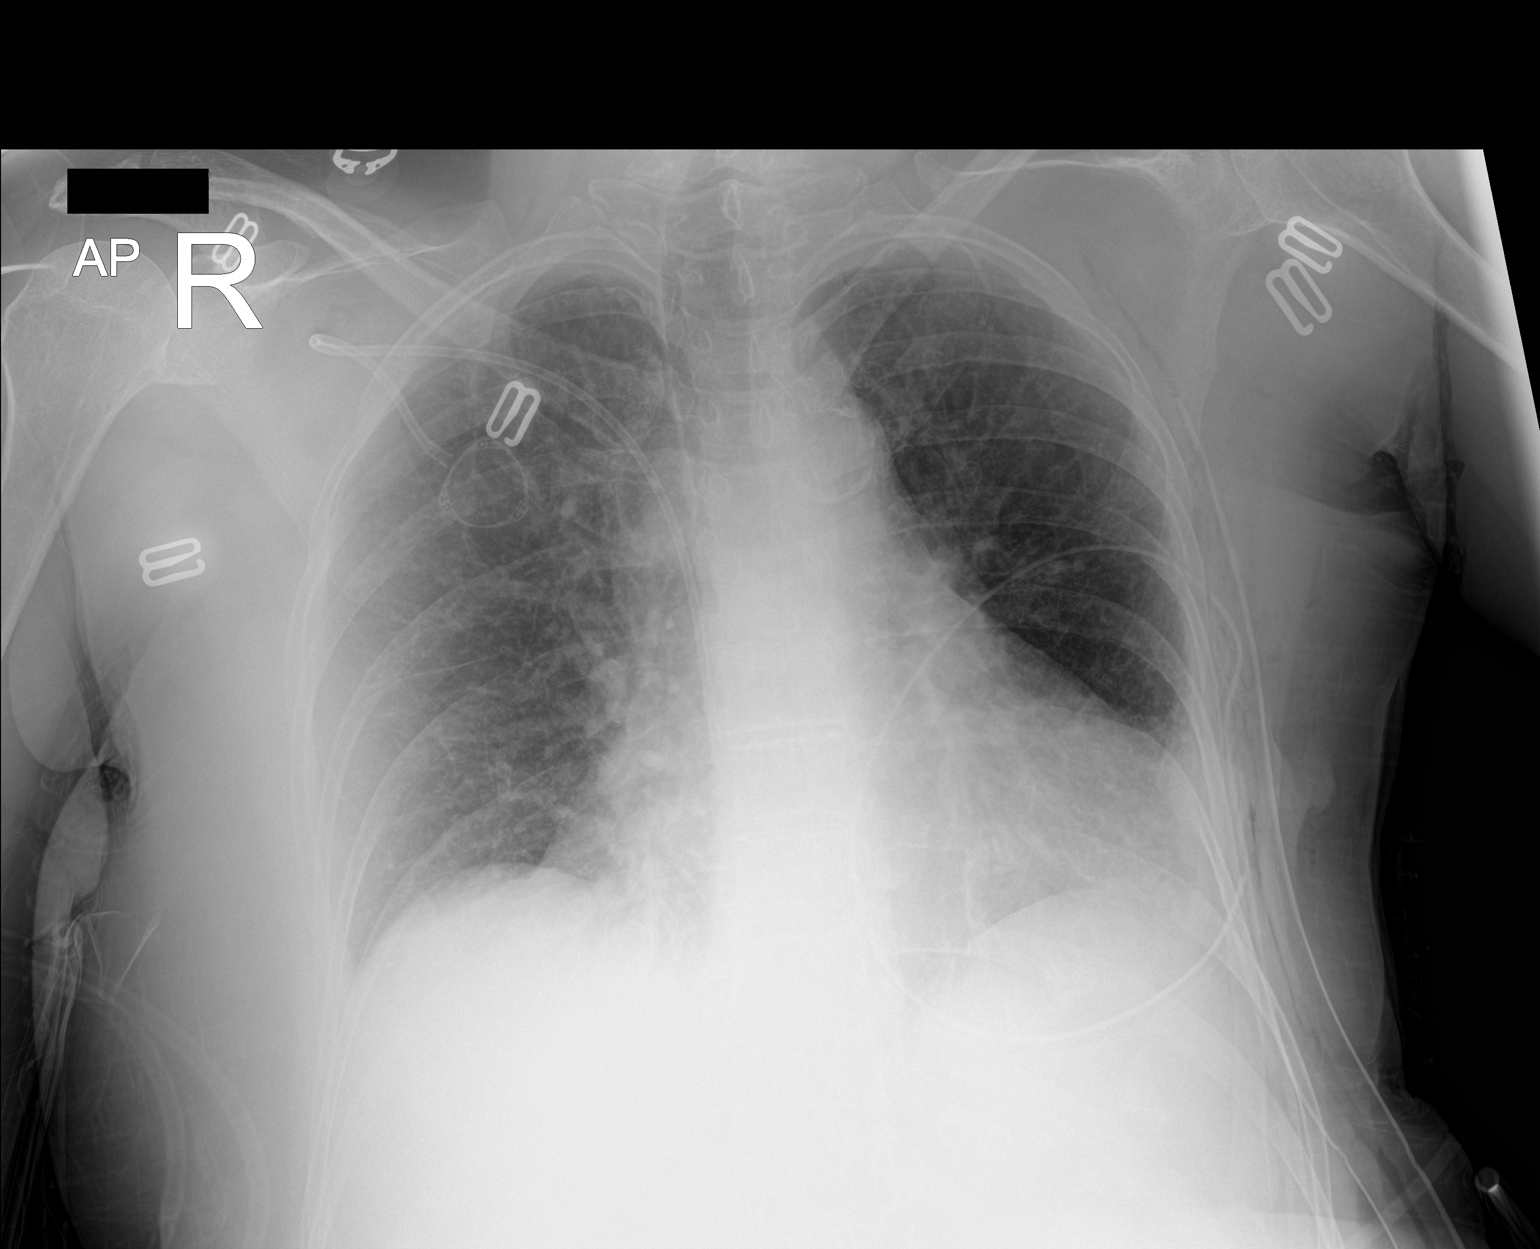

[1 of 1 positions shown; findings below may reference images not displayed]

FINDINGS: Right jugular Port-A-Cath is in place with the tip at the cavoatrial
junction. No pneumothorax.

Cardiac enlargement without heart failure edema or effusion. Mild
bibasilar atelectasis due to hypoventilation

Surgical drains and postsurgical gas in the soft tissues of the left
breast.
IMPRESSION: Port-A-Cath tip cavoatrial junction without pneumothorax

Progression of bibasilar atelectasis.

## 2020-02-18 ENCOUNTER — Other Ambulatory Visit: Payer: Self-pay

## 2020-02-18 ENCOUNTER — Ambulatory Visit (HOSPITAL_COMMUNITY)
Admission: RE | Admit: 2020-02-18 | Discharge: 2020-02-18 | Disposition: A | Discharge status: Home / Self Care | Payer: BC Managed Care – PPO | Source: Ambulatory Visit | Attending: Adult Health | Admitting: Adult Health

## 2020-02-18 DIAGNOSIS — Z87891 Personal history of nicotine dependence: Secondary | ICD-10-CM | POA: Diagnosis not present

## 2020-02-18 DIAGNOSIS — C50412 Malignant neoplasm of upper-outer quadrant of left female breast: Secondary | ICD-10-CM

## 2020-02-18 DIAGNOSIS — Z171 Estrogen receptor negative status [ER-]: Secondary | ICD-10-CM | POA: Diagnosis not present

## 2020-02-24 DIAGNOSIS — I10 Essential (primary) hypertension: Secondary | ICD-10-CM | POA: Diagnosis not present

## 2020-02-24 DIAGNOSIS — E039 Hypothyroidism, unspecified: Secondary | ICD-10-CM | POA: Diagnosis not present

## 2020-02-29 DIAGNOSIS — J449 Chronic obstructive pulmonary disease, unspecified: Secondary | ICD-10-CM | POA: Diagnosis not present

## 2020-02-29 DIAGNOSIS — E039 Hypothyroidism, unspecified: Secondary | ICD-10-CM | POA: Diagnosis not present

## 2020-02-29 DIAGNOSIS — D696 Thrombocytopenia, unspecified: Secondary | ICD-10-CM | POA: Diagnosis not present

## 2020-02-29 DIAGNOSIS — N289 Disorder of kidney and ureter, unspecified: Secondary | ICD-10-CM | POA: Diagnosis not present

## 2020-03-07 DIAGNOSIS — Z1152 Encounter for screening for COVID-19: Secondary | ICD-10-CM | POA: Diagnosis not present

## 2020-03-07 DIAGNOSIS — Z20822 Contact with and (suspected) exposure to covid-19: Secondary | ICD-10-CM | POA: Diagnosis not present

## 2020-04-03 NOTE — Progress Notes (Signed)
Dorothy Frederick  Telephone:([phone]) [phone] Fax:(336) [phone]    ID: Dorothy Frederick DOB: 1955-07-14  MR#: [id number]  VQM#:[id number]  Patient Care Team: [name], MD as PCP - General (Family Medicine) Skeet Latch, MD as PCP - Cardiology (Cardiology) Jakota Manthei, Virgie Dad, MD as Consulting Physician (Oncology) Gery Pray, MD as Consulting Physician (Radiation Oncology) Sanda Klein, MD as Consulting Physician (Cardiology) Coralie Keens, MD as Consulting Physician (General Surgery) OTHER MD:   CHIEF COMPLAINT: Estrogen receptor negative breast cancer  CURRENT TREATMENT: Observation   INTERVAL HISTORY: Dorothy Frederick returns today for follow-up of her estrogen receptor negative breast cancer.   She completed her trastuzumab at her last visit on 10/06/2019.  She had her port removed 10/20/2019  She had her port removed on 10/20/2019.  This went without complications  Since her last visit, she underwent lung cancer screening chest CT on 02/18/2020 showing: benign appearance or behavior. Continued annual screening recommended.   REVIEW OF SYSTEMS: Dorothy Frederick tells me she lost a few pounds in the last few weeks.  She is not quite sure why.  She has not yet started exercising regularly because it still pretty cold she says.  She had one cold after another this winter she says.  Of course she does have COPD.  Currently as she denies any unusual headaches visual changes cough phlegm production pleurisy or worsening shortness of breath.  A detailed review of systems was otherwise stable   COVID 19 VACCINATION STATUS: Status post Pfizer x2 with booster October 2021   HISTORY OF CURRENT ILLNESS: From the original intake note:  Dorothy Frederick has a history of left breast cancer diagnosed in 2000, and treated with a partial mastectomy.  She has been followed by Dr. Matthew Saras in [facility]. She was also treated with Tamoxifen for 1.5 years, which she tolerated well, but had to  discontinue due to financial difficulties. She did not receive chemotherapy. She notes that her cancer was lymph node negative.  More recently, she had routine screening mammography on 07/06/2018 showing: Breast Density Category B. On mammography, there is a 1.5 cm oval mass with associated calcifications in the left breast upper outer quadrant posterior depth 6.6 cm from the nipple. This mass is approximately 3 cm anterior to the lumpectomy site in the upper outer posterior left breast. No other significant masses, calcifications, or other findings are seen in either breast.   Accordingly on 07/13/2018 she proceeded to biopsy of the left breast area in question. The pathology from this procedure showed ([id number]): invasive ductal carcinoma, grade III, 2 o'clock, 5 cm from the nipple. Prognostic indicators significant for: estrogen receptor, 0% negative and progesterone receptor, 0% negative. Proliferation marker Ki67 at 70%. HER2 positive (3+) by immunohistochemistry.  The patient's subsequent history is as detailed below.   PAST MEDICAL HISTORY: Past Medical History:  Diagnosis Date  . Angina at rest [facility])   . Anxiety   . Cancer [facility]) 2000   left breast cancer-partial mastectomy  . COPD (chronic obstructive pulmonary disease) ([place])   . Depression   . Family history of breast cancer   . Family history of esophageal cancer   . Family history of lung cancer   . Family history of prostate cancer   . GERD (gastroesophageal reflux disease)   . Hyperlipidemia   . Hypertension   . Hypothyroidism   . Recurrent breast cancer, left ([place])     PAST SURGICAL HISTORY: Past Surgical History:  Procedure Laterality Date  . ADENOIDECTOMY    .  APPLICATION OF WOUND VAC Left 04/01/2019   Procedure: Placement of provena wound vac;  Surgeon: Peggye Form, DO;  Location: [facility];  Service: Plastics;  Laterality: Left;  . CARDIAC CATHETERIZATION    . DEBRIDEMENT AND CLOSURE  WOUND Left 04/01/2019   Procedure: Left breast wound excision and closure;  Surgeon: Peggye Form, DO;  Location: [facility];  Service: Plastics;  Laterality: Left;  . LEFT HEART CATHETERIZATION WITH CORONARY ANGIOGRAM N/A 10/21/2013   Procedure: LEFT HEART CATHETERIZATION WITH CORONARY ANGIOGRAM;  Surgeon: Kathleene Hazel, MD;  Location: [facility] CATH LAB;  Service: Cardiovascular;  Laterality: N/A;  . MASTECTOMY W/ SENTINEL NODE BIOPSY Left 09/16/2018   Procedure: LEFT TOTAL MASTECTOMY WITH LEFT AXILLARY DEEP SENTINEL LYMPH NODE BIOPSY AND INJECT BLUE DYE;  Surgeon: Claud Kelp, MD;  Location: [facility];  Service: General;  Laterality: Left;  Marland Kitchen MASTECTOMY, PARTIAL Left 2000  . PORT-A-CATH REMOVAL Right 10/20/2019   Procedure: REMOVAL PORT-A-CATH;  Surgeon: Abigail Miyamoto, MD;  Location: [facility];  Service: General;  Laterality: Right;  . PORTACATH PLACEMENT Right 09/16/2018   Procedure: INSERTION PORT-A-CATH;  Surgeon: Claud Kelp, MD;  Location: [facility];  Service: General;  Laterality: Right;  . TONSILLECTOMY      FAMILY HISTORY: Family History  Problem Relation Age of Onset  . Heart disease Mother   . Hypertension Father   . Cancer Father   . Prostate cancer Father   . Hypertension Sister   . Breast cancer Sister        dx 15s  . Heart disease Sister   . Hypertension Brother   . Hyperlipidemia Maternal Grandmother   . Stroke Maternal Grandmother   . Hypertension Brother   . Breast cancer Maternal Aunt   . Lung cancer Maternal Aunt   . Esophageal cancer Maternal Aunt   . Stomach cancer Paternal Grandmother    Dorothy Frederick's father died from prostate cancer at age 53. Patients' mother died from heart complications at age 90. The patient has 2 half brothers and 2 half sisters. Patient denies anyone in her family having ovarian, prostate, or pancreatic cancer. 1 maternal aunt and 1 half aunt (half-sister of the  patient's mother) had breast cancer breast cancer.    GYNECOLOGIC HISTORY:  No LMP recorded. Patient is postmenopausal. 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 She is divorced. She lives with her daughter and two grandchildren. Her daughter, Maralyn Sago, is 41 and works for [employer] as a Nature conservation officer.  Sarah's children Piper, 13, and Conner, 10 also live with the patient. Izadora's other daughter, Marcelino Duster, is a Comptroller at [employer]. Ellianah has 4 grandchildren in total.   ADVANCED DIRECTIVES:  Not in place. Ashelynn was given the appropriate forms on 07/22/18 to fill out and return at their own discretion.     HEALTH MAINTENANCE: Social History   Tobacco Use  . Smoking status: Former Smoker    Packs/day: 1.00    Years: 38.00    Pack years: 38.00    Types: Cigarettes  . Smokeless tobacco: Never Used  Substance Use Topics  . Alcohol use: No  . Drug use: No    Colonoscopy: 2017  PAP:    Bone density: yes, [facility]; osteoporosis  Mammography: up to date  Allergies  Allergen Reactions  . Benadryl [Diphenhydramine Hcl (Sleep)] Palpitations    Increased heartrate  . Sulfa Antibiotics Rash    Current Outpatient Medications  Medication Sig Dispense Refill  . albuterol (PROVENTIL HFA;VENTOLIN HFA) 108 (90 BASE) MCG/ACT inhaler Inhale 2 puffs into the lungs every 6 (six) hours as needed for wheezing or shortness of breath.    Marland Kitchen atorvastatin (LIPITOR) 20 MG tablet Take 20 mg by mouth at bedtime.    . Cholecalciferol 1000 UNITS capsule Take 500 Units by mouth daily.     Marland Kitchen escitalopram (LEXAPRO) 10 MG tablet Take 10 mg by mouth daily.  3  . Fluticasone-Umeclidin-Vilant (TRELEGY ELLIPTA) 100-62.5-25 MCG/INH AEPB Inhale 1 puff into the lungs daily as needed.    . furosemide (LASIX) 20  MG tablet TAKE ONE TABLET AS NEEDED FOR WEIGHT GAIN OF 2 POUNDS IN 24 HOURS OR 5 POUNDS IN 7 DAYS 30 tablet 1  . levothyroxine (SYNTHROID) 75 MCG tablet Take by mouth.    . levothyroxine (SYNTHROID, LEVOTHROID) 50 MCG tablet Take 1 tablet by mouth every other day. Alternate 50 mcg and 75 mcg every other day    . olmesartan (BENICAR) 20 MG tablet Take 20 mg by mouth daily.     No current facility-administered medications for this visit.   Facility-Administered Medications Ordered in Other Visits  Medication Dose Route Frequency Provider Last Rate Last Admin  . 0.9 %  sodium chloride infusion   Intravenous Continuous Gardenia Phlegm, NP   Stopped at 12/24/18 1548    OBJECTIVE: white woman who appears stated age  Vitals:   04/04/20 0926  BP: (!) 138/59  Pulse: 80  Resp: 17  Temp: (!) 97.4 F (36.3 C)  SpO2: 100%   Wt Readings from Last 3 Encounters:  04/04/20 169 lb 3.2 oz (76.7 kg)  01/19/20 170 lb 11.2 oz (77.4 kg)  11/16/19 170 lb 9.6 oz (77.4 kg)   Body mass index is 29.5 kg/m.    ECOG FS:1 - Symptomatic but completely ambulatory  Sclerae unicteric, EOMs intact Wearing a mask No cervical or supraclavicular adenopathy Lungs no rales or rhonchi Heart regular rate and rhythm Abd soft, nontender, positive bowel sounds MSK no focal spinal tenderness, no upper extremity lymphedema Neuro: nonfocal, well oriented, appropriate affect Breasts: The right breast is benign.  The left breast is status post mastectomy.  There is no evidence of chest wall recurrence.  Both axillae are benign.   LAB RESULTS:  CMP     Component Value Date/Time   NA 139 10/18/2019 1100   K 4.6 10/18/2019 1100   CL 104 10/18/2019 1100   CO2 25 10/18/2019 1100   GLUCOSE 89 10/18/2019 1100   BUN 16 10/18/2019 1100   CREATININE 1.11 (H) 10/18/2019 1100   CREATININE 1.11 (H) 07/22/2018 0835   CREATININE 0.97 10/18/2013 1527   CALCIUM 9.4 10/18/2019 1100   PROT 7.4 10/06/2019 1410    ALBUMIN 3.6 10/06/2019 1410   AST 18 10/06/2019 1410   AST 17 07/22/2018 0835   ALT 23 10/06/2019 1410   ALT 20 07/22/2018 0835   ALKPHOS 120 10/06/2019 1410   BILITOT 0.5 10/06/2019 1410   BILITOT 0.6 07/22/2018 0835   GFRNONAA 52 (L) 10/18/2019 1100   GFRNONAA 53 (L) 07/22/2018 0835   GFRAA >60 10/18/2019 1100   GFRAA >60 07/22/2018 0835    No results found for: TOTALPROTELP, ALBUMINELP, A1GS, A2GS, BETS, BETA2SER, GAMS, MSPIKE, SPEI  No results  found for: Nils Pyle, [facility]  Lab Results  Component Value Date   WBC 4.5 04/04/2020   NEUTROABS 2.9 04/04/2020   HGB 13.1 04/04/2020   HCT 40.2 04/04/2020   MCV 88.5 04/04/2020   PLT 273 04/04/2020    Lab Results  Component Value Date   LABCA2 24 09/08/2006    No components found for: LKTGYB638  No results for input(s): INR in the last 168 hours.  Lab Results  Component Value Date   LABCA2 24 09/08/2006    No results found for: LHT342  No results found for: AJG811  No results found for: XBW620  No results found for: CA2729  No components found for: HGQUANT  No results found for: CEA1 / No results found for: CEA1   No results found for: AFPTUMOR  No results found for: CHROMOGRNA  No results found for: HGBA, HGBA2QUANT, HGBFQUANT, HGBSQUAN (Hemoglobinopathy evaluation)   No results found for: LDH  No results found for: IRON, TIBC, IRONPCTSAT (Iron and TIBC)  No results found for: FERRITIN   Urinalysis No results found for: COLORURINE, APPEARANCEUR, LABSPEC, PHURINE, GLUCOSEU, HGBUR, BILIRUBINUR, KETONESUR, PROTEINUR, UROBILINOGEN, NITRITE, LEUKOCYTESUR   STUDIES:  No results found.   ELIGIBLE FOR AVAILABLE RESEARCH PROTOCOL: no   ASSESSMENT: 65 y.o. [place], [place] woman status post left breast upper outer quadrant biopsy 07/13/2018 for a clinical T1c N0, stage IA invasive ductal carcinoma, grade 3, estrogen and progesterone receptor negative, HER-2 amplified, with an MIB-1 of  70%  (1) status post left mastectomy with sentinel lymph node sampling 09/16/2018 for a pT2, pN0, stage IIA invasive ductal carcinoma, grade 3, with negative margins  (a) a total of 6 sentinel lymph nodes were removed  (b) the patient opted against reconstruction  (c) status post wound debridement 04/01/2019 with benign pathology  (2) adjuvant chemotherapy with paclitaxel and trastuzumab weekly x12 started on 10/21/2018  (a) paclitaxel discontinued after 8 cycles due to peripheral neuropathy.  (3) trastuzumab continued every 21 days to total 1 year (through September 2021)  (a) echo 07/27/2018 shows an ejection fraction in the 55-60% range  (b) echo 01/11/2019 shows an ejection fraction in the 55-60% range  (c) echo 05/10/2019 shows an ejection fraction in the 55-60% range  (d) echocardiography 08/06/2019 and 09/28/2019 showed a 55 to 60% ejection  (4) no indication for postmastectomy radiation  (5) genetic testing on 07/22/2018 through the [facility] Common Hereditary cancer panel found no pathogenic mutations.in APC, ATM, AXIN2, BARD1, BMPR1A, BRCA1, BRCA2, BRIP1, CDH1, CDKN2A (p14ARF), CDKN2A (p16INK4a), CKD4, CHEK2, CTNNA1, DICER1, EPCAM (Deletion/duplication testing only), GREM1 (promoter region deletion/duplication testing only), KIT, MEN1, MLH1, MSH2, MSH3, MSH6, MUTYH, NBN, NF1, NHTL1, PALB2, PDGFRA, PMS2, POLD1, POLE, PTEN, RAD50, RAD51C, RAD51D, SDHB, SDHC, SDHD, SMAD4, SMARCA4. STK11, TP53, TSC1, TSC2, and VHL.  The following genes were evaluated for sequence changes only: SDHA and HOXB13 c.251G>A variant only.   PLAN: Kai little over 1-1/2 years out from definitive surgery for her breast cancer with no evidence of disease recurrence.  This is favorable.  She will have a right mammography in June.  She will see Dr. Sarina Ill in July.  She will follow up with cardiology in October.  Accordingly she will return to see [place] February 2023.  Before that she will have repeat screening with a  low-dose CT of the chest given her smoking history  She knows to call for any other issue that may develop before the next visit  Total encounter time 25 minutes.Sarajane Jews C. Aneudy Champlain,  MD 04/04/20 9:38 AM Medical Oncology and Hematology [facility] [place], [place] [address] Tel. [phone]    Fax. [phone]   I, Wilburn Mylar, am acting as scribe for Dr. Virgie Dad. Omie Ferger.  I, Lurline Del MD, have reviewed the above documentation for accuracy and completeness, and I agree with the above.    *Total Encounter Time as defined by the Centers for Medicare and Medicaid Services includes, in addition to the face-to-face time of a patient visit (documented in the note above) non-face-to-face time: obtaining and reviewing outside history, ordering and reviewing medications, tests or procedures, care coordination (communications with other health care professionals or caregivers) and documentation in the medical record.

## 2020-04-04 ENCOUNTER — Inpatient Hospital Stay: Payer: BC Managed Care – PPO

## 2020-04-04 ENCOUNTER — Other Ambulatory Visit: Payer: Self-pay

## 2020-04-04 ENCOUNTER — Inpatient Hospital Stay: Payer: BC Managed Care – PPO | Attending: Adult Health | Admitting: Oncology

## 2020-04-04 VITALS — BP 138/59 | HR 80 | Temp 97.4°F | Resp 17 | Ht 63.5 in | Wt 169.2 lb

## 2020-04-04 DIAGNOSIS — Z171 Estrogen receptor negative status [ER-]: Secondary | ICD-10-CM

## 2020-04-04 DIAGNOSIS — C50412 Malignant neoplasm of upper-outer quadrant of left female breast: Secondary | ICD-10-CM

## 2020-04-04 DIAGNOSIS — Z800 Family history of malignant neoplasm of digestive organs: Secondary | ICD-10-CM | POA: Insufficient documentation

## 2020-04-04 DIAGNOSIS — Z8349 Family history of other endocrine, nutritional and metabolic diseases: Secondary | ICD-10-CM | POA: Diagnosis not present

## 2020-04-04 DIAGNOSIS — Z9221 Personal history of antineoplastic chemotherapy: Secondary | ICD-10-CM | POA: Diagnosis not present

## 2020-04-04 DIAGNOSIS — I10 Essential (primary) hypertension: Secondary | ICD-10-CM | POA: Insufficient documentation

## 2020-04-04 DIAGNOSIS — E039 Hypothyroidism, unspecified: Secondary | ICD-10-CM | POA: Insufficient documentation

## 2020-04-04 DIAGNOSIS — Z79899 Other long term (current) drug therapy: Secondary | ICD-10-CM | POA: Diagnosis not present

## 2020-04-04 DIAGNOSIS — Z801 Family history of malignant neoplasm of trachea, bronchus and lung: Secondary | ICD-10-CM | POA: Insufficient documentation

## 2020-04-04 DIAGNOSIS — Z87891 Personal history of nicotine dependence: Secondary | ICD-10-CM | POA: Diagnosis not present

## 2020-04-04 DIAGNOSIS — I158 Other secondary hypertension: Secondary | ICD-10-CM

## 2020-04-04 DIAGNOSIS — Z803 Family history of malignant neoplasm of breast: Secondary | ICD-10-CM | POA: Diagnosis not present

## 2020-04-04 DIAGNOSIS — Z8249 Family history of ischemic heart disease and other diseases of the circulatory system: Secondary | ICD-10-CM | POA: Diagnosis not present

## 2020-04-04 DIAGNOSIS — Z853 Personal history of malignant neoplasm of breast: Secondary | ICD-10-CM | POA: Diagnosis not present

## 2020-04-04 LAB — CBC WITH DIFFERENTIAL/PLATELET
Abs Immature Granulocytes: 0.02 10*3/uL (ref 0.00–0.07)
Basophils Absolute: 0 10*3/uL (ref 0.0–0.1)
Basophils Relative: 1 %
Eosinophils Absolute: 0.1 10*3/uL (ref 0.0–0.5)
Eosinophils Relative: 3 %
HCT: 40.2 % (ref 36.0–46.0)
Hemoglobin: 13.1 g/dL (ref 12.0–15.0)
Immature Granulocytes: 0 %
Lymphocytes Relative: 24 %
Lymphs Abs: 1.1 10*3/uL (ref 0.7–4.0)
MCH: 28.9 pg (ref 26.0–34.0)
MCHC: 32.6 g/dL (ref 30.0–36.0)
MCV: 88.5 fL (ref 80.0–100.0)
Monocytes Absolute: 0.4 10*3/uL (ref 0.1–1.0)
Monocytes Relative: 8 %
Neutro Abs: 2.9 10*3/uL (ref 1.7–7.7)
Neutrophils Relative %: 64 %
Platelets: 273 10*3/uL (ref 150–400)
RBC: 4.54 MIL/uL (ref 3.87–5.11)
RDW: 12.9 % (ref 11.5–15.5)
WBC: 4.5 10*3/uL (ref 4.0–10.5)
nRBC: 0 % (ref 0.0–0.2)

## 2020-04-04 LAB — COMPREHENSIVE METABOLIC PANEL
ALT: 17 U/L (ref 0–44)
AST: 15 U/L (ref 15–41)
Albumin: 4.1 g/dL (ref 3.5–5.0)
Alkaline Phosphatase: 116 U/L (ref 38–126)
Anion gap: 9 (ref 5–15)
BUN: 19 mg/dL (ref 8–23)
CO2: 23 mmol/L (ref 22–32)
Calcium: 9.2 mg/dL (ref 8.9–10.3)
Chloride: 107 mmol/L (ref 98–111)
Creatinine, Ser: 1.04 mg/dL — ABNORMAL HIGH (ref 0.44–1.00)
GFR, Estimated: 60 mL/min — ABNORMAL LOW (ref >60)
Glucose, Bld: 102 mg/dL — ABNORMAL HIGH (ref 70–99)
Potassium: 3.8 mmol/L (ref 3.5–5.1)
Sodium: 139 mmol/L (ref 135–145)
Total Bilirubin: 0.7 mg/dL (ref 0.3–1.2)
Total Protein: 7.9 g/dL (ref 6.5–8.1)

## 2020-04-05 ENCOUNTER — Telehealth: Payer: Self-pay | Admitting: Oncology

## 2020-04-05 NOTE — Telephone Encounter (Signed)
Scheduled appointments per 3/15 los. Spoke to patient who is aware of appointments dates and times.  

## 2020-04-06 ENCOUNTER — Telehealth: Payer: Self-pay | Admitting: *Deleted

## 2020-04-06 NOTE — Telephone Encounter (Signed)
-----   Message from Skeet Latch, MD sent at 04/05/2020  8:52 AM EDT ----- CT scan showed that she had some calcification in her aorta and coronary arteries.  This means that her LDL goal should be less than 70.  I know that Dr. Ernie Hew has been following her cholesterol.  If the most recent LDL is not less than 70, would recommend that she increase atorvastatin to 40 mg.

## 2020-04-06 NOTE — Telephone Encounter (Signed)
Advised patient and sent to PCP

## 2020-04-13 ENCOUNTER — Other Ambulatory Visit: Payer: Self-pay | Admitting: Family Medicine

## 2020-04-13 DIAGNOSIS — E782 Mixed hyperlipidemia: Secondary | ICD-10-CM | POA: Diagnosis not present

## 2020-04-13 DIAGNOSIS — I2510 Atherosclerotic heart disease of native coronary artery without angina pectoris: Secondary | ICD-10-CM

## 2020-04-13 DIAGNOSIS — J441 Chronic obstructive pulmonary disease with (acute) exacerbation: Secondary | ICD-10-CM | POA: Diagnosis not present

## 2020-04-13 DIAGNOSIS — I10 Essential (primary) hypertension: Secondary | ICD-10-CM | POA: Diagnosis not present

## 2020-05-01 ENCOUNTER — Ambulatory Visit
Admission: RE | Admit: 2020-05-01 | Discharge: 2020-05-01 | Disposition: A | Discharge status: Home / Self Care | Payer: No Typology Code available for payment source | Source: Ambulatory Visit | Attending: Family Medicine | Admitting: Family Medicine

## 2020-05-01 ENCOUNTER — Other Ambulatory Visit: Payer: Self-pay | Admitting: Family Medicine

## 2020-05-01 ENCOUNTER — Ambulatory Visit: Admission: RE | Admit: 2020-05-01 | Payer: Self-pay | Source: Ambulatory Visit

## 2020-05-01 DIAGNOSIS — I2510 Atherosclerotic heart disease of native coronary artery without angina pectoris: Secondary | ICD-10-CM

## 2020-05-02 ENCOUNTER — Encounter: Payer: Self-pay | Admitting: Adult Health

## 2020-05-02 DIAGNOSIS — Z961 Presence of intraocular lens: Secondary | ICD-10-CM | POA: Diagnosis not present

## 2020-05-04 IMAGING — CR DG CHEST 2V
2 series · 2 of 2 positions shown · non-contrast
Comparison: 09/16/2018 09/09/2018.

CLINICAL DATA: Shortness of breath.

EXAM:
CHEST - 2 VIEW

[w chest pa]
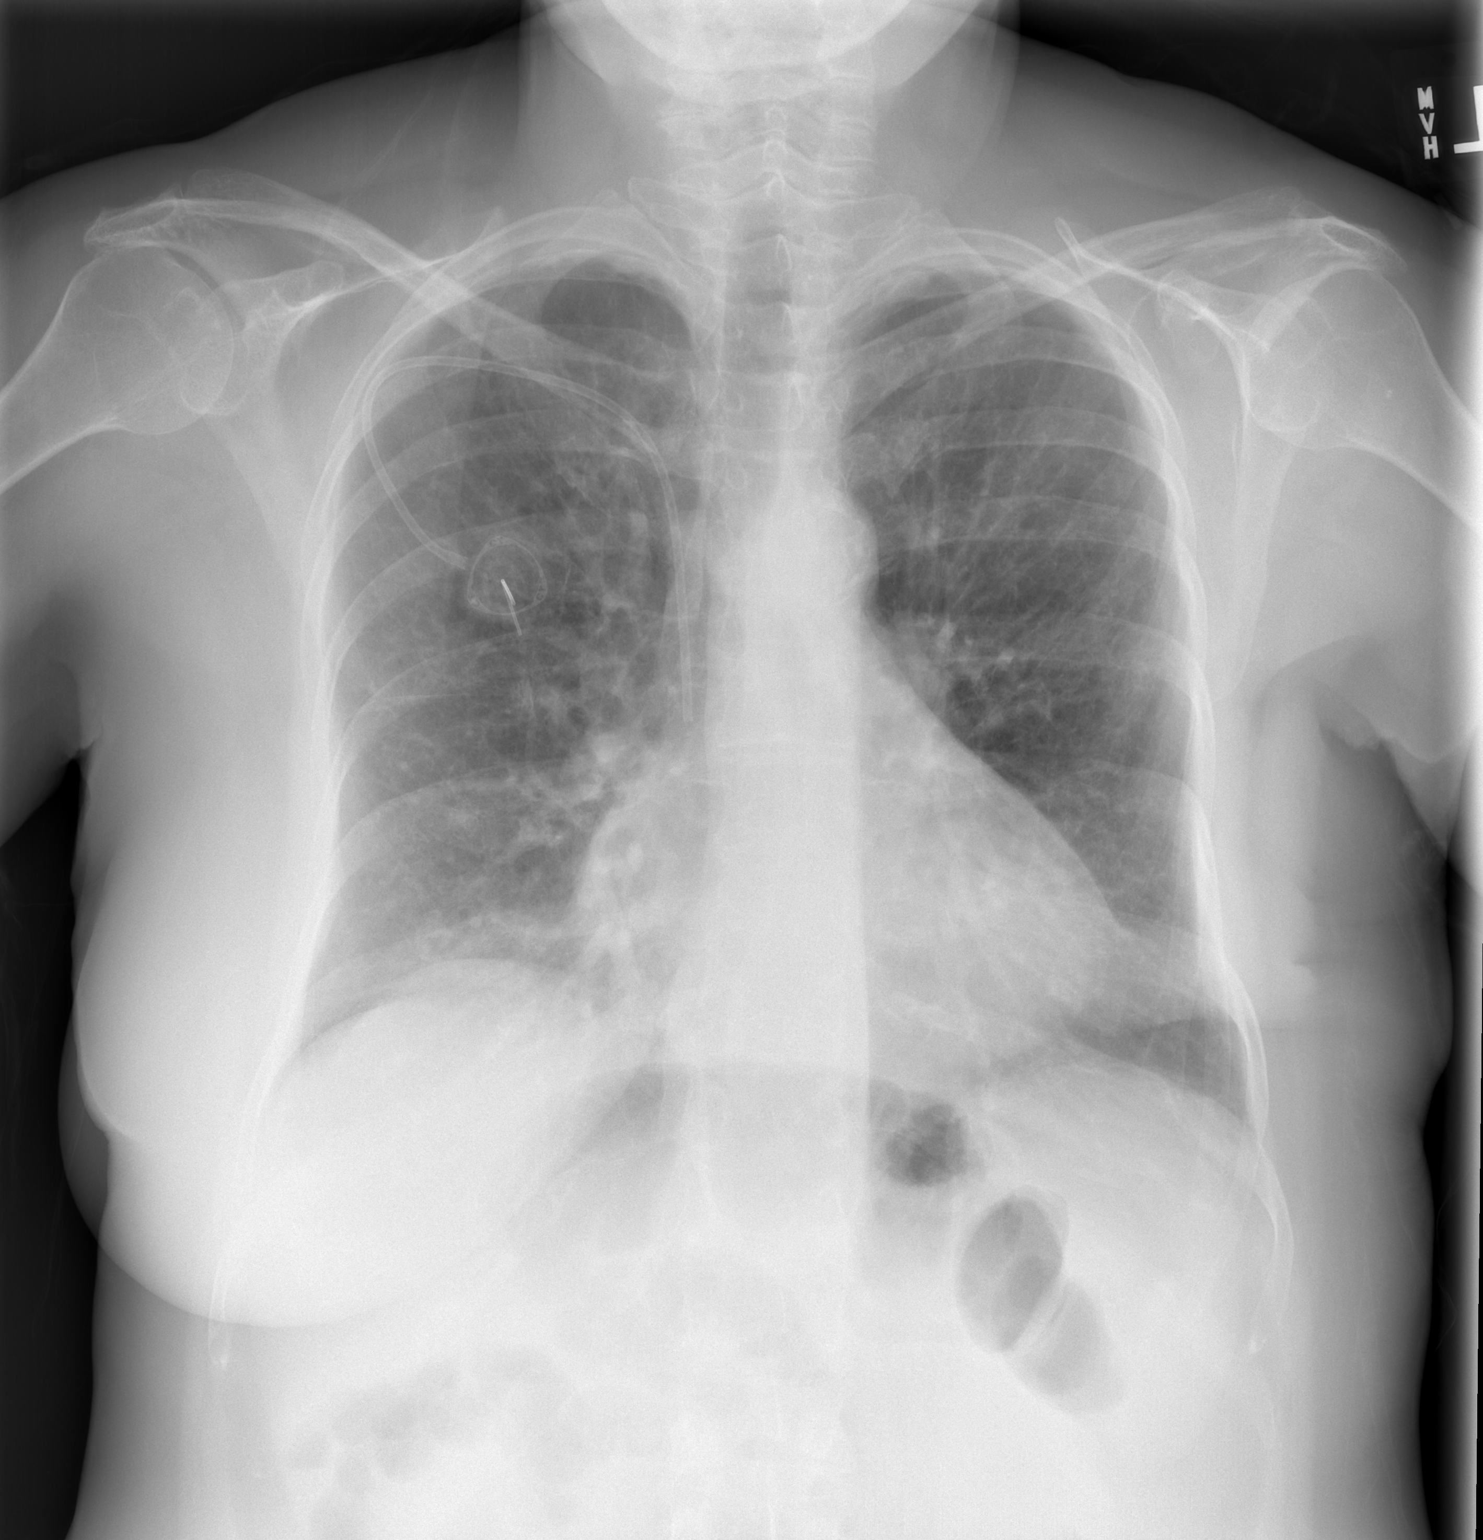

[w chest lat]
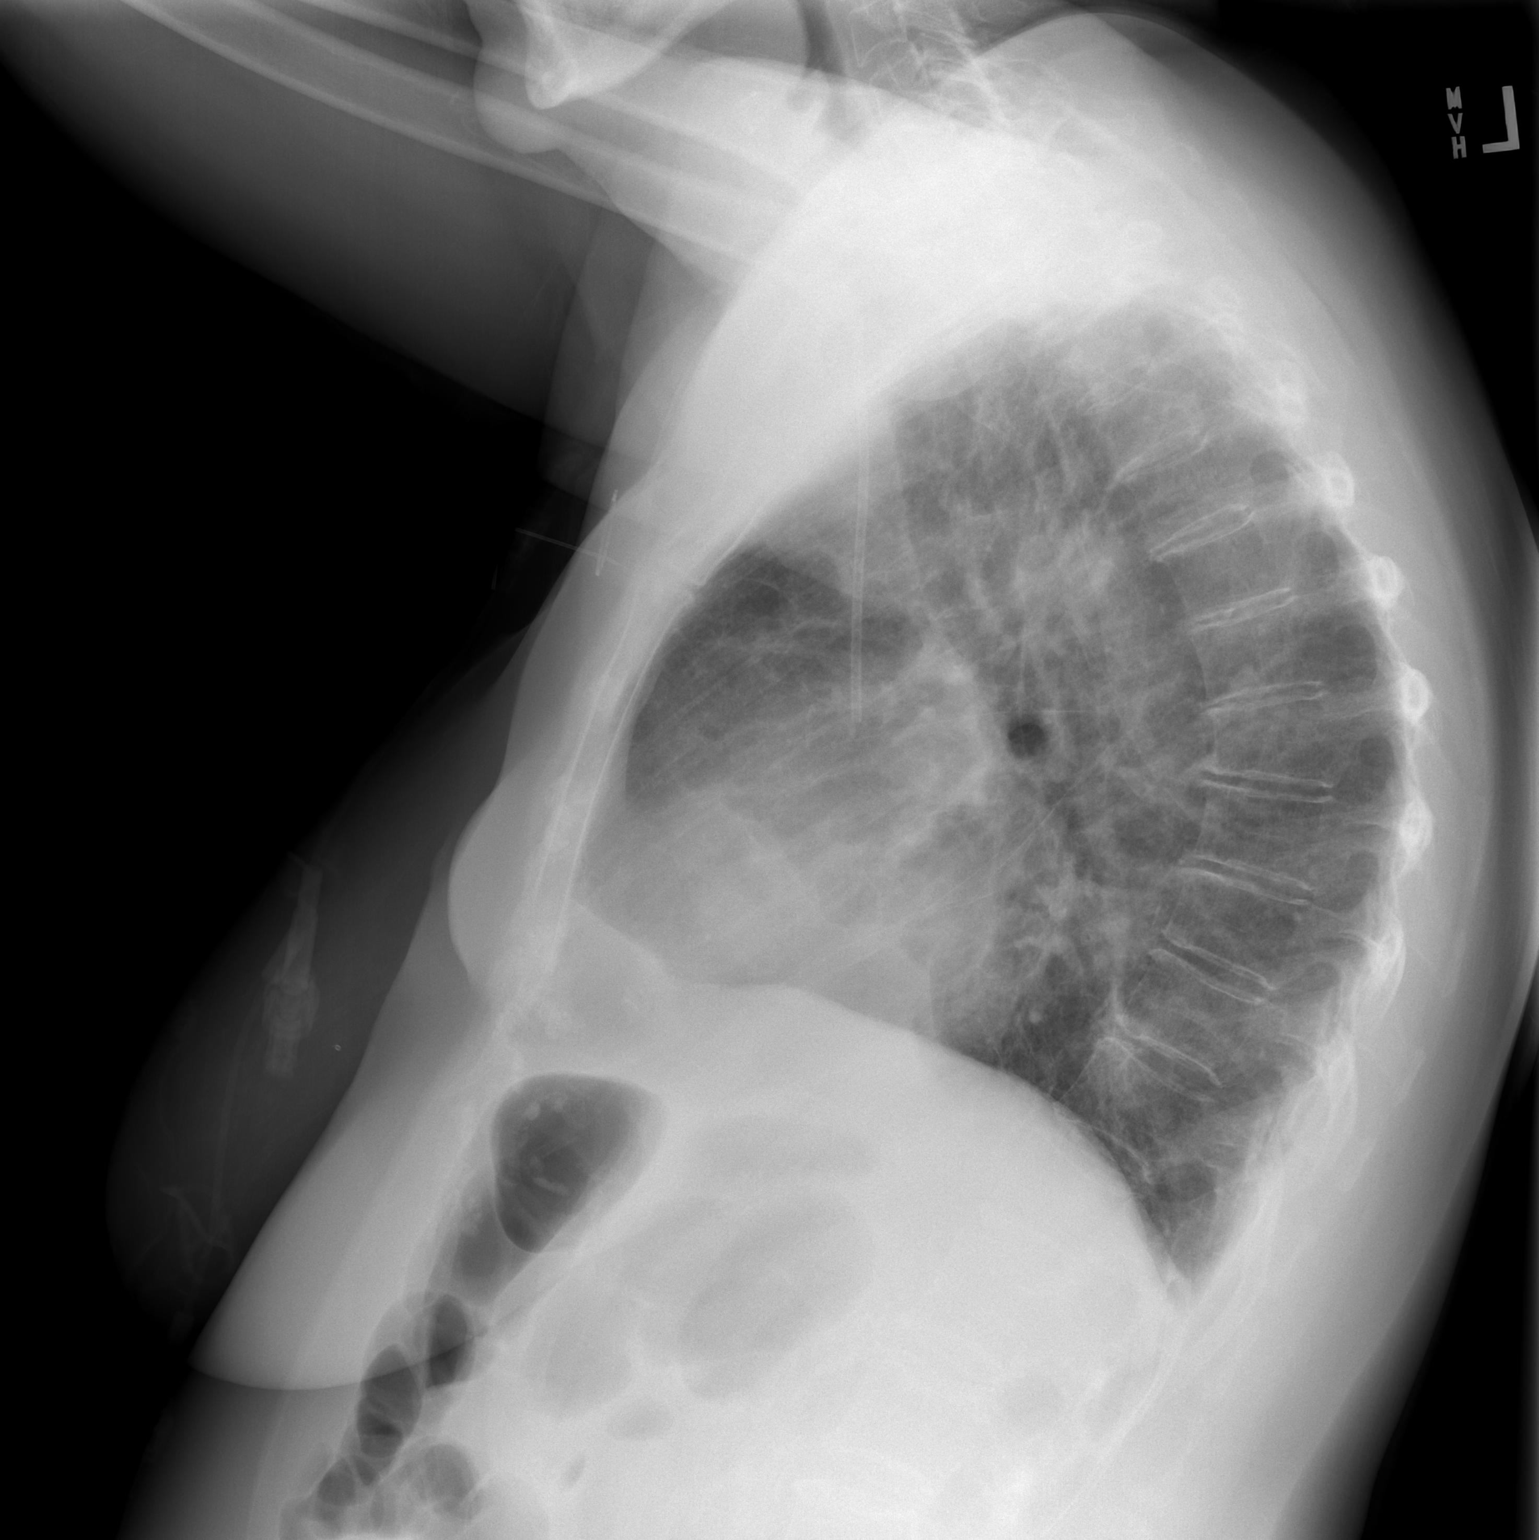

[2 of 2 positions shown; findings below may reference images not displayed]

FINDINGS: PowerPort catheter with tip over superior vena cava. Heart size
normal. Mild bibasilar atelectasis again noted. No pleural effusion
or pneumothorax. Diffuse thoracic spine osteopenia degenerative
change. Mild stable midthoracic vertebral body compression fracture.
IMPRESSION: 1.  PowerPort catheter with tip over superior vena cava.

2.  Low lung volumes with mild bibasilar atelectasis again noted.

## 2020-05-11 DIAGNOSIS — R0600 Dyspnea, unspecified: Secondary | ICD-10-CM | POA: Diagnosis not present

## 2020-05-11 DIAGNOSIS — J449 Chronic obstructive pulmonary disease, unspecified: Secondary | ICD-10-CM | POA: Diagnosis not present

## 2020-05-11 DIAGNOSIS — E782 Mixed hyperlipidemia: Secondary | ICD-10-CM | POA: Diagnosis not present

## 2020-05-11 DIAGNOSIS — I10 Essential (primary) hypertension: Secondary | ICD-10-CM | POA: Diagnosis not present

## 2020-05-15 DIAGNOSIS — Z23 Encounter for immunization: Secondary | ICD-10-CM | POA: Diagnosis not present

## 2020-05-28 NOTE — Progress Notes (Deleted)
Cardiology Clinic Note   Patient Name: Dorothy Frederick Date of Encounter: 05/28/2020  Primary Care Provider:  [name], MD Primary Cardiologist:  Skeet Latch, MD  Patient Profile    ***  Past Medical History    Past Medical History:  Diagnosis Date  . Angina at rest [facility])   . Anxiety   . Cancer [facility]) 2000   left breast cancer-partial mastectomy  . COPD (chronic obstructive pulmonary disease) ([place])   . Depression   . Family history of breast cancer   . Family history of esophageal cancer   . Family history of lung cancer   . Family history of prostate cancer   . GERD (gastroesophageal reflux disease)   . Hyperlipidemia   . Hypertension   . Hypothyroidism   . Recurrent breast cancer, left [facility])    Past Surgical History:  Procedure Laterality Date  . ADENOIDECTOMY    . APPLICATION OF WOUND VAC Left 04/01/2019   Procedure: Placement of provena wound vac;  Surgeon: Wallace Going, DO;  Location: [place];  Service: Plastics;  Laterality: Left;  . CARDIAC CATHETERIZATION    . DEBRIDEMENT AND CLOSURE WOUND Left 04/01/2019   Procedure: Left breast wound excision and closure;  Surgeon: Wallace Going, DO;  Location: [place];  Service: Plastics;  Laterality: Left;  . LEFT HEART CATHETERIZATION WITH CORONARY ANGIOGRAM N/A 10/21/2013   Procedure: LEFT HEART CATHETERIZATION WITH CORONARY ANGIOGRAM;  Surgeon: Burnell Blanks, MD;  Location: [facility] CATH LAB;  Service: Cardiovascular;  Laterality: N/A;  . MASTECTOMY W/ SENTINEL NODE BIOPSY Left 09/16/2018   Procedure: LEFT TOTAL MASTECTOMY WITH LEFT AXILLARY DEEP SENTINEL LYMPH NODE BIOPSY AND INJECT BLUE DYE;  Surgeon: Fanny Skates, MD;  Location: [place];  Service: General;  Laterality: Left;  Marland Kitchen MASTECTOMY, PARTIAL Left 2000  . PORT-A-CATH REMOVAL Right 10/20/2019   Procedure: REMOVAL PORT-A-CATH;  Surgeon: Coralie Keens, MD;  Location: [place];  Service: General;  Laterality: Right;  . PORTACATH PLACEMENT Right 09/16/2018   Procedure: INSERTION PORT-A-CATH;  Surgeon: Fanny Skates, MD;  Location: [place];  Service: General;  Laterality: Right;  . TONSILLECTOMY      Allergies  Allergies  Allergen Reactions  . Benadryl [Diphenhydramine Hcl (Sleep)] Palpitations    Increased heartrate  . Sulfa Antibiotics Rash    History of Present Illness    ***  Home Medications    Prior to Admission medications   Medication Sig Start Date End Date Taking? Authorizing Provider  albuterol (PROVENTIL HFA;VENTOLIN HFA) 108 (90 BASE) MCG/ACT inhaler Inhale 2 puffs into the lungs every 6 (six) hours as needed for wheezing or shortness of breath.    [provider]  atorvastatin (LIPITOR) 20 MG tablet Take 20 mg by mouth at bedtime. 03/08/19   [provider]  Cholecalciferol 1000 UNITS capsule Take 500 Units by mouth daily.     [provider]  escitalopram (LEXAPRO) 10 MG tablet Take 10 mg by mouth daily. 09/29/17   [provider]  Fluticasone-Umeclidin-Vilant (TRELEGY ELLIPTA) 100-62.5-25 MCG/INH AEPB Inhale 1 puff into the lungs daily as needed.    [provider]  furosemide (LASIX) 20 MG tablet TAKE ONE TABLET AS NEEDED FOR WEIGHT GAIN OF 2 POUNDS IN 24 HOURS OR 5 POUNDS IN 7 DAYS 11/17/19   Skeet Latch, MD  levothyroxine (SYNTHROID) 75 MCG tablet Take by mouth. 10/19/19   [provider]  levothyroxine (SYNTHROID, [place])  50 MCG tablet Take 1 tablet by mouth every other day. Alternate 50 mcg and 75 mcg every other day 09/29/17   [provider]  olmesartan (BENICAR) 20 MG tablet Take 20 mg by mouth daily.    [provider]    Family History    Family History  Problem Relation Age of Onset  . Heart disease Mother   . Hypertension Father   . Cancer Father   . Prostate cancer Father   . Hypertension Sister   . Breast  cancer Sister        dx 20s  . Heart disease Sister   . Hypertension Brother   . Hyperlipidemia Maternal Grandmother   . Stroke Maternal Grandmother   . Hypertension Brother   . Breast cancer Maternal Aunt   . Lung cancer Maternal Aunt   . Esophageal cancer Maternal Aunt   . Stomach cancer Paternal Grandmother    She indicated that her mother is deceased. She indicated that her father is deceased. She indicated that only one of her two sisters is alive. She indicated that both of her brothers are alive. She indicated that her maternal grandmother is deceased. She indicated that her maternal grandfather is deceased. She indicated that her paternal grandmother is deceased. She indicated that her paternal grandfather is deceased. She indicated that her daughter is alive.  Social History    Social History   Socioeconomic History  . Marital status: Not on file    Spouse name: Not on file  . Number of children: Not on file  . Years of education: Not on file  . Highest education level: Not on file  Occupational History  . Not on file  Tobacco Use  . Smoking status: Former Smoker    Packs/day: 1.00    Years: 38.00    Pack years: 38.00    Types: Cigarettes  . Smokeless tobacco: Never Used  Substance and Sexual Activity  . Alcohol use: No  . Drug use: No  . Sexual activity: Not Currently    Birth control/protection: Post-menopausal  Other Topics Concern  . Not on file  Social History Narrative   ** Merged History Encounter **       Social Determinants of Health   Financial Resource Strain: Not on file  Food Insecurity: Not on file  Transportation Needs: Not on file  Physical Activity: Not on file  Stress: Not on file  Social Connections: Not on file  Intimate Partner Violence: Not on file     Review of Systems    General:  No chills, fever, night sweats or weight changes.  Cardiovascular:  No chest pain, dyspnea on exertion, edema, orthopnea, palpitations, paroxysmal  nocturnal dyspnea. Dermatological: No rash, lesions/masses Respiratory: No cough, dyspnea Urologic: No hematuria, dysuria Abdominal:   No nausea, vomiting, diarrhea, bright red blood per rectum, melena, or hematemesis Neurologic:  No visual changes, wkns, changes in mental status. All other systems reviewed and are otherwise negative except as noted above.  Physical Exam    VS:  There were no vitals taken for this visit. , BMI There is no height or weight on file to calculate BMI. GEN: Well nourished, well developed, in no acute distress. HEENT: normal. Neck: Supple, no JVD, carotid bruits, or masses. Cardiac: RRR, no murmurs, rubs, or gallops. No clubbing, cyanosis, edema.  Radials/DP/PT 2+ and equal bilaterally.  Respiratory:  Respirations regular and unlabored, clear to auscultation bilaterally. GI: Soft, nontender, nondistended, BS + x 4. MS: no  deformity or atrophy. Skin: warm and dry, no rash. Neuro:  Strength and sensation are intact. Psych: Normal affect.  Accessory Clinical Findings    Recent Labs: 04/04/2020: ALT 17; BUN 19; Creatinine, Ser 1.04; Hemoglobin 13.1; Platelets 273; Potassium 3.8; Sodium 139   Recent Lipid Panel No results found for: CHOL, TRIG, HDL, CHOLHDL, VLDL, LDLCALC, LDLDIRECT  ECG personally reviewed by me today- *** - No acute changes  Assessment & Plan   1.  ***   Jossie Ng. Jaxton Casale NP-C    05/28/2020, 3:16 PM [facility] [address] Office [phone] Fax [phone]  Notice: This dictation was prepared with Dragon dictation along with smaller phrase technology. Any transcriptional errors that result from this process are unintentional and may not be corrected upon review.  I spent***minutes examining this patient, reviewing medications, and using patient centered shared decision making involving her cardiac care.  Prior to her visit I spent greater than 20 minutes reviewing her past medical history,   medications, and prior cardiac tests.

## 2020-05-31 ENCOUNTER — Ambulatory Visit: Payer: BC Managed Care – PPO | Admitting: General Practice

## 2020-06-01 DIAGNOSIS — I10 Essential (primary) hypertension: Secondary | ICD-10-CM | POA: Diagnosis not present

## 2020-06-01 DIAGNOSIS — E039 Hypothyroidism, unspecified: Secondary | ICD-10-CM | POA: Diagnosis not present

## 2020-06-01 DIAGNOSIS — E782 Mixed hyperlipidemia: Secondary | ICD-10-CM | POA: Diagnosis not present

## 2020-06-05 DIAGNOSIS — E782 Mixed hyperlipidemia: Secondary | ICD-10-CM | POA: Diagnosis not present

## 2020-06-05 DIAGNOSIS — J449 Chronic obstructive pulmonary disease, unspecified: Secondary | ICD-10-CM | POA: Diagnosis not present

## 2020-06-05 DIAGNOSIS — I10 Essential (primary) hypertension: Secondary | ICD-10-CM | POA: Diagnosis not present

## 2020-06-05 DIAGNOSIS — E039 Hypothyroidism, unspecified: Secondary | ICD-10-CM | POA: Diagnosis not present

## 2020-07-20 DIAGNOSIS — Z1231 Encounter for screening mammogram for malignant neoplasm of breast: Secondary | ICD-10-CM | POA: Diagnosis not present

## 2020-09-08 ENCOUNTER — Emergency Department (HOSPITAL_COMMUNITY)
Admission: EM | Admit: 2020-09-08 | Discharge: 2020-09-08 | Disposition: A | Discharge status: Home / Self Care | Payer: BC Managed Care – PPO | Attending: Emergency Medicine | Admitting: Emergency Medicine

## 2020-09-08 ENCOUNTER — Emergency Department (HOSPITAL_COMMUNITY): Payer: BC Managed Care – PPO

## 2020-09-08 ENCOUNTER — Other Ambulatory Visit: Payer: Self-pay

## 2020-09-08 DIAGNOSIS — J9811 Atelectasis: Secondary | ICD-10-CM | POA: Diagnosis not present

## 2020-09-08 DIAGNOSIS — R1011 Right upper quadrant pain: Secondary | ICD-10-CM | POA: Diagnosis not present

## 2020-09-08 DIAGNOSIS — R0602 Shortness of breath: Secondary | ICD-10-CM | POA: Diagnosis not present

## 2020-09-08 DIAGNOSIS — Z7951 Long term (current) use of inhaled steroids: Secondary | ICD-10-CM | POA: Diagnosis not present

## 2020-09-08 DIAGNOSIS — J439 Emphysema, unspecified: Secondary | ICD-10-CM | POA: Diagnosis not present

## 2020-09-08 DIAGNOSIS — R079 Chest pain, unspecified: Secondary | ICD-10-CM | POA: Diagnosis not present

## 2020-09-08 DIAGNOSIS — Z87891 Personal history of nicotine dependence: Secondary | ICD-10-CM | POA: Insufficient documentation

## 2020-09-08 DIAGNOSIS — I10 Essential (primary) hypertension: Secondary | ICD-10-CM | POA: Diagnosis not present

## 2020-09-08 DIAGNOSIS — Z853 Personal history of malignant neoplasm of breast: Secondary | ICD-10-CM | POA: Insufficient documentation

## 2020-09-08 DIAGNOSIS — J90 Pleural effusion, not elsewhere classified: Secondary | ICD-10-CM | POA: Diagnosis not present

## 2020-09-08 DIAGNOSIS — E039 Hypothyroidism, unspecified: Secondary | ICD-10-CM | POA: Diagnosis not present

## 2020-09-08 DIAGNOSIS — R071 Chest pain on breathing: Secondary | ICD-10-CM | POA: Diagnosis not present

## 2020-09-08 DIAGNOSIS — Z79899 Other long term (current) drug therapy: Secondary | ICD-10-CM | POA: Diagnosis not present

## 2020-09-08 DIAGNOSIS — K760 Fatty (change of) liver, not elsewhere classified: Secondary | ICD-10-CM | POA: Diagnosis not present

## 2020-09-08 DIAGNOSIS — K219 Gastro-esophageal reflux disease without esophagitis: Secondary | ICD-10-CM | POA: Insufficient documentation

## 2020-09-08 DIAGNOSIS — Z955 Presence of coronary angioplasty implant and graft: Secondary | ICD-10-CM | POA: Insufficient documentation

## 2020-09-08 DIAGNOSIS — I2510 Atherosclerotic heart disease of native coronary artery without angina pectoris: Secondary | ICD-10-CM | POA: Insufficient documentation

## 2020-09-08 DIAGNOSIS — K449 Diaphragmatic hernia without obstruction or gangrene: Secondary | ICD-10-CM | POA: Diagnosis not present

## 2020-09-08 DIAGNOSIS — J449 Chronic obstructive pulmonary disease, unspecified: Secondary | ICD-10-CM | POA: Insufficient documentation

## 2020-09-08 LAB — COMPREHENSIVE METABOLIC PANEL
ALT: 19 U/L (ref 0–44)
AST: 24 U/L (ref 15–41)
Albumin: 4 g/dL (ref 3.5–5.0)
Alkaline Phosphatase: 109 U/L (ref 38–126)
Anion gap: 13 (ref 5–15)
BUN: 16 mg/dL (ref 8–23)
CO2: 19 mmol/L — ABNORMAL LOW (ref 22–32)
Calcium: 9.2 mg/dL (ref 8.9–10.3)
Chloride: 105 mmol/L (ref 98–111)
Creatinine, Ser: 1.23 mg/dL — ABNORMAL HIGH (ref 0.44–1.00)
GFR, Estimated: 49 mL/min — ABNORMAL LOW (ref >60)
Glucose, Bld: 143 mg/dL — ABNORMAL HIGH (ref 70–99)
Potassium: 4.2 mmol/L (ref 3.5–5.1)
Sodium: 137 mmol/L (ref 135–145)
Total Bilirubin: 0.8 mg/dL (ref 0.3–1.2)
Total Protein: 7.7 g/dL (ref 6.5–8.1)

## 2020-09-08 LAB — CBC WITH DIFFERENTIAL/PLATELET
Abs Immature Granulocytes: 0.05 10*3/uL (ref 0.00–0.07)
Basophils Absolute: 0.1 10*3/uL (ref 0.0–0.1)
Basophils Relative: 1 %
Eosinophils Absolute: 0.2 10*3/uL (ref 0.0–0.5)
Eosinophils Relative: 2 %
HCT: 42.9 % (ref 36.0–46.0)
Hemoglobin: 13.4 g/dL (ref 12.0–15.0)
Immature Granulocytes: 1 %
Lymphocytes Relative: 29 %
Lymphs Abs: 2 10*3/uL (ref 0.7–4.0)
MCH: 28.9 pg (ref 26.0–34.0)
MCHC: 31.2 g/dL (ref 30.0–36.0)
MCV: 92.5 fL (ref 80.0–100.0)
Monocytes Absolute: 0.5 10*3/uL (ref 0.1–1.0)
Monocytes Relative: 7 %
Neutro Abs: 4 10*3/uL (ref 1.7–7.7)
Neutrophils Relative %: 60 %
Platelets: 409 10*3/uL — ABNORMAL HIGH (ref 150–400)
RBC: 4.64 MIL/uL (ref 3.87–5.11)
RDW: 13.3 % (ref 11.5–15.5)
WBC: 6.7 10*3/uL (ref 4.0–10.5)
nRBC: 0 % (ref 0.0–0.2)

## 2020-09-08 LAB — LIPASE, BLOOD: Lipase: 39 U/L (ref 11–51)

## 2020-09-08 LAB — D-DIMER, QUANTITATIVE: D-Dimer, Quant: 1.57 ug/mL-FEU — ABNORMAL HIGH (ref 0.00–0.50)

## 2020-09-08 LAB — TROPONIN I (HIGH SENSITIVITY)
Troponin I (High Sensitivity): 5 ng/L (ref <18)
Troponin I (High Sensitivity): 3 ng/L (ref <18)

## 2020-09-08 MED ORDER — KETOROLAC TROMETHAMINE 15 MG/ML IJ SOLN
15.0000 mg | Freq: Once | INTRAMUSCULAR | Status: AC
  Administered 2020-09-08: 15 mg via INTRAVENOUS
  Filled 2020-09-08: qty 1

## 2020-09-08 MED ORDER — IOHEXOL 350 MG/ML SOLN
50.0000 mL | Freq: Once | INTRAVENOUS | Status: AC | PRN
  Administered 2020-09-08: 50 mL via INTRAVENOUS

## 2020-09-08 MED ORDER — FENTANYL CITRATE PF 50 MCG/ML IJ SOSY
50.0000 ug | PREFILLED_SYRINGE | Freq: Once | INTRAMUSCULAR | Status: AC
  Administered 2020-09-08: 50 ug via INTRAVENOUS
  Filled 2020-09-08: qty 1

## 2020-09-08 MED ORDER — OXYCODONE-ACETAMINOPHEN 5-325 MG PO TABS: 1.0000 | ORAL_TABLET | Freq: Four times a day (QID) | ORAL | 0 refills | Status: DC | PRN

## 2020-09-08 NOTE — ED Provider Notes (Signed)
Emergency Medicine Provider Triage Evaluation Note  Dorothy Frederick , a 65 y.o. female  was evaluated in triage.  Pt complains of right sided pain.  States she is not sure if it is more her lung or her upper abdomen.  States she just has severe pain in right side that radiates into the back and across upper abdomen.  Does report pain with deep breathing.  Does have history of breast CA-- finished chemo September 2021.  No reported history of DVT or PE.  Review of Systems  Positive: Right sided pain Negative: Fever, chills  Physical Exam  BP 110/77   Pulse (!) 120   Temp (!) 97.5 F (36.4 C) (Oral)   Resp (!) 24   SpO2 98%   Gen:   Awake, appears uncomfortable Resp:  Normal effort  MSK:   Moves extremities without difficulty  Other:  Tenderness in RUQ and epigastric area  Medical Decision Making  Medically screening exam initiated at 4:43 AM.  Appropriate orders placed.  Dorothy Frederick was informed that the remainder of the evaluation will be completed by another provider, this initial triage assessment does not replace that evaluation, and the importance of remaining in the ED until their evaluation is complete.  Consider abdominal pathology along with cardiac including PE given cancer history.  Labs sent, CXR and [place] ordered.   Dorothy Pickett, PA-C 09/08/20 0448    Dorothy Speak, MD 09/08/20 [phone]

## 2020-09-08 NOTE — Discharge Instructions (Signed)
Your CT scan showed a right-sided pleural effusion, as well as a lung nodule.  Please follow-up with your primary care doctor and a pulmonologist regarding these findings to have repeat imaging.  There was no evidence of a blood clot.  Your ultrasound of your gallbladder and your blood work were reassuring.  Please take ibuprofen and Tylenol as needed for pain at home.  I have prescribed a short course of pain medication to help you for the next few days as they can be the most painful.

## 2020-09-08 NOTE — ED Notes (Signed)
Patient transported to Ultrasound 

## 2020-09-08 NOTE — ED Triage Notes (Signed)
Patient with diffuse abdominal pain and right lung pain.  Patient with shortness of breath.  Patient states she finished chemo for Breast CA in September.  Patient is obviously uncomfortable.  Patient denies any nausea, vomiting or diarrhea.

## 2020-09-08 NOTE — ED Provider Notes (Signed)
[facility] EMERGENCY DEPARTMENT Provider Note   CSN: [id number] Arrival date & time: 09/08/20  0432     History Chief Complaint  Patient presents with   Abdominal Pain    Dorothy Frederick is a 65 y.o. female.   Abdominal Pain Associated symptoms: chest pain and shortness of breath   Associated symptoms: no chills, no cough, no dysuria, no fever, no hematuria, no nausea, no sore throat and no vomiting    65 year old female with a past medical history of breast cancer undergoing active chemotherapy, hypertension presenting to the emergency department with right-sided chest pain.  It has been present for the past several days.  It is constant.  It is pleuritic in nature.  It is associated with shortness of breath with exertion.  No history of similar symptoms.  She denies any fever, cough.  She denies any leg swelling.  No history of PE or DVT.  Past Medical History:  Diagnosis Date   Angina at rest [facility])    Anxiety    Cancer ([place]) 2000   left breast cancer-partial mastectomy   COPD (chronic obstructive pulmonary disease) ([place])    Depression    Family history of breast cancer    Family history of esophageal cancer    Family history of lung cancer    Family history of prostate cancer    GERD (gastroesophageal reflux disease)    Hyperlipidemia    Hypertension    Hypothyroidism    Recurrent breast cancer, left [facility])     Patient Active Problem List   Diagnosis Date Noted   Acquired absence of left breast 10/15/2019   Port-A-Cath in place 11/05/2018   Pre-operative clearance 08/10/2018   Genetic testing 07/30/2018   Family history of breast cancer    Family history of prostate cancer    Family history of lung cancer    Family history of esophageal cancer    Malignant neoplasm of upper-outer quadrant of left breast in female, estrogen receptor negative ([place]) 07/15/2018   CAD due to calcified coronary lesion 07/30/2014   HTN (hypertension) 11/08/2013   S/P  cardiac cath 11/08/2013   Abnormal nuclear stress test 10/16/2013   Abnormal ECG 09/15/2013   History of breast cancer, left, status post partial mastectomy and XRT 09/15/2013   Hypercholesterolemia 09/15/2013   History of tobacco use 09/15/2013   Exertional dyspnea 09/15/2013    Past Surgical History:  Procedure Laterality Date   ADENOIDECTOMY     APPLICATION OF WOUND VAC Left 04/01/2019   Procedure: Placement of provena wound vac;  Surgeon: Wallace Going, DO;  Location: [place];  Service: Plastics;  Laterality: Left;   CARDIAC CATHETERIZATION     DEBRIDEMENT AND CLOSURE WOUND Left 04/01/2019   Procedure: Left breast wound excision and closure;  Surgeon: Wallace Going, DO;  Location: [place];  Service: Plastics;  Laterality: Left;   LEFT HEART CATHETERIZATION WITH CORONARY ANGIOGRAM N/A 10/21/2013   Procedure: LEFT HEART CATHETERIZATION WITH CORONARY ANGIOGRAM;  Surgeon: Burnell Blanks, MD;  Location: [facility] CATH LAB;  Service: Cardiovascular;  Laterality: N/A;   MASTECTOMY W/ SENTINEL NODE BIOPSY Left 09/16/2018   Procedure: LEFT TOTAL MASTECTOMY WITH LEFT AXILLARY DEEP SENTINEL LYMPH NODE BIOPSY AND INJECT BLUE DYE;  Surgeon: Fanny Skates, MD;  Location: [place];  Service: General;  Laterality: Left;   MASTECTOMY, PARTIAL Left 2000   PORT-A-CATH REMOVAL Right 10/20/2019   Procedure: REMOVAL PORT-A-CATH;  Surgeon: Coralie Keens, MD;  Location: [place];  Service: General;  Laterality: Right;   PORTACATH PLACEMENT Right 09/16/2018   Procedure: INSERTION PORT-A-CATH;  Surgeon: Fanny Skates, MD;  Location: [place];  Service: General;  Laterality: Right;   TONSILLECTOMY       OB History   No obstetric history on file.     Family History  Problem Relation Age of Onset   Heart disease Mother    Hypertension Father    Cancer Father    Prostate cancer Father    Hypertension  Sister    Breast cancer Sister        dx 60s   Heart disease Sister    Hypertension Brother    Hyperlipidemia Maternal Grandmother    Stroke Maternal Grandmother    Hypertension Brother    Breast cancer Maternal Aunt    Lung cancer Maternal Aunt    Esophageal cancer Maternal Aunt    Stomach cancer Paternal Grandmother     Social History   Tobacco Use   Smoking status: Former    Packs/day: 1.00    Years: 38.00    Pack years: 38.00    Types: Cigarettes   Smokeless tobacco: Never  Substance Use Topics   Alcohol use: No   Drug use: No    Home Medications Prior to Admission medications   Medication Sig Start Date End Date Taking? Authorizing Provider  albuterol (PROVENTIL HFA;VENTOLIN HFA) 108 (90 BASE) MCG/ACT inhaler Inhale 2 puffs into the lungs every 6 (six) hours as needed for wheezing or shortness of breath.   Yes [provider]  atorvastatin (LIPITOR) 20 MG tablet Take 20 mg by mouth at bedtime. 03/08/19  Yes [provider]  Cholecalciferol 125 MCG (5000 UT) TABS Take 5,000 Units by mouth daily.   Yes [provider]  escitalopram (LEXAPRO) 10 MG tablet Take 10 mg by mouth daily. 09/29/17  Yes [provider]  Fluticasone-Umeclidin-Vilant (TRELEGY ELLIPTA) 100-62.5-25 MCG/INH AEPB Inhale 1 puff into the lungs daily.   Yes [provider]  furosemide (LASIX) 20 MG tablet TAKE ONE TABLET AS NEEDED FOR WEIGHT GAIN OF 2 POUNDS IN 24 HOURS OR 5 POUNDS IN 7 DAYS Patient taking differently: Take 20 mg by mouth See admin instructions. 1 tab as needed for weight gain of 2 pounds in 24 hours or 5 pounds in 7 days 11/17/19  Yes Skeet Latch, MD  ibuprofen (ADVIL) 200 MG tablet Take 400 mg by mouth every 6 (six) hours as needed for headache or moderate pain.   Yes [provider]  levothyroxine (SYNTHROID) 75 MCG tablet Take 75 mcg by mouth See admin instructions. Alternate every other day with 50 mcg 10/19/19  Yes [provider]  levothyroxine (SYNTHROID, LEVOTHROID) 50 MCG tablet Take 1 tablet by mouth every other day. Alternate every other day  with 75 mcg 09/29/17  Yes [provider]  olmesartan (BENICAR) 20 MG tablet Take 20 mg by mouth daily.   Yes [provider]    Allergies    Benadryl [diphenhydramine hcl (sleep)] and Sulfa antibiotics  Review of Systems   Review of Systems  Constitutional:  Negative for chills and fever.  HENT:  Negative for ear pain and sore throat.   Eyes:  Negative for pain and visual disturbance.  Respiratory:  Positive for shortness of breath. Negative for cough.   Cardiovascular:  Positive for chest pain. Negative for palpitations.  Gastrointestinal:  Negative for abdominal pain, nausea and vomiting.  Genitourinary:  Negative for dysuria and hematuria.  Musculoskeletal:  Negative for arthralgias and back pain.  Skin:  Negative for color change and rash.  Neurological:  Negative for seizures and syncope.  All other systems reviewed and are negative.  Physical Exam Updated Vital Signs BP (!) 99/51   Pulse 63   Temp (!) 97.5 F (36.4 C) (Oral)   Resp (!) 27   SpO2 98%   Physical Exam Vitals and nursing note reviewed.  Constitutional:      General: She is not in acute distress.    Appearance: She is well-developed. She is not toxic-appearing.     Comments: Somewhat uncomfortable appearing, holding her right side  HENT:     Head: Normocephalic and atraumatic.  Eyes:     Conjunctiva/sclera: Conjunctivae normal.  Cardiovascular:     Rate and Rhythm: Normal rate and regular rhythm.     Heart sounds: No murmur heard. Pulmonary:     Effort: Pulmonary effort is normal. No respiratory distress.     Breath sounds: Normal breath sounds.  Abdominal:     Palpations: Abdomen is soft.     Tenderness: There is abdominal tenderness in the right upper quadrant. There is no right CVA tenderness, left CVA tenderness, guarding or rebound. Negative  signs include Murphy's sign and McBurney's sign.  Musculoskeletal:     Cervical back: Neck supple.  Skin:    General: Skin is warm and dry.     Capillary Refill: Capillary refill takes less than 2 seconds.  Neurological:     Mental Status: She is alert and oriented to person, place, and time.    ED Results / Procedures / Treatments   Labs (all labs ordered are listed, but only abnormal results are displayed) Labs Reviewed  CBC WITH DIFFERENTIAL/PLATELET - Abnormal; Notable for the following components:      Result Value   Platelets 409 (*)    All other components within normal limits  COMPREHENSIVE METABOLIC PANEL - Abnormal; Notable for the following components:   CO2 19 (*)    Glucose, Bld 143 (*)    Creatinine, Ser 1.23 (*)    GFR, Estimated 49 (*)    All other components within normal limits  D-DIMER, QUANTITATIVE - Abnormal; Notable for the following components:   D-Dimer, Quant 1.57 (*)    All other components within normal limits  LIPASE, BLOOD  TROPONIN I (HIGH SENSITIVITY)  TROPONIN I (HIGH SENSITIVITY)    EKG EKG Interpretation  Date/Time:  Friday September 08 2020 04:44:58 EDT Ventricular Rate:  116 PR Interval:  144 QRS Duration: 74 QT Interval:  334 QTC Calculation: 464 R Axis:   91 Text Interpretation: Sinus tachycardia Rightward axis Borderline ECG Confirmed by Veryl Speak [phone]) on 09/08/2020 6:22:42 AM  Radiology DG Chest Port 1 View  Result Date: 09/08/2020 CLINICAL DATA:  Right-sided chest pain EXAM: PORTABLE CHEST 1 VIEW COMPARISON:  Chest CT 02/18/2020 FINDINGS: Stable heart size and mediastinal contours with distortion from rightward rotation. Emphysema. Mild atelectasis suspected at the bases. There is no edema, consolidation, effusion, or pneumothorax. IMPRESSION: Emphysema and mild atelectasis. Electronically Signed   By: Monte Fantasia M.D.   On: 09/08/2020 05:37   US Abdomen Limited RUQ (LIVER/GB)  Result Date: 09/08/2020 CLINICAL DATA:   Right upper quadrant pain. EXAM: ULTRASOUND ABDOMEN LIMITED RIGHT UPPER QUADRANT COMPARISON:  None. FINDINGS: Gallbladder: No gallstones or wall thickening visualized. No sonographic Murphy sign noted by sonographer. Common bile duct: Diameter: 4 mm, normal. Liver:  No focal lesion identified. Increased in parenchymal echogenicity. Portal vein is patent on color Doppler imaging with normal direction of blood flow towards the liver. Other: None. IMPRESSION: 1. No acute abnormality. 2. Hepatic steatosis. Electronically Signed   By: Titus Dubin M.D.   On: 09/08/2020 06:26    Procedures Procedures   Medications Ordered in ED Medications - No data to display  ED Course  I have reviewed the triage vital signs and the nursing notes.  Pertinent labs & imaging results that were available during my care of the patient were reviewed by me and considered in my medical decision making (see chart for details).    MDM Rules/Calculators/A&P                           65 year old female with recurrent cancer on active chemotherapy presenting with pleuritic right-sided chest pain.  It is associated with exertional shortness of breath as well.  Vital signs reviewed, on arrival the patient was tachycardic but this improved during her ED stay.  Differential includes ACS, PE, pneumonia, pneumothorax, acute hepatitis, biliary disease.  Labs obtained in triage reviewed.  No leukocytosis.  Creatinine is roughly the patient's baseline, mildly decreased bicarb of 19.  Negative initial troponin.  EKG appears nonischemic.  Chest x-ray negative for pneumonia or pneumothorax.  Right upper quadrant ultrasound negative for signs of cholelithiasis or cholecystitis.  She did have an elevated D-dimer.  Given her constellation of symptoms and past medical history, will obtain a CTA PE study.  On reevaluation, patient's pain managed well with analgesics here in the ED.  CTA PE study negative for pulmonary embolism, but showed a  small right-sided pleural effusion and a lung nodule.  Outpatient short-term follow-up recommended for the lung nodule, which I have communicated to the patient and her daughter at bedside and verbal and written format.  I believe that a right-sided pleural effusion could explain the patient's symptoms.  The exact etiology of this is unknown, could be infectious or partially malignant but it is too small to sample.  Given that her pain is well controlled, her respiratory status is normal, and the aforementioned negative work-up, believe that she is stable for discharge from the emergency department at this time.  Will provide resources for pulmonology follow-up and encouraged close primary care follow-up as well.  Patient and daughter comfortable with that plan, strict return precautions discussed.  Final Clinical Impression(s) / ED Diagnoses Final diagnoses:  RUQ pain    Rx / DC Orders ED Discharge Orders     None        Claud Kelp, MD 09/08/20 [id number]    Lajean Saver, MD 09/08/20 1048

## 2020-09-13 ENCOUNTER — Ambulatory Visit (INDEPENDENT_AMBULATORY_CARE_PROVIDER_SITE_OTHER): Payer: BC Managed Care – PPO | Admitting: Pulmonary Disease

## 2020-09-13 ENCOUNTER — Encounter: Payer: Self-pay | Admitting: Pulmonary Disease

## 2020-09-13 ENCOUNTER — Other Ambulatory Visit: Payer: Self-pay

## 2020-09-13 VITALS — BP 116/60 | HR 88 | Temp 97.8°F | Ht 63.5 in | Wt 172.6 lb

## 2020-09-13 DIAGNOSIS — Z800 Family history of malignant neoplasm of digestive organs: Secondary | ICD-10-CM

## 2020-09-13 DIAGNOSIS — J432 Centrilobular emphysema: Secondary | ICD-10-CM

## 2020-09-13 DIAGNOSIS — Z803 Family history of malignant neoplasm of breast: Secondary | ICD-10-CM

## 2020-09-13 DIAGNOSIS — Z853 Personal history of malignant neoplasm of breast: Secondary | ICD-10-CM

## 2020-09-13 DIAGNOSIS — R911 Solitary pulmonary nodule: Secondary | ICD-10-CM | POA: Diagnosis not present

## 2020-09-13 DIAGNOSIS — Z87891 Personal history of nicotine dependence: Secondary | ICD-10-CM | POA: Diagnosis not present

## 2020-09-13 DIAGNOSIS — Z801 Family history of malignant neoplasm of trachea, bronchus and lung: Secondary | ICD-10-CM

## 2020-09-13 NOTE — Progress Notes (Signed)
Synopsis: Referred in August 2022 for lung nodule, pleural effusion, PCP: By [name], MD  Subjective:   PATIENT ID: Dorothy Frederick GENDER: female DOB: 1955/08/01, MRN: [id number]  Chief Complaint  Patient presents with   Consult    Pt states she was seen in the ED 8/18 due to right upper quadrant pain and also SOB.     This is a 65 year old female, history of left breast cancer, partial mastectomy in 2000, COPD, family history of breast cancer, esophageal cancer, lung cancer.  Patient has hyperlipidemia and hypertension.She was seen in the emergency department on 09/08/2020.  CT imaging in the emergency department was complete which revealed a 1.6 cm nodular opacity within the right middle lobe, moderate to severe emphysema and a small right-sided pleural effusion.  Patient was referred here for follow-up to discuss next steps.  As well as to establish care with pulmonologist.  OV 09/13/2020: Has no respiratory complaints today.  Was seen in the ER for right-sided chest pains.  From respiratory standpoint she feels okay.  Chest pains have resolved.  Reviewed CT imaging today in the office with the patient.  She is already enrolled in her lung cancer screening that was last completed in January.  She is a former smoker.  Oncology History  Malignant neoplasm of upper-outer quadrant of left breast in female, estrogen receptor negative ([place])  07/15/2018 Initial Diagnosis   Malignant neoplasm of upper-outer quadrant of left breast in female, estrogen receptor negative ([place])   07/29/2018 Genetic Testing   Genetic testing reported out on 07/29/2018 through the [facility] Common Hereditary cancer panel found no pathogenic mutations.   The Common Hereditary Cancers Panel offered by [facility] includes sequencing and/or deletion duplication testing of the following 47 genes: APC, ATM, AXIN2, BARD1, BMPR1A, BRCA1, BRCA2, BRIP1, CDH1, CDKN2A (p14ARF), CDKN2A (p16INK4a), CKD4, CHEK2, CTNNA1, DICER1, EPCAM  (Deletion/duplication testing only), GREM1 (promoter region deletion/duplication testing only), KIT, MEN1, MLH1, MSH2, MSH3, MSH6, MUTYH, NBN, NF1, NHTL1, PALB2, PDGFRA, PMS2, POLD1, POLE, PTEN, RAD50, RAD51C, RAD51D, SDHB, SDHC, SDHD, SMAD4, SMARCA4. STK11, TP53, TSC1, TSC2, and VHL.  The following genes were evaluated for sequence changes only: SDHA and HOXB13 c.251G>A variant only.   09/16/2018 Surgery   Left mastectomy [facility]) [phone]): invasive ductal carcinoma, grade 3, with high grade DCIS. Negative margins. 6 lymph nodes were negative for carcinoma. ER/PR negative. Her-2 positive. Ki67 70%.   10/21/2018 - 10/06/2019 Chemotherapy   PACLitaxel (TAXOL) 150 mg in sodium chloride 0.9 % 250 mL chemo infusion (</= 23m/m2), 80 mg/m2 = 150 mg, Intravenous,  Once, 2 of 2 cycles. Administration: 150 mg (10/21/2018), 150 mg (10/29/2018), 150 mg (11/19/2018), 150 mg (11/05/2018), 150 mg (11/12/2018), 150 mg (11/26/2018), 150 mg (12/10/2018), 150 mg (12/16/2018).  trastuzumab-dkst (OGIVRI) 300 mg in sodium chloride 0.9 % 250 mL chemo infusion, 315 mg, Intravenous,  Once, 16 of 16 cycles. Administration: 300 mg (10/21/2018), 150 mg (11/19/2018), 450 mg (12/24/2018), 450 mg (01/13/2019), 450 mg (03/17/2019), 450 mg (04/07/2019), 450 mg (06/09/2019), 450 mg (06/30/2019), 450 mg (09/08/2019), 150 mg (11/05/2018), 150 mg (11/12/2018), 150 mg (11/26/2018), 150 mg (12/10/2018), 150 mg (12/16/2018), 450 mg (02/03/2019), 450 mg (02/24/2019), 450 mg (04/28/2019), 450 mg (05/19/2019), 450 mg (07/28/2019), 450 mg (08/18/2019), 450 mg (10/06/2019).       Past Medical History:  Diagnosis Date   Angina at rest ([facility]    Anxiety    Cancer ([place]   left breast cancer-partial mastectomy   COPD (chronic obstructive pulmonary disease) [place]  Depression    Family history of breast cancer    Family history of esophageal cancer    Family history of lung cancer    Family history of prostate cancer    GERD (gastroesophageal reflux  disease)    Hyperlipidemia    Hypertension    Hypothyroidism    Recurrent breast cancer, left ([place])      Family History  Problem Relation Age of Onset   Heart disease Mother    Hypertension Father    Cancer Father    Prostate cancer Father    Hypertension Sister    Breast cancer Sister        dx 3s   Heart disease Sister    Hypertension Brother    Hyperlipidemia Maternal Grandmother    Stroke Maternal Grandmother    Hypertension Brother    Breast cancer Maternal Aunt    Lung cancer Maternal Aunt    Esophageal cancer Maternal Aunt    Stomach cancer Paternal Grandmother      Past Surgical History:  Procedure Laterality Date   ADENOIDECTOMY     APPLICATION OF WOUND VAC Left 04/01/2019   Procedure: Placement of provena wound vac;  Surgeon: Wallace Going, DO;  Location: [place];  Service: Plastics;  Laterality: Left;   CARDIAC CATHETERIZATION     DEBRIDEMENT AND CLOSURE WOUND Left 04/01/2019   Procedure: Left breast wound excision and closure;  Surgeon: Wallace Going, DO;  Location: [place];  Service: Plastics;  Laterality: Left;   LEFT HEART CATHETERIZATION WITH CORONARY ANGIOGRAM N/A 10/21/2013   Procedure: LEFT HEART CATHETERIZATION WITH CORONARY ANGIOGRAM;  Surgeon: Burnell Blanks, MD;  Location[facility];  Service: Cardiovascular;  Laterality: N/A;   MASTECTOMY W/ SENTINEL NODE BIOPSY Left 09/16/2018   Procedure: LEFT TOTAL MASTECTOMY WITH LEFT AXILLARY DEEP SENTINEL LYMPH NODE BIOPSY AND INJECT BLUE DYE;  Surgeon: Fanny Skates, MD;  Location: [place];  Service: General;  Laterality: Left;   MASTECTOMY, PARTIAL Left 2000   PORT-A-CATH REMOVAL Right 10/20/2019   Procedure: REMOVAL PORT-A-CATH;  Surgeon: Coralie Keens, MD;  Location: [place];  Service: General;  Laterality: Right;   PORTACATH PLACEMENT Right 09/16/2018   Procedure: INSERTION PORT-A-CATH;  Surgeon: Fanny Skates, MD;  Location: [place];  Service: General;  Laterality: Right;   TONSILLECTOMY      Social History   Socioeconomic History   Marital status: Single    Spouse name: Not on file   Number of children: Not on file   Years of education: Not on file   Highest education level: Not on file  Occupational History   Not on file  Tobacco Use   Smoking status: Former    Packs/day: 1.50    Years: 38.00    Pack years: 57.00    Types: Cigarettes    Start date: 28    Quit date: 01/21/2013    Years since quitting: 7.6   Smokeless tobacco: Never  Substance and Sexual Activity   Alcohol use: No   Drug use: No   Sexual activity: Not Currently    Birth control/protection: Post-menopausal  Other Topics Concern   Not on file  Social History Narrative   ** Merged History Encounter **       Social Determinants of Health   Financial Resource Strain: Not on file  Food Insecurity: Not on file  Transportation Needs: Not on file  Physical Activity: Not on file  Stress: Not on file  Social Connections: Not on file  Intimate Partner Violence: Not on file     Allergies  Allergen Reactions   Benadryl [Diphenhydramine Hcl (Sleep)] Palpitations    Increased heartrate   Sulfa Antibiotics Rash     Outpatient Medications Prior to Visit  Medication Sig Dispense Refill   albuterol (PROVENTIL HFA;VENTOLIN HFA) 108 (90 BASE) MCG/ACT inhaler Inhale 2 puffs into the lungs every 6 (six) hours as needed for wheezing or shortness of breath.     atorvastatin (LIPITOR) 20 MG tablet Take 20 mg by mouth at bedtime.     Cholecalciferol 125 MCG (5000 UT) TABS Take 5,000 Units by mouth daily.     escitalopram (LEXAPRO) 10 MG tablet Take 10 mg by mouth daily.  3   famotidine (PEPCID) 10 MG tablet Take 10 mg by mouth at bedtime.     Fluticasone-Umeclidin-Vilant (TRELEGY ELLIPTA) 200-62.5-25 MCG/INH AEPB      furosemide (LASIX) 20 MG tablet TAKE ONE TABLET AS NEEDED FOR WEIGHT GAIN OF 2  POUNDS IN 24 HOURS OR 5 POUNDS IN 7 DAYS (Patient taking differently: Take 20 mg by mouth See admin instructions. 1 tab as needed for weight gain of 2 pounds in 24 hours or 5 pounds in 7 days) 30 tablet 1   ibuprofen (ADVIL) 200 MG tablet Take 400 mg by mouth every 6 (six) hours as needed for headache or moderate pain.     levothyroxine (SYNTHROID) 75 MCG tablet Take 75 mcg by mouth See admin instructions. Alternate every other day with 50 mcg     levothyroxine (SYNTHROID, LEVOTHROID) 50 MCG tablet Take 1 tablet by mouth every other day. Alternate every other day  with 75 mcg     olmesartan (BENICAR) 20 MG tablet Take 20 mg by mouth daily.     Fluticasone-Umeclidin-Vilant (TRELEGY ELLIPTA) 100-62.5-25 MCG/INH AEPB Inhale 1 puff into the lungs daily.     oxyCODONE-acetaminophen (PERCOCET/ROXICET) 5-325 MG tablet Take 1 tablet by mouth every 6 (six) hours as needed for severe pain. 15 tablet 0   Facility-Administered Medications Prior to Visit  Medication Dose Route Frequency Provider Last Rate Last Admin   0.9 %  sodium chloride infusion   Intravenous Continuous Causey, Charlestine Massed, NP   Stopped at 12/24/18 1548    Review of Systems  Constitutional:  Negative for chills, fever, malaise/fatigue and weight loss.  HENT:  Negative for hearing loss, sore throat and tinnitus.   Eyes:  Negative for blurred vision and double vision.  Respiratory:  Positive for shortness of breath. Negative for cough, hemoptysis, sputum production, wheezing and stridor.   Cardiovascular:  Positive for chest pain. Negative for palpitations, orthopnea, leg swelling and PND.  Gastrointestinal:  Negative for abdominal pain, constipation, diarrhea, heartburn, nausea and vomiting.  Genitourinary:  Negative for dysuria, hematuria and urgency.  Musculoskeletal:  Negative for joint pain and myalgias.  Skin:  Negative for itching and rash.  Neurological:  Negative for dizziness, tingling, weakness and headaches.   Endo/Heme/Allergies:  Negative for environmental allergies. Does not bruise/bleed easily.  Psychiatric/Behavioral:  Negative for depression. The patient is not nervous/anxious and does not have insomnia.   All other systems reviewed and are negative.   Objective:  Physical Exam Vitals reviewed.  Constitutional:      General: She is not in acute distress.    Appearance: She is well-developed.  HENT:     Head: Normocephalic and atraumatic.  Eyes:     General: No scleral icterus.  Conjunctiva/sclera: Conjunctivae normal.     Pupils: Pupils are equal, round, and reactive to light.  Neck:     Vascular: No JVD.     Trachea: No tracheal deviation.  Cardiovascular:     Rate and Rhythm: Normal rate and regular rhythm.     Heart sounds: Normal heart sounds. No murmur heard. Pulmonary:     Effort: Pulmonary effort is normal. No tachypnea, accessory muscle usage or respiratory distress.     Breath sounds: No stridor. No wheezing, rhonchi or rales.     Comments: Distant breath sounds bilaterally no crackles no wheeze Abdominal:     General: Bowel sounds are normal. There is no distension.     Palpations: Abdomen is soft.     Tenderness: There is no abdominal tenderness.  Musculoskeletal:        General: No tenderness.     Cervical back: Neck supple.  Lymphadenopathy:     Cervical: No cervical adenopathy.  Skin:    General: Skin is warm and dry.     Capillary Refill: Capillary refill takes less than 2 seconds.     Findings: No rash.  Neurological:     Mental Status: She is alert and oriented to person, place, and time.  Psychiatric:        Behavior: Behavior normal.     Vitals:   09/13/20 1024  BP: 116/60  Pulse: 88  Temp: 97.8 F (36.6 C)  TempSrc: Oral  SpO2: 98%  Weight: 172 lb 9.6 oz (78.3 kg)  Height: 5' 3.5" (1.613 m)   98% on RA BMI Readings from Last 3 Encounters:  09/13/20 30.10 kg/m  04/04/20 29.50 kg/m  01/19/20 29.76 kg/m   Wt Readings from Last 3  Encounters:  09/13/20 172 lb 9.6 oz (78.3 kg)  04/04/20 169 lb 3.2 oz (76.7 kg)  01/19/20 170 lb 11.2 oz (77.4 kg)     CBC    Component Value Date/Time   WBC 6.7 09/08/2020 0448   RBC 4.64 09/08/2020 0448   HGB 13.4 09/08/2020 0448   HGB 12.3 07/22/2018 0835   HGB 14.1 09/08/2006 1500   HCT 42.9 09/08/2020 0448   HCT 39.7 09/08/2006 1500   PLT 409 (H) 09/08/2020 0448   PLT 266 07/22/2018 0835   PLT 240 09/08/2006 1500   MCV 92.5 09/08/2020 0448   MCV 87.9 09/08/2006 1500   MCH 28.9 09/08/2020 0448   MCHC 31.2 09/08/2020 0448   RDW 13.3 09/08/2020 0448   RDW 12.0 09/08/2006 1500   LYMPHSABS 2.0 09/08/2020 0448   LYMPHSABS 1.8 09/08/2006 1500   MONOABS 0.5 09/08/2020 0448   MONOABS 0.5 09/08/2006 1500   EOSABS 0.2 09/08/2020 0448   EOSABS 0.1 09/08/2006 1500   BASOSABS 0.1 09/08/2020 0448   BASOSABS 0.1 09/08/2006 1500     Chest Imaging: 09/08/2020 CT chest: Right middle lobe nodular opacity, severe to moderate emphysema throughout the lung, centrilobular. The patient's images have been independently reviewed by me.    Pulmonary Functions Testing Results: No flowsheet data found.  FeNO:   Pathology:   Echocardiogram:   Heart Catheterization:     Assessment & Plan:     ICD-10-CM   1. Nodule of middle lobe of right lung  R91.1     2. Centrilobular emphysema ([place])  J43.2     3. History of breast cancer, left, status post partial mastectomy and XRT  Z85.3     4. History of tobacco use  Z87.891  5. Family history of lung cancer  Z80.1     6. Family history of breast cancer  Z80.3     7. Family history of esophageal cancer  Z80.0       Discussion: This is a 65 year old female, CT imaging with significant centrilobular emphysema, former smoker.  Also has a significant breast cancer history.  Recent incidentally found right middle lobe nodule on CT imaging from the emergency department as well as a small pleural effusion.  I think she needs to  follow-up closely for both of these.  The effusion is too small to consider sampling at this time.  Plan: Repeat noncontrasted CT chest in 3 months. Full pulmonary function test prior to next office visit. As for her likely diagnosis of COPD continue Trelegy which she is already on, as needed albuterol Continue vitamin D supplementation.  Follow-up with me or APP in 3 months to review CT imaging regarding next best steps with right middle lobe lung nodule.    Current Outpatient Medications:    albuterol (PROVENTIL HFA;VENTOLIN HFA) 108 (90 BASE) MCG/ACT inhaler, Inhale 2 puffs into the lungs every 6 (six) hours as needed for wheezing or shortness of breath., Disp: , Rfl:    atorvastatin (LIPITOR) 20 MG tablet, Take 20 mg by mouth at bedtime., Disp: , Rfl:    Cholecalciferol 125 MCG (5000 UT) TABS, Take 5,000 Units by mouth daily., Disp: , Rfl:    escitalopram (LEXAPRO) 10 MG tablet, Take 10 mg by mouth daily., Disp: , Rfl: 3   famotidine (PEPCID) 10 MG tablet, Take 10 mg by mouth at bedtime., Disp: , Rfl:    Fluticasone-Umeclidin-Vilant (TRELEGY ELLIPTA) 200-62.5-25 MCG/INH AEPB, , Disp: , Rfl:    furosemide (LASIX) 20 MG tablet, TAKE ONE TABLET AS NEEDED FOR WEIGHT GAIN OF 2 POUNDS IN 24 HOURS OR 5 POUNDS IN 7 DAYS (Patient taking differently: Take 20 mg by mouth See admin instructions. 1 tab as needed for weight gain of 2 pounds in 24 hours or 5 pounds in 7 days), Disp: 30 tablet, Rfl: 1   ibuprofen (ADVIL) 200 MG tablet, Take 400 mg by mouth every 6 (six) hours as needed for headache or moderate pain., Disp: , Rfl:    levothyroxine (SYNTHROID) 75 MCG tablet, Take 75 mcg by mouth See admin instructions. Alternate every other day with 50 mcg, Disp: , Rfl:    levothyroxine (SYNTHROID, LEVOTHROID) 50 MCG tablet, Take 1 tablet by mouth every other day. Alternate every other day  with 75 mcg, Disp: , Rfl:    olmesartan (BENICAR) 20 MG tablet, Take 20 mg by mouth daily., Disp: , Rfl:  No current  facility-administered medications for this visit.  Facility-Administered Medications Ordered in Other Visits:    0.9 %  sodium chloride infusion, , Intravenous, Continuous, Causey, Charlestine Massed, NP, Stopped at 12/24/18 [facility] 09/13/2020 10:48 AM

## 2020-09-13 NOTE — Patient Instructions (Signed)
Thank you for visiting Dr. Valeta Harms at [facility]. Today we recommend the following:  Orders Placed This Encounter  Procedures   CT Super D Chest Wo Contrast   Pulmonary Function Test   Follow up with me after your CT and PFTs in 3 months  Continue trelegy  Continue Vitamin d Supplement   Return in about 3 months (around 12/14/2020) for with APP or Dr. Valeta Harms.    Please do your part to reduce the spread of COVID-19.

## 2020-09-15 DIAGNOSIS — Z0000 Encounter for general adult medical examination without abnormal findings: Secondary | ICD-10-CM | POA: Diagnosis not present

## 2020-09-19 DIAGNOSIS — Z0000 Encounter for general adult medical examination without abnormal findings: Secondary | ICD-10-CM | POA: Diagnosis not present

## 2020-09-19 DIAGNOSIS — Z23 Encounter for immunization: Secondary | ICD-10-CM | POA: Diagnosis not present

## 2020-09-19 DIAGNOSIS — Z1211 Encounter for screening for malignant neoplasm of colon: Secondary | ICD-10-CM | POA: Diagnosis not present

## 2020-10-04 ENCOUNTER — Other Ambulatory Visit: Payer: BC Managed Care – PPO

## 2020-10-24 ENCOUNTER — Encounter: Payer: Self-pay | Admitting: Oncology

## 2020-10-24 ENCOUNTER — Ambulatory Visit
Admission: RE | Admit: 2020-10-24 | Discharge: 2020-10-24 | Disposition: A | Discharge status: Home / Self Care | Payer: BC Managed Care – PPO | Source: Ambulatory Visit | Attending: Pulmonary Disease | Admitting: Pulmonary Disease

## 2020-10-24 DIAGNOSIS — I700 Atherosclerosis of aorta: Secondary | ICD-10-CM | POA: Diagnosis not present

## 2020-10-24 DIAGNOSIS — R918 Other nonspecific abnormal finding of lung field: Secondary | ICD-10-CM | POA: Diagnosis not present

## 2020-10-24 DIAGNOSIS — J439 Emphysema, unspecified: Secondary | ICD-10-CM | POA: Diagnosis not present

## 2020-10-24 DIAGNOSIS — J432 Centrilobular emphysema: Secondary | ICD-10-CM

## 2020-10-24 DIAGNOSIS — R911 Solitary pulmonary nodule: Secondary | ICD-10-CM | POA: Diagnosis not present

## 2020-10-26 ENCOUNTER — Encounter: Payer: Self-pay | Admitting: *Deleted

## 2020-10-26 NOTE — Progress Notes (Signed)
Please let her know. Nodule that we were following has resolved. We can discuss more on her follow up visit planned in nov.   Thanks,  BLI  Garner Nash, DO [facility] 10/26/2020 4:26 PM

## 2020-11-03 ENCOUNTER — Ambulatory Visit (INDEPENDENT_AMBULATORY_CARE_PROVIDER_SITE_OTHER): Payer: BC Managed Care – PPO | Admitting: Cardiovascular Disease

## 2020-11-03 ENCOUNTER — Other Ambulatory Visit: Payer: Self-pay

## 2020-11-03 VITALS — BP 110/62 | HR 76 | Ht 63.5 in | Wt 173.8 lb

## 2020-11-03 DIAGNOSIS — E7800 Pure hypercholesterolemia, unspecified: Secondary | ICD-10-CM

## 2020-11-03 DIAGNOSIS — I10 Essential (primary) hypertension: Secondary | ICD-10-CM

## 2020-11-03 DIAGNOSIS — I2584 Coronary atherosclerosis due to calcified coronary lesion: Secondary | ICD-10-CM | POA: Diagnosis not present

## 2020-11-03 DIAGNOSIS — Z87891 Personal history of nicotine dependence: Secondary | ICD-10-CM

## 2020-11-03 DIAGNOSIS — I2510 Atherosclerotic heart disease of native coronary artery without angina pectoris: Secondary | ICD-10-CM | POA: Diagnosis not present

## 2020-11-03 NOTE — Progress Notes (Signed)
Cardiology Office Note   Date:  11/03/2020   ID:  Dorothy Frederick, DOB 1955/05/19, MRN [id number]  PCP:  [name], MD  Cardiologist:   Skeet Latch, MD   No chief complaint on file.   History of Present Illness: Dorothy Frederick is a 65 y.o. female with hyperlipidemia, prior tobacco abuse, COPD, and breast cancer who presents for follow up.  She first saw Dr. Loletha Grayer in 2016 for shortness of breath.  At the time she was noted to have an abnormal EKG.  She was referred for an echo and a stress test.  Echocardiogram 09/2013 revealed LVEF 55 to 60% and was otherwise unremarkable.  Nuclear stress test was concerning for LAD ischemia.  She underwent cardiac catheterization and was found to have mild proximal LAD stenosis and a possible myocardial bridge in the distal LAD and 220% OM stenosis.  She was seen in the office by Doreene Adas, PA-C on 09/2017 for preoperative risk assessment.  She was felt to be safe for surgery.  She was again seen for surgical care in 07/2018 in the setting of recurrent breast cancer requiring mastectomy.    She had no cardiac concerns and was deemed to be at acceptable risk for surgery.  Dorothy Frederick had an echo 07/2019 revealed LVEF 55 to 60% with grade 1 diastolic dysfunction.  Right atrial pressure was 3 mmHg.  GLS was not reported.  This was unchanged from prior. At her last appointment Dorothy Frederick reported edema and shortness of breath.  She had an echo 09/2019 that revealed LVEF 55 to 60% with grade 1 diastolic dysfunction.  Right atrial pressure was 3 mmHg.  She was given Lasix 20 mg to take as needed. Since that appointment she completed her chemotherapy 09/2019.  She had her Port-A-Cath removed 10/20/2019.  She was on paclitaxel as well until 09/2018 when it was discontinued for peripheral neuropathy.  Lately she has been doing okay.  She walks once a day for about 10 minutes.  She has to stop due to her emphysema.  She sees pulmonary again next month and is unsure  whether she will need oxygen with exertion.  She has no chest pain or pressure.  She occasionally has very mild lower extremity edema.  She has not needed to take any Lasix in a couple months.  She denies orthopnea or PND.  Her blood pressure has been well-controlled at home.  She had lipids checked with her PCP and reports that her LDL was most recently 33, which she attributes to increasing the atorvastatin.   Past Medical History:  Diagnosis Date   Angina at rest [facility])    Anxiety    Cancer ([place]) 2000   left breast cancer-partial mastectomy   COPD (chronic obstructive pulmonary disease) ([place])    Depression    Family history of breast cancer    Family history of esophageal cancer    Family history of lung cancer    Family history of prostate cancer    GERD (gastroesophageal reflux disease)    Hyperlipidemia    Hypertension    Hypothyroidism    Recurrent breast cancer, left [facility])     Past Surgical History:  Procedure Laterality Date   ADENOIDECTOMY     APPLICATION OF WOUND VAC Left 04/01/2019   Procedure: Placement of provena wound vac;  Surgeon: Wallace Going, DO;  Location: [place];  Service: Plastics;  Laterality: Left;   CARDIAC CATHETERIZATION     DEBRIDEMENT  AND CLOSURE WOUND Left 04/01/2019   Procedure: Left breast wound excision and closure;  Surgeon: Wallace Going, DO;  Location: [place];  Service: Plastics;  Laterality: Left;   LEFT HEART CATHETERIZATION WITH CORONARY ANGIOGRAM N/A 10/21/2013   Procedure: LEFT HEART CATHETERIZATION WITH CORONARY ANGIOGRAM;  Surgeon: Burnell Blanks, MD;  Location: [facility] CATH LAB;  Service: Cardiovascular;  Laterality: N/A;   MASTECTOMY W/ SENTINEL NODE BIOPSY Left 09/16/2018   Procedure: LEFT TOTAL MASTECTOMY WITH LEFT AXILLARY DEEP SENTINEL LYMPH NODE BIOPSY AND INJECT BLUE DYE;  Surgeon: Fanny Skates, MD;  Location: [place];  Service: General;  Laterality: Left;    MASTECTOMY, PARTIAL Left 2000   PORT-A-CATH REMOVAL Right 10/20/2019   Procedure: REMOVAL PORT-A-CATH;  Surgeon: Coralie Keens, MD;  Location: [place];  Service: General;  Laterality: Right;   PORTACATH PLACEMENT Right 09/16/2018   Procedure: INSERTION PORT-A-CATH;  Surgeon: Fanny Skates, MD;  Location: [place];  Service: General;  Laterality: Right;   TONSILLECTOMY       Current Outpatient Medications  Medication Sig Dispense Refill   albuterol (PROVENTIL HFA;VENTOLIN HFA) 108 (90 BASE) MCG/ACT inhaler Inhale 2 puffs into the lungs every 6 (six) hours as needed for wheezing or shortness of breath.     atorvastatin (LIPITOR) 40 MG tablet Take 40 mg by mouth daily.     Cholecalciferol 125 MCG (5000 UT) TABS Take 5,000 Units by mouth daily.     escitalopram (LEXAPRO) 10 MG tablet Take 10 mg by mouth daily.  3   famotidine (PEPCID) 10 MG tablet Take 10 mg by mouth at bedtime.     Fluticasone-Umeclidin-Vilant (TRELEGY ELLIPTA) 200-62.5-25 MCG/INH AEPB      furosemide (LASIX) 20 MG tablet TAKE ONE TABLET AS NEEDED FOR WEIGHT GAIN OF 2 POUNDS IN 24 HOURS OR 5 POUNDS IN 7 DAYS (Patient taking differently: Take 20 mg by mouth See admin instructions. 1 tab as needed for weight gain of 2 pounds in 24 hours or 5 pounds in 7 days) 30 tablet 1   ibuprofen (ADVIL) 200 MG tablet Take 400 mg by mouth every 6 (six) hours as needed for headache or moderate pain.     levothyroxine (SYNTHROID) 75 MCG tablet Take 75 mcg by mouth See admin instructions. Alternate every other day with 50 mcg     levothyroxine (SYNTHROID, LEVOTHROID) 50 MCG tablet Take 1 tablet by mouth every other day. Alternate every other day  with 75 mcg     olmesartan (BENICAR) 20 MG tablet Take 20 mg by mouth daily.     No current facility-administered medications for this visit.   Facility-Administered Medications Ordered in Other Visits  Medication Dose Route Frequency Provider Last Rate Last Admin    0.9 %  sodium chloride infusion   Intravenous Continuous Causey, Charlestine Massed, NP   Stopped at 12/24/18 1548    Allergies:   Benadryl [diphenhydramine hcl (sleep)] and Sulfa antibiotics    Social History:  The patient  reports that she quit smoking about 7 years ago. Her smoking use included cigarettes. She started smoking about 49 years ago. She has a 57.00 pack-year smoking history. She has never used smokeless tobacco. She reports that she does not drink alcohol and does not use drugs.   Family History:  The patient's family history includes Breast cancer in her maternal aunt and sister; Cancer in her father; Esophageal cancer in her maternal aunt; Heart disease in her mother and sister;  Hyperlipidemia in her maternal grandmother; Hypertension in her brother, brother, father, and sister; Lung cancer in her maternal aunt; Prostate cancer in her father; Stomach cancer in her paternal grandmother; Stroke in her maternal grandmother.    ROS:  Please see the history of present illness.   Otherwise, review of systems are positive for none.   All other systems are reviewed and negative.    PHYSICAL EXAM: VS:  BP 110/62 (BP Location: Right Arm, Patient Position: Sitting, Cuff Size: Normal)   Pulse 76   Ht 5' 3.5" (1.613 m)   Wt 173 lb 12.8 oz (78.8 kg)   BMI 30.30 kg/m  , BMI Body mass index is 30.3 kg/m. GENERAL:  Well appearing HEENT: Pupils equal round and reactive, fundi not visualized, oral mucosa unremarkable NECK:  No jugular venous distention, waveform within normal limits, carotid upstroke brisk and symmetric, no bruits LUNGS:  Clear to auscultation bilaterally HEART:  RRR.  PMI not displaced or sustained,S1 and S2 within normal limits, no S3, no S4, no clicks, no rubs, no murmurs ABD:  Flat, positive bowel sounds normal in frequency in pitch, no bruits, no rebound, no guarding, no midline pulsatile mass, no hepatomegaly, no splenomegaly EXT:  2 plus pulses throughout, no  edema, no cyanosis no clubbing SKIN:  No rashes no nodules NEURO:  Cranial nerves II through XII grossly intact, motor grossly intact throughout PSYCH:  Cognitively intact, oriented to person place and time  EKG:  EKG is ordered today. The ekg ordered 09/15/19 demonstrates sinus rhythm.  Rate 70 bpm.  Cannot rule out prior inferior infarct. 11/03/20: Sinus rhythm.  Rate 74 bpm.  Anterior T wave inversions.  Echo 09/28/19: Okay  1. Left ventricular ejection fraction, by estimation, is 55 to 60%. The  left ventricle has normal function. The left ventricle has no regional  wall motion abnormalities. Left ventricular diastolic parameters are  consistent with Grade I diastolic  dysfunction (impaired relaxation). GLS -18.6%, within normal range.   2. Right ventricular systolic function is normal. The right ventricular  size is normal. There is normal pulmonary artery systolic pressure. The  estimated right ventricular systolic pressure is 09.3 mmHg.   3. The mitral valve is normal in structure. Trivial mitral valve  regurgitation. No evidence of mitral stenosis.   4. The aortic valve is tricuspid. Aortic valve regurgitation is not  visualized. No aortic stenosis is present.   5. The inferior vena cava is normal in size with greater than 50%  respiratory variability, suggesting right atrial pressure of 3 mmHg.    Recent Labs: 09/08/2020: ALT 19; BUN 16; Creatinine, Ser 1.23; Hemoglobin 13.4; Platelets 409; Potassium 4.2; Sodium 137    Lipid Panel No results found for: CHOL, TRIG, HDL, CHOLHDL, VLDL, LDLCALC, LDLDIRECT   Wt Readings from Last 3 Encounters:  11/03/20 173 lb 12.8 oz (78.8 kg)  09/13/20 172 lb 9.6 oz (78.3 kg)  04/04/20 169 lb 3.2 oz (76.7 kg)     ASSESSMENT AND PLAN:  # Chronic diastolic heart failure: # Essential hypertension:  Echo 09/2019 showed normal systolic function with grade 1 diastolic dysfunction.  Volume status appears stable.  She rarely needs to use extra  Lasix.  Blood pressures well have been controlled.  Continue olmesartan.  # Non-obstructive CAD: # Hyperlipidemia: Mild LAD and OM disease on cath previously.  She is asymptomatic.  Continue atorvastatin.  LDL was 78 with PCP.   Current medicines are reviewed at length with the patient today.  The patient  does not have concerns regarding medicines.  The following changes have been made: none  Labs/ tests ordered today include:   No orders of the defined types were placed in this encounter.    Disposition:   FU with Khylie Larmore C. Oval Linsey, MD, [facility] in 1 year    Signed, Jarrett Chicoine C. Oval Linsey, MD, [facility]  11/03/2020 2:57 PM    [facility]

## 2020-11-03 NOTE — Patient Instructions (Signed)
Medication Instructions:  ?Your physician recommends that you continue on your current medications as directed. Please refer to the Current Medication list given to you today.  ? ?*If you need a refill on your cardiac medications before your next appointment, please call your pharmacy* ? ?Lab Work: ?NONE ? ?Testing/Procedures: ?NONE ? ?Follow-Up: ?At [facility], you and your health needs are our priority.  As part of our continuing mission to provide you with exceptional heart care, we have created designated Provider Care Teams.  These Care Teams include your primary Cardiologist (physician) and Advanced Practice Providers (APPs -  Physician Assistants and Nurse Practitioners) who all work together to provide you with the care you need, when you need it. ? ?We recommend signing up for the patient portal called "MyChart".  Sign up information is provided on this After Visit Summary.  MyChart is used to connect with patients for Virtual Visits (Telemedicine).  Patients are able to view lab/test results, encounter notes, upcoming appointments, etc.  Non-urgent messages can be sent to your provider as well.   ?To learn more about what you can do with MyChart, go to [email or web address].   ? ?Your next appointment:   ?12 month(s) ? ?The format for your next appointment:   ?In Person ? ?Provider:   ?Tiffany Cavalier, MD  ? ? ? ? ? ? ?

## 2020-11-05 ENCOUNTER — Encounter (HOSPITAL_BASED_OUTPATIENT_CLINIC_OR_DEPARTMENT_OTHER): Payer: Self-pay | Admitting: Cardiovascular Disease

## 2020-12-06 DIAGNOSIS — E782 Mixed hyperlipidemia: Secondary | ICD-10-CM | POA: Diagnosis not present

## 2020-12-06 DIAGNOSIS — E559 Vitamin D deficiency, unspecified: Secondary | ICD-10-CM | POA: Diagnosis not present

## 2020-12-06 DIAGNOSIS — I10 Essential (primary) hypertension: Secondary | ICD-10-CM | POA: Diagnosis not present

## 2020-12-06 DIAGNOSIS — E039 Hypothyroidism, unspecified: Secondary | ICD-10-CM | POA: Diagnosis not present

## 2020-12-11 ENCOUNTER — Ambulatory Visit (INDEPENDENT_AMBULATORY_CARE_PROVIDER_SITE_OTHER): Payer: BC Managed Care – PPO | Admitting: Pulmonary Disease

## 2020-12-11 ENCOUNTER — Other Ambulatory Visit: Payer: Self-pay

## 2020-12-11 ENCOUNTER — Encounter: Payer: Self-pay | Admitting: Pulmonary Disease

## 2020-12-11 VITALS — BP 106/54 | HR 90 | Temp 97.4°F | Ht 63.0 in | Wt 174.0 lb

## 2020-12-11 DIAGNOSIS — Z801 Family history of malignant neoplasm of trachea, bronchus and lung: Secondary | ICD-10-CM

## 2020-12-11 DIAGNOSIS — J432 Centrilobular emphysema: Secondary | ICD-10-CM

## 2020-12-11 DIAGNOSIS — Z853 Personal history of malignant neoplasm of breast: Secondary | ICD-10-CM

## 2020-12-11 DIAGNOSIS — J449 Chronic obstructive pulmonary disease, unspecified: Secondary | ICD-10-CM | POA: Diagnosis not present

## 2020-12-11 DIAGNOSIS — Z800 Family history of malignant neoplasm of digestive organs: Secondary | ICD-10-CM

## 2020-12-11 DIAGNOSIS — R911 Solitary pulmonary nodule: Secondary | ICD-10-CM

## 2020-12-11 DIAGNOSIS — Z87891 Personal history of nicotine dependence: Secondary | ICD-10-CM

## 2020-12-11 DIAGNOSIS — Z803 Family history of malignant neoplasm of breast: Secondary | ICD-10-CM

## 2020-12-11 LAB — PULMONARY FUNCTION TEST
DL/VA % pred: 46 %
DL/VA: 1.95 ml/min/mmHg/L
DLCO cor % pred: 39 %
DLCO cor: 7.62 ml/min/mmHg
DLCO unc % pred: 39 %
DLCO unc: 7.62 ml/min/mmHg
FEF 25-75 Post: 0.86 L/sec
FEF 25-75 Pre: 0.75 L/sec
FEF2575-%Change-Post: 15 %
FEF2575-%Pred-Post: 40 %
FEF2575-%Pred-Pre: 35 %
FEV1-%Change-Post: 4 %
FEV1-%Pred-Post: 68 %
FEV1-%Pred-Pre: 65 %
FEV1-Post: 1.62 L
FEV1-Pre: 1.55 L
FEV1FVC-%Change-Post: 2 %
FEV1FVC-%Pred-Pre: 76 %
FEV6-%Change-Post: 2 %
FEV6-%Pred-Post: 90 %
FEV6-%Pred-Pre: 88 %
FEV6-Post: 2.69 L
FEV6-Pre: 2.63 L
FEV6FVC-%Change-Post: 0 %
FEV6FVC-%Pred-Post: 103 %
FEV6FVC-%Pred-Pre: 103 %
FVC-%Change-Post: 1 %
FVC-%Pred-Post: 86 %
FVC-%Pred-Pre: 85 %
FVC-Post: 2.69 L
FVC-Pre: 2.65 L
Post FEV1/FVC ratio: 60 %
Post FEV6/FVC ratio: 100 %
Pre FEV1/FVC ratio: 59 %
Pre FEV6/FVC Ratio: 99 %
RV % pred: 104 %
RV: 2.16 L
TLC % pred: 100 %
TLC: 5.01 L

## 2020-12-11 NOTE — Progress Notes (Signed)
Synopsis: Referred in August 2022 for lung nodule, pleural effusion, PCP: By [name], MD  Subjective:   PATIENT ID: Dorothy Frederick GENDER: female DOB: 09/04/1955, MRN: [id number]  Chief Complaint  Patient presents with   Follow-up    Follow up on PFT results and Cat scan on the lungs    This is a 65 year old female, history of left breast cancer, partial mastectomy in 2000, COPD, family history of breast cancer, esophageal cancer, lung cancer.  Patient has hyperlipidemia and hypertension.She was seen in the emergency department on 09/08/2020.  CT imaging in the emergency department was complete which revealed a 1.6 cm nodular opacity within the right middle lobe, moderate to severe emphysema and a small right-sided pleural effusion.  Patient was referred here for follow-up to discuss next steps.  As well as to establish care with pulmonologist.  OV 09/13/2020: Has no respiratory complaints today.  Was seen in the ER for right-sided chest pains.  From respiratory standpoint she feels okay.  Chest pains have resolved.  Reviewed CT imaging today in the office with the patient.  She is already enrolled in her lung cancer screening that was last completed in January.  She is a former smoker.  OV 12/11/2020: here today after PFT follow up.  Patient had pulmonary function test completed prior to today's office visit which revealed a reduced ratio of 60, FEV1 of 68% predicted, no significant bronchodilator response, total lung volumes within normal limits, severely reduced DLCO at 39% predicted.  Using Trelegy daily and as needed albuterol for her COPD management.  She had a CT scan of the chest in October 2022 which revealed resolution of the right middle lobe nodule that was initially seen and sent for referral.  Oncology History  Malignant neoplasm of upper-outer quadrant of left breast in female, estrogen receptor negative ([place])  07/15/2018 Initial Diagnosis   Malignant neoplasm of  upper-outer quadrant of left breast in female, estrogen receptor negative ([place])   07/29/2018 Genetic Testing   Genetic testing reported out on 07/29/2018 through the [facility] Common Hereditary cancer panel found no pathogenic mutations.   The Common Hereditary Cancers Panel offered by [facility] includes sequencing and/or deletion duplication testing of the following 47 genes: APC, ATM, AXIN2, BARD1, BMPR1A, BRCA1, BRCA2, BRIP1, CDH1, CDKN2A (p14ARF), CDKN2A (p16INK4a), CKD4, CHEK2, CTNNA1, DICER1, EPCAM (Deletion/duplication testing only), GREM1 (promoter region deletion/duplication testing only), KIT, MEN1, MLH1, MSH2, MSH3, MSH6, MUTYH, NBN, NF1, NHTL1, PALB2, PDGFRA, PMS2, POLD1, POLE, PTEN, RAD50, RAD51C, RAD51D, SDHB, SDHC, SDHD, SMAD4, SMARCA4. STK11, TP53, TSC1, TSC2, and VHL.  The following genes were evaluated for sequence changes only: SDHA and HOXB13 c.251G>A variant only.   09/16/2018 Surgery   Left mastectomy [facility]) [phone]): invasive ductal carcinoma, grade 3, with high grade DCIS. Negative margins. 6 lymph nodes were negative for carcinoma. ER/PR negative. Her-2 positive. Ki67 70%.   10/21/2018 - 10/06/2019 Chemotherapy   PACLitaxel (TAXOL) 150 mg in sodium chloride 0.9 % 250 mL chemo infusion (</= $RemoveBefor'80mg'YevhFQwYZAIL$ /m2), 80 mg/m2 = 150 mg, Intravenous,  Once, 2 of 2 cycles. Administration: 150 mg (10/21/2018), 150 mg (10/29/2018), 150 mg (11/19/2018), 150 mg (11/05/2018), 150 mg (11/12/2018), 150 mg (11/26/2018), 150 mg (12/10/2018), 150 mg (12/16/2018).  trastuzumab-dkst (OGIVRI) 300 mg in sodium chloride 0.9 % 250 mL chemo infusion, 315 mg, Intravenous,  Once, 16 of 16 cycles. Administration: 300 mg (10/21/2018), 150 mg (11/19/2018), 450 mg (12/24/2018), 450 mg (01/13/2019), 450 mg (03/17/2019), 450 mg (04/07/2019), 450 mg (06/09/2019), 450 mg (06/30/2019), 450 mg (  09/08/2019), 150 mg (11/05/2018), 150 mg (11/12/2018), 150 mg (11/26/2018), 150 mg (12/10/2018), 150 mg (12/16/2018), 450 mg (02/03/2019), 450 mg  (02/24/2019), 450 mg (04/28/2019), 450 mg (05/19/2019), 450 mg (07/28/2019), 450 mg (08/18/2019), 450 mg (10/06/2019).       Past Medical History:  Diagnosis Date   Angina at rest [facility])    Anxiety    Cancer ([place]) 2000   left breast cancer-partial mastectomy   COPD (chronic obstructive pulmonary disease) ([place])    Depression    Family history of breast cancer    Family history of esophageal cancer    Family history of lung cancer    Family history of prostate cancer    GERD (gastroesophageal reflux disease)    Hyperlipidemia    Hypertension    Hypothyroidism    Recurrent breast cancer, left ([place])      Family History  Problem Relation Age of Onset   Heart disease Mother    Hypertension Father    Cancer Father    Prostate cancer Father    Hypertension Sister    Breast cancer Sister        dx 9s   Heart disease Sister    Hypertension Brother    Hyperlipidemia Maternal Grandmother    Stroke Maternal Grandmother    Hypertension Brother    Breast cancer Maternal Aunt    Lung cancer Maternal Aunt    Esophageal cancer Maternal Aunt    Stomach cancer Paternal Grandmother      Past Surgical History:  Procedure Laterality Date   ADENOIDECTOMY     APPLICATION OF WOUND VAC Left 04/01/2019   Procedure: Placement of provena wound vac;  Surgeon: Wallace Going, DO;  Location: [place];  Service: Plastics;  Laterality: Left;   CARDIAC CATHETERIZATION     DEBRIDEMENT AND CLOSURE WOUND Left 04/01/2019   Procedure: Left breast wound excision and closure;  Surgeon: Wallace Going, DO;  Location: [place];  Service: Plastics;  Laterality: Left;   LEFT HEART CATHETERIZATION WITH CORONARY ANGIOGRAM N/A 10/21/2013   Procedure: LEFT HEART CATHETERIZATION WITH CORONARY ANGIOGRAM;  Surgeon: Burnell Blanks, MD;  Location: [facility] CATH LAB;  Service: Cardiovascular;  Laterality: N/A;   MASTECTOMY W/ SENTINEL NODE BIOPSY Left 09/16/2018   Procedure: LEFT  TOTAL MASTECTOMY WITH LEFT AXILLARY DEEP SENTINEL LYMPH NODE BIOPSY AND INJECT BLUE DYE;  Surgeon: Fanny Skates, MD;  Location: [place];  Service: General;  Laterality: Left;   MASTECTOMY, PARTIAL Left 2000   PORT-A-CATH REMOVAL Right 10/20/2019   Procedure: REMOVAL PORT-A-CATH;  Surgeon: Coralie Keens, MD;  Location: [place];  Service: General;  Laterality: Right;   PORTACATH PLACEMENT Right 09/16/2018   Procedure: INSERTION PORT-A-CATH;  Surgeon: Fanny Skates, MD;  Location: [place];  Service: General;  Laterality: Right;   TONSILLECTOMY      Social History   Socioeconomic History   Marital status: Single    Spouse name: Not on file   Number of children: Not on file   Years of education: Not on file   Highest education level: Not on file  Occupational History   Not on file  Tobacco Use   Smoking status: Former    Packs/day: 1.50    Years: 38.00    Pack years: 57.00    Types: Cigarettes    Start date: 22    Quit date: 01/21/2013    Years since quitting: 7.8   Smokeless tobacco: Never  Substance and Sexual Activity   Alcohol use: No   Drug use: No   Sexual activity: Not Currently    Birth control/protection: Post-menopausal  Other Topics Concern   Not on file  Social History Narrative   ** Merged History Encounter **       Social Determinants of Health   Financial Resource Strain: Not on file  Food Insecurity: Not on file  Transportation Needs: Not on file  Physical Activity: Not on file  Stress: Not on file  Social Connections: Not on file  Intimate Partner Violence: Not on file     Allergies  Allergen Reactions   Benadryl [Diphenhydramine Hcl (Sleep)] Palpitations    Increased heartrate   Sulfa Antibiotics Rash     Outpatient Medications Prior to Visit  Medication Sig Dispense Refill   albuterol (PROVENTIL HFA;VENTOLIN HFA) 108 (90 BASE) MCG/ACT inhaler Inhale 2 puffs into the lungs every 6  (six) hours as needed for wheezing or shortness of breath.     atorvastatin (LIPITOR) 40 MG tablet Take 40 mg by mouth daily.     Cholecalciferol 125 MCG (5000 UT) TABS Take 5,000 Units by mouth daily.     escitalopram (LEXAPRO) 10 MG tablet Take 10 mg by mouth daily.  3   famotidine (PEPCID) 10 MG tablet Take 10 mg by mouth at bedtime.     Fluticasone-Umeclidin-Vilant (TRELEGY ELLIPTA) 200-62.5-25 MCG/INH AEPB      furosemide (LASIX) 20 MG tablet TAKE ONE TABLET AS NEEDED FOR WEIGHT GAIN OF 2 POUNDS IN 24 HOURS OR 5 POUNDS IN 7 DAYS (Patient taking differently: Take 20 mg by mouth See admin instructions. 1 tab as needed for weight gain of 2 pounds in 24 hours or 5 pounds in 7 days) 30 tablet 1   ibuprofen (ADVIL) 200 MG tablet Take 400 mg by mouth every 6 (six) hours as needed for headache or moderate pain.     levothyroxine (SYNTHROID) 75 MCG tablet Take 75 mcg by mouth See admin instructions. Alternate every other day with 50 mcg     levothyroxine (SYNTHROID, LEVOTHROID) 50 MCG tablet Take 1 tablet by mouth every other day. Alternate every other day  with 75 mcg     olmesartan (BENICAR) 20 MG tablet Take 20 mg by mouth daily.     Facility-Administered Medications Prior to Visit  Medication Dose Route Frequency Provider Last Rate Last Admin   0.9 %  sodium chloride infusion   Intravenous Continuous Causey, Charlestine Massed, NP   Stopped at 12/24/18 1548    Review of Systems  Constitutional:  Negative for chills, fever, malaise/fatigue and weight loss.  HENT:  Negative for hearing loss, sore throat and tinnitus.   Eyes:  Negative for blurred vision and double vision.  Respiratory:  Positive for shortness of breath. Negative for cough, hemoptysis, sputum production, wheezing and stridor.   Cardiovascular:  Negative for chest pain, palpitations, orthopnea, leg swelling and PND.  Gastrointestinal:  Negative for abdominal pain, constipation, diarrhea, heartburn, nausea and vomiting.   Genitourinary:  Negative for dysuria, hematuria and urgency.  Musculoskeletal:  Negative for joint pain and myalgias.  Skin:  Negative for itching and rash.  Neurological:  Negative for dizziness, tingling, weakness and headaches.  Endo/Heme/Allergies:  Negative for environmental allergies. Does not bruise/bleed easily.  Psychiatric/Behavioral:  Negative for depression. The patient is not nervous/anxious and does not have insomnia.   All other systems reviewed and are negative.   Objective:  Physical Exam Vitals reviewed.  Constitutional:  General: She is not in acute distress.    Appearance: She is well-developed.  HENT:     Head: Normocephalic and atraumatic.  Eyes:     General: No scleral icterus.    Conjunctiva/sclera: Conjunctivae normal.     Pupils: Pupils are equal, round, and reactive to light.  Neck:     Vascular: No JVD.     Trachea: No tracheal deviation.  Cardiovascular:     Rate and Rhythm: Normal rate and regular rhythm.     Heart sounds: Normal heart sounds. No murmur heard. Pulmonary:     Effort: Pulmonary effort is normal. No tachypnea, accessory muscle usage or respiratory distress.     Breath sounds: No stridor. No wheezing, rhonchi or rales.     Comments: Diminished breath sounds bilaterally no crackles no wheeze Abdominal:     General: Bowel sounds are normal. There is no distension.     Palpations: Abdomen is soft.     Tenderness: There is no abdominal tenderness.  Musculoskeletal:        General: No tenderness.     Cervical back: Neck supple.  Lymphadenopathy:     Cervical: No cervical adenopathy.  Skin:    General: Skin is warm and dry.     Capillary Refill: Capillary refill takes less than 2 seconds.     Findings: No rash.  Neurological:     Mental Status: She is alert and oriented to person, place, and time.  Psychiatric:        Behavior: Behavior normal.     Vitals:   12/11/20 0953  BP: (!) 106/54  Pulse: 90  Temp: (!) 97.4 F  (36.3 C)  TempSrc: Oral  SpO2: 96%  Weight: 174 lb (78.9 kg)  Height: $Remove'5\' 3"'UdojSMi$  (1.6 m)   96% on RA BMI Readings from Last 3 Encounters:  12/11/20 30.82 kg/m  11/03/20 30.30 kg/m  09/13/20 30.10 kg/m   Wt Readings from Last 3 Encounters:  12/11/20 174 lb (78.9 kg)  11/03/20 173 lb 12.8 oz (78.8 kg)  09/13/20 172 lb 9.6 oz (78.3 kg)     CBC    Component Value Date/Time   WBC 6.7 09/08/2020 0448   RBC 4.64 09/08/2020 0448   HGB 13.4 09/08/2020 0448   HGB 12.3 07/22/2018 0835   HGB 14.1 09/08/2006 1500   HCT 42.9 09/08/2020 0448   HCT 39.7 09/08/2006 1500   PLT 409 (H) 09/08/2020 0448   PLT 266 07/22/2018 0835   PLT 240 09/08/2006 1500   MCV 92.5 09/08/2020 0448   MCV 87.9 09/08/2006 1500   MCH 28.9 09/08/2020 0448   MCHC 31.2 09/08/2020 0448   RDW 13.3 09/08/2020 0448   RDW 12.0 09/08/2006 1500   LYMPHSABS 2.0 09/08/2020 0448   LYMPHSABS 1.8 09/08/2006 1500   MONOABS 0.5 09/08/2020 0448   MONOABS 0.5 09/08/2006 1500   EOSABS 0.2 09/08/2020 0448   EOSABS 0.1 09/08/2006 1500   BASOSABS 0.1 09/08/2020 0448   BASOSABS 0.1 09/08/2006 1500     Chest Imaging: 09/08/2020 CT chest: Right middle lobe nodular opacity, severe to moderate emphysema throughout the lung, centrilobular. The patient's images have been independently reviewed by me.    October CT chest: 2022: Right middle lobe nodular opacity resolved in comparison to previous images.  I showed this today to the patient in the office.  Severe centrilobular emphysema. The patient's images have been independently reviewed by me.     Pulmonary Functions Testing Results: PFT Results Latest Ref Rng &  Units 12/11/2020  FVC-Pre L 2.65  FVC-Predicted Pre % 85  FVC-Post L 2.69  FVC-Predicted Post % 86  Pre FEV1/FVC % % 59  Post FEV1/FCV % % 60  FEV1-Pre L 1.55  FEV1-Predicted Pre % 65  FEV1-Post L 1.62  DLCO uncorrected ml/min/mmHg 7.62  DLCO UNC% % 39  DLCO corrected ml/min/mmHg 7.62  DLCO COR %Predicted %  39  DLVA Predicted % 46  TLC L 5.01  TLC % Predicted % 100  RV % Predicted % 104    FeNO:   Pathology:   Echocardiogram:   Heart Catheterization:     Assessment & Plan:     ICD-10-CM   1. Stage 2 moderate COPD by GOLD classification ([place])  J44.9     2. Nodule of middle lobe of right lung  R91.1     3. Centrilobular emphysema ([place])  J43.2     4. History of tobacco use  Z87.891     5. History of breast cancer, left, status post partial mastectomy and XRT  Z85.3     6. Family history of lung cancer  Z80.1     7. Family history of breast cancer  Z80.3     8. Family history of esophageal cancer  Z80.0       Discussion: This is a 65 year old female, CT evidence of severe centrilobular emphysema, pulmonary function test completed today in the office with stage II COPD, FEV1 of 68% predicted postbronchodilator response.  We reviewed interpreted patient's PFTs today in the office.  Her right middle lobe pulmonary nodule has resolved.  She does have a significant family history and personal history of malignancy as stated above.  Plan: She needs a repeat enrollment in our lung cancer screening program in October 2022. Referral placed Continue Trelegy for her COPD Continue albuterol as needed.  Patient to follow-up with [place] in 1 year or as needed for COPD and post lung cancer screening CT image.    Current Outpatient Medications:    albuterol (PROVENTIL HFA;VENTOLIN HFA) 108 (90 BASE) MCG/ACT inhaler, Inhale 2 puffs into the lungs every 6 (six) hours as needed for wheezing or shortness of breath., Disp: , Rfl:    atorvastatin (LIPITOR) 40 MG tablet, Take 40 mg by mouth daily., Disp: , Rfl:    Cholecalciferol 125 MCG (5000 UT) TABS, Take 5,000 Units by mouth daily., Disp: , Rfl:    escitalopram (LEXAPRO) 10 MG tablet, Take 10 mg by mouth daily., Disp: , Rfl: 3   famotidine (PEPCID) 10 MG tablet, Take 10 mg by mouth at bedtime., Disp: , Rfl:    Fluticasone-Umeclidin-Vilant  (TRELEGY ELLIPTA) 200-62.5-25 MCG/INH AEPB, , Disp: , Rfl:    furosemide (LASIX) 20 MG tablet, TAKE ONE TABLET AS NEEDED FOR WEIGHT GAIN OF 2 POUNDS IN 24 HOURS OR 5 POUNDS IN 7 DAYS (Patient taking differently: Take 20 mg by mouth See admin instructions. 1 tab as needed for weight gain of 2 pounds in 24 hours or 5 pounds in 7 days), Disp: 30 tablet, Rfl: 1   ibuprofen (ADVIL) 200 MG tablet, Take 400 mg by mouth every 6 (six) hours as needed for headache or moderate pain., Disp: , Rfl:    levothyroxine (SYNTHROID) 75 MCG tablet, Take 75 mcg by mouth See admin instructions. Alternate every other day with 50 mcg, Disp: , Rfl:    levothyroxine (SYNTHROID, LEVOTHROID) 50 MCG tablet, Take 1 tablet by mouth every other day. Alternate every other day  with 75 mcg, Disp: ,  Rfl:    olmesartan (BENICAR) 20 MG tablet, Take 20 mg by mouth daily., Disp: , Rfl:  No current facility-administered medications for this visit.  Facility-Administered Medications Ordered in Other Visits:    0.9 %  sodium chloride infusion, , Intravenous, Continuous, Causey, Charlestine Massed, NP, Stopped at 12/24/18 [facility] 12/11/2020 10:16 AM

## 2020-12-11 NOTE — Patient Instructions (Addendum)
Thank you for visiting Dr. Valeta Harms at [facility]. Today we recommend the following:  Orders Placed This Encounter  Procedures   Ambulatory Referral for Lung Cancer Scre   Continue Trelegy and albuterol   Return in about 1 year (around 12/11/2021) for with APP or Dr. Valeta Harms.    Please do your part to reduce the spread of COVID-19.

## 2020-12-11 NOTE — Progress Notes (Signed)
Full PFT performed today. °

## 2020-12-11 NOTE — Patient Instructions (Signed)
Full PFT performed today. °

## 2020-12-15 DIAGNOSIS — E039 Hypothyroidism, unspecified: Secondary | ICD-10-CM | POA: Diagnosis not present

## 2020-12-15 DIAGNOSIS — F411 Generalized anxiety disorder: Secondary | ICD-10-CM | POA: Diagnosis not present

## 2020-12-15 DIAGNOSIS — I10 Essential (primary) hypertension: Secondary | ICD-10-CM | POA: Diagnosis not present

## 2020-12-15 DIAGNOSIS — E782 Mixed hyperlipidemia: Secondary | ICD-10-CM | POA: Diagnosis not present

## 2021-03-14 DIAGNOSIS — J209 Acute bronchitis, unspecified: Secondary | ICD-10-CM | POA: Diagnosis not present

## 2021-03-14 DIAGNOSIS — J440 Chronic obstructive pulmonary disease with acute lower respiratory infection: Secondary | ICD-10-CM | POA: Diagnosis not present

## 2021-03-14 DIAGNOSIS — I10 Essential (primary) hypertension: Secondary | ICD-10-CM | POA: Diagnosis not present

## 2021-03-14 DIAGNOSIS — F411 Generalized anxiety disorder: Secondary | ICD-10-CM | POA: Diagnosis not present

## 2021-03-16 ENCOUNTER — Inpatient Hospital Stay: Payer: BC Managed Care – PPO

## 2021-03-16 ENCOUNTER — Inpatient Hospital Stay: Payer: BC Managed Care – PPO | Admitting: Adult Health

## 2021-03-30 ENCOUNTER — Inpatient Hospital Stay (HOSPITAL_BASED_OUTPATIENT_CLINIC_OR_DEPARTMENT_OTHER): Payer: BC Managed Care – PPO | Admitting: Adult Health

## 2021-03-30 ENCOUNTER — Encounter: Payer: Self-pay | Admitting: Adult Health

## 2021-03-30 ENCOUNTER — Inpatient Hospital Stay: Payer: BC Managed Care – PPO | Attending: Adult Health

## 2021-03-30 ENCOUNTER — Other Ambulatory Visit: Payer: Self-pay

## 2021-03-30 VITALS — BP 113/49 | HR 90 | Temp 97.5°F | Resp 18 | Ht 63.0 in | Wt 172.2 lb

## 2021-03-30 DIAGNOSIS — Z9012 Acquired absence of left breast and nipple: Secondary | ICD-10-CM | POA: Diagnosis not present

## 2021-03-30 DIAGNOSIS — I129 Hypertensive chronic kidney disease with stage 1 through stage 4 chronic kidney disease, or unspecified chronic kidney disease: Secondary | ICD-10-CM | POA: Insufficient documentation

## 2021-03-30 DIAGNOSIS — N184 Chronic kidney disease, stage 4 (severe): Secondary | ICD-10-CM | POA: Insufficient documentation

## 2021-03-30 DIAGNOSIS — C50412 Malignant neoplasm of upper-outer quadrant of left female breast: Secondary | ICD-10-CM

## 2021-03-30 DIAGNOSIS — J449 Chronic obstructive pulmonary disease, unspecified: Secondary | ICD-10-CM | POA: Insufficient documentation

## 2021-03-30 DIAGNOSIS — K219 Gastro-esophageal reflux disease without esophagitis: Secondary | ICD-10-CM | POA: Diagnosis not present

## 2021-03-30 DIAGNOSIS — Z79899 Other long term (current) drug therapy: Secondary | ICD-10-CM | POA: Diagnosis not present

## 2021-03-30 DIAGNOSIS — Z171 Estrogen receptor negative status [ER-]: Secondary | ICD-10-CM

## 2021-03-30 DIAGNOSIS — I158 Other secondary hypertension: Secondary | ICD-10-CM

## 2021-03-30 LAB — COMPREHENSIVE METABOLIC PANEL
ALT: 16 U/L (ref 0–44)
AST: 14 U/L — ABNORMAL LOW (ref 15–41)
Albumin: 4 g/dL (ref 3.5–5.0)
Alkaline Phosphatase: 96 U/L (ref 38–126)
Anion gap: 7 (ref 5–15)
BUN: 17 mg/dL (ref 8–23)
CO2: 26 mmol/L (ref 22–32)
Calcium: 9.6 mg/dL (ref 8.9–10.3)
Chloride: 108 mmol/L (ref 98–111)
Creatinine, Ser: 1.09 mg/dL — ABNORMAL HIGH (ref 0.44–1.00)
GFR, Estimated: 56 mL/min — ABNORMAL LOW (ref >60)
Glucose, Bld: 127 mg/dL — ABNORMAL HIGH (ref 70–99)
Potassium: 3.8 mmol/L (ref 3.5–5.1)
Sodium: 141 mmol/L (ref 135–145)
Total Bilirubin: 0.6 mg/dL (ref 0.3–1.2)
Total Protein: 7.1 g/dL (ref 6.5–8.1)

## 2021-03-30 LAB — CBC WITH DIFFERENTIAL/PLATELET
Abs Immature Granulocytes: 0.01 10*3/uL (ref 0.00–0.07)
Basophils Absolute: 0 10*3/uL (ref 0.0–0.1)
Basophils Relative: 1 %
Eosinophils Absolute: 0.1 10*3/uL (ref 0.0–0.5)
Eosinophils Relative: 2 %
HCT: 39.2 % (ref 36.0–46.0)
Hemoglobin: 12.4 g/dL (ref 12.0–15.0)
Immature Granulocytes: 0 %
Lymphocytes Relative: 21 %
Lymphs Abs: 1.2 10*3/uL (ref 0.7–4.0)
MCH: 28.4 pg (ref 26.0–34.0)
MCHC: 31.6 g/dL (ref 30.0–36.0)
MCV: 89.9 fL (ref 80.0–100.0)
Monocytes Absolute: 0.5 10*3/uL (ref 0.1–1.0)
Monocytes Relative: 9 %
Neutro Abs: 3.9 10*3/uL (ref 1.7–7.7)
Neutrophils Relative %: 67 %
Platelets: 301 10*3/uL (ref 150–400)
RBC: 4.36 MIL/uL (ref 3.87–5.11)
RDW: 13.7 % (ref 11.5–15.5)
WBC: 5.8 10*3/uL (ref 4.0–10.5)
nRBC: 0 % (ref 0.0–0.2)

## 2021-03-30 NOTE — Progress Notes (Signed)
[facility] up:    [name], MD [address] [place] [address]   DIAGNOSIS:  Cancer Staging  Malignant neoplasm of upper-outer quadrant of left breast in female, estrogen receptor negative ([place]) Staging form: Breast, AJCC 8th Edition - Clinical stage from 07/22/2018: Stage IA (cT1c, cN0, cM0, G3, ER-, PR-, HER2+) - Unsigned Stage prefix: Initial diagnosis Histologic grading system: 3 grade system - Pathologic stage from 09/16/2018: Stage IIA (pT2, pN0, cM0, G3, ER-, PR-, HER2+) - Unsigned Stage prefix: Initial diagnosis Histologic grading system: 3 grade system   SUMMARY OF ONCOLOGIC HISTORY:  [place], [place] woman status post left breast upper outer quadrant biopsy 07/13/2018 for a clinical T1c N0, stage IA invasive ductal carcinoma, grade 3, estrogen and progesterone receptor negative, HER-2 amplified, with an MIB-1 of 70%   (1) status post left mastectomy with sentinel lymph node sampling 09/16/2018 for a pT2, pN0, stage IIA invasive ductal carcinoma, grade 3, with negative margins             (a) a total of 6 sentinel lymph nodes were removed             (b) the patient opted against reconstruction             (c) status post wound debridement 04/01/2019 with benign pathology   (2) adjuvant chemotherapy with paclitaxel and trastuzumab weekly x12 started on 10/21/2018             (a) paclitaxel discontinued after 8 cycles due to peripheral neuropathy.   (3) trastuzumab continued every 21 days to total 1 year (through September 2021)             (a) echo 07/27/2018 shows an ejection fraction in the 55-60% range             (b) echo 01/11/2019 shows an ejection fraction in the 55-60% range             (c) echo 05/10/2019 shows an ejection fraction in the 55-60% range             (d) echocardiography 08/06/2019 and 09/28/2019 showed a 55 to 60% ejection   (4) no indication for postmastectomy radiation   (5) genetic testing on 07/22/2018  through the [facility] Common Hereditary cancer panel found no pathogenic mutations.in APC, ATM, AXIN2, BARD1, BMPR1A, BRCA1, BRCA2, BRIP1, CDH1, CDKN2A (p14ARF), CDKN2A (p16INK4a), CKD4, CHEK2, CTNNA1, DICER1, EPCAM (Deletion/duplication testing only), GREM1 (promoter region deletion/duplication testing only), KIT, MEN1, MLH1, MSH2, MSH3, MSH6, MUTYH, NBN, NF1, NHTL1, PALB2, PDGFRA, PMS2, POLD1, POLE, PTEN, RAD50, RAD51C, RAD51D, SDHB, SDHC, SDHD, SMAD4, SMARCA4. STK11, TP53, TSC1, TSC2, and VHL.  The following genes were evaluated for sequence changes only: SDHA and HOXB13 c.251G>A variant only.  CURRENT THERAPY: Observation  INTERVAL HISTORY: [name] 66 y.o. female returns for follow-up of her estrogen negative progesterone negative and HER2 positive breast cancer.  Her most recent right breast mammogram was completed at [facility] on July 20, 2020.  It showed no evidence of malignancy and her breast density was category C.  She is seeing Dr. Valeta Harms for her COPD which she tells me is stage 2.  She notes she is up to date with her immunizations.  She tells me that her main issue is shorntess of breath with her COPD, particularly around this time of year which is allergy season.    She has no breast or chest wall issues today.  She has no residual concerns from her  cancer treatment.     Patient Active Problem List   Diagnosis Date Noted   Acquired absence of left breast 10/15/2019   Genetic testing 07/30/2018   Family history of breast cancer    Family history of prostate cancer    Family history of lung cancer    Family history of esophageal cancer    Malignant neoplasm of upper-outer quadrant of left breast in female, estrogen receptor negative ([place]) 07/15/2018   CAD due to calcified coronary lesion 07/30/2014   HTN (hypertension) 11/08/2013   Abnormal nuclear stress test 10/16/2013   Abnormal ECG 09/15/2013   Hypercholesterolemia 09/15/2013   History of tobacco use 09/15/2013    Exertional dyspnea 09/15/2013    is allergic to benadryl [diphenhydramine hcl (sleep)] and sulfa antibiotics.  MEDICAL HISTORY: Past Medical History:  Diagnosis Date   Angina at rest [facility])    Anxiety    Cancer ([place]) 2000   left breast cancer-partial mastectomy   COPD (chronic obstructive pulmonary disease) ([place])    Depression    Family history of breast cancer    Family history of esophageal cancer    Family history of lung cancer    Family history of prostate cancer    GERD (gastroesophageal reflux disease)    Hyperlipidemia    Hypertension    Hypothyroidism    Recurrent breast cancer, left ([place])     SURGICAL HISTORY: Past Surgical History:  Procedure Laterality Date   ADENOIDECTOMY     APPLICATION OF WOUND VAC Left 04/01/2019   Procedure: Placement of provena wound vac;  Surgeon: Wallace Going, DO;  Location: [facility];  Service: Plastics;  Laterality: Left;   CARDIAC CATHETERIZATION     DEBRIDEMENT AND CLOSURE WOUND Left 04/01/2019   Procedure: Left breast wound excision and closure;  Surgeon: Wallace Going, DO;  Location: [facility];  Service: Plastics;  Laterality: Left;   LEFT HEART CATHETERIZATION WITH CORONARY ANGIOGRAM N/A 10/21/2013   Procedure: LEFT HEART CATHETERIZATION WITH CORONARY ANGIOGRAM;  Surgeon: Burnell Blanks, MD;  Location: [facility] CATH LAB;  Service: Cardiovascular;  Laterality: N/A;   MASTECTOMY W/ SENTINEL NODE BIOPSY Left 09/16/2018   Procedure: LEFT TOTAL MASTECTOMY WITH LEFT AXILLARY DEEP SENTINEL LYMPH NODE BIOPSY AND INJECT BLUE DYE;  Surgeon: Fanny Skates, MD;  Location: [facility];  Service: General;  Laterality: Left;   MASTECTOMY, PARTIAL Left 2000   PORT-A-CATH REMOVAL Right 10/20/2019   Procedure: REMOVAL PORT-A-CATH;  Surgeon: Coralie Keens, MD;  Location: [facility];  Service: General;  Laterality: Right;   PORTACATH PLACEMENT Right 09/16/2018   Procedure:  INSERTION PORT-A-CATH;  Surgeon: Fanny Skates, MD;  Location: [facility];  Service: General;  Laterality: Right;   TONSILLECTOMY      SOCIAL HISTORY: Social History   Socioeconomic History   Marital status: Single    Spouse name: Not on file   Number of children: Not on file   Years of education: Not on file   Highest education level: Not on file  Occupational History   Not on file  Tobacco Use   Smoking status: Former    Packs/day: 1.50    Years: 38.00    Pack years: 57.00    Types: Cigarettes    Start date: 32    Quit date: 01/21/2013    Years since quitting: 8.1   Smokeless tobacco: Never  Substance and Sexual Activity   Alcohol use: No   Drug use: No  Sexual activity: Not Currently    Birth control/protection: Post-menopausal  Other Topics Concern   Not on file  Social History Narrative   ** Merged History Encounter **       Social Determinants of Health   Financial Resource Strain: Not on file  Food Insecurity: Not on file  Transportation Needs: Not on file  Physical Activity: Not on file  Stress: Not on file  Social Connections: Not on file  Intimate Partner Violence: Not on file    FAMILY HISTORY: Family History  Problem Relation Age of Onset   Heart disease Mother    Hypertension Father    Cancer Father    Prostate cancer Father    Hypertension Sister    Breast cancer Sister        dx 1s   Heart disease Sister    Hypertension Brother    Hyperlipidemia Maternal Grandmother    Stroke Maternal Grandmother    Hypertension Brother    Breast cancer Maternal Aunt    Lung cancer Maternal Aunt    Esophageal cancer Maternal Aunt    Stomach cancer Paternal Grandmother     Review of Systems  Constitutional:  Negative for appetite change, chills, fatigue, fever and unexpected weight change.  HENT:   Negative for hearing loss, lump/mass and trouble swallowing.   Eyes:  Negative for eye problems and icterus.  Respiratory:  Negative  for chest tightness, cough and shortness of breath.   Cardiovascular:  Negative for chest pain, leg swelling and palpitations.  Gastrointestinal:  Negative for abdominal distention, abdominal pain, constipation, diarrhea, nausea and vomiting.  Endocrine: Negative for hot flashes.  Genitourinary:  Negative for difficulty urinating.   Musculoskeletal:  Negative for arthralgias.  Skin:  Negative for itching and rash.  Neurological:  Negative for dizziness, extremity weakness, headaches and numbness.  Hematological:  Negative for adenopathy. Does not bruise/bleed easily.  Psychiatric/Behavioral:  Negative for depression. The patient is not nervous/anxious.      PHYSICAL EXAMINATION  ECOG PERFORMANCE STATUS: 1 - Symptomatic but completely ambulatory  Vitals:   03/30/21 0833  BP: (!) 113/49  Pulse: 90  Resp: 18  Temp: (!) 97.5 F (36.4 C)  SpO2: 97%    Physical Exam Constitutional:      General: She is not in acute distress.    Appearance: Normal appearance. She is not toxic-appearing.  HENT:     Head: Normocephalic and atraumatic.  Eyes:     General: No scleral icterus. Cardiovascular:     Rate and Rhythm: Normal rate and regular rhythm.     Pulses: Normal pulses.     Heart sounds: Normal heart sounds.  Pulmonary:     Effort: Pulmonary effort is normal.     Breath sounds: Normal breath sounds.  Abdominal:     General: Abdomen is flat. Bowel sounds are normal. There is no distension.     Palpations: Abdomen is soft.     Tenderness: There is no abdominal tenderness.  Musculoskeletal:        General: No swelling.     Cervical back: Neck supple.  Lymphadenopathy:     Cervical: No cervical adenopathy.  Skin:    General: Skin is warm and dry.     Findings: No rash.  Neurological:     General: No focal deficit present.     Mental Status: She is alert.  Psychiatric:        Mood and Affect: Mood normal.  Behavior: Behavior normal.    LABORATORY DATA:  CBC     Component Value Date/Time   WBC 5.8 03/30/2021 0822   RBC 4.36 03/30/2021 0822   HGB 12.4 03/30/2021 0822   HGB 12.3 07/22/2018 0835   HGB 14.1 09/08/2006 1500   HCT 39.2 03/30/2021 0822   HCT 39.7 09/08/2006 1500   PLT 301 03/30/2021 0822   PLT 266 07/22/2018 0835   PLT 240 09/08/2006 1500   MCV 89.9 03/30/2021 0822   MCV 87.9 09/08/2006 1500   MCH 28.4 03/30/2021 0822   MCHC 31.6 03/30/2021 0822   RDW 13.7 03/30/2021 0822   RDW 12.0 09/08/2006 1500   LYMPHSABS 1.2 03/30/2021 0822   LYMPHSABS 1.8 09/08/2006 1500   MONOABS 0.5 03/30/2021 0822   MONOABS 0.5 09/08/2006 1500   EOSABS 0.1 03/30/2021 0822   EOSABS 0.1 09/08/2006 1500   BASOSABS 0.0 03/30/2021 0822   BASOSABS 0.1 09/08/2006 1500    CMP     Component Value Date/Time   NA 141 03/30/2021 0822   K 3.8 03/30/2021 0822   CL 108 03/30/2021 0822   CO2 26 03/30/2021 0822   GLUCOSE 127 (H) 03/30/2021 0822   BUN 17 03/30/2021 0822   CREATININE 1.09 (H) 03/30/2021 0822   CREATININE 1.11 (H) 07/22/2018 0835   CREATININE 0.97 10/18/2013 1527   CALCIUM 9.6 03/30/2021 0822   PROT 7.1 03/30/2021 0822   ALBUMIN 4.0 03/30/2021 0822   AST 14 (L) 03/30/2021 0822   AST 17 07/22/2018 0835   ALT 16 03/30/2021 0822   ALT 20 07/22/2018 0835   ALKPHOS 96 03/30/2021 0822   BILITOT 0.6 03/30/2021 0822   BILITOT 0.6 07/22/2018 0835   GFRNONAA 56 (L) 03/30/2021 0822   GFRNONAA 53 (L) 07/22/2018 0835   GFRAA >60 10/18/2019 1100   GFRAA >60 07/22/2018 0835     ASSESSMENT and THERAPY PLAN:   Malignant neoplasm of upper-outer quadrant of left breast in female, estrogen receptor negative ([place]) Manette is a 66 year old woman with pathologic stage IIa ER negative, PR negative, HER2 positive breast cancer diagnosed in June 2020, status post left mastectomy, adjuvant chemotherapy, maintenance trastuzumab, and no indication for adjuvant radiation.  1.  Stage IIa HER2 positive breast cancer: She has no clinical or radiographic sign of  breast cancer recurrence.  She continues on observation alone and is due for her next right breast screening mammogram in June 2023.  Her labs today were normal.  #2 history of COPD: This is being followed by Dr. Valeta Harms.  I reviewed her immunizations which are not listed in epic and we are going to reach out to her primary care and get those updated in our medical record.  3.  Health maintenance: We are going to update her cancer screenings and immunizations in her health maintenance section of epic.  I recommended continued healthy diet and exercise.  We will see Tyshae back in 1 years time for continued follow-up   All questions were answered. The patient knows to call the clinic with any problems, questions or concerns. We can certainly see the patient much sooner if necessary.  Total encounter time: 30 minutes in face-to-face visit time, chart review, lab review, care coordination, order entry, discussion with nursing staff, and documentation of the encounter.  Wilber Bihari, NP 03/30/21 9:35 AM Medical Oncology and Hematology [facility] [place], [place] [address] Tel. [phone]    Fax. [phone]  *Total Encounter Time as defined by the Centers  for Medicare and Medicaid Services includes, in addition to the face-to-face time of a patient visit (documented in the note above) non-face-to-face time: obtaining and reviewing outside history, ordering and reviewing medications, tests or procedures, care coordination (communications with other health care professionals or caregivers) and documentation in the medical record.

## 2021-03-30 NOTE — Assessment & Plan Note (Signed)
Dorothy Frederick is a 66 year old woman with pathologic stage IIa ER negative, PR negative, HER2 positive breast cancer diagnosed in June 2020, status post left mastectomy, adjuvant chemotherapy, maintenance trastuzumab, and no indication for adjuvant radiation. ? ?1.  Stage IIa HER2 positive breast cancer: She has no clinical or radiographic sign of breast cancer recurrence.  She continues on observation alone and is due for her next right breast screening mammogram in June 2023.  Her labs today were normal. ? ?#2 history of COPD: This is being followed by Dr. Valeta Harms.  I reviewed her immunizations which are not listed in epic and we are going to reach out to her primary care and get those updated in our medical record. ? ?3.  Health maintenance: We are going to update her cancer screenings and immunizations in her health maintenance section of epic.  I recommended continued healthy diet and exercise. ? ?We will see Dorothy Frederick back in 1 years time for continued follow-up ?

## 2021-05-04 DIAGNOSIS — Z961 Presence of intraocular lens: Secondary | ICD-10-CM | POA: Diagnosis not present

## 2021-07-23 DIAGNOSIS — R922 Inconclusive mammogram: Secondary | ICD-10-CM | POA: Diagnosis not present

## 2021-07-23 DIAGNOSIS — R928 Other abnormal and inconclusive findings on diagnostic imaging of breast: Secondary | ICD-10-CM | POA: Diagnosis not present

## 2021-07-23 DIAGNOSIS — Z780 Asymptomatic menopausal state: Secondary | ICD-10-CM | POA: Diagnosis not present

## 2021-07-23 DIAGNOSIS — M8589 Other specified disorders of bone density and structure, multiple sites: Secondary | ICD-10-CM | POA: Diagnosis not present

## 2021-07-30 DIAGNOSIS — J449 Chronic obstructive pulmonary disease, unspecified: Secondary | ICD-10-CM | POA: Diagnosis not present

## 2021-07-30 DIAGNOSIS — M8580 Other specified disorders of bone density and structure, unspecified site: Secondary | ICD-10-CM | POA: Diagnosis not present

## 2021-09-17 DIAGNOSIS — E782 Mixed hyperlipidemia: Secondary | ICD-10-CM | POA: Diagnosis not present

## 2021-09-17 DIAGNOSIS — E559 Vitamin D deficiency, unspecified: Secondary | ICD-10-CM | POA: Diagnosis not present

## 2021-09-17 DIAGNOSIS — Z114 Encounter for screening for human immunodeficiency virus [HIV]: Secondary | ICD-10-CM | POA: Diagnosis not present

## 2021-09-17 DIAGNOSIS — Z0000 Encounter for general adult medical examination without abnormal findings: Secondary | ICD-10-CM | POA: Diagnosis not present

## 2021-09-20 DIAGNOSIS — E782 Mixed hyperlipidemia: Secondary | ICD-10-CM | POA: Diagnosis not present

## 2021-09-20 DIAGNOSIS — E039 Hypothyroidism, unspecified: Secondary | ICD-10-CM | POA: Diagnosis not present

## 2021-09-20 DIAGNOSIS — J45901 Unspecified asthma with (acute) exacerbation: Secondary | ICD-10-CM | POA: Diagnosis not present

## 2021-09-20 DIAGNOSIS — E559 Vitamin D deficiency, unspecified: Secondary | ICD-10-CM | POA: Diagnosis not present

## 2021-09-27 DIAGNOSIS — Z1211 Encounter for screening for malignant neoplasm of colon: Secondary | ICD-10-CM | POA: Diagnosis not present

## 2021-09-27 DIAGNOSIS — Z23 Encounter for immunization: Secondary | ICD-10-CM | POA: Diagnosis not present

## 2021-09-27 DIAGNOSIS — J441 Chronic obstructive pulmonary disease with (acute) exacerbation: Secondary | ICD-10-CM | POA: Diagnosis not present

## 2021-09-27 DIAGNOSIS — Z0000 Encounter for general adult medical examination without abnormal findings: Secondary | ICD-10-CM | POA: Diagnosis not present

## 2021-10-01 IMAGING — CT CT CARDIAC CORONARY ARTERY CALCIUM SCORE
3 series · 14 of 20 positions shown, 16 images · non-contrast
Comparison: 02/18/2020 calcium score CT.

CLINICAL DATA: Hypertension. Hyperlipidemia. Family history of
heart disease.

EXAM:
CT CARDIAC CORONARY ARTERY CALCIUM SCORE
TECHNIQUE: Non-contrast imaging through the heart was performed using
prospective ECG gating. Image post processing was performed on an
independent workstation, allowing for quantitative analysis of the
heart and coronary arteries. Note that this exam targets the heart
and the chest was not imaged in its entirety.

[Series 2: calcium scoring 2.00 qr36 bestdiast 68% hrt calciu · axial · 0.37mm/px · z∈[+1588,+1660]mm · 4 of 60 slices shown]
[im 12/60  vessel]
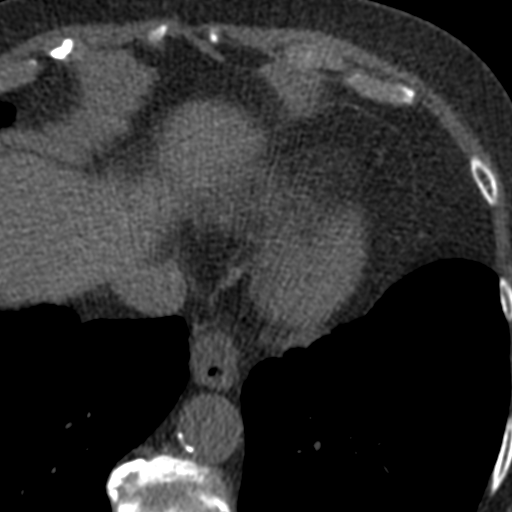
[im 24/60  vessel]
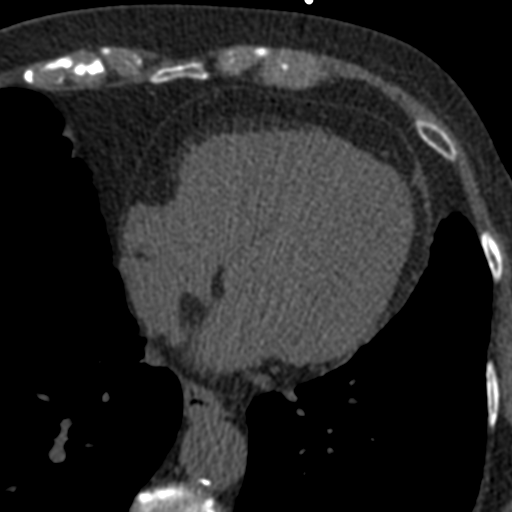
[im 36/60  vessel]
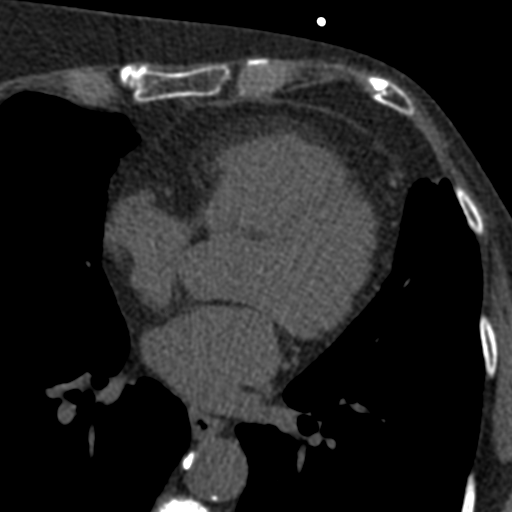
[im 48/60  vessel]
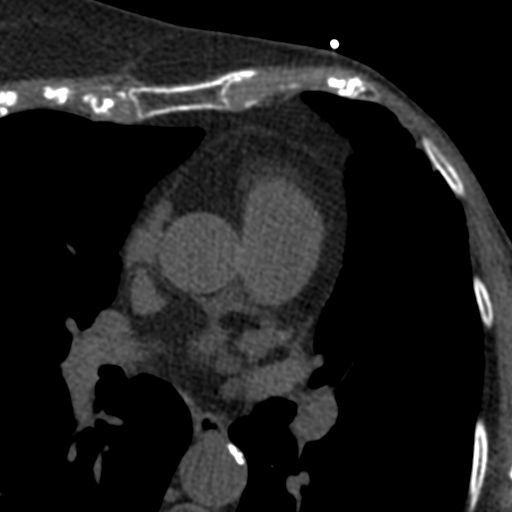

[Series 3: calcium scoring 2.00 br40 bestdiast 68% axial · axial · 0.57mm/px · z∈[+1584,+1664]mm · 5 of 60 slices shown, 7 images]
[im 10/60  vessel]
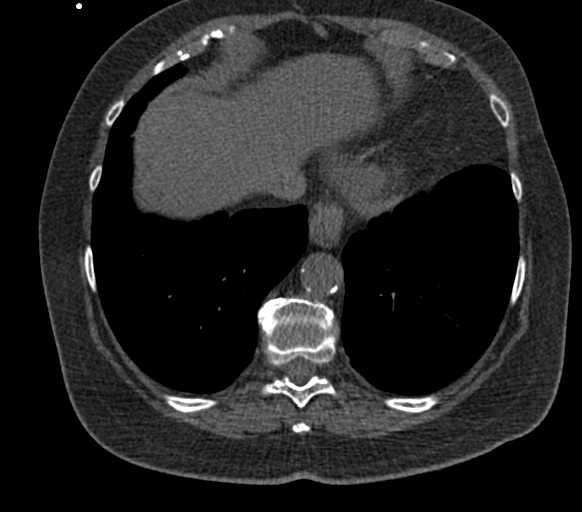
[im 10/60  lung]
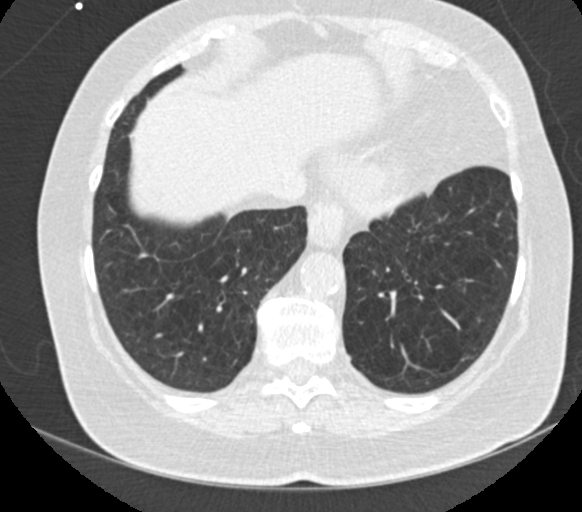
[im 20/60  vessel]
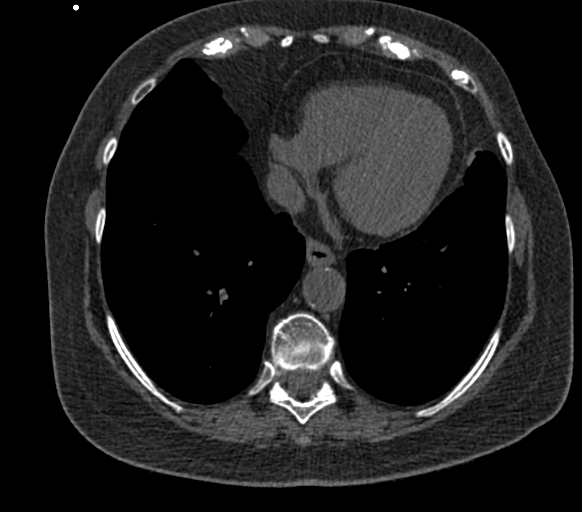
[im 30/60  vessel]
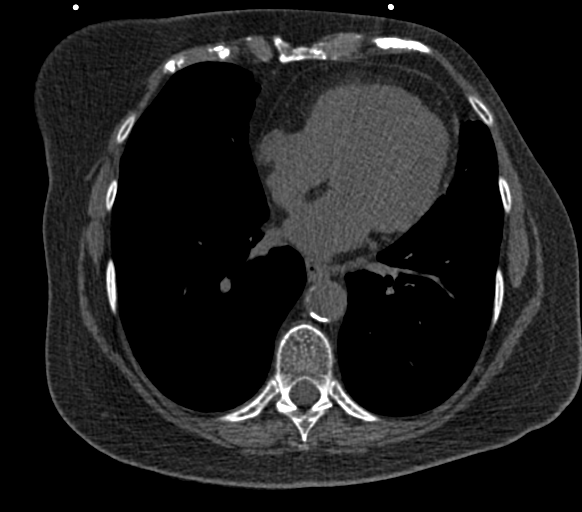
[im 40/60  vessel]
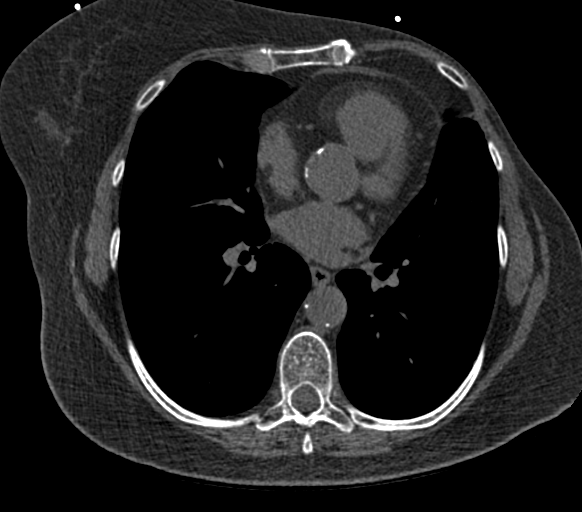
[im 50/60  vessel]
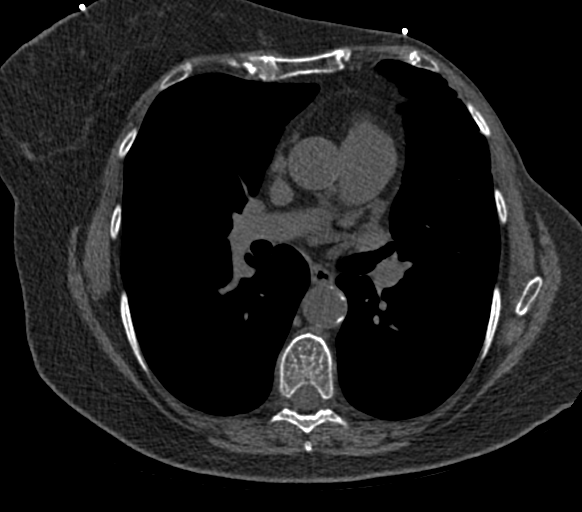
[im 50/60  lung]
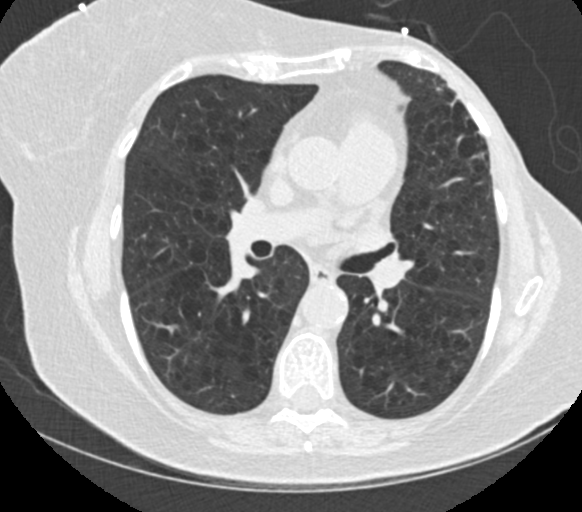

[Series 9: calcium scoring 2.00 br60 bestdiast 68% lungs · axial · 0.57mm/px · z∈[+1584,+1664]mm · 5 of 60 slices shown]
[im 10/60  vessel]
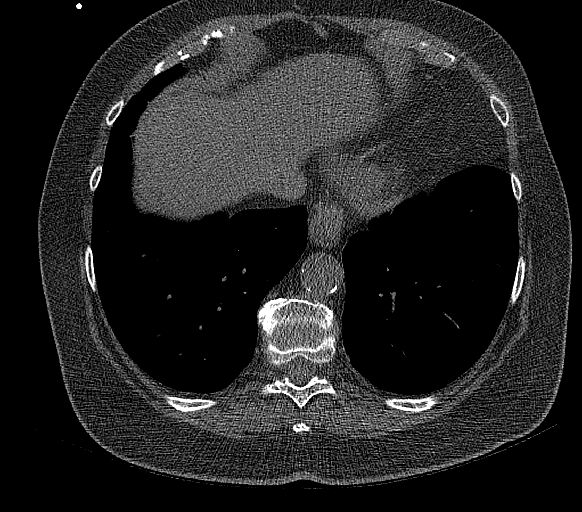
[im 20/60  vessel]
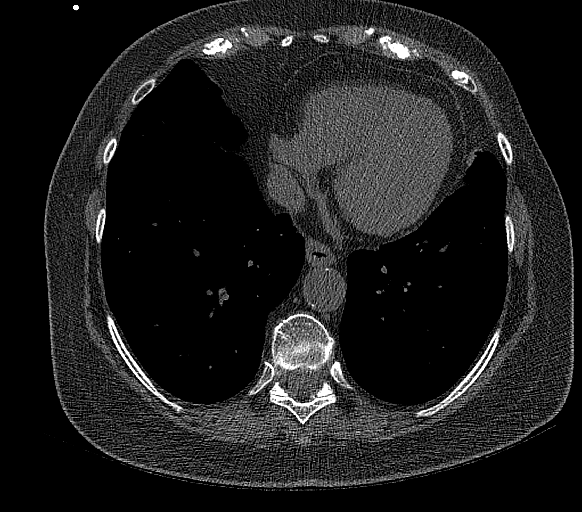
[im 30/60  vessel]
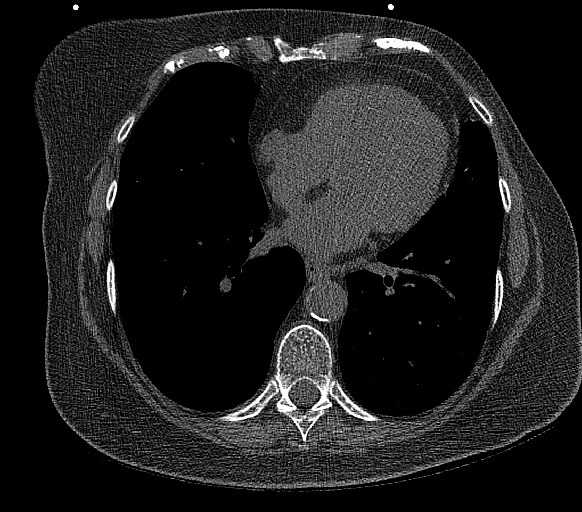
[im 40/60  vessel]
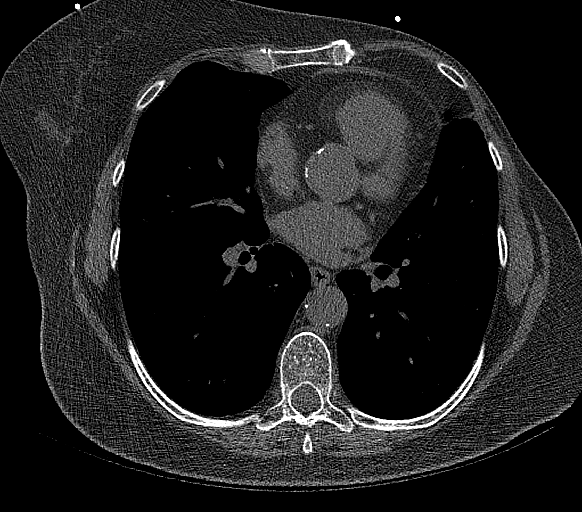
[im 50/60  vessel]
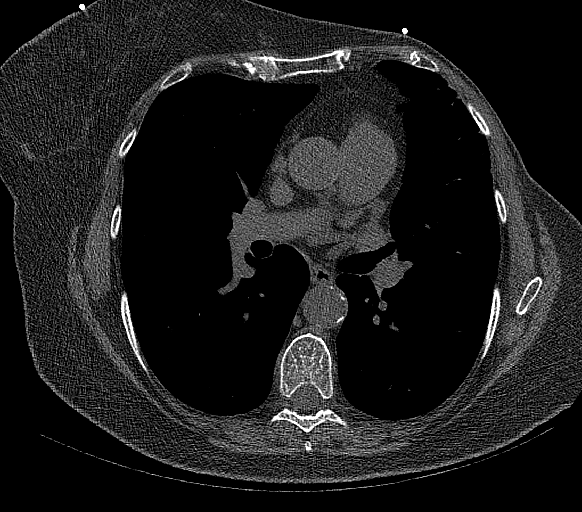

[14 of 20 positions shown; findings below may reference images not displayed]

FINDINGS: CORONARY CALCIUM SCORES:

Left Main: 0

LAD:

LCx:

RCA:

Total Agatston Score:

[HOSPITAL] percentile: 76th

AORTA MEASUREMENTS:

Ascending Aorta: 29 mm

Descending Aorta: 23 mm

OTHER FINDINGS:

Cardiovascular: Aortic atherosclerosis. Mild cardiomegaly, without
pericardial effusion.

Mediastinum/Nodes: No imaged thoracic adenopathy. Tiny hiatal
hernia.

Lungs/Pleura: No pleural fluid. Moderate bullous emphysema. No
consolidation. Pulmonary nodules are evaluated on dedicated lung
cancer screening CTs.

Upper Abdomen: Hepatic cysts.

Musculoskeletal: Left mastectomy.  No acute osseous abnormality.
IMPRESSION: 1. Total Agatston score of 64.2, corresponding to 76th percentile
for age, sex, and race based cohort.
2. Aortic Atherosclerosis (9A9XL-DWI.I) and Emphysema ([id number].Y).
3.  Tiny hiatal hernia.

## 2021-10-03 DIAGNOSIS — J441 Chronic obstructive pulmonary disease with (acute) exacerbation: Secondary | ICD-10-CM | POA: Diagnosis not present

## 2021-10-16 DIAGNOSIS — R635 Abnormal weight gain: Secondary | ICD-10-CM | POA: Diagnosis not present

## 2021-10-16 DIAGNOSIS — I10 Essential (primary) hypertension: Secondary | ICD-10-CM | POA: Diagnosis not present

## 2021-10-16 DIAGNOSIS — J441 Chronic obstructive pulmonary disease with (acute) exacerbation: Secondary | ICD-10-CM | POA: Diagnosis not present

## 2021-11-15 ENCOUNTER — Ambulatory Visit (INDEPENDENT_AMBULATORY_CARE_PROVIDER_SITE_OTHER): Payer: BC Managed Care – PPO | Admitting: Pulmonary Disease

## 2021-11-15 ENCOUNTER — Encounter: Payer: Self-pay | Admitting: Pulmonary Disease

## 2021-11-15 VITALS — BP 118/60 | HR 85 | Temp 98.1°F | Ht 64.0 in | Wt 174.8 lb

## 2021-11-15 DIAGNOSIS — J449 Chronic obstructive pulmonary disease, unspecified: Secondary | ICD-10-CM

## 2021-11-15 DIAGNOSIS — Z853 Personal history of malignant neoplasm of breast: Secondary | ICD-10-CM | POA: Diagnosis not present

## 2021-11-15 DIAGNOSIS — Z87891 Personal history of nicotine dependence: Secondary | ICD-10-CM | POA: Diagnosis not present

## 2021-11-15 DIAGNOSIS — J432 Centrilobular emphysema: Secondary | ICD-10-CM

## 2021-11-15 DIAGNOSIS — J4489 Other specified chronic obstructive pulmonary disease: Secondary | ICD-10-CM | POA: Diagnosis not present

## 2021-11-15 DIAGNOSIS — Z800 Family history of malignant neoplasm of digestive organs: Secondary | ICD-10-CM

## 2021-11-15 DIAGNOSIS — Z801 Family history of malignant neoplasm of trachea, bronchus and lung: Secondary | ICD-10-CM

## 2021-11-15 DIAGNOSIS — Z803 Family history of malignant neoplasm of breast: Secondary | ICD-10-CM

## 2021-11-15 LAB — CBC WITH DIFFERENTIAL/PLATELET
Basophils Absolute: 0 10*3/uL (ref 0.0–0.1)
Basophils Relative: 0.8 % (ref 0.0–3.0)
Eosinophils Absolute: 0.1 10*3/uL (ref 0.0–0.7)
Eosinophils Relative: 1.6 % (ref 0.0–5.0)
HCT: 38.2 % (ref 36.0–46.0)
Hemoglobin: 12.4 g/dL (ref 12.0–15.0)
Lymphocytes Relative: 19 % (ref 12.0–46.0)
Lymphs Abs: 1.1 10*3/uL (ref 0.7–4.0)
MCHC: 32.4 g/dL (ref 30.0–36.0)
MCV: 88.3 fl (ref 78.0–100.0)
Monocytes Absolute: 0.6 10*3/uL (ref 0.1–1.0)
Monocytes Relative: 9.9 % (ref 3.0–12.0)
Neutro Abs: 3.8 10*3/uL (ref 1.4–7.7)
Neutrophils Relative %: 68.7 % (ref 43.0–77.0)
Platelets: 268 10*3/uL (ref 150.0–400.0)
RBC: 4.33 Mil/uL (ref 3.87–5.11)
RDW: 15.4 % (ref 11.5–15.5)
WBC: 5.6 10*3/uL (ref 4.0–10.5)

## 2021-11-15 MED ORDER — BREZTRI AEROSPHERE 160-9-4.8 MCG/ACT IN AERO: 2.0000 | INHALATION_SPRAY | Freq: Two times a day (BID) | RESPIRATORY_TRACT | 5 refills | Status: DC

## 2021-11-15 MED ORDER — FLUTICASONE PROPIONATE 50 MCG/ACT NA SUSP: 2.0000 | Freq: Every day | NASAL | 5 refills | Status: DC

## 2021-11-15 MED ORDER — FEXOFENADINE HCL 180 MG PO TABS: 180.0000 mg | ORAL_TABLET | Freq: Every day | ORAL | 5 refills | Status: DC

## 2021-11-15 MED ORDER — MONTELUKAST SODIUM 10 MG PO TABS: 10.0000 mg | ORAL_TABLET | Freq: Every day | ORAL | 5 refills | Status: DC

## 2021-11-15 MED ORDER — BREZTRI AEROSPHERE 160-9-4.8 MCG/ACT IN AERO: 2.0000 | INHALATION_SPRAY | Freq: Two times a day (BID) | RESPIRATORY_TRACT | 0 refills | Status: DC

## 2021-11-15 NOTE — Progress Notes (Signed)
Synopsis: Referred in August 2022 for lung nodule, pleural effusion, PCP: By [name], MD  Subjective:   PATIENT ID: Dorothy Frederick GENDER: female DOB: [birth year], MRN: [id number]  Chief Complaint  Patient presents with   Follow-up    Cough and SOB. Sx x 5 months. Sx exacerbated last 2 days.    This is a 66 year old female, history of left breast cancer, partial mastectomy in 2000, COPD, family history of breast cancer, esophageal cancer, lung cancer.  Patient has hyperlipidemia and hypertension.She was seen in the emergency department on 09/08/2020.  CT imaging in the emergency department was complete which revealed a 1.6 cm nodular opacity within the right middle lobe, moderate to severe emphysema and a small right-sided pleural effusion.  Patient was referred here for follow-up to discuss next steps.  As well as to establish care with pulmonologist.  OV 09/13/2020: Has no respiratory complaints today.  Was seen in the ER for right-sided chest pains.  From respiratory standpoint she feels okay.  Chest pains have resolved.  Reviewed CT imaging today in the office with the patient.  She is already enrolled in her lung cancer screening that was last completed in January.  She is a former smoker.  OV 12/11/2020: here today after PFT follow up.  Patient had pulmonary function test completed prior to today's office visit which revealed a reduced ratio of 60, FEV1 of 68% predicted, no significant bronchodilator response, total lung volumes within normal limits, severely reduced DLCO at 39% predicted.  Using Trelegy daily and as needed albuterol for her COPD management.  She had a CT scan of the chest in October 2022 which revealed resolution of the right middle lobe nodule that was initially seen and sent for referral.  OV 11/15/2021: This is a 66 year old female here today for follow-up.  She has COPD with a DLCO of 39% predicted.  She been on Trelegy.  She has had recurrent exacerbations she  is on antibiotics and steroids.  Last time she is on steroids about a month ago.  She still having increased shortness of breath and nocturnal symptoms.  She still having ongoing shortness of breath not really sure what to do next.  She does have allergies she has had some asthma symptoms in the past her prior CBC with differential has total eosinophil count of 200.   Oncology History  Malignant neoplasm of upper-outer quadrant of left breast in female, estrogen receptor negative ([place])  07/15/2018 Initial Diagnosis   Malignant neoplasm of upper-outer quadrant of left breast in female, estrogen receptor negative ([place])   07/29/2018 Genetic Testing   Genetic testing reported out on 07/29/2018 through the [facility] Common Hereditary cancer panel found no pathogenic mutations.   The Common Hereditary Cancers Panel offered by [facility] includes sequencing and/or deletion duplication testing of the following 47 genes: APC, ATM, AXIN2, BARD1, BMPR1A, BRCA1, BRCA2, BRIP1, CDH1, CDKN2A (p14ARF), CDKN2A (p16INK4a), CKD4, CHEK2, CTNNA1, DICER1, EPCAM (Deletion/duplication testing only), GREM1 (promoter region deletion/duplication testing only), KIT, MEN1, MLH1, MSH2, MSH3, MSH6, MUTYH, NBN, NF1, NHTL1, PALB2, PDGFRA, PMS2, POLD1, POLE, PTEN, RAD50, RAD51C, RAD51D, SDHB, SDHC, SDHD, SMAD4, SMARCA4. STK11, TP53, TSC1, TSC2, and VHL.  The following genes were evaluated for sequence changes only: SDHA and HOXB13 c.251G>A variant only.   09/16/2018 Surgery   Left mastectomy [facility]) [phone]): invasive ductal carcinoma, grade 3, with high grade DCIS. Negative margins. 6 lymph nodes were negative for carcinoma. ER/PR negative. Her-2 positive. Ki67 70%.   10/21/2018 - 10/06/2019 Chemotherapy  PACLitaxel (TAXOL) 150 mg in sodium chloride 0.9 % 250 mL chemo infusion (</= 23m/m2), 80 mg/m2 = 150 mg, Intravenous,  Once, 2 of 2 cycles. Administration: 150 mg (10/21/2018), 150 mg (10/29/2018), 150 mg (11/19/2018), 150 mg  (11/05/2018), 150 mg (11/12/2018), 150 mg (11/26/2018), 150 mg (12/10/2018), 150 mg (12/16/2018).  trastuzumab-dkst (OGIVRI) 300 mg in sodium chloride 0.9 % 250 mL chemo infusion, 315 mg, Intravenous,  Once, 16 of 16 cycles. Administration: 300 mg (10/21/2018), 150 mg (11/19/2018), 450 mg (12/24/2018), 450 mg (01/13/2019), 450 mg (03/17/2019), 450 mg (04/07/2019), 450 mg (06/09/2019), 450 mg (06/30/2019), 450 mg (09/08/2019), 150 mg (11/05/2018), 150 mg (11/12/2018), 150 mg (11/26/2018), 150 mg (12/10/2018), 150 mg (12/16/2018), 450 mg (02/03/2019), 450 mg (02/24/2019), 450 mg (04/28/2019), 450 mg (05/19/2019), 450 mg (07/28/2019), 450 mg (08/18/2019), 450 mg (10/06/2019).       Past Medical History:  Diagnosis Date   Angina at rest    Anxiety    Cancer ([facility] 2000   left breast cancer-partial mastectomy   COPD (chronic obstructive pulmonary disease) (HCC)    Depression    Family history of breast cancer    Family history of esophageal cancer    Family history of lung cancer    Family history of prostate cancer    GERD (gastroesophageal reflux disease)    Hyperlipidemia    Hypertension    Hypothyroidism    Recurrent breast cancer, left ([facility]      Family History  Problem Relation Age of Onset   Heart disease Mother    Hypertension Father    Cancer Father    Prostate cancer Father    Hypertension Sister    Breast cancer Sister        dx 562s  Heart disease Sister    Hypertension Brother    Hyperlipidemia Maternal Grandmother    Stroke Maternal Grandmother    Hypertension Brother    Breast cancer Maternal Aunt    Lung cancer Maternal Aunt    Esophageal cancer Maternal Aunt    Stomach cancer Paternal Grandmother      Past Surgical History:  Procedure Laterality Date   ADENOIDECTOMY     APPLICATION OF WOUND VAC Left 04/01/2019   Procedure: Placement of provena wound vac;  Surgeon: DWallace Going DO;  [place]  Service: Plastics;  Laterality: Left;   CARDIAC  CATHETERIZATION     DEBRIDEMENT AND CLOSURE WOUND Left 04/01/2019   Procedure: Left breast wound excision and closure;  Surgeon: DWallace Going DO;  [place]  Service: Plastics;  Laterality: Left;   LEFT HEART CATHETERIZATION WITH CORONARY ANGIOGRAM N/A 10/21/2013   Procedure: LEFT HEART CATHETERIZATION WITH CORONARY ANGIOGRAM;  Surgeon: CBurnell Blanks MD;  [facility];  Service: Cardiovascular;  Laterality: N/A;   MASTECTOMY W/ SENTINEL NODE BIOPSY Left 09/16/2018   Procedure: LEFT TOTAL MASTECTOMY WITH LEFT AXILLARY DEEP SENTINEL LYMPH NODE BIOPSY AND INJECT BLUE DYE;  Surgeon: IFanny Skates MD;  [place] [facility]  Service: General;  Laterality: Left;   MASTECTOMY, PARTIAL Left 2000   PORT-A-CATH REMOVAL Right 10/20/2019   Procedure: REMOVAL PORT-A-CATH;  Surgeon: BCoralie Keens MD;  [place] [facility]  Service: General;  Laterality: Right;   PORTACATH PLACEMENT Right 09/16/2018   Procedure: INSERTION PORT-A-CATH;  Surgeon: IFanny Skates MD;  [place]  Service: General;  Laterality: Right;   TONSILLECTOMY      Social History  Socioeconomic History   Marital status: Single    Spouse name: Not on file   Number of children: Not on file   Years of education: Not on file   Highest education level: Not on file  Occupational History   Not on file  Tobacco Use   Smoking status: Former    Packs/day: 1.50    Years: 38.00    Total pack years: 57.00    Types: Cigarettes    Start date: 60    Quit date: 01/21/2013    Years since quitting: 8.8   Smokeless tobacco: Never  Substance and Sexual Activity   Alcohol use: No   Drug use: No   Sexual activity: Not Currently    Birth control/protection: Post-menopausal  Other Topics Concern   Not on file  Social History Narrative   ** Merged History Encounter **       Social Determinants of Health   Financial Resource  Strain: Not on file  Food Insecurity: Not on file  Transportation Needs: Not on file  Physical Activity: Not on file  Stress: Not on file  Social Connections: Not on file  Intimate Partner Violence: Not on file     Allergies  Allergen Reactions   Benadryl [Diphenhydramine Hcl (Sleep)] Palpitations    Increased heartrate   Sulfa Antibiotics Rash     Outpatient Medications Prior to Visit  Medication Sig Dispense Refill   albuterol (PROVENTIL HFA;VENTOLIN HFA) 108 (90 BASE) MCG/ACT inhaler Inhale 2 puffs into the lungs every 6 (six) hours as needed for wheezing or shortness of breath.     atorvastatin (LIPITOR) 40 MG tablet Take 40 mg by mouth daily.     Cholecalciferol 125 MCG (5000 UT) TABS Take 5,000 Units by mouth daily.     escitalopram (LEXAPRO) 10 MG tablet Take 10 mg by mouth daily.  3   famotidine (PEPCID) 10 MG tablet Take 10 mg by mouth at bedtime.     Fluticasone-Umeclidin-Vilant (TRELEGY ELLIPTA) 200-62.5-25 MCG/INH AEPB      ibuprofen (ADVIL) 200 MG tablet Take 400 mg by mouth every 6 (six) hours as needed for headache or moderate pain.     levothyroxine (SYNTHROID) 75 MCG tablet Take 75 mcg by mouth See admin instructions. Alternate every other day with 50 mcg     levothyroxine (SYNTHROID, LEVOTHROID) 50 MCG tablet Take 1 tablet by mouth every other day. Alternate every other day  with 75 mcg     Multiple Vitamins-Minerals (WOMENS 50+ MULTI VITAMIN/MIN PO)      olmesartan (BENICAR) 20 MG tablet Take 20 mg by mouth daily.     atorvastatin (LIPITOR) 20 MG tablet Take 40 mg by mouth at bedtime. (Patient not taking: Reported on 11/15/2021)     fluticasone (FLONASE) 50 MCG/ACT nasal spray Place 2 sprays into both nostrils daily. (Patient not taking: Reported on 11/15/2021)     furosemide (LASIX) 20 MG tablet TAKE ONE TABLET AS NEEDED FOR WEIGHT GAIN OF 2 POUNDS IN 24 HOURS OR 5 POUNDS IN 7 DAYS (Patient not taking: Reported on 11/15/2021) 30 tablet 1   Facility-Administered  Medications Prior to Visit  Medication Dose Route Frequency Provider Last Rate Last Admin   0.9 %  sodium chloride infusion   Intravenous Continuous Causey, Charlestine Massed, NP   Stopped at 12/24/18 1548    Review of Systems  Constitutional:  Negative for chills, fever, malaise/fatigue and weight loss.  HENT:  Negative for hearing loss, sore throat and tinnitus.   Eyes:  Negative for blurred vision and double vision.  Respiratory:  Positive for cough, shortness of breath and wheezing. Negative for hemoptysis, sputum production and stridor.   Cardiovascular:  Negative for chest pain, palpitations, orthopnea, leg swelling and PND.  Gastrointestinal:  Negative for abdominal pain, constipation, diarrhea, heartburn, nausea and vomiting.  Genitourinary:  Negative for dysuria, hematuria and urgency.  Musculoskeletal:  Negative for joint pain and myalgias.  Skin:  Negative for itching and rash.  Neurological:  Negative for dizziness, tingling, weakness and headaches.  Endo/Heme/Allergies:  Negative for environmental allergies. Does not bruise/bleed easily.  Psychiatric/Behavioral:  Negative for depression. The patient is not nervous/anxious and does not have insomnia.   All other systems reviewed and are negative.    Objective:  Physical Exam Vitals reviewed.  Constitutional:      General: She is not in acute distress.    Appearance: She is well-developed.  HENT:     Head: Normocephalic and atraumatic.  Eyes:     General: No scleral icterus.    Conjunctiva/sclera: Conjunctivae normal.     Pupils: Pupils are equal, round, and reactive to light.  Neck:     Vascular: No JVD.     Trachea: No tracheal deviation.  Cardiovascular:     Rate and Rhythm: Normal rate and regular rhythm.     Heart sounds: Normal heart sounds. No murmur heard. Pulmonary:     Effort: Pulmonary effort is normal. No tachypnea, accessory muscle usage or respiratory distress.     Breath sounds: No stridor. Wheezing  present. No rhonchi or rales.  Abdominal:     General: There is no distension.     Palpations: Abdomen is soft.     Tenderness: There is no abdominal tenderness.  Musculoskeletal:        General: No tenderness.     Cervical back: Neck supple.  Lymphadenopathy:     Cervical: No cervical adenopathy.  Skin:    General: Skin is warm and dry.     Capillary Refill: Capillary refill takes less than 2 seconds.     Findings: No rash.  Neurological:     Mental Status: She is alert and oriented to person, place, and time.  Psychiatric:        Behavior: Behavior normal.      Vitals:   11/15/21 1137  BP: 118/60  Pulse: 85  Temp: 98.1 F (36.7 C)  TempSrc: Oral  SpO2: 97%  Weight: 174 lb 12.8 oz (79.3 kg)  Height: '5\' 4"'  (1.626 m)   97% on RA BMI Readings from Last 3 Encounters:  11/15/21 30.00 kg/m  03/30/21 30.50 kg/m  12/11/20 30.82 kg/m   Wt Readings from Last 3 Encounters:  11/15/21 174 lb 12.8 oz (79.3 kg)  03/30/21 172 lb 3.2 oz (78.1 kg)  12/11/20 174 lb (78.9 kg)     CBC    Component Value Date/Time   WBC 5.8 03/30/2021 0822   RBC 4.36 03/30/2021 0822   HGB 12.4 03/30/2021 0822   HGB 12.3 07/22/2018 0835   HGB 14.1 09/08/2006 1500   HCT 39.2 03/30/2021 0822   HCT 39.7 09/08/2006 1500   PLT 301 03/30/2021 0822   PLT 266 07/22/2018 0835   PLT 240 09/08/2006 1500   MCV 89.9 03/30/2021 0822   MCV 87.9 09/08/2006 1500   MCH 28.4 03/30/2021 0822   MCHC 31.6 03/30/2021 0822   RDW 13.7 03/30/2021 0822   RDW 12.0 09/08/2006 1500   LYMPHSABS 1.2 03/30/2021 0822   LYMPHSABS  1.8 09/08/2006 1500   MONOABS 0.5 03/30/2021 0822   MONOABS 0.5 09/08/2006 1500   EOSABS 0.1 03/30/2021 0822   EOSABS 0.1 09/08/2006 1500   BASOSABS 0.0 03/30/2021 0822   BASOSABS 0.1 09/08/2006 1500     Chest Imaging: 09/08/2020 CT chest: Right middle lobe nodular opacity, severe to moderate emphysema throughout the lung, centrilobular. The patient's images have been independently  reviewed by me.    October CT chest: 2022: Right middle lobe nodular opacity resolved in comparison to previous images.  I showed this today to the patient in the office.  Severe centrilobular emphysema. The patient's images have been independently reviewed by me.     Pulmonary Functions Testing Results:    Latest Ref Rng & Units 12/11/2020    8:58 AM  PFT Results  FVC-Pre L 2.65   FVC-Predicted Pre % 85   FVC-Post L 2.69   FVC-Predicted Post % 86   Pre FEV1/FVC % % 59   Post FEV1/FCV % % 60   FEV1-Pre L 1.55   FEV1-Predicted Pre % 65   FEV1-Post L 1.62   DLCO uncorrected ml/min/mmHg 7.62   DLCO UNC% % 39   DLCO corrected ml/min/mmHg 7.62   DLCO COR %Predicted % 39   DLVA Predicted % 46   TLC L 5.01   TLC % Predicted % 100   RV % Predicted % 104     FeNO:   Pathology:   Echocardiogram:   Heart Catheterization:     Assessment & Plan:     ICD-10-CM   1. Stage 2 moderate COPD by GOLD classification ([place])  J44.9     2. Asthma-COPD overlap syndrome  J44.89     3. History of tobacco use  Z87.891     4. History of breast cancer, left, status post partial mastectomy and XRT  Z85.3     5. Family history of lung cancer  Z80.1     6. Family history of breast cancer  Z80.3     7. Family history of esophageal cancer  Z80.0     8. Centrilobular emphysema ([place])  J43.2       Discussion:  This is a 66 year old female, CT evidence of centrilobular emphysema, pulmonary function testing completed with stage II COPD.  She probably has a history of underlying asthma symptoms as well usually flares up related to exposures and allergies.  I think we need to be low bit more aggressive at treating those now. . Plan: Switch from Trelegy to [facility] plus spacer, samples and new prescription today. Check blood work to include regional allergy panel plus CBC with differential. Start Singulair 10 mg daily Allegra 180 mg daily Continue Flonase If her regional allergy panel and  CBC with differential are concerning or have underlying potential for type II inflammation could consider biologic injection. We will have her follow-up with APP in 4 weeks.  If her lab results are consistent with that would be okay with starting [place]. We talked about these various options today in the office.  RTC 4 weeks   Current Outpatient Medications:    albuterol (PROVENTIL HFA;VENTOLIN HFA) 108 (90 BASE) MCG/ACT inhaler, Inhale 2 puffs into the lungs every 6 (six) hours as needed for wheezing or shortness of breath., Disp: , Rfl:    atorvastatin (LIPITOR) 40 MG tablet, Take 40 mg by mouth daily., Disp: , Rfl:    Cholecalciferol 125 MCG (5000 UT) TABS, Take 5,000 Units by mouth daily., Disp: , Rfl:  escitalopram (LEXAPRO) 10 MG tablet, Take 10 mg by mouth daily., Disp: , Rfl: 3   famotidine (PEPCID) 10 MG tablet, Take 10 mg by mouth at bedtime., Disp: , Rfl:    Fluticasone-Umeclidin-Vilant (TRELEGY ELLIPTA) 200-62.5-25 MCG/INH AEPB, , Disp: , Rfl:    ibuprofen (ADVIL) 200 MG tablet, Take 400 mg by mouth every 6 (six) hours as needed for headache or moderate pain., Disp: , Rfl:    levothyroxine (SYNTHROID) 75 MCG tablet, Take 75 mcg by mouth See admin instructions. Alternate every other day with 50 mcg, Disp: , Rfl:    levothyroxine (SYNTHROID, LEVOTHROID) 50 MCG tablet, Take 1 tablet by mouth every other day. Alternate every other day  with 75 mcg, Disp: , Rfl:    Multiple Vitamins-Minerals (WOMENS 50+ MULTI VITAMIN/MIN PO), , Disp: , Rfl:    olmesartan (BENICAR) 20 MG tablet, Take 20 mg by mouth daily., Disp: , Rfl:    atorvastatin (LIPITOR) 20 MG tablet, Take 40 mg by mouth at bedtime. (Patient not taking: Reported on 11/15/2021), Disp: , Rfl:    fluticasone (FLONASE) 50 MCG/ACT nasal spray, Place 2 sprays into both nostrils daily. (Patient not taking: Reported on 11/15/2021), Disp: , Rfl:    furosemide (LASIX) 20 MG tablet, TAKE ONE TABLET AS NEEDED FOR WEIGHT GAIN OF 2 POUNDS IN  24 HOURS OR 5 POUNDS IN 7 DAYS (Patient not taking: Reported on 11/15/2021), Disp: 30 tablet, Rfl: 1 No current facility-administered medications for this visit.  Facility-Administered Medications Ordered in Other Visits:    0.9 %  sodium chloride infusion, , Intravenous, Continuous, Causey, Charlestine Massed, NP, Stopped at 12/24/18 [facility] 11/15/2021 11:51 AM

## 2021-11-15 NOTE — Addendum Note (Signed)
Addended bySkeet Simmer, Lucy Woolever M on: 11/15/2021 12:12 PM   Modules accepted: Orders

## 2021-11-15 NOTE — Patient Instructions (Signed)
Thank you for visiting Dr. Valeta Harms at [facility]. Today we recommend the following:  Breztri with space, samples and new prescription  Start Singulair  Start allegra '180mg'$  at night  Continue flonase   Blood work today: regional allergy panel and cbc w/ differential   Return in about 4 weeks (around 12/13/2021) for with APP.    Please do your part to reduce the spread of COVID-19.

## 2021-11-15 NOTE — Addendum Note (Signed)
Addended by: Valerie Salts on: 11/15/2021 01:26 PM   Modules accepted: Orders

## 2021-11-16 LAB — RESPIRATORY ALLERGY PROFILE REGION II ~~LOC~~

## 2021-11-16 LAB — INTERPRETATION:

## 2021-11-20 ENCOUNTER — Telehealth: Payer: Self-pay

## 2021-11-20 NOTE — Patient Outreach (Signed)
  Care Coordination   Initial Visit Note   11/20/2021 Name: Dorothy Frederick MRN: [id number] DOB: April 03, 1955  Dorothy Frederick is a 66 y.o. year old female who sees Dorothy Bien, MD for primary care. I spoke with  Dorothy Frederick by phone today.  What matters to the patients health and wellness today?  Patient reported that "things are getting better" as far as her health is concerned. She is able to manage her medical care. [facility] services reviewed and discussed. Patient denies any needs or concerns at this time.     Goals Addressed             This Visit's Progress    COMPLETED: Care Coordination-no follow up required       Care Coordination Interventions: Advised patient to schedule Annual wellness Visit(physical noted on 09/27/21), discussed immunizations-pt reports she got flu vaccine for this season already at PCP office(around Aug./Sept) Provided education to patient re: [facility] services Assessed social determinant of health barriers          SDOH assessments and interventions completed:  Yes  SDOH Interventions Today    Flowsheet Row Most Recent Value  SDOH Interventions   Food Insecurity Interventions Intervention Not Indicated  Transportation Interventions Intervention Not Indicated        Care Coordination Interventions Activated:  Yes  Care Coordination Interventions:  Yes, provided   Follow up plan: No further intervention required.   Encounter Outcome:  Pt. Visit Completed   Enzo Montgomery, RN,BSN,CCM [place] [facility] Telephonic Care Management Coordinator Direct Phone: [phone] Toll Free: [phone] Fax: [phone]

## 2021-11-20 NOTE — Progress Notes (Signed)
Dorothy Frederick, please let patient know that I have reviewed her recent lab work.  She does have a elevated IgE.  Her allergy panel was negative.  She does have elevated eosinophils.  Continue current regimen and of inhalers.  She has follow-up with Roxan Diesel in a few weeks.  May be able to consider treatment with tezepelumab if no improvement.  Thanks,  BLI  Garner Nash, DO  Pulmonary Critical Care 11/20/2021 3:51 PM

## 2021-12-03 ENCOUNTER — Other Ambulatory Visit: Payer: Self-pay

## 2021-12-03 DIAGNOSIS — Z87891 Personal history of nicotine dependence: Secondary | ICD-10-CM

## 2021-12-03 DIAGNOSIS — Z122 Encounter for screening for malignant neoplasm of respiratory organs: Secondary | ICD-10-CM

## 2021-12-12 NOTE — Progress Notes (Signed)
$'@Patient's$  ID: Dorothy Frederick, female    DOB: Jun 18, 1955, 66 y.o.   MRN: [id number]  Chief Complaint  Patient presents with   Follow-up    Pt f/u she is feeling better, she is still having difficulty breathing and walking up steps. She reports having some coughing as well but not as severe as before.     Referring provider: [name], MD  HPI: 66 year old female, former smoker followed for COPD/asthma overlap. She is a patient of Dr. Juline Patch and last seen in office on 11/15/2021. Past medical history significant for HTN. She has a history of left breast cancer and s/p mastectomy 2000.   TEST/EVENTS:   11/15/2021: OV with Dr. Valeta Harms. COPD with DLCO 39%. She has been on Trelegy for maintenance. She's hd recurrent exacerbations requiring abx and steroids. Last was about a month ago. Still having increased SOB and nocturnal symptoms. Does have allergies and she has had some astham in the past. Prior CBC with diff showed eos 200. Plan to recheck plus allergen panel. Started on singulair, allegra. Switch from Trelegy to [employer]. If evidence of type II inflammation, consider biologic therapy.   12/14/2021: Today - follow up Patient presents today for follow up. She had allergy testing when she was here last - RAST was negative but she did have an elevated IgE and eosinophils (100). Today, she tells me that she is feeling better with the [facility]. Feels like it works better for her when compared to the Trelegy. She also feels like the allegra and singulair have helped her allergy symptoms. She's still struggling with shortness of breath with activity, especially walking any short of distance, and a daily cough. She also has some voice hoarseness, which she attributes to postnasal drainage. She denies any fevers, chills, headaches, sore throat, chest congestion. Does have some occasional wheezing. 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 Her symptoms tend to worsen after this.    Allergies  Allergen Reactions   Benadryl [Diphenhydramine Hcl (Sleep)] Palpitations    Increased heartrate   Sulfa Antibiotics Rash    Immunization History  Administered Date(s) Administered   Influenza,inj,Quad PF,6+ Mos 11/05/2018   Influenza-Unspecified 10/21/2017, 10/11/2021   PFIZER(Purple Top)SARS-COV-2 Vaccination 11/03/2019, 05/15/2020    Past Medical History:  Diagnosis Date   Angina at rest    Anxiety    Cancer ([place]) 2000   left breast cancer-partial mastectomy   COPD (chronic obstructive pulmonary disease) ([place])    Depression    Family history of breast cancer    Family history of esophageal cancer    Family history of lung cancer    Family history of prostate cancer    GERD (gastroesophageal reflux disease)    Hyperlipidemia    Hypertension    Hypothyroidism    Recurrent breast cancer, left ([place])     Tobacco History: Social History   Tobacco Use  Smoking Status Former   Packs/day: 1.50   Years: 38.00   Total pack years: 57.00   Types: Cigarettes   Start date: 93   Quit date: 01/21/2013   Years since quitting: 8.9  Smokeless Tobacco Never   Counseling given: Not Answered   Outpatient Medications Prior to Visit  Medication Sig Dispense Refill   albuterol (PROVENTIL HFA;VENTOLIN HFA) 108 (90 BASE) MCG/ACT inhaler Inhale 2 puffs into the lungs every 6 (six) hours as needed for wheezing or shortness of breath.     atorvastatin (LIPITOR) 40 MG tablet Take 40 mg by mouth  daily.     Budeson-Glycopyrrol-Formoterol (BREZTRI AEROSPHERE) 160-9-4.8 MCG/ACT AERO Inhale 2 puffs into the lungs 2 (two) times daily. 5.9 g 0   Cholecalciferol 125 MCG (5000 UT) TABS Take 5,000 Units by mouth daily.     escitalopram (LEXAPRO) 10 MG tablet Take 10 mg by mouth daily.  3   famotidine (PEPCID) 10 MG tablet Take 10 mg by mouth at bedtime.     fexofenadine (ALLEGRA ALLERGY) 180 MG tablet Take 1 tablet (180 mg total) by mouth daily. 30 tablet 5   fluticasone (FLONASE)  50 MCG/ACT nasal spray Place 2 sprays into both nostrils daily. 16 g 5   ibuprofen (ADVIL) 200 MG tablet Take 400 mg by mouth every 6 (six) hours as needed for headache or moderate pain.     levothyroxine (SYNTHROID) 75 MCG tablet Take 75 mcg by mouth See admin instructions. Alternate every other day with 50 mcg     levothyroxine (SYNTHROID, LEVOTHROID) 50 MCG tablet Take 1 tablet by mouth every other day. Alternate every other day  with 75 mcg     montelukast (SINGULAIR) 10 MG tablet Take 1 tablet (10 mg total) by mouth at bedtime. 30 tablet 5   Multiple Vitamins-Minerals (WOMENS 50+ MULTI VITAMIN/MIN PO)      olmesartan (BENICAR) 20 MG tablet Take 20 mg by mouth daily.     Fluticasone-Umeclidin-Vilant (TRELEGY ELLIPTA) 200-62.5-25 MCG/INH AEPB      atorvastatin (LIPITOR) 20 MG tablet Take 40 mg by mouth at bedtime. (Patient not taking: Reported on 11/15/2021)     Budeson-Glycopyrrol-Formoterol (BREZTRI AEROSPHERE) 160-9-4.8 MCG/ACT AERO Inhale 2 puffs into the lungs 2 (two) times daily. Use spacer with Breztri 10.7 g 5   furosemide (LASIX) 20 MG tablet TAKE ONE TABLET AS NEEDED FOR WEIGHT GAIN OF 2 POUNDS IN 24 HOURS OR 5 POUNDS IN 7 DAYS (Patient not taking: Reported on 11/15/2021) 30 tablet 1   Facility-Administered Medications Prior to Visit  Medication Dose Route Frequency Provider Last Rate Last Admin   0.9 %  sodium chloride infusion   Intravenous Continuous Gardenia Phlegm, NP   Stopped at 12/24/18 1548     Review of Systems:   Constitutional: No weight loss or gain, night sweats, fevers, chills, fatigue, or lassitude. HEENT: No headaches, difficulty swallowing, tooth/dental problems, or sore throat. No sneezing, itching, ear ache. +nasal congestion, post nasal drip CV:  No chest pain, orthopnea, PND, swelling in lower extremities, anasarca, dizziness, palpitations, syncope Resp: +shortness of breath with exertion; daily cough; nocturnal wheeze. No hemoptysis. No chest wall  deformity GI:  No heartburn, indigestion, abdominal pain, nausea, vomiting, diarrhea, change in bowel habits, loss of appetite, bloody stools.  Skin: No rash, lesions, ulcerations MSK:  No joint pain or swelling.   Neuro: No dizziness or lightheadedness.  Psych: No depression or anxiety. Mood stable.     Physical Exam:  BP 102/64   Pulse (!) 110   Ht '5\' 4"'$  (1.626 m)   Wt 178 lb (80.7 kg)   SpO2 95%   BMI 30.55 kg/m   GEN: Pleasant, interactive, well-appearing; obese; in no acute distress. HEENT:  Normocephalic and atraumatic. PERRLA. Sclera white. Nasal turbinates pink, moist and patent bilaterally. No rhinorrhea present. Oropharynx erythematous and moist, without exudate or edema. No lesions, ulcerations NECK:  Supple w/ fair ROM. No JVD present. Normal carotid impulses w/o bruits. Thyroid symmetrical with no goiter or nodules palpated. No lymphadenopathy.   CV: RRR, no m/r/g, no peripheral edema. Pulses intact, +2 bilaterally.  No cyanosis, pallor or clubbing. PULMONARY:  Unlabored, regular breathing. Diminished bilaterally A&P w/o wheezes/rales/rhonchi. No accessory muscle use.  GI: BS present and normoactive. Soft, non-tender to palpation. No organomegaly or masses detected.  MSK: No erythema, warmth or tenderness. Cap refil <2 sec all extrem. No deformities or joint swelling noted.  Neuro: A/Ox3. No focal deficits noted.   Skin: Warm, no lesions or rashe Psych: Normal affect and behavior. Judgement and thought content appropriate.     Lab Results:  CBC    Component Value Date/Time   WBC 5.6 11/15/2021 1328   RBC 4.33 11/15/2021 1328   HGB 12.4 11/15/2021 1328   HGB 12.3 07/22/2018 0835   HGB 14.1 09/08/2006 1500   HCT 38.2 11/15/2021 1328   HCT 39.7 09/08/2006 1500   PLT 268.0 11/15/2021 1328   PLT 266 07/22/2018 0835   PLT 240 09/08/2006 1500   MCV 88.3 11/15/2021 1328   MCV 87.9 09/08/2006 1500   MCH 28.4 03/30/2021 0822   MCHC 32.4 11/15/2021 1328   RDW 15.4  11/15/2021 1328   RDW 12.0 09/08/2006 1500   LYMPHSABS 1.1 11/15/2021 1328   LYMPHSABS 1.8 09/08/2006 1500   MONOABS 0.6 11/15/2021 1328   MONOABS 0.5 09/08/2006 1500   EOSABS 0.1 11/15/2021 1328   EOSABS 0.1 09/08/2006 1500   BASOSABS 0.0 11/15/2021 1328   BASOSABS 0.1 09/08/2006 1500    BMET    Component Value Date/Time   NA 141 03/30/2021 0822   K 3.8 03/30/2021 0822   CL 108 03/30/2021 0822   CO2 26 03/30/2021 0822   GLUCOSE 127 (H) 03/30/2021 0822   BUN 17 03/30/2021 0822   CREATININE 1.09 (H) 03/30/2021 0822   CREATININE 1.11 (H) 07/22/2018 0835   CREATININE 0.97 10/18/2013 1527   CALCIUM 9.6 03/30/2021 0822   GFRNONAA 56 (L) 03/30/2021 0822   GFRNONAA 53 (L) 07/22/2018 0835   GFRAA >60 10/18/2019 1100   GFRAA >60 07/22/2018 0835    BNP No results found for: "BNP"   Imaging:  No results found.       Latest Ref Rng & Units 12/11/2020    8:58 AM  PFT Results  FVC-Pre L 2.65   FVC-Predicted Pre % 85   FVC-Post L 2.69   FVC-Predicted Post % 86   Pre FEV1/FVC % % 59   Post FEV1/FCV % % 60   FEV1-Pre L 1.55   FEV1-Predicted Pre % 65   FEV1-Post L 1.62   DLCO uncorrected ml/min/mmHg 7.62   DLCO UNC% % 39   DLCO corrected ml/min/mmHg 7.62   DLCO COR %Predicted % 39   DLVA Predicted % 46   TLC L 5.01   TLC % Predicted % 100   RV % Predicted % 104     No results found for: "NITRICOXIDE"      Assessment & Plan:   Asthma-COPD overlap syndrome Clinically improved but remains poorly controlled with dyspnea and daily cough. She appears to have an allergic component to her symptoms. We will start her on biologic therapy with Tezspire. Process for patient assistance started today.   Patient Instructions  Continue Breztri 2 puffs Twice daily. Brush tongue and rinse mouth afterwards Continue Albuterol inhaler 2 puffs or 3 mL neb every 6 hours as needed for shortness of breath or wheezing. Notify if symptoms persist despite rescue inhaler/neb use.   Continue flonase nasal spray 2 sprays each nostril daily  Continue montelukast 1 tab At bedtime  Continue allegra 1 tab daily  Start process for Tezspire   Asthma action plan: START nebs up to four times a day for worsening shortness of breath, wheezing and cough. If you symptoms do not improve in 24-48 hours, contact us for steroid course. If your symptoms rapidly worsen, you have trouble talking or extreme difficulty breathing, or you're not getting relief from your nebulizer/rescue, go to the emergency department.   Follow up in 8 weeks with Dr. Valeta Harms or Joellen Jersey Sophonie Goforth,NP to see how new medication is going. If symptoms do not improve or worsen, please contact office for sooner follow up or seek emergency care.    Allergic rhinitis RAST was negative. Mildly elevate IgE and eosinophils. She will continue on allegra, singulair and flonase. See above plan.  Upper airway cough syndrome Suspect some of her cough is related to upper airway irritation from postnasal drainage. Advised on measures to limit further upper airway irritation.   I spent 35 minutes of dedicated to the care of this patient on the date of this encounter to include pre-visit review of records, face-to-face time with the patient discussing conditions above, post visit ordering of testing, clinical documentation with the electronic health record, making appropriate referrals as documented, and communicating necessary findings to members of the patients care team.  Clayton Bibles, NP 12/14/2021  Pt aware and understands NP's role.

## 2021-12-14 ENCOUNTER — Encounter: Payer: Self-pay | Admitting: Nurse Practitioner

## 2021-12-14 ENCOUNTER — Ambulatory Visit (INDEPENDENT_AMBULATORY_CARE_PROVIDER_SITE_OTHER): Payer: BC Managed Care – PPO | Admitting: Nurse Practitioner

## 2021-12-14 VITALS — BP 102/64 | HR 110 | Ht 64.0 in | Wt 178.0 lb

## 2021-12-14 DIAGNOSIS — J3089 Other allergic rhinitis: Secondary | ICD-10-CM | POA: Diagnosis not present

## 2021-12-14 DIAGNOSIS — J4489 Other specified chronic obstructive pulmonary disease: Secondary | ICD-10-CM

## 2021-12-14 DIAGNOSIS — R058 Other specified cough: Secondary | ICD-10-CM | POA: Diagnosis not present

## 2021-12-14 DIAGNOSIS — J309 Allergic rhinitis, unspecified: Secondary | ICD-10-CM | POA: Insufficient documentation

## 2021-12-14 LAB — POCT EXHALED NITRIC OXIDE: FeNO level (ppb): 10

## 2021-12-14 NOTE — Assessment & Plan Note (Signed)
Suspect some of her cough is related to upper airway irritation from postnasal drainage. Advised on measures to limit further upper airway irritation.

## 2021-12-14 NOTE — Patient Instructions (Addendum)
Continue Breztri 2 puffs Twice daily. Brush tongue and rinse mouth afterwards Continue Albuterol inhaler 2 puffs or 3 mL neb every 6 hours as needed for shortness of breath or wheezing. Notify if symptoms persist despite rescue inhaler/neb use.  Continue flonase nasal spray 2 sprays each nostril daily  Continue montelukast 1 tab At bedtime  Continue allegra 1 tab daily   Start process for Tezspire   Asthma action plan: START nebs up to four times a day for worsening shortness of breath, wheezing and cough. If you symptoms do not improve in 24-48 hours, contact us for steroid course. If your symptoms rapidly worsen, you have trouble talking or extreme difficulty breathing, or you're not getting relief from your nebulizer/rescue, go to the emergency department.   Avoid throat clearing.  Use sugar free hard candies or non-menthol cough drops during this time to soothe your throat.  Warm tea with honey and lemon.   Follow up in 8 weeks with Dorothy Frederick or Dorothy Jersey Lou Loewe,Dorothy Frederick to see how new medication is going. If symptoms do not improve or worsen, please contact office for sooner follow up or seek emergency care.

## 2021-12-14 NOTE — Assessment & Plan Note (Signed)
Clinically improved but remains poorly controlled with dyspnea and daily cough. She appears to have an allergic component to her symptoms. We will start her on biologic therapy with Tezspire. Process for patient assistance started today.   Patient Instructions  Continue Breztri 2 puffs Twice daily. Brush tongue and rinse mouth afterwards Continue Albuterol inhaler 2 puffs or 3 mL neb every 6 hours as needed for shortness of breath or wheezing. Notify if symptoms persist despite rescue inhaler/neb use.  Continue flonase nasal spray 2 sprays each nostril daily  Continue montelukast 1 tab At bedtime  Continue allegra 1 tab daily   Start process for Tezspire   Asthma action plan: START nebs up to four times a day for worsening shortness of breath, wheezing and cough. If you symptoms do not improve in 24-48 hours, contact us for steroid course. If your symptoms rapidly worsen, you have trouble talking or extreme difficulty breathing, or you're not getting relief from your nebulizer/rescue, go to the emergency department.   Follow up in 8 weeks with Dr. Valeta Harms or Joellen Jersey Londan Coplen,NP to see how new medication is going. If symptoms do not improve or worsen, please contact office for sooner follow up or seek emergency care.

## 2021-12-14 NOTE — Assessment & Plan Note (Signed)
RAST was negative. Mildly elevate IgE and eosinophils. She will continue on allegra, singulair and flonase. See above plan.

## 2021-12-27 ENCOUNTER — Ambulatory Visit (INDEPENDENT_AMBULATORY_CARE_PROVIDER_SITE_OTHER): Payer: BC Managed Care – PPO | Admitting: Primary Care

## 2021-12-27 DIAGNOSIS — Z87891 Personal history of nicotine dependence: Secondary | ICD-10-CM | POA: Diagnosis not present

## 2021-12-27 NOTE — Patient Instructions (Signed)
Thank you for participating in the [facility]. It was our pleasure to meet you today. We will call you with the results of your scan within the next few days. Your scan will be assigned a Lung RADS category score by the physicians reading the scans.  This Lung RADS score determines follow up scanning.  See below for description of categories, and follow up screening recommendations. We will be in touch to schedule your follow up screening annually or based on recommendations of our providers. We will fax a copy of your scan results to your Primary Care Physician, or the physician who referred you to the program, to ensure they have the results. Please call the office if you have any questions or concerns regarding your scanning experience or results.  Our office number is [phone]. Please speak with Denise Phelps, RN. , or  Denise Buckner RN, They are  our Lung Cancer Screening RN.'s If They are unavailable when you call, Please leave a message on the voice mail. We will return your call at our earliest convenience.This voice mail is monitored several times a day.  Remember, if your scan is normal, we will scan you annually as long as you continue to meet the criteria for the program. (Age 55-77, Current smoker or smoker who has quit within the last 15 years). If you are a smoker, remember, quitting is the single most powerful action that you can take to decrease your risk of lung cancer and other pulmonary, breathing related problems. We know quitting is hard, and we are here to help.  Please let us know if there is anything we can do to help you meet your goal of quitting. If you are a former smoker, congratulations. We are proud of you! Remain smoke free! Remember you can refer friends or family members through the number above.  We will screen them to make sure they meet criteria for the program. Thank you for helping us take better care of you by  participating in Lung Screening.  You can receive free nicotine replacement therapy ( patches, gum or mints) by calling 1-800-QUIT NOW. Please call so we can get you on the path to becoming  a non-smoker. I know it is hard, but you can do this!  Lung RADS Categories:  Lung RADS 1: no nodules or definitely non-concerning nodules.  Recommendation is for a repeat annual scan in 12 months.  Lung RADS 2:  nodules that are non-concerning in appearance and behavior with a very low likelihood of becoming an active cancer. Recommendation is for a repeat annual scan in 12 months.  Lung RADS 3: nodules that are probably non-concerning , includes nodules with a low likelihood of becoming an active cancer.  Recommendation is for a 6-month repeat screening scan. Often noted after an upper respiratory illness. We will be in touch to make sure you have no questions, and to schedule your 6-month scan.  Lung RADS 4 A: nodules with concerning findings, recommendation is most often for a follow up scan in 3 months or additional testing based on our provider's assessment of the scan. We will be in touch to make sure you have no questions and to schedule the recommended 3 month follow up scan.  Lung RADS 4 B:  indicates findings that are concerning. We will be in touch with you to schedule additional diagnostic testing based on our provider's  assessment of the scan.  Other options for assistance in smoking cessation (   As covered by your insurance benefits)  Hypnosis for smoking cessation  [employer] [phone]  Acupuncture for smoking cessation  [facility] [phone]   

## 2021-12-27 NOTE — Progress Notes (Signed)
Virtual Visit via Telephone Note  I connected with Sibbie Flammia on 12/27/21 at  9:00 AM EST by telephone and verified that I am speaking with the correct person using two identifiers.  Location: Patient: Home Provider: Office    I discussed the limitations, risks, security and privacy concerns of performing an evaluation and management service by telephone and the availability of in person appointments. I also discussed with the patient that there may be a patient responsible charge related to this service. The patient expressed understanding and agreed to proceed.  Shared Decision Making Visit Lung Cancer Screening Program [phone])   Eligibility: Age 66 y.o. Pack Years Smoking History Calculation 53 (# packs/per year x # years smoked) Recent History of coughing up blood  no Unexplained weight loss? no ( >Than 15 pounds within the last 6 months ) Prior History Lung / other cancer no (Diagnosis within the last 5 years already requiring surveillance chest CT Scans). Smoking Status Former Smoker Former Smokers: Years since quit: 8 years  Quit Date: 2015  Visit Components: Discussion included one or more decision making aids. yes Discussion included risk/benefits of screening. yes Discussion included potential follow up diagnostic testing for abnormal scans. yes Discussion included meaning and risk of over diagnosis. yes Discussion included meaning and risk of False Positives. yes Discussion included meaning of total radiation exposure. yes  Counseling Included: Importance of adherence to annual lung cancer LDCT screening. yes Impact of comorbidities on ability to participate in the program. yes Ability and willingness to under diagnostic treatment. yes  Smoking Cessation Counseling: Current Smokers:  Discussed importance of smoking cessation. yes Information about tobacco cessation classes and interventions provided to patient. yes Patient provided with "ticket" for LDCT  Scan. Na Symptomatic Patient. no  Counseling(Intermediate counseling: > three minutes) 99406 Diagnosis Code: Tobacco Use Z72.0 Asymptomatic Patient yes  Counseling (Intermediate counseling: > three minutes counseling) [id number] Former Smokers:  Discussed the importance of maintaining cigarette abstinence. yes Diagnosis Code: Personal History of Nicotine Dependence. Y63.785 Information about tobacco cessation classes and interventions provided to patient. Yes Patient provided with "ticket" for LDCT Scan. NA Written Order for Lung Cancer Screening with LDCT placed in Epic. Yes (CT Chest Lung Cancer Screening Low Dose W/O CM) [id number] Z12.2-Screening of respiratory organs Z87.891-Personal history of nicotine dependence  I have spent 25 minutes of face to face/ virtual visit time with Ms Mihok discussing the risks and benefits of lung cancer screening. We viewed / discussed a power point together that explained in detail the above noted topics. We paused at intervals to allow for questions to be asked and answered to ensure understanding.We discussed that the single most powerful action that she can take to decrease her risk of developing lung cancer is to quit smoking. We discussed whether or not she is ready to commit to setting a quit date. We discussed options for tools to aid in quitting smoking including nicotine replacement therapy, non-nicotine medications, support groups, Quit Smart classes, and behavior modification. We discussed that often times setting smaller, more achievable goals, such as eliminating 1 cigarette a day for a week and then 2 cigarettes a day for a week can be helpful in slowly decreasing the number of cigarettes smoked. This allows for a sense of accomplishment as well as providing a clinical benefit. I provided her with smoking cessation  information  with contact information for community resources, classes, free nicotine replacement therapy, and access to mobile apps, text  messaging, and on-line smoking cessation help.  I have also provided her the office contact information in the event she needs to contact me, or the screening staff. We discussed the time and location of the scan, and that either Doroteo Glassman RN, Joella Prince, RN  or I will call / send a letter with the results within 24-72 hours of receiving them. The patient verbalized understanding of all of  the above and had no further questions upon leaving the office. They have my contact information in the event they have any further questions.  I spent 3-5 minutes counseling on smoking cessation and the health risks of continued tobacco abuse.  I explained to the patient that there has been a high incidence of coronary artery disease noted on these exams. I explained that this is a non-gated exam therefore degree or severity cannot be determined. This patient is on statin therapy. I have asked the patient to follow-up with their PCP regarding any incidental finding of coronary artery disease and management with diet or medication as their PCP  feels is clinically indicated. The patient verbalized understanding of the above and had no further questions upon completion of the visit.      Martyn Ehrich, NP

## 2022-01-04 ENCOUNTER — Telehealth: Payer: Self-pay

## 2022-01-04 ENCOUNTER — Ambulatory Visit
Admission: RE | Admit: 2022-01-04 | Discharge: 2022-01-04 | Disposition: A | Discharge status: Home / Self Care | Payer: BC Managed Care – PPO | Source: Ambulatory Visit | Attending: Family Medicine | Admitting: Family Medicine

## 2022-01-04 DIAGNOSIS — Z122 Encounter for screening for malignant neoplasm of respiratory organs: Secondary | ICD-10-CM

## 2022-01-04 DIAGNOSIS — J4489 Other specified chronic obstructive pulmonary disease: Secondary | ICD-10-CM

## 2022-01-04 DIAGNOSIS — Z87891 Personal history of nicotine dependence: Secondary | ICD-10-CM | POA: Diagnosis not present

## 2022-01-04 NOTE — Telephone Encounter (Signed)
Received New start paperwork for TEZSPIRE, however it is ONLY the "Tezspire Together" form for the enrollment into copay card program--the form for PAP was NOT COMPLETED. Pt appears to currently have both commercial insurance AND Part D coverage, so we will update as we work through the benefits process.

## 2022-01-07 ENCOUNTER — Other Ambulatory Visit: Payer: Self-pay | Admitting: Acute Care

## 2022-01-07 ENCOUNTER — Other Ambulatory Visit: Payer: Self-pay | Admitting: Family Medicine

## 2022-01-07 DIAGNOSIS — I2510 Atherosclerotic heart disease of native coronary artery without angina pectoris: Secondary | ICD-10-CM

## 2022-01-07 DIAGNOSIS — Z87891 Personal history of nicotine dependence: Secondary | ICD-10-CM

## 2022-01-07 DIAGNOSIS — Z122 Encounter for screening for malignant neoplasm of respiratory organs: Secondary | ICD-10-CM

## 2022-01-08 ENCOUNTER — Encounter: Payer: Self-pay | Admitting: Pulmonary Disease

## 2022-01-09 ENCOUNTER — Other Ambulatory Visit (HOSPITAL_COMMUNITY): Payer: Self-pay

## 2022-01-09 NOTE — Telephone Encounter (Signed)
Submitted a Prior Authorization request to [employer] for TEZSPIRE via CoverMyMeds. Authorization has been APPROVED from 12/10/2021 to 07/08/2022.      Patient must fill through [facility]: [phone]    CMM KEY: [facility] Authorization#: [id number]   Will await pt to provide new Part D info before proceeding.

## 2022-01-09 NOTE — Telephone Encounter (Signed)
Pt reached out to clinic for update. I returned call and explained to the pt the steps of the BIV process, and obtained further information regarding current insurance status. She states that she DOES have prescription coverage through both her commercial plan and Medicare Part D, however her Medicare plan will be changing at the start of the new year and she has not obtained that card information yet.  I explained to her that I will work on obtaining a PA through her commercial plan, and then through her Part D plan once she has sent in her new Medicare plan information. After that I'll attempt to run the rx through both of her insurances as a COB, if that is unsuccessful then we will (depending on which insurance acts as the primary) either enroll her for a copay card to be used alongside her commercial plan OR enroll her into the PAP program to receive free drug. Pt verbalized understanding. I provided my direct office number to her so that she could call me directly if need be.

## 2022-01-09 NOTE — Telephone Encounter (Signed)
Please see mychart message and media attachments by pt. Thank you!

## 2022-01-15 NOTE — Telephone Encounter (Signed)
[facility] insurance information that patient sent via [place] (active from 01/21/2022)  ID: [id number] BIN: [id number]  PCN: [id number] Group: [id number]  Patient advised that we will be able to run PA after 01/21/2022 (once plan is actually active)  Knox Saliva, PharmD, MPH, BCPS, CPP Clinical Pharmacist (Rheumatology and Pulmonology)

## 2022-01-25 NOTE — Telephone Encounter (Signed)
Submitted a Prior Authorization request to [facility] for TEZSPIRE via CoverMyMeds. Will update once we receive a response.  Key: [id number]

## 2022-01-28 ENCOUNTER — Other Ambulatory Visit (HOSPITAL_COMMUNITY): Payer: Self-pay

## 2022-01-28 NOTE — Telephone Encounter (Signed)
Received notification from [facility] regarding a prior authorization for TEZSPIRE. Authorization has been APPROVED from 01/25/2022 until further notice. Approval letter sent to scan center.  Per test claim, medication needs to be submitted to primary payer. There is an additional note stating that "[place]". I am unsure if this means that Medicare cannot be billed as a secondary COB due to filling restrictions of primary, however pt may be eligible for copay card if not (due to primary insurance being commercial). Unfortunately we won't know until Rx is sent in to required pharmacy.  Patient must fill through [place]: [phone] per primary insurance.  Authorization # [id number]

## 2022-02-05 MED ORDER — DUPIXENT 300 MG/2ML ~~LOC~~ SOAJ: SUBCUTANEOUS | 0 refills | Status: DC

## 2022-02-05 NOTE — Telephone Encounter (Signed)
Rx for Tezspire sent to [facility] today.   Returned call to patient to advise that rx for Dorothy Frederick has been sent to [facility]. Provided her with phone number. She will call pharmacy on Friday or Monday to set up shipment. Provided herwith my direct phone number to schedule new start appointment  Knox Saliva, PharmD, MPH, BCPS, CPP Clinical Pharmacist (Rheumatology and Pulmonology)

## 2022-02-08 ENCOUNTER — Ambulatory Visit: Payer: BC Managed Care – PPO | Admitting: Nurse Practitioner

## 2022-02-11 ENCOUNTER — Ambulatory Visit
Admission: RE | Admit: 2022-02-11 | Discharge: 2022-02-11 | Disposition: A | Discharge status: Home / Self Care | Payer: No Typology Code available for payment source | Source: Ambulatory Visit | Attending: Family Medicine | Admitting: Family Medicine

## 2022-02-11 DIAGNOSIS — I2510 Atherosclerotic heart disease of native coronary artery without angina pectoris: Secondary | ICD-10-CM

## 2022-02-11 DIAGNOSIS — E559 Vitamin D deficiency, unspecified: Secondary | ICD-10-CM | POA: Diagnosis not present

## 2022-02-11 DIAGNOSIS — I700 Atherosclerosis of aorta: Secondary | ICD-10-CM | POA: Diagnosis not present

## 2022-02-13 DIAGNOSIS — I10 Essential (primary) hypertension: Secondary | ICD-10-CM | POA: Diagnosis not present

## 2022-02-13 DIAGNOSIS — E559 Vitamin D deficiency, unspecified: Secondary | ICD-10-CM | POA: Diagnosis not present

## 2022-02-13 DIAGNOSIS — J441 Chronic obstructive pulmonary disease with (acute) exacerbation: Secondary | ICD-10-CM | POA: Diagnosis not present

## 2022-02-13 DIAGNOSIS — I2510 Atherosclerotic heart disease of native coronary artery without angina pectoris: Secondary | ICD-10-CM | POA: Diagnosis not present

## 2022-02-18 MED ORDER — TEZSPIRE 210 MG/1.91ML ~~LOC~~ SOAJ: 210.0000 mg | SUBCUTANEOUS | 5 refills | Status: DC

## 2022-02-18 NOTE — Telephone Encounter (Signed)
Rx for Cheron Every resent to [facility]. Will f/u with pharmacy later this week.  Knox Saliva, PharmD, MPH, BCPS, CPP Clinical Pharmacist (Rheumatology and Pulmonology)

## 2022-03-07 ENCOUNTER — Telehealth: Payer: Self-pay | Admitting: Nurse Practitioner

## 2022-03-07 NOTE — Telephone Encounter (Signed)
Patient states she received Tezspire from [facility] despite them continuing to have issues with billing her [facility] plan.  Attempted to enroll patient into copay card via Tezspire copay card portal, but did not process successfully likely because patient is >67 y/o.  Confirmation # [id number]  Patient will likely have to contact portal for assistance if Medicare does not pick up copay. Copay card phone number: [phone]  Patient is scheduled for Tezspire new start on 03/11/2022. Patient aware that we will use sample  Knox Saliva, PharmD, MPH, BCPS, CPP Clinical Pharmacist (Rheumatology and Pulmonology)

## 2022-03-07 NOTE — Telephone Encounter (Signed)
Patient is scheduled for Tezspire new start on 03/11/2022. Patient aware that we will use sample  Knox Saliva, PharmD, MPH, BCPS, CPP Clinical Pharmacist (Rheumatology and Pulmonology)

## 2022-03-11 ENCOUNTER — Ambulatory Visit: Payer: BC Managed Care – PPO | Admitting: Pharmacist

## 2022-03-11 DIAGNOSIS — J4489 Other specified chronic obstructive pulmonary disease: Secondary | ICD-10-CM

## 2022-03-11 DIAGNOSIS — Z7189 Other specified counseling: Secondary | ICD-10-CM

## 2022-03-11 MED ORDER — BREZTRI AEROSPHERE 160-9-4.8 MCG/ACT IN AERO: 2.0000 | INHALATION_SPRAY | Freq: Two times a day (BID) | RESPIRATORY_TRACT | 5 refills | Status: DC

## 2022-03-11 NOTE — Progress Notes (Signed)
HPI Patient presents today to [facility] to see pharmacy team for [facility] new start.  Past medical history includes severe persistent asthma-COPD overlap, allergic rhinitis, HTN, CAD, history of malignant left breast cancer, former smoker  She has been on a triple inhaler for many years now.  Respiratory Medications Current regimen: Breztri 160-9-4.30mg (2 puffs twice daily), montelukast '10mg'$  nightly, Flonase daily, fexofenadine daily Tried in past: Trelegy for many years Patient reports no known adherence challenges  OBJECTIVE Allergies  Allergen Reactions   Benadryl [Diphenhydramine Hcl (Sleep)] Palpitations    Increased heartrate   Sulfa Antibiotics Rash    Outpatient Encounter Medications as of 03/11/2022  Medication Sig   albuterol (PROVENTIL HFA;VENTOLIN HFA) 108 (90 BASE) MCG/ACT inhaler Inhale 2 puffs into the lungs every 6 (six) hours as needed for wheezing or shortness of breath.   atorvastatin (LIPITOR) 40 MG tablet Take 40 mg by mouth daily.   Budeson-Glycopyrrol-Formoterol (BREZTRI AEROSPHERE) 160-9-4.8 MCG/ACT AERO Inhale 2 puffs into the lungs 2 (two) times daily.   Cholecalciferol 125 MCG (5000 UT) TABS Take 5,000 Units by mouth daily.   escitalopram (LEXAPRO) 10 MG tablet Take 10 mg by mouth daily.   famotidine (PEPCID) 10 MG tablet Take 10 mg by mouth at bedtime.   fexofenadine (ALLEGRA ALLERGY) 180 MG tablet Take 1 tablet (180 mg total) by mouth daily.   fluticasone (FLONASE) 50 MCG/ACT nasal spray Place 2 sprays into both nostrils daily.   Fluticasone-Umeclidin-Vilant (TRELEGY ELLIPTA) 200-62.5-25 MCG/INH AEPB    ibuprofen (ADVIL) 200 MG tablet Take 400 mg by mouth every 6 (six) hours as needed for headache or moderate pain.   levothyroxine (SYNTHROID) 75 MCG tablet Take 75 mcg by mouth See admin instructions. Alternate every other day with 50 mcg   levothyroxine (SYNTHROID, LEVOTHROID) 50 MCG tablet Take 1 tablet by mouth every other day. Alternate every  other day  with 75 mcg   montelukast (SINGULAIR) 10 MG tablet Take 1 tablet (10 mg total) by mouth at bedtime.   Multiple Vitamins-Minerals (WOMENS 50+ MULTI VITAMIN/MIN PO)    olmesartan (BENICAR) 20 MG tablet Take 20 mg by mouth daily.   Tezepelumab-ekko (TEZSPIRE) 210 MG/1.91ML SOAJ Inject 210 mg into the skin every 28 (twenty-eight) days.   Facility-Administered Encounter Medications as of 03/11/2022  Medication   0.9 %  sodium chloride infusion     Immunization History  Administered Date(s) Administered   Influenza,inj,Quad PF,6+ Mos 11/05/2018   Influenza-Unspecified 10/21/2017, 10/11/2021   PFIZER(Purple Top)SARS-COV-2 Vaccination 11/03/2019, 05/15/2020     PFTs    Latest Ref Rng & Units 12/11/2020    8:58 AM  PFT Results  FVC-Pre L 2.65   FVC-Predicted Pre % 85   FVC-Post L 2.69   FVC-Predicted Post % 86   Pre FEV1/FVC % % 59   Post FEV1/FCV % % 60   FEV1-Pre L 1.55   FEV1-Predicted Pre % 65   FEV1-Post L 1.62   DLCO uncorrected ml/min/mmHg 7.62   DLCO UNC% % 39   DLCO corrected ml/min/mmHg 7.62   DLCO COR %Predicted % 39   DLVA Predicted % 46   TLC L 5.01   TLC % Predicted % 100   RV % Predicted % 104     Eosinophils Most recent blood eosinophil count was 100 cells/microL taken on 11/15/2021.   IgE: 100 on 11/15/2021  Assessment   Biologics training for tezepulumab (Cheron Every  Goals of therapy: Mechanism: human monoclonal IgG2? antibody that binds to TSLP. This blocks TSLP  from its effect on inflammation including reduce eosinophils, IgE, FeNO, IL-5, and IL-13. Mechanism is not definitively established. Reviewed that Cheron Every is add-on medication and patient must continue maintenance inhaler regimen. Response to therapy: may take 3-4 months to determine efficacy.  Side effects: injection site reaction (6-18%), antibody development (2%), arthralgia (4%), back pain (4%), pharyngitis (4%)  Dose: Tezspire 210 mg once every 4  weeks  Administration/Storage:  Reviewed administration sites of thigh or abdomen (at least 2-3 inches away from abdomen). Reviewed the upper arm is only appropriate if caregiver is administering injection  Do not shake pen/syringe as this could lead to product foaming or precipitation. Do not shake syringe as this could lead to product foaming or precipitation.  Access: Approval of Tezspire through: insurance She has already received medication at home  Patient self-administered Tezspire '210mg'$ /1.91 ml in right lower abdomen using sample Tezspire '210mg'$ /1.91 ml Autoinjector pen NDC: [id number] Lot: [id number] Expiration: 12/21/2023  Patient monitored for 30 minutes for adverse reaction.  Patient tolerated without issue. Injection site checked and no redness or swelling noted. Pt denies itchiness and irritation.  Medication Reconciliation  A drug regimen assessment was performed, including review of allergies, interactions, disease-state management, dosing and immunization history. Medications were reviewed with the patient, including name, instructions, indication, goals of therapy, potential side effects, importance of adherence, and safe use.  Drug interaction(s): none noted  Discussed importance of timing live vaccines accordingly. Advised that flu shot, pneumonia, COVID19, shingles are not live vaccines.  PLAN Continue Tezspire '210mg'$  SQ every 28 days.  Rx sent to: [facility]: [phone].   Continue maintenance asthma regimen of: Breztri 160-9-4.28mg (2 puffs twice daily), montelukast '10mg'$  nightly, Flonase daily, fexofenadine daily  All questions encouraged and answered.  Instructed patient to reach out with any further questions or concerns.  Thank you for allowing pharmacy to participate in this patient's care.  This appointment required 45 minutes of patient care (this includes precharting, chart review, review of results, face-to-face care, etc.).   DKnox Saliva PharmD, MPH, BCPS, CPP Clinical Pharmacist (Rheumatology and Pulmonology)

## 2022-03-11 NOTE — Patient Instructions (Signed)
Your next TEZSPIRE dose is due on 04/08/2022, 05/06/2022, and every 4 weeks thereafter  CONTINUE Breztri 160-9-4.43mg (2 puffs twice daily), montelukast 184mnightly, Flonase daily, fexofenadine daily  [facility] [phone] call to schedule shipment and confirm address. They will mail your medication to your home.   You will need to be seen by your provider in 3 to 4 months to assess how TEZSPIRE is working for you. Please ensure you have a follow-up appointment scheduled in MAY or JUNE 2024. Call our clinic if you need to make this appointment.  How to manage an injection site reaction: Remember the 5 C's: COUNTER - leave on the counter at least 30 minutes but up to overnight to bring medication to room temperature. This may help prevent stinging COLD - place something cold (like an ice gel pack or cold water bottle) on the injection site just before cleansing with alcohol. This may help reduce pain CLARITIN - use Claritin (generic name is loratadine) for the first two weeks of treatment or the day of, the day before, and the day after injecting. This will help to minimize injection site reactions CORTISONE CREAM - apply if injection site is irritated and itching CALL ME - if injection site reaction is bigger than the size of your fist, looks infected, blisters, or if you develop hives

## 2022-03-29 ENCOUNTER — Telehealth: Payer: Self-pay | Admitting: Adult Health

## 2022-03-29 DIAGNOSIS — J449 Chronic obstructive pulmonary disease, unspecified: Secondary | ICD-10-CM | POA: Diagnosis not present

## 2022-03-29 DIAGNOSIS — U071 COVID-19: Secondary | ICD-10-CM | POA: Diagnosis not present

## 2022-03-29 NOTE — Telephone Encounter (Signed)
Patient called to reschedule 3/11 appointment due to scheduling conflict. Patient rescheduled and notified.

## 2022-04-01 ENCOUNTER — Inpatient Hospital Stay: Payer: BC Managed Care – PPO | Admitting: Adult Health

## 2022-04-01 DIAGNOSIS — U071 COVID-19: Secondary | ICD-10-CM | POA: Diagnosis not present

## 2022-04-12 DIAGNOSIS — E559 Vitamin D deficiency, unspecified: Secondary | ICD-10-CM | POA: Diagnosis not present

## 2022-04-12 DIAGNOSIS — E039 Hypothyroidism, unspecified: Secondary | ICD-10-CM | POA: Diagnosis not present

## 2022-04-16 ENCOUNTER — Encounter: Payer: Self-pay | Admitting: Adult Health

## 2022-04-16 ENCOUNTER — Inpatient Hospital Stay: Payer: BC Managed Care – PPO | Attending: Adult Health | Admitting: Adult Health

## 2022-04-16 VITALS — BP 121/63 | HR 92 | Temp 97.4°F | Resp 18 | Ht 64.0 in | Wt 178.7 lb

## 2022-04-16 DIAGNOSIS — I2510 Atherosclerotic heart disease of native coronary artery without angina pectoris: Secondary | ICD-10-CM | POA: Diagnosis not present

## 2022-04-16 DIAGNOSIS — M8588 Other specified disorders of bone density and structure, other site: Secondary | ICD-10-CM | POA: Diagnosis not present

## 2022-04-16 DIAGNOSIS — Z853 Personal history of malignant neoplasm of breast: Secondary | ICD-10-CM | POA: Insufficient documentation

## 2022-04-16 DIAGNOSIS — Z9012 Acquired absence of left breast and nipple: Secondary | ICD-10-CM | POA: Insufficient documentation

## 2022-04-16 DIAGNOSIS — J4489 Other specified chronic obstructive pulmonary disease: Secondary | ICD-10-CM | POA: Insufficient documentation

## 2022-04-16 DIAGNOSIS — E7800 Pure hypercholesterolemia, unspecified: Secondary | ICD-10-CM | POA: Insufficient documentation

## 2022-04-16 DIAGNOSIS — E039 Hypothyroidism, unspecified: Secondary | ICD-10-CM | POA: Diagnosis not present

## 2022-04-16 DIAGNOSIS — Z87891 Personal history of nicotine dependence: Secondary | ICD-10-CM | POA: Diagnosis not present

## 2022-04-16 DIAGNOSIS — C50412 Malignant neoplasm of upper-outer quadrant of left female breast: Secondary | ICD-10-CM

## 2022-04-16 DIAGNOSIS — I10 Essential (primary) hypertension: Secondary | ICD-10-CM | POA: Insufficient documentation

## 2022-04-16 DIAGNOSIS — Z171 Estrogen receptor negative status [ER-]: Secondary | ICD-10-CM

## 2022-04-16 NOTE — Assessment & Plan Note (Signed)
Dorothy Frederick is a 67 year old woman with a history of left-sided stage IIa ER/PR negative HER2 positive breast cancer diagnosed in July 2020.  She is status post left mastectomy followed by adjuvant chemotherapy with Taxol and Herceptin along with Herceptin maintenance to complete 1 year of HER2 therapy.  1.  HER2 positive breast cancer: She is now 4 years out from her diagnosis and has no clinical or radiographic signs of breast cancer recurrence.  She will continue to undergo annual right breast screening mammograms next due in July 2024.  I will see her back in 1 years time for her annual exam.  2.  Asthma and 38-pack-year tobacco history: She is at high risk for lung cancer and undergoes annual lung cancer screening with pulmonology.  Her lungs have been improved and she has follow-up with Dr. Valeta Harms next week.  3.  Bone health: She has mild osteopenia with a T-score -1.6.  We discussed bone health today we will repeat bone density testing in July 2025.  4.  Health maintenance: She will continue to follow-up with her primary care provider she is also seeing cardiology for her coronary artery disease that was noted on her most recent CT scan in December 2023.  She is already on a statin.  I encouraged her to continue healthy diet and exercise as she regains her strength.

## 2022-04-16 NOTE — Progress Notes (Signed)
[facility] Follow up:    Garner Nash, DO [address] [place] [address]   DIAGNOSIS:  Cancer Staging  Malignant neoplasm of upper-outer quadrant of left breast in female, estrogen receptor negative ([place]) Staging form: Breast, AJCC 8th Edition - Clinical stage from 07/22/2018: Stage IA (cT1c, cN0, cM0, G3, ER-, PR-, HER2+) - Unsigned Stage prefix: Initial diagnosis Histologic grading system: 3 grade system - Pathologic stage from 09/16/2018: Stage IIA (pT2, pN0, cM0, G3, ER-, PR-, HER2+) - Unsigned Stage prefix: Initial diagnosis Histologic grading system: 3 grade system   SUMMARY OF ONCOLOGIC HISTORY: Oncology History  Malignant neoplasm of upper-outer quadrant of left breast in female, estrogen receptor negative ([place])  07/15/2018 Initial Diagnosis   Malignant neoplasm of upper-outer quadrant of left breast in female, estrogen receptor negative ([place])   07/29/2018 Genetic Testing   Genetic testing reported out on 07/29/2018 through the [facility] Common Hereditary cancer panel found no pathogenic mutations.   The Common Hereditary Cancers Panel offered by [facility] includes sequencing and/or deletion duplication testing of the following 47 genes: APC, ATM, AXIN2, BARD1, BMPR1A, BRCA1, BRCA2, BRIP1, CDH1, CDKN2A (p14ARF), CDKN2A (p16INK4a), CKD4, CHEK2, CTNNA1, DICER1, EPCAM (Deletion/duplication testing only), GREM1 (promoter region deletion/duplication testing only), KIT, MEN1, MLH1, MSH2, MSH3, MSH6, MUTYH, NBN, NF1, NHTL1, PALB2, PDGFRA, PMS2, POLD1, POLE, PTEN, RAD50, RAD51C, RAD51D, SDHB, SDHC, SDHD, SMAD4, SMARCA4. STK11, TP53, TSC1, TSC2, and VHL.  The following genes were evaluated for sequence changes only: SDHA and HOXB13 c.251G>A variant only.   09/16/2018 Surgery   Left mastectomy [facility]) [phone]): invasive ductal carcinoma, grade 3, with high grade DCIS. Negative margins. 6 lymph nodes were negative for carcinoma. ER/PR negative. Her-2  positive. Ki67 70%.   10/21/2018 - 10/06/2019 Chemotherapy   PACLitaxel (TAXOL) 150 mg in sodium chloride 0.9 % 250 mL chemo infusion (</= 80mg /m2), 80 mg/m2 = 150 mg, Intravenous,  Once, 2 of 2 cycles. Administration: 150 mg (10/21/2018), 150 mg (10/29/2018), 150 mg (11/19/2018), 150 mg (11/05/2018), 150 mg (11/12/2018), 150 mg (11/26/2018), 150 mg (12/10/2018), 150 mg (12/16/2018).  trastuzumab-dkst (OGIVRI) 300 mg in sodium chloride 0.9 % 250 mL chemo infusion, 315 mg, Intravenous,  Once, 16 of 16 cycles. Administration: 300 mg (10/21/2018), 150 mg (11/19/2018), 450 mg (12/24/2018), 450 mg (01/13/2019), 450 mg (03/17/2019), 450 mg (04/07/2019), 450 mg (06/09/2019), 450 mg (06/30/2019), 450 mg (09/08/2019), 150 mg (11/05/2018), 150 mg (11/12/2018), 150 mg (11/26/2018), 150 mg (12/10/2018), 150 mg (12/16/2018), 450 mg (02/03/2019), 450 mg (02/24/2019), 450 mg (04/28/2019), 450 mg (05/19/2019), 450 mg (07/28/2019), 450 mg (08/18/2019), 450 mg (10/06/2019).     CURRENT THERAPY: observation  INTERVAL HISTORY: Quashonda Fantin 67 y.o. female returns for follow-up of her history of HER2 positive breast cancer she is being well today.    She notes that she had had an increase in her asthma symptoms because of the [place].  Since then she has gotten in with pulmonology and has gotten on a regimen that has improved her ability to breathe.  She notes that now she can walk up a flight of stairs and not feel as if she is dying.  She has been able to resume strength training after starting this SPR in January and is slowly rebuilding her strength.  She is also working on increasing her steps.  Her most recent mammogram occurred on July 23, 2021 demonstrating no mammographic evidence of malignancy and breast density category B.  Also underwent bone density testing on the same day  which demonstrated osteopenia with a T-score -1.6 in the AP spine.  She also undergoes annual lung cancer screening due to her 38-pack-year tobacco  history with pulmonology.  Her most recent imaging in December 2023 was stable.  Patient Active Problem List   Diagnosis Date Noted   Asthma-COPD overlap syndrome 12/14/2021   Allergic rhinitis 12/14/2021   Upper airway cough syndrome 12/14/2021   Acquired absence of left breast 10/15/2019   Genetic testing 07/30/2018   Family history of breast cancer    Family history of prostate cancer    Family history of lung cancer    Family history of esophageal cancer    Malignant neoplasm of upper-outer quadrant of left breast in female, estrogen receptor negative ([place]) 07/15/2018   CAD due to calcified coronary lesion 07/30/2014   HTN (hypertension) 11/08/2013   Abnormal nuclear stress test 10/16/2013   Abnormal ECG 09/15/2013   Hypercholesterolemia 09/15/2013   History of tobacco use 09/15/2013   Exertional dyspnea 09/15/2013    is allergic to benadryl [diphenhydramine hcl (sleep)] and sulfa antibiotics.  MEDICAL HISTORY: Past Medical History:  Diagnosis Date   Angina at rest    Anxiety    Cancer [facility]) 2000   left breast cancer-partial mastectomy   COPD (chronic obstructive pulmonary disease) ([place])    Depression    Family history of breast cancer    Family history of esophageal cancer    Family history of lung cancer    Family history of prostate cancer    GERD (gastroesophageal reflux disease)    Hyperlipidemia    Hypertension    Hypothyroidism    Recurrent breast cancer, left ([place])     SURGICAL HISTORY: Past Surgical History:  Procedure Laterality Date   ADENOIDECTOMY     APPLICATION OF WOUND VAC Left 04/01/2019   Procedure: Placement of provena wound vac;  Surgeon: Wallace Going, DO;  Location: [facility];  Service: Plastics;  Laterality: Left;   CARDIAC CATHETERIZATION     DEBRIDEMENT AND CLOSURE WOUND Left 04/01/2019   Procedure: Left breast wound excision and closure;  Surgeon: Wallace Going, DO;  Location: [facility];   Service: Plastics;  Laterality: Left;   LEFT HEART CATHETERIZATION WITH CORONARY ANGIOGRAM N/A 10/21/2013   Procedure: LEFT HEART CATHETERIZATION WITH CORONARY ANGIOGRAM;  Surgeon: Burnell Blanks, MD;  Location: [facility] CATH LAB;  Service: Cardiovascular;  Laterality: N/A;   MASTECTOMY W/ SENTINEL NODE BIOPSY Left 09/16/2018   Procedure: LEFT TOTAL MASTECTOMY WITH LEFT AXILLARY DEEP SENTINEL LYMPH NODE BIOPSY AND INJECT BLUE DYE;  Surgeon: Fanny Skates, MD;  Location: [facility];  Service: General;  Laterality: Left;   MASTECTOMY, PARTIAL Left 2000   PORT-A-CATH REMOVAL Right 10/20/2019   Procedure: REMOVAL PORT-A-CATH;  Surgeon: Coralie Keens, MD;  Location: [facility];  Service: General;  Laterality: Right;   PORTACATH PLACEMENT Right 09/16/2018   Procedure: INSERTION PORT-A-CATH;  Surgeon: Fanny Skates, MD;  Location: [facility];  Service: General;  Laterality: Right;   TONSILLECTOMY      SOCIAL HISTORY: Social History   Socioeconomic History   Marital status: Single    Spouse name: Not on file   Number of children: Not on file   Years of education: Not on file   Highest education level: Not on file  Occupational History   Not on file  Tobacco Use   Smoking status: Former    Packs/day: 1.50    Years: 38.00  Additional pack years: 0.00    Total pack years: 57.00    Types: Cigarettes    Start date: 56    Quit date: 01/21/2013    Years since quitting: 9.2   Smokeless tobacco: Never  Substance and Sexual Activity   Alcohol use: No   Drug use: No   Sexual activity: Not Currently    Birth control/protection: Post-menopausal  Other Topics Concern   Not on file  Social History Narrative   ** Merged History Encounter **       Social Determinants of Health   Financial Resource Strain: Not on file  Food Insecurity: No Food Insecurity (11/20/2021)   Hunger Vital Sign    Worried About Running Out of Food in the Last  Year: Never true    Ran Out of Food in the Last Year: Never true  Transportation Needs: No Transportation Needs (11/20/2021)   PRAPARE - Hydrologist (Medical): No    Lack of Transportation (Non-Medical): No  Physical Activity: Not on file  Stress: Not on file  Social Connections: Not on file  Intimate Partner Violence: Not on file    FAMILY HISTORY: Family History  Problem Relation Age of Onset   Heart disease Mother    Hypertension Father    Cancer Father    Prostate cancer Father    Hypertension Sister    Breast cancer Sister        dx 63s   Heart disease Sister    Hypertension Brother    Hyperlipidemia Maternal Grandmother    Stroke Maternal Grandmother    Hypertension Brother    Breast cancer Maternal Aunt    Lung cancer Maternal Aunt    Esophageal cancer Maternal Aunt    Stomach cancer Paternal Grandmother     Review of Systems  Constitutional:  Negative for appetite change, chills, fatigue, fever and unexpected weight change.  HENT:   Negative for hearing loss, lump/mass and trouble swallowing.   Eyes:  Negative for eye problems and icterus.  Respiratory:  Negative for chest tightness, cough and shortness of breath.   Cardiovascular:  Negative for chest pain, leg swelling and palpitations.  Gastrointestinal:  Negative for abdominal distention, abdominal pain, constipation, diarrhea, nausea and vomiting.  Endocrine: Negative for hot flashes.  Genitourinary:  Negative for difficulty urinating.   Musculoskeletal:  Negative for arthralgias.  Skin:  Negative for itching and rash.  Neurological:  Negative for dizziness, extremity weakness, headaches and numbness.  Hematological:  Negative for adenopathy. Does not bruise/bleed easily.  Psychiatric/Behavioral:  Negative for depression. The patient is not nervous/anxious.       PHYSICAL EXAMINATION  ECOG PERFORMANCE STATUS: 1 - Symptomatic but completely ambulatory  Vitals:   04/16/22  0842  BP: 121/63  Pulse: 92  Resp: 18  Temp: (!) 97.4 F (36.3 C)  SpO2: 100%    Physical Exam Constitutional:      General: She is not in acute distress.    Appearance: Normal appearance. She is not toxic-appearing.  HENT:     Head: Normocephalic and atraumatic.  Eyes:     General: No scleral icterus. Cardiovascular:     Rate and Rhythm: Normal rate and regular rhythm.     Pulses: Normal pulses.     Heart sounds: Normal heart sounds.  Pulmonary:     Effort: Pulmonary effort is normal.     Breath sounds: Normal breath sounds.  Chest:     Comments: Left breast status postmastectomy  no sign of local recurrence right breast is benign. Abdominal:     General: Abdomen is flat. Bowel sounds are normal. There is no distension.     Palpations: Abdomen is soft.     Tenderness: There is no abdominal tenderness.  Musculoskeletal:        General: No swelling.     Cervical back: Neck supple.  Lymphadenopathy:     Cervical: No cervical adenopathy.  Skin:    General: Skin is warm and dry.     Findings: No rash.  Neurological:     General: No focal deficit present.     Mental Status: She is alert.  Psychiatric:        Mood and Affect: Mood normal.        Behavior: Behavior normal.     LABORATORY DATA: None for this visit  ASSESSMENT and THERAPY PLAN:   Malignant neoplasm of upper-outer quadrant of left breast in female, estrogen receptor negative ([place]) Dorothy Frederick is a 67 year old woman with a history of left-sided stage IIa ER/PR negative HER2 positive breast cancer diagnosed in July 2020.  She is status post left mastectomy followed by adjuvant chemotherapy with Taxol and Herceptin along with Herceptin maintenance to complete 1 year of HER2 therapy.  1.  HER2 positive breast cancer: She is now 4 years out from her diagnosis and has no clinical or radiographic signs of breast cancer recurrence.  She will continue to undergo annual right breast screening mammograms next due in July  2024.  I will see her back in 1 years time for her annual exam.  2.  Asthma and 38-pack-year tobacco history: She is at high risk for lung cancer and undergoes annual lung cancer screening with pulmonology.  Her lungs have been improved and she has follow-up with Dr. Valeta Harms next week.  3.  Bone health: She has mild osteopenia with a T-score -1.6.  We discussed bone health today we will repeat bone density testing in July 2025.  4.  Health maintenance: She will continue to follow-up with her primary care provider she is also seeing cardiology for her coronary artery disease that was noted on her most recent CT scan in December 2023.  She is already on a statin.  I encouraged her to continue healthy diet and exercise as she regains her strength.   All questions were answered. The patient knows to call the clinic with any problems, questions or concerns. We can certainly see the patient much sooner if necessary.  Total encounter time:30 minutes*in face-to-face visit time, chart review, lab review, care coordination, order entry, and documentation of the encounter time.  Wilber Bihari, NP 04/16/22 9:38 AM Medical Oncology and Hematology [facility] [place], [place] [address] Tel. [phone]    Fax. [phone]  *Total Encounter Time as defined by the Centers for Medicare and Medicaid Services includes, in addition to the face-to-face time of a patient visit (documented in the note above) non-face-to-face time: obtaining and reviewing outside history, ordering and reviewing medications, tests or procedures, care coordination (communications with other health care professionals or caregivers) and documentation in the medical record.

## 2022-04-17 DIAGNOSIS — E039 Hypothyroidism, unspecified: Secondary | ICD-10-CM | POA: Diagnosis not present

## 2022-04-17 DIAGNOSIS — F411 Generalized anxiety disorder: Secondary | ICD-10-CM | POA: Diagnosis not present

## 2022-04-17 DIAGNOSIS — G4700 Insomnia, unspecified: Secondary | ICD-10-CM | POA: Diagnosis not present

## 2022-04-17 DIAGNOSIS — E559 Vitamin D deficiency, unspecified: Secondary | ICD-10-CM | POA: Diagnosis not present

## 2022-04-25 NOTE — Progress Notes (Signed)
Synopsis: Referred in August 2022 for lung nodule, pleural effusion, PCP: By Josephine Igo, DO  Subjective:   PATIENT ID: Dorothy Frederick GENDER: female DOB: Mar 29, 1955, MRN: [id number]  Chief Complaint  Patient presents with   Follow-up    F/up    This is a 68 year old female, history of left breast cancer, partial mastectomy in 2000, COPD, family history of breast cancer, esophageal cancer, lung cancer.  Patient has hyperlipidemia and hypertension.She was seen in the emergency department on 09/08/2020.  CT imaging in the emergency department was complete which revealed a 1.6 cm nodular opacity within the right middle lobe, moderate to severe emphysema and a small right-sided pleural effusion.  Patient was referred here for follow-up to discuss next steps.  As well as to establish care with pulmonologist.  OV 09/13/2020: Has no respiratory complaints today.  Was seen in the ER for right-sided chest pains.  From respiratory standpoint she feels okay.  Chest pains have resolved.  Reviewed CT imaging today in the office with the patient.  She is already enrolled in her lung cancer screening that was last completed in January.  She is a former smoker.  OV 12/11/2020: here today after PFT follow up.  Patient had pulmonary function test completed prior to today's office visit which revealed a reduced ratio of 60, FEV1 of 68% predicted, no significant bronchodilator response, total lung volumes within normal limits, severely reduced DLCO at 39% predicted.  Using Trelegy daily and as needed albuterol for her COPD management.  She had a CT scan of the chest in October 2022 which revealed resolution of the right middle lobe nodule that was initially seen and sent for referral.  OV 11/15/2021: This is a 67 year old female here today for follow-up.  She has COPD with a DLCO of 39% predicted.  She been on Trelegy.  She has had recurrent exacerbations she is on antibiotics and steroids.  Last time she is on  steroids about a month ago.  She still having increased shortness of breath and nocturnal symptoms.  She still having ongoing shortness of breath not really sure what to do next.  She does have allergies she has had some asthma symptoms in the past her prior CBC with differential has total eosinophil count of 200.  OV 04/25/2021: Last office visit we attempted to get Dupixent approved but looks like we were able to at least get her started on tezepelumab.  She is here today for follow-up after initiation of biologic therapy. Patient last seen in the office on 12/14/2021 by Rhunette Croft, NP for follow-up.  At that time her RAST panel was negative she had mildly elevated eosinophils.  Looks like she was doing well on [place], as needed albuterol, Singulair, Flonase Allegra.  She started the process at that time for approval with tezepelumab.  Patient was successfully started on tezepelumab and has already followed up with pharmacy.  She has had 2 injections since last office visit.  Has no complaints today.  She is doing really well with her new injection regimen.  She feels so much better.  Breathing so much better.  The first time she is getting full nights rest of sleep.   Oncology History  Malignant neoplasm of upper-outer quadrant of left breast in female, estrogen receptor negative  07/15/2018 Initial Diagnosis   Malignant neoplasm of upper-outer quadrant of left breast in female, estrogen receptor negative (HCC)   07/29/2018 Genetic Testing   Genetic testing reported out on 07/29/2018 through the  [facility] Common Hereditary cancer panel found no pathogenic mutations.   The Common Hereditary Cancers Panel offered by [facility] includes sequencing and/or deletion duplication testing of the following 47 genes: APC, ATM, AXIN2, BARD1, BMPR1A, BRCA1, BRCA2, BRIP1, CDH1, CDKN2A (p14ARF), CDKN2A (p16INK4a), CKD4, CHEK2, CTNNA1, DICER1, EPCAM (Deletion/duplication testing only), GREM1 (promoter region  deletion/duplication testing only), KIT, MEN1, MLH1, MSH2, MSH3, MSH6, MUTYH, NBN, NF1, NHTL1, PALB2, PDGFRA, PMS2, POLD1, POLE, PTEN, RAD50, RAD51C, RAD51D, SDHB, SDHC, SDHD, SMAD4, SMARCA4. STK11, TP53, TSC1, TSC2, and VHL.  The following genes were evaluated for sequence changes only: SDHA and HOXB13 c.251G>A variant only.   09/16/2018 Surgery   Left mastectomy Derrell Lolling([place]) [id number]): invasive ductal carcinoma, grade 3, with high grade DCIS. Negative margins. 6 lymph nodes were negative for carcinoma. ER/PR negative. Her-2 positive. Ki67 70%.   10/21/2018 - 10/06/2019 Chemotherapy   PACLitaxel (TAXOL) 150 mg in sodium chloride 0.9 % 250 mL chemo infusion (</= 80mg /m2), 80 mg/m2 = 150 mg, Intravenous,  Once, 2 of 2 cycles. Administration: 150 mg (10/21/2018), 150 mg (10/29/2018), 150 mg (11/19/2018), 150 mg (11/05/2018), 150 mg (11/12/2018), 150 mg (11/26/2018), 150 mg (12/10/2018), 150 mg (12/16/2018).  trastuzumab-dkst (OGIVRI) 300 mg in sodium chloride 0.9 % 250 mL chemo infusion, 315 mg, Intravenous,  Once, 16 of 16 cycles. Administration: 300 mg (10/21/2018), 150 mg (11/19/2018), 450 mg (12/24/2018), 450 mg (01/13/2019), 450 mg (03/17/2019), 450 mg (04/07/2019), 450 mg (06/09/2019), 450 mg (06/30/2019), 450 mg (09/08/2019), 150 mg (11/05/2018), 150 mg (11/12/2018), 150 mg (11/26/2018), 150 mg (12/10/2018), 150 mg (12/16/2018), 450 mg (02/03/2019), 450 mg (02/24/2019), 450 mg (04/28/2019), 450 mg (05/19/2019), 450 mg (07/28/2019), 450 mg (08/18/2019), 450 mg (10/06/2019).       Past Medical History:  Diagnosis Date   Angina at rest    Anxiety    Cancer 2000   left breast cancer-partial mastectomy   COPD (chronic obstructive pulmonary disease)    Depression    Family history of breast cancer    Family history of esophageal cancer    Family history of lung cancer    Family history of prostate cancer    GERD (gastroesophageal reflux disease)    Hyperlipidemia    Hypertension    Hypothyroidism    Recurrent  breast cancer, left      Family History  Problem Relation Age of Onset   Heart disease Mother    Hypertension Father    Cancer Father    Prostate cancer Father    Hypertension Sister    Breast cancer Sister        dx 3150s   Heart disease Sister    Hypertension Brother    Hyperlipidemia Maternal Grandmother    Stroke Maternal Grandmother    Hypertension Brother    Breast cancer Maternal Aunt    Lung cancer Maternal Aunt    Esophageal cancer Maternal Aunt    Stomach cancer Paternal Grandmother      Past Surgical History:  Procedure Laterality Date   ADENOIDECTOMY     APPLICATION OF WOUND VAC Left 04/01/2019   Procedure: Placement of provena wound vac;  Surgeon: Peggye Formillingham, Claire S, DO;  Location: [facility];  Service: Plastics;  Laterality: Left;   CARDIAC CATHETERIZATION     DEBRIDEMENT AND CLOSURE WOUND Left 04/01/2019   Procedure: Left breast wound excision and closure;  Surgeon: Peggye Formillingham, Claire S, DO;  Location: [facility];  Service: Plastics;  Laterality: Left;   LEFT HEART CATHETERIZATION WITH CORONARY ANGIOGRAM N/A 10/21/2013  Procedure: LEFT HEART CATHETERIZATION WITH CORONARY ANGIOGRAM;  Surgeon: Kathleene Hazel, MD;  Location: [facility] C[facility];  Service: Cardiovascular;  Laterality: N/A;   MASTECTOMY W/ SENTINEL NODE BIOPSY Left 09/16/2018   Procedure: LEFT TOTAL MASTECTOMY WITH LEFT AXILLARY DEEP SENTINEL LYMPH NODE BIOPSY AND INJECT BLUE DYE;  Surgeon: Claud Kelp, MD;  Location: [facility];  Service: General;  Laterality: Left;   MASTECTOMY, PARTIAL Left 2000   PORT-A-CATH REMOVAL Right 10/20/2019   Procedure: REMOVAL PORT-A-CATH;  Surgeon: Abigail Miyamoto, MD;  Location: [facility];  Service: General;  Laterality: Right;   PORTACATH PLACEMENT Right 09/16/2018   Procedure: INSERTION PORT-A-CATH;  Surgeon: Claud Kelp, MD;  Location: [facility];  Service: General;  Laterality: Right;    TONSILLECTOMY      Social History   Socioeconomic History   Marital status: Single    Spouse name: Not on file   Number of children: Not on file   Years of education: Not on file   Highest education level: Not on file  Occupational History   Not on file  Tobacco Use   Smoking status: Former    Packs/day: 1.50    Years: 38.00    Additional pack years: 0.00    Total pack years: 57.00    Types: Cigarettes    Start date: 59    Quit date: 01/21/2013    Years since quitting: 9.2   Smokeless tobacco: Never  Substance and Sexual Activity   Alcohol use: No   Drug use: No   Sexual activity: Not Currently    Birth control/protection: Post-menopausal  Other Topics Concern   Not on file  Social History Narrative   ** Merged History Encounter **       Social Determinants of Health   Financial Resource Strain: Not on file  Food Insecurity: No Food Insecurity (11/20/2021)   Hunger Vital Sign    Worried About Running Out of Food in the Last Year: Never true    Ran Out of Food in the Last Year: Never true  Transportation Needs: No Transportation Needs (11/20/2021)   PRAPARE - Administrator, Civil Service (Medical): No    Lack of Transportation (Non-Medical): No  Physical Activity: Not on file  Stress: Not on file  Social Connections: Not on file  Intimate Partner Violence: Not on file     Allergies  Allergen Reactions   Benadryl [Diphenhydramine Hcl (Sleep)] Palpitations    Increased heartrate   Sulfa Antibiotics Rash     Outpatient Medications Prior to Visit  Medication Sig Dispense Refill   albuterol (PROVENTIL HFA;VENTOLIN HFA) 108 (90 BASE) MCG/ACT inhaler Inhale 2 puffs into the lungs every 6 (six) hours as needed for wheezing or shortness of breath.     atorvastatin (LIPITOR) 40 MG tablet Take 40 mg by mouth daily.     Budeson-Glycopyrrol-Formoterol (BREZTRI AEROSPHERE) 160-9-4.8 MCG/ACT AERO Inhale 2 puffs into the lungs 2 (two) times daily. 10.7 g 5    Cholecalciferol 125 MCG (5000 UT) TABS Take 5,000 Units by mouth daily.     escitalopram (LEXAPRO) 10 MG tablet Take 10 mg by mouth daily.  3   famotidine (PEPCID) 10 MG tablet Take 10 mg by mouth at bedtime.     fexofenadine (ALLEGRA ALLERGY) 180 MG tablet Take 1 tablet (180 mg total) by mouth daily. 30 tablet 5   fluticasone (FLONASE) 50 MCG/ACT nasal spray Place 2 sprays into both nostrils daily. 16 g 5  ibuprofen (ADVIL) 200 MG tablet Take 400 mg by mouth every 6 (six) hours as needed for headache or moderate pain.     levothyroxine (SYNTHROID) 75 MCG tablet Take 75 mcg by mouth See admin instructions. Alternate every other day with 50 mcg     levothyroxine (SYNTHROID, LEVOTHROID) 50 MCG tablet Take 1 tablet by mouth every other day. Alternate every other day  with 75 mcg     montelukast (SINGULAIR) 10 MG tablet Take 1 tablet (10 mg total) by mouth at bedtime. 30 tablet 5   Multiple Vitamins-Minerals (WOMENS 50+ MULTI VITAMIN/MIN PO)      olmesartan (BENICAR) 20 MG tablet Take 20 mg by mouth daily.     Tezepelumab-ekko (TEZSPIRE) 210 MG/1. SOAJ Inject 210 mg into the skin every 28 (twenty-eight) days. 1.91 mL 5   No facility-administered medications prior to visit.    Review of Systems  Constitutional:  Negative for chills, fever, malaise/fatigue and weight loss.  HENT:  Negative for hearing loss, sore throat and tinnitus.   Eyes:  Negative for blurred vision and double vision.  Respiratory:  Negative for cough, hemoptysis, sputum production, shortness of breath, wheezing and stridor.   Cardiovascular:  Negative for chest pain, palpitations, orthopnea, leg swelling and PND.  Gastrointestinal:  Negative for abdominal pain, constipation, diarrhea, heartburn, nausea and vomiting.  Genitourinary:  Negative for dysuria, hematuria and urgency.  Musculoskeletal:  Negative for joint pain and myalgias.  Skin:  Negative for itching and rash.  Neurological:  Negative for dizziness,  tingling, weakness and headaches.  Endo/Heme/Allergies:  Negative for environmental allergies. Does not bruise/bleed easily.  Psychiatric/Behavioral:  Negative for depression. The patient is not nervous/anxious and does not have insomnia.   All other systems reviewed and are negative.    Objective:  Physical Exam Vitals reviewed.  Constitutional:      General: She is not in acute distress.    Appearance: She is well-developed.  HENT:     Head: Normocephalic and atraumatic.  Eyes:     General: No scleral icterus.    Conjunctiva/sclera: Conjunctivae normal.     Pupils: Pupils are equal, round, and reactive to light.  Neck:     Vascular: No JVD.     Trachea: No tracheal deviation.  Cardiovascular:     Rate and Rhythm: Normal rate and regular rhythm.     Heart sounds: Normal heart sounds. No murmur heard. Pulmonary:     Effort: Pulmonary effort is normal. No tachypnea, accessory muscle usage or respiratory distress.     Breath sounds: No stridor. No wheezing, rhonchi or rales.  Abdominal:     General: There is no distension.     Palpations: Abdomen is soft.     Tenderness: There is no abdominal tenderness.  Musculoskeletal:        General: No tenderness.     Cervical back: Neck supple.  Lymphadenopathy:     Cervical: No cervical adenopathy.  Skin:    General: Skin is warm and dry.     Capillary Refill: Capillary refill takes less than 2 seconds.     Findings: No rash.  Neurological:     Mental Status: She is alert and oriented to person, place, and time.  Psychiatric:        Behavior: Behavior normal.      Vitals:   04/26/22 0827  BP: 120/70  Pulse: 77  SpO2: 99%  Weight: 177 lb 9.6 oz (80.6 kg)  Height: 5\' 4"  (1.626 m)  99% on RA BMI Readings from Last 3 Encounters:  04/26/22 30.48 kg/m  04/16/22 30.67 kg/m  12/14/21 30.55 kg/m   Wt Readings from Last 3 Encounters:  04/26/22 177 lb 9.6 oz (80.6 kg)  04/16/22 178 lb 11.2 oz (81.1 kg)  12/14/21 178  lb (80.7 kg)     CBC    Component Value Date/Time   WBC 5.6 11/15/2021 1328   RBC 4.33 11/15/2021 1328   HGB 12.4 11/15/2021 1328   HGB 12.3 07/22/2018 0835   HGB 14.1 09/08/2006 1500   HCT 38.2 11/15/2021 1328   HCT 39.7 09/08/2006 1500   PLT 268.0 11/15/2021 1328   PLT 266 07/22/2018 0835   PLT 240 09/08/2006 1500   MCV 88.3 11/15/2021 1328   MCV 87.9 09/08/2006 1500   MCH 28.4 03/30/2021 0822   MCHC 32.4 11/15/2021 1328   RDW 15.4 11/15/2021 1328   RDW 12.0 09/08/2006 1500   LYMPHSABS 1.1 11/15/2021 1328   LYMPHSABS 1.8 09/08/2006 1500   MONOABS 0.6 11/15/2021 1328   MONOABS 0.5 09/08/2006 1500   EOSABS 0.1 11/15/2021 1328   EOSABS 0.1 09/08/2006 1500   BASOSABS 0.0 11/15/2021 1328   BASOSABS 0.1 09/08/2006 1500     Chest Imaging: 09/08/2020 CT chest: Right middle lobe nodular opacity, severe to moderate emphysema throughout the lung, centrilobular. The patient's images have been independently reviewed by me.    October CT chest: 2022: Right middle lobe nodular opacity resolved in comparison to previous images.  I showed this today to the patient in the office.  Severe centrilobular emphysema. The patient's images have been independently reviewed by me.    CT chest 01/04/2022 lung cancer screening CT: Lung RADS 2 benign behavior appearance, originally had a small nodule in the right middle lobe.  Small hiatal hernia.  Will plan for annual CT follow-up. The patient's images have been independently reviewed by me.    Pulmonary Functions Testing Results:    Latest Ref Rng & Units 12/11/2020    8:58 AM  PFT Results  FVC-Pre L 2.65   FVC-Predicted Pre % 85   FVC-Post L 2.69   FVC-Predicted Post % 86   Pre FEV1/FVC % % 59   Post FEV1/FCV % % 60   FEV1-Pre L 1.55   FEV1-Predicted Pre % 65   FEV1-Post L 1.62   DLCO uncorrected ml/min/mmHg 7.62   DLCO UNC% % 39   DLCO corrected ml/min/mmHg 7.62   DLCO COR %Predicted % 39   DLVA Predicted % 46   TLC L 5.01    TLC % Predicted % 100   RV % Predicted % 104     FeNO:   Pathology:   Echocardiogram:   Heart Catheterization:     Assessment & Plan:     ICD-10-CM   1. Asthma-COPD overlap syndrome  J44.89     2. Former smoker  Z87.891     3. Non-seasonal allergic rhinitis due to other allergic trigger  J30.89     4. History of tobacco use  Z87.891     5. Stage 2 moderate COPD by GOLD classification  J44.9     6. Family history of lung cancer  Z80.1     7. Family history of breast cancer  Z80.3     8. Family history of esophageal cancer  Z80.0     9. Nodule of middle lobe of right lung  R91.1       Discussion:  This is a 67 year old female, CT  evidence of centrilobular emphysema, pulmonary function testing with stage II COPD underlying asthma with recurrent flares related to allergies, mild elevation in eosinophils.  She has asthma COPD overlap syndrome and is now treated with triple therapy inhaler regimen plus tezepelumab.  Plan: She is doing really well on her current regimen.  She is breathing the best that she is ever breathe. Continue Singulair, Allegra, Flonase Continue triple therapy inhaler regimen with Breztri plus spacer Continue tezepelumab injections.  Follow-up with me or APP in 1 year or as needed.    Current Outpatient Medications:    albuterol (PROVENTIL HFA;VENTOLIN HFA) 108 (90 BASE) MCG/ACT inhaler, Inhale 2 puffs into the lungs every 6 (six) hours as needed for wheezing or shortness of breath., Disp: , Rfl:    atorvastatin (LIPITOR) 40 MG tablet, Take 40 mg by mouth daily., Disp: , Rfl:    Budeson-Glycopyrrol-Formoterol (BREZTRI AEROSPHERE) 160-9-4.8 MCG/ACT AERO, Inhale 2 puffs into the lungs 2 (two) times daily., Disp: 10.7 g, Rfl: 5   Cholecalciferol 125 MCG (5000 UT) TABS, Take 5,000 Units by mouth daily., Disp: , Rfl:    escitalopram (LEXAPRO) 10 MG tablet, Take 10 mg by mouth daily., Disp: , Rfl: 3   famotidine (PEPCID) 10 MG tablet, Take 10 mg by  mouth at bedtime., Disp: , Rfl:    fexofenadine (ALLEGRA ALLERGY) 180 MG tablet, Take 1 tablet (180 mg total) by mouth daily., Disp: 30 tablet, Rfl: 5   fluticasone (FLONASE) 50 MCG/ACT nasal spray, Place 2 sprays into both nostrils daily., Disp: 16 g, Rfl: 5   ibuprofen (ADVIL) 200 MG tablet, Take 400 mg by mouth every 6 (six) hours as needed for headache or moderate pain., Disp: , Rfl:    levothyroxine (SYNTHROID) 75 MCG tablet, Take 75 mcg by mouth See admin instructions. Alternate every other day with 50 mcg, Disp: , Rfl:    levothyroxine (SYNTHROID, LEVOTHROID) 50 MCG tablet, Take 1 tablet by mouth every other day. Alternate every other day  with 75 mcg, Disp: , Rfl:    montelukast (SINGULAIR) 10 MG tablet, Take 1 tablet (10 mg total) by mouth at bedtime., Disp: 30 tablet, Rfl: 5   Multiple Vitamins-Minerals (WOMENS 50+ MULTI VITAMIN/MIN PO), , Disp: , Rfl:    olmesartan (BENICAR) 20 MG tablet, Take 20 mg by mouth daily., Disp: , Rfl:    Tezepelumab-ekko (TEZSPIRE) 210 MG/1. SOAJ, Inject 210 mg into the skin every 28 (twenty-eight) days., Disp: 1.91 mL, Rfl: 5  Josephine Igo, DO [facility] 04/26/2022 8:43 AM

## 2022-04-26 ENCOUNTER — Encounter: Payer: Self-pay | Admitting: Adult Health

## 2022-04-26 ENCOUNTER — Ambulatory Visit (INDEPENDENT_AMBULATORY_CARE_PROVIDER_SITE_OTHER): Payer: BC Managed Care – PPO | Admitting: Pulmonary Disease

## 2022-04-26 ENCOUNTER — Encounter: Payer: Self-pay | Admitting: Pulmonary Disease

## 2022-04-26 VITALS — BP 120/70 | HR 77 | Ht 64.0 in | Wt 177.6 lb

## 2022-04-26 DIAGNOSIS — Z87891 Personal history of nicotine dependence: Secondary | ICD-10-CM

## 2022-04-26 DIAGNOSIS — R911 Solitary pulmonary nodule: Secondary | ICD-10-CM

## 2022-04-26 DIAGNOSIS — J449 Chronic obstructive pulmonary disease, unspecified: Secondary | ICD-10-CM

## 2022-04-26 DIAGNOSIS — Z803 Family history of malignant neoplasm of breast: Secondary | ICD-10-CM

## 2022-04-26 DIAGNOSIS — Z801 Family history of malignant neoplasm of trachea, bronchus and lung: Secondary | ICD-10-CM

## 2022-04-26 DIAGNOSIS — J3089 Other allergic rhinitis: Secondary | ICD-10-CM

## 2022-04-26 DIAGNOSIS — J4489 Other specified chronic obstructive pulmonary disease: Secondary | ICD-10-CM

## 2022-04-26 DIAGNOSIS — Z800 Family history of malignant neoplasm of digestive organs: Secondary | ICD-10-CM

## 2022-04-26 NOTE — Patient Instructions (Signed)
Thank you for visiting Dr. Tonia Brooms at [facility]. Today we recommend the following:  Continue injections and inhaler regimen See [place] in 1 year Call me if anything changes   Return in about 1 year (around 04/26/2023) for with APP or Dr. Tonia Brooms.    Please do your part to reduce the spread of COVID-19.

## 2022-05-02 DIAGNOSIS — Z23 Encounter for immunization: Secondary | ICD-10-CM | POA: Diagnosis not present

## 2022-05-15 ENCOUNTER — Other Ambulatory Visit: Payer: Self-pay | Admitting: Pulmonary Disease

## 2022-05-17 ENCOUNTER — Other Ambulatory Visit: Payer: Self-pay | Admitting: Pulmonary Disease

## 2022-07-17 ENCOUNTER — Telehealth: Payer: Self-pay | Admitting: Pulmonary Disease

## 2022-07-17 DIAGNOSIS — J4489 Other specified chronic obstructive pulmonary disease: Secondary | ICD-10-CM

## 2022-07-17 NOTE — Telephone Encounter (Signed)
Pharm will not fill her Tezspire without her calling us first. Please send in RX.  Pharm is [facility] @ [phone]

## 2022-07-18 MED ORDER — TEZSPIRE 210 MG/1.91ML ~~LOC~~ SOAJ: 210.0000 mg | SUBCUTANEOUS | 5 refills | Status: DC

## 2022-07-18 NOTE — Telephone Encounter (Signed)
Returned call to [facility] regarding [employer] a Prior Authorization request to [employer] for [employer] via CoverMyMeds. Will update once we receive a response.  Key: [id number] Per automated response; [employer] does not manage prior authorizations for this patient. Please resubmit the ePA request to [facility].    Chesley Mires, PharmD, MPH, BCPS, CPP Clinical Pharmacist (Rheumatology and Pulmonology)

## 2022-07-18 NOTE — Telephone Encounter (Signed)
Submitted a Prior Authorization request to [employer] for TEZSPIRE via [employer]. Will update once we receive a response.  EOC ID: [id number]  Darolyn Rua, PharmD Student [employer]

## 2022-07-18 NOTE — Telephone Encounter (Addendum)
Received notification from  [employer]  regarding a prior authorization for TEZSPIRE. Authorization has been APPROVED from 07/18/22 to 07/17/23. Approval letter sent to scan center.  EOC # [id number] Phone # [phone]  Patient notified via MyChart. Auth approval uploaded onto [facility] portal. [facility] rep able to successfully process claim for $30 for pen.  Chesley Mires, PharmD, MPH, BCPS, CPP Clinical Pharmacist (Rheumatology and Pulmonology)

## 2022-07-26 DIAGNOSIS — Z1231 Encounter for screening mammogram for malignant neoplasm of breast: Secondary | ICD-10-CM | POA: Diagnosis not present

## 2022-08-16 ENCOUNTER — Other Ambulatory Visit: Payer: Self-pay | Admitting: Pulmonary Disease

## 2022-09-09 DIAGNOSIS — Z961 Presence of intraocular lens: Secondary | ICD-10-CM | POA: Diagnosis not present

## 2022-09-27 DIAGNOSIS — I10 Essential (primary) hypertension: Secondary | ICD-10-CM | POA: Diagnosis not present

## 2022-09-27 DIAGNOSIS — E782 Mixed hyperlipidemia: Secondary | ICD-10-CM | POA: Diagnosis not present

## 2022-09-27 DIAGNOSIS — Z13220 Encounter for screening for lipoid disorders: Secondary | ICD-10-CM | POA: Diagnosis not present

## 2022-09-27 DIAGNOSIS — E039 Hypothyroidism, unspecified: Secondary | ICD-10-CM | POA: Diagnosis not present

## 2022-10-02 DIAGNOSIS — Z23 Encounter for immunization: Secondary | ICD-10-CM | POA: Diagnosis not present

## 2022-10-02 DIAGNOSIS — Z0000 Encounter for general adult medical examination without abnormal findings: Secondary | ICD-10-CM | POA: Diagnosis not present

## 2022-10-17 DIAGNOSIS — E782 Mixed hyperlipidemia: Secondary | ICD-10-CM | POA: Diagnosis not present

## 2022-10-17 DIAGNOSIS — E039 Hypothyroidism, unspecified: Secondary | ICD-10-CM | POA: Diagnosis not present

## 2022-10-17 DIAGNOSIS — Z789 Other specified health status: Secondary | ICD-10-CM | POA: Diagnosis not present

## 2022-10-17 DIAGNOSIS — I10 Essential (primary) hypertension: Secondary | ICD-10-CM | POA: Diagnosis not present

## 2022-11-05 NOTE — Telephone Encounter (Signed)
TC

## 2022-11-16 ENCOUNTER — Other Ambulatory Visit: Payer: Self-pay | Admitting: Pulmonary Disease

## 2022-11-22 ENCOUNTER — Other Ambulatory Visit: Payer: Self-pay | Admitting: Pulmonary Disease

## 2022-12-16 ENCOUNTER — Other Ambulatory Visit: Payer: Self-pay | Admitting: Acute Care

## 2022-12-16 DIAGNOSIS — Z122 Encounter for screening for malignant neoplasm of respiratory organs: Secondary | ICD-10-CM

## 2022-12-16 DIAGNOSIS — Z87891 Personal history of nicotine dependence: Secondary | ICD-10-CM

## 2023-01-08 ENCOUNTER — Ambulatory Visit
Admission: RE | Admit: 2023-01-08 | Discharge: 2023-01-08 | Disposition: A | Discharge status: Home / Self Care | Payer: BC Managed Care – PPO | Source: Ambulatory Visit | Attending: Acute Care | Admitting: Acute Care

## 2023-01-08 DIAGNOSIS — Z87891 Personal history of nicotine dependence: Secondary | ICD-10-CM | POA: Diagnosis not present

## 2023-01-08 DIAGNOSIS — Z122 Encounter for screening for malignant neoplasm of respiratory organs: Secondary | ICD-10-CM

## 2023-01-09 DIAGNOSIS — R5383 Other fatigue: Secondary | ICD-10-CM | POA: Diagnosis not present

## 2023-01-09 DIAGNOSIS — E785 Hyperlipidemia, unspecified: Secondary | ICD-10-CM | POA: Diagnosis not present

## 2023-01-09 DIAGNOSIS — E559 Vitamin D deficiency, unspecified: Secondary | ICD-10-CM | POA: Diagnosis not present

## 2023-01-13 DIAGNOSIS — E782 Mixed hyperlipidemia: Secondary | ICD-10-CM | POA: Diagnosis not present

## 2023-01-13 DIAGNOSIS — E559 Vitamin D deficiency, unspecified: Secondary | ICD-10-CM | POA: Diagnosis not present

## 2023-01-13 DIAGNOSIS — E039 Hypothyroidism, unspecified: Secondary | ICD-10-CM | POA: Diagnosis not present

## 2023-01-13 DIAGNOSIS — I10 Essential (primary) hypertension: Secondary | ICD-10-CM | POA: Diagnosis not present

## 2023-01-20 ENCOUNTER — Other Ambulatory Visit: Payer: Self-pay

## 2023-01-20 DIAGNOSIS — Z87891 Personal history of nicotine dependence: Secondary | ICD-10-CM

## 2023-01-20 DIAGNOSIS — Z122 Encounter for screening for malignant neoplasm of respiratory organs: Secondary | ICD-10-CM

## 2023-01-29 DIAGNOSIS — R5383 Other fatigue: Secondary | ICD-10-CM | POA: Diagnosis not present

## 2023-01-29 DIAGNOSIS — I10 Essential (primary) hypertension: Secondary | ICD-10-CM | POA: Diagnosis not present

## 2023-01-29 DIAGNOSIS — J441 Chronic obstructive pulmonary disease with (acute) exacerbation: Secondary | ICD-10-CM | POA: Diagnosis not present

## 2023-02-04 DIAGNOSIS — C50412 Malignant neoplasm of upper-outer quadrant of left female breast: Secondary | ICD-10-CM | POA: Diagnosis not present

## 2023-02-21 ENCOUNTER — Encounter: Payer: Self-pay | Admitting: Pulmonary Disease

## 2023-02-21 DIAGNOSIS — J4489 Other specified chronic obstructive pulmonary disease: Secondary | ICD-10-CM

## 2023-02-24 ENCOUNTER — Other Ambulatory Visit: Payer: Self-pay | Admitting: Pulmonary Disease

## 2023-02-26 NOTE — Telephone Encounter (Signed)
Patient is scheduled to see you on 04/25/23. Okay to send in medications?

## 2023-02-27 NOTE — Telephone Encounter (Signed)
 Yes that fine, patient of Dr. Thelda Finney. Make sure she keeps ov with me in April .

## 2023-02-28 MED ORDER — BREZTRI AEROSPHERE 160-9-4.8 MCG/ACT IN AERO: 2.0000 | INHALATION_SPRAY | Freq: Two times a day (BID) | RESPIRATORY_TRACT | 5 refills | Status: DC

## 2023-02-28 MED ORDER — TEZSPIRE 210 MG/1.91ML ~~LOC~~ SOAJ: 210.0000 mg | SUBCUTANEOUS | 5 refills | Status: DC

## 2023-02-28 MED ORDER — MONTELUKAST SODIUM 10 MG PO TABS: 10.0000 mg | ORAL_TABLET | Freq: Every day | ORAL | 1 refills | Status: DC

## 2023-03-05 DIAGNOSIS — R5383 Other fatigue: Secondary | ICD-10-CM | POA: Diagnosis not present

## 2023-03-10 DIAGNOSIS — E039 Hypothyroidism, unspecified: Secondary | ICD-10-CM | POA: Diagnosis not present

## 2023-03-10 DIAGNOSIS — J441 Chronic obstructive pulmonary disease with (acute) exacerbation: Secondary | ICD-10-CM | POA: Diagnosis not present

## 2023-03-10 DIAGNOSIS — U071 COVID-19: Secondary | ICD-10-CM | POA: Diagnosis not present

## 2023-03-10 DIAGNOSIS — J069 Acute upper respiratory infection, unspecified: Secondary | ICD-10-CM | POA: Diagnosis not present

## 2023-03-12 DIAGNOSIS — U071 COVID-19: Secondary | ICD-10-CM | POA: Diagnosis not present

## 2023-04-16 ENCOUNTER — Inpatient Hospital Stay: Payer: BC Managed Care – PPO | Attending: Adult Health | Admitting: Adult Health

## 2023-04-25 ENCOUNTER — Ambulatory Visit: Payer: BC Managed Care – PPO | Admitting: Adult Health

## 2023-04-25 ENCOUNTER — Encounter: Payer: Self-pay | Admitting: Adult Health

## 2023-04-25 VITALS — BP 130/60 | HR 63 | Temp 98.0°F | Ht 60.0 in | Wt 173.4 lb

## 2023-04-25 DIAGNOSIS — J4489 Other specified chronic obstructive pulmonary disease: Secondary | ICD-10-CM | POA: Diagnosis not present

## 2023-04-25 DIAGNOSIS — R918 Other nonspecific abnormal finding of lung field: Secondary | ICD-10-CM | POA: Diagnosis not present

## 2023-04-25 DIAGNOSIS — Z87891 Personal history of nicotine dependence: Secondary | ICD-10-CM

## 2023-04-25 DIAGNOSIS — J45909 Unspecified asthma, uncomplicated: Secondary | ICD-10-CM | POA: Diagnosis not present

## 2023-04-25 NOTE — Patient Instructions (Addendum)
 Continue on Breztri 2 puffs Twice daily, rinse after use.  Albuterol inhaler or neb As needed   Continue on Tezspire monthly.  Continue on Allegra, Singulair and Flonase  Activity as tolerated.  Follow up with Dr. Francine Graven or Adriena Manfre NP in 6 months with PFT

## 2023-04-25 NOTE — Progress Notes (Signed)
 @Patient  ID: Dorothy Frederick, female    DOB: 1955-02-23, 69 y.o.   MRN: [id number]  Chief Complaint  Patient presents with   Follow-up    Referring provider: Josephine Igo, DO  HPI: 68 year old female former smoker for COPD with asthma overlap syndrome and allergic rhinitis and Lung nodules.  Medical history significant for breast cancer  Participates in the Lung Cancer CT chest screening program    TEST/EVENTS :  Chest Imaging: 09/08/2020 CT chest: Right middle lobe nodular opacity, severe to moderate emphysema throughout the lung, centrilobular.    October CT chest: 2022: Right middle lobe nodular opacity resolved in comparison to previous images.   Severe centrilobular emphysema.    CT chest 01/04/2022 lung cancer screening CT: Lung RADS 2 benign behavior appearance, originally had a small nodule in the right middle lobe.  CT chest January 08, 2023 showed lung RADS 2 benign appearance, emphysema mild subpleural fibrotic changes in the left upper lobe, unchanged small scattered lung nodules  PFT 12/11/2020 FEV1 68%, ratio 60 , FVC 86%, DLCO 39%.   04/25/2023 Follow up : COPD w/ Asthma , Lung nodules, Allergic rhinitis  Discussed the use of AI scribe software for clinical note transcription with the patient, who gave verbal consent to proceed.  History of Present Illness   Dorothy Frederick is a 68 year old female with COPD and asthma who presents for a 1 year follow up.  She has a history of COPD with asthma overlap  managed with [facility]. She has experienced significant improvement in her breathing since starting Tezspire monthly. She uses Breztri, two puffs in the morning and evening, and ensures to rinse her mouth after use. Albuterol is used primarily for rescue during flare-ups, although she mentions it is not very effective. She possesses a nebulizer from a previous COVID infection but does not use it regularly. Overall is doing well, continues to work full time  in [employer]. Walks 1 mile most days.   She has small scattered lung nodules. Monitored through annual CT scans as part of a lung cancer screening program. The most recent CT scan in December showed stable emphysema and COPD with no changes in the lung nodules.  Regarding vaccinations, she has received flu shots, COVID vaccines, and pneumonia vaccines. She has not received the RSV vaccine but is interested in getting it in the fall.     Has a chronic raspy voice that has been present for years. No unintentional weight loss or pain. No dysphagia      Allergies  Allergen Reactions   Benadryl [Diphenhydramine Hcl (Sleep)] Palpitations    Increased heartrate   Sulfa Antibiotics Rash    Immunization History  Administered Date(s) Administered   Influenza,inj,Quad PF,6+ Mos 11/05/2018   Influenza-Unspecified 10/21/2017, 10/11/2021   PFIZER Comirnaty(Gray Top)Covid-19 Tri-Sucrose Vaccine 10/22/2022   PFIZER(Purple Top)SARS-COV-2 Vaccination 11/03/2019, 05/15/2020    Past Medical History:  Diagnosis Date   Angina at rest [facility])    Anxiety    Cancer (HCC) 2000   left breast cancer-partial mastectomy   COPD (chronic obstructive pulmonary disease) (HCC)    Depression    Family history of breast cancer    Family history of esophageal cancer    Family history of lung cancer    Family history of prostate cancer    GERD (gastroesophageal reflux disease)    Hyperlipidemia    Hypertension    Hypothyroidism    Recurrent breast cancer, left (HCC)     Tobacco History: Social  History   Tobacco Use  Smoking Status Former   Current packs/day: 0.00   Average packs/day: 1.5 packs/day for 42.0 years (63.0 ttl pk-yrs)   Types: Cigarettes   Start date: 89   Quit date: 01/21/2013   Years since quitting: 10.2  Smokeless Tobacco Never   Counseling given: Not Answered   Outpatient Medications Prior to Visit  Medication Sig Dispense Refill   albuterol (PROVENTIL HFA;VENTOLIN HFA) 108 (90  BASE) MCG/ACT inhaler Inhale 2 puffs into the lungs every 6 (six) hours as needed for wheezing or shortness of breath.     atorvastatin (LIPITOR) 40 MG tablet Take 40 mg by mouth daily.     Budeson-Glycopyrrol-Formoterol (BREZTRI AEROSPHERE) 160-9-4.8 MCG/ACT AERO Inhale 2 puffs into the lungs 2 (two) times daily. 10.7 g 5   Cholecalciferol 125 MCG (5000 UT) TABS Take 5,000 Units by mouth daily.     escitalopram (LEXAPRO) 10 MG tablet Take 10 mg by mouth daily.  3   famotidine (PEPCID) 10 MG tablet Take 10 mg by mouth at bedtime.     fexofenadine (ALLEGRA) 180 MG tablet TAKE 1 TABLET BY MOUTH EVERY DAY 30 tablet 5   fluticasone (FLONASE) 50 MCG/ACT nasal spray SPRAY 2 SPRAYS INTO EACH NOSTRIL EVERY DAY 48 mL 2   ibuprofen (ADVIL) 200 MG tablet Take 400 mg by mouth every 6 (six) hours as needed for headache or moderate pain.     levothyroxine (SYNTHROID) 75 MCG tablet Take 75 mcg by mouth See admin instructions. Alternate every other day with 50 mcg     montelukast (SINGULAIR) 10 MG tablet Take 1 tablet (10 mg total) by mouth at bedtime. 90 tablet 1   Multiple Vitamins-Minerals (WOMENS 50+ MULTI VITAMIN/MIN PO)      Tezepelumab-ekko (TEZSPIRE) 210 MG/1. SOAJ Inject 210 mg into the skin every 28 (twenty-eight) days. 1.91 mL 5   levothyroxine (SYNTHROID, LEVOTHROID) 50 MCG tablet Take 1 tablet by mouth every other day. Alternate every other day  with 75 mcg     olmesartan (BENICAR) 20 MG tablet Take 20 mg by mouth daily.     No facility-administered medications prior to visit.     Review of Systems:   Constitutional:   No  weight loss, night sweats,  Fevers, chills, fatigue, or  lassitude.  HEENT:   No headaches,  Difficulty swallowing,  Tooth/dental problems, or  Sore throat,                No sneezing, itching, ear ache,+ nasal congestion, post nasal drip,   CV:  No chest pain,  Orthopnea, PND, swelling in lower extremities, anasarca, dizziness, palpitations, syncope.   GI  No  heartburn, indigestion, abdominal pain, nausea, vomiting, diarrhea, change in bowel habits, loss of appetite, bloody stools.   Resp:   No excess mucus, no productive cough,  No non-productive cough,  No coughing up of blood.  No change in color of mucus.  No wheezing.  No chest wall deformity  Skin: no rash or lesions.  GU: no dysuria, change in color of urine, no urgency or frequency.  No flank pain, no hematuria   MS:  No joint pain or swelling.  No decreased range of motion.  No back pain.    Physical Exam  BP 130/60 (BP Location: Left Arm, Patient Position: Sitting, Cuff Size: Normal)   Pulse 63   Temp 98 F (36.7 C) (Oral)   Ht 5' (1.524 m)   Wt 173 lb 6.4 oz (78.7 kg)  SpO2 95%   BMI 33.86 kg/m   GEN: A/Ox3; pleasant , NAD, well nourished    HEENT:  [place]/AT, NOSE-clear, THROAT-clear, no lesions, no postnasal drip or exudate noted.   NECK:  Supple w/ fair ROM; no JVD; normal carotid impulses w/o bruits; no thyromegaly or nodules palpated; no lymphadenopathy.    RESP  Clear  P & A; w/o, wheezes/ rales/ or rhonchi. no accessory muscle use, no dullness to percussion  CARD:  RRR, no m/r/g, no peripheral edema, pulses intact, no cyanosis or clubbing.  GI:   Soft & nt; nml bowel sounds; no organomegaly or masses detected.   Musco: Warm bil, no deformities or joint swelling noted.   Neuro: alert, no focal deficits noted.    Skin: Warm, no lesions or rashes    Lab Results:  CBC    Component Value Date/Time   WBC 5.6 11/15/2021 1328   RBC 4.33 11/15/2021 1328   HGB 12.4 11/15/2021 1328   HGB 12.3 07/22/2018 0835   HGB 14.1 09/08/2006 1500   HCT 38.2 11/15/2021 1328   HCT 39.7 09/08/2006 1500   PLT 268.0 11/15/2021 1328   PLT 266 07/22/2018 0835   PLT 240 09/08/2006 1500   MCV 88.3 11/15/2021 1328   MCV 87.9 09/08/2006 1500   MCH 28.4 03/30/2021 0822   MCHC 32.4 11/15/2021 1328   RDW 15.4 11/15/2021 1328   RDW 12.0 09/08/2006 1500   LYMPHSABS 1.1 11/15/2021  1328   LYMPHSABS 1.8 09/08/2006 1500   MONOABS 0.6 11/15/2021 1328   MONOABS 0.5 09/08/2006 1500   EOSABS 0.1 11/15/2021 1328   EOSABS 0.1 09/08/2006 1500   BASOSABS 0.0 11/15/2021 1328   BASOSABS 0.1 09/08/2006 1500    BMET    Component Value Date/Time   NA 141 03/30/2021 0822   K 3.8 03/30/2021 0822   CL 108 03/30/2021 0822   CO2 26 03/30/2021 0822   GLUCOSE 127 (H) 03/30/2021 0822   BUN 17 03/30/2021 0822   CREATININE 1.09 (H) 03/30/2021 0822   CREATININE 1.11 (H) 07/22/2018 0835   CREATININE 0.97 10/18/2013 1527   CALCIUM 9.6 03/30/2021 0822   GFRNONAA 56 (L) 03/30/2021 0822   GFRNONAA 53 (L) 07/22/2018 0835   GFRAA >60 10/18/2019 1100   GFRAA >60 07/22/2018 0835    BNP No results found for: "BNP"  ProBNP No results found for: "PROBNP"  Imaging: No results found.  Administration History     None          Latest Ref Rng & Units 12/11/2020    8:58 AM  PFT Results  FVC-Pre L 2.65   FVC-Predicted Pre % 85   FVC-Post L 2.69   FVC-Predicted Post % 86   Pre FEV1/FVC % % 59   Post FEV1/FCV % % 60   FEV1-Pre L 1.55   FEV1-Predicted Pre % 65   FEV1-Post L 1.62   DLCO uncorrected ml/min/mmHg 7.62   DLCO UNC% % 39   DLCO corrected ml/min/mmHg 7.62   DLCO COR %Predicted % 39   DLVA Predicted % 46   TLC L 5.01   TLC % Predicted % 100   RV % Predicted % 104     No results found for: "NITRICOXIDE"      Assessment & Plan:   Assessment and Plan    Chronic Obstructive Pulmonary Disease (COPD) with Asthma   COPD and asthma are well-managed with Markus Daft , Singulair and Tezspire for  maintenance   The last pulmonary function test  in 2022 indicated moderate stage 2 COPD with FEV1 at 68% . Has moderate  Emphysema Continue Breztri, two puffs in the morning and evening, and rinse mouth after use. Continue Tezspire injections every 28 days. Use albuterol inhaler or neb  as needed for rescue during exacerbations. . Order pulmonary function test in six months.  Follow up in six months for a check-up and review of pulmonary function test results.  Lung Nodules   Lung nodules are stable with no changes on the last CT scan in December. She is part of a lung cancer screening program and receives annual CT scans. Continue annual CT scans as part of the lung cancer screening program.  Allergic Rhinitis   She experiences allergy symptoms, particularly during high pollen seasons, and manages them with Singulair, Allegra, and Flonase. She uses Allegra daily, Singulair at night, and Flonase during high pollen seasons. She is advised to minimize pollen exposure by showering and changing bedding frequently. Continue Singulair, Allegra, and Flonase as currently prescribed. Advise on minimizing pollen exposure by showering and changing bedding frequently.  Raspy Voice   Persistent raspy voice present for years.  Discussed referral to ENT for evaluation, declined at this time.   Breast Cancer   She reports doing well post-treatment and is scheduled for a mammogram in June. Keep follow up with Oncology    Vaccinations   She is up to date on influenza and COVID-19 vaccinations and has received pneumonia and shingles vaccines. She is interested in the RSV vaccine in the fall           Rubye Oaks, NP 04/25/2023

## 2023-05-18 ENCOUNTER — Ambulatory Visit (HOSPITAL_COMMUNITY): Admission: EM | Admit: 2023-05-18 | Discharge: 2023-05-18 | Disposition: A | Discharge status: Home / Self Care

## 2023-05-18 ENCOUNTER — Other Ambulatory Visit: Payer: Self-pay

## 2023-05-18 ENCOUNTER — Encounter (HOSPITAL_COMMUNITY): Payer: Self-pay

## 2023-05-18 DIAGNOSIS — S81811A Laceration without foreign body, right lower leg, initial encounter: Secondary | ICD-10-CM

## 2023-05-18 MED ORDER — LIDOCAINE HCL (PF) 1 % IJ SOLN
INTRAMUSCULAR | Status: AC
  Filled 2023-05-18: qty 2

## 2023-05-18 NOTE — ED Triage Notes (Signed)
 Pt reports she was cutting florist cubes with an "exacto knife" and the knife spilled and hit her right leg. Pt reports that the wound did not bleed badly but its deep. Pt reports a pain level of 2/10. Pt states that she cleaned it with warm soapy water before arrival.

## 2023-05-18 NOTE — Discharge Instructions (Addendum)
  1. Laceration of right lower extremity, initial encounter (Primary) - Laceration Repair completed in UC by provider, 3 sutures placed with close approximation, no bleeding following repair. - Apply dressing in urgent care with bacitracin  ointment, no bleeding noted after application of bacitracin  and direct pressure. - Return to urgent care in 7 days for suture removal. - Monitor for any signs of secondary infection such as increased redness, warmth, swelling, drainage, fever.

## 2023-05-18 NOTE — ED Provider Notes (Signed)
 [facility]  Note:  This document was prepared using Dragon voice recognition software and may include unintentional dictation errors.  MRN: [id number] DOB: 1955-01-30  Subjective:   Dorothy Frederick is a 68 y.o. female presenting for right upper leg laceration that occurred approximately an hour prior to treatment in urgent care.  Patient reports that she dropped an X-Acto knife on her right upper leg causing a deep laceration approximately 1 cm in length.  Patient reports mild bleeding but bleeding well-controlled at this time.  Patient was able to clean wound with soap and water immediately after injury occurred.  Patient denies any other secondary injury or concern.  No current facility-administered medications for this encounter.  Current Outpatient Medications:    albuterol  (PROVENTIL  HFA;VENTOLIN  HFA) 108 (90 BASE) MCG/ACT inhaler, Inhale 2 puffs into the lungs every 6 (six) hours as needed for wheezing or shortness of breath., Disp: , Rfl:    atorvastatin  (LIPITOR) 40 MG tablet, Take 40 mg by mouth daily., Disp: , Rfl:    Budeson-Glycopyrrol-Formoterol (BREZTRI  AEROSPHERE) 160-9-4.8 MCG/ACT AERO, Inhale 2 puffs into the lungs 2 (two) times daily., Disp: 10.7 g, Rfl: 5   Cholecalciferol  125 MCG (5000 UT) TABS, Take 5,000 Units by mouth daily., Disp: , Rfl:    escitalopram  (LEXAPRO ) 10 MG tablet, Take 10 mg by mouth daily., Disp: , Rfl: 3   famotidine  (PEPCID ) 10 MG tablet, Take 10 mg by mouth at bedtime., Disp: , Rfl:    fexofenadine  (ALLEGRA ) 180 MG tablet, TAKE 1 TABLET BY MOUTH EVERY DAY, Disp: 30 tablet, Rfl: 5   fluticasone  (FLONASE ) 50 MCG/ACT nasal spray, SPRAY 2 SPRAYS INTO EACH NOSTRIL EVERY DAY, Disp: 48 mL, Rfl: 2   ibuprofen (ADVIL) 200 MG tablet, Take 400 mg by mouth every 6 (six) hours as needed for headache or moderate pain., Disp: , Rfl:    levothyroxine  (SYNTHROID ) 75 MCG tablet, Take 75 mcg by mouth See admin instructions. Alternate every other day with 50  mcg, Disp: , Rfl:    montelukast  (SINGULAIR ) 10 MG tablet, Take 1 tablet (10 mg total) by mouth at bedtime., Disp: 90 tablet, Rfl: 1   Multiple Vitamins-Minerals (WOMENS 50+ MULTI VITAMIN/MIN PO), , Disp: , Rfl:    Tezepelumab -ekko (TEZSPIRE ) 210 MG/1. SOAJ, Inject 210 mg into the skin every 28 (twenty-eight) days., Disp: 1.91 mL, Rfl: 5   Allergies  Allergen Reactions   Benadryl  [Diphenhydramine  Hcl (Sleep)] Palpitations    Increased heartrate   Sulfa Antibiotics Rash    Past Medical History:  Diagnosis Date   Angina at rest [facility])    Anxiety    Cancer (HCC) 2000   left breast cancer-partial mastectomy   COPD (chronic obstructive pulmonary disease) (HCC)    Depression    Family history of breast cancer    Family history of esophageal cancer    Family history of lung cancer    Family history of prostate cancer    GERD (gastroesophageal reflux disease)    Hyperlipidemia    Hypertension    Hypothyroidism    Recurrent breast cancer, left [facility])      Past Surgical History:  Procedure Laterality Date   ADENOIDECTOMY     APPLICATION OF WOUND VAC Left 04/01/2019   Procedure: Placement of provena wound vac;  Surgeon: Thornell Flirt, DO;  Location: [facility];  Service: Plastics;  Laterality: Left;   CARDIAC CATHETERIZATION     DEBRIDEMENT AND CLOSURE WOUND Left 04/01/2019   Procedure: Left breast wound excision  and closure;  Surgeon: Thornell Flirt, DO;  Location: [facility];  Service: Plastics;  Laterality: Left;   LEFT HEART CATHETERIZATION WITH CORONARY ANGIOGRAM N/A 10/21/2013   Procedure: LEFT HEART CATHETERIZATION WITH CORONARY ANGIOGRAM;  Surgeon: Odie Benne, MD;  Location: [facility] LAB;  Service: Cardiovascular;  Laterality: N/A;   MASTECTOMY W/ SENTINEL NODE BIOPSY Left 09/16/2018   Procedure: LEFT TOTAL MASTECTOMY WITH LEFT AXILLARY DEEP SENTINEL LYMPH NODE BIOPSY AND INJECT BLUE DYE;  Surgeon: Boyce Byes, MD;   Location: [facility];  Service: General;  Laterality: Left;   MASTECTOMY, PARTIAL Left 2000   PORT-A-CATH REMOVAL Right 10/20/2019   Procedure: REMOVAL PORT-A-CATH;  Surgeon: Oza Blumenthal, MD;  Location: [facility];  Service: General;  Laterality: Right;   PORTACATH PLACEMENT Right 09/16/2018   Procedure: INSERTION PORT-A-CATH;  Surgeon: Boyce Byes, MD;  Location: [facility];  Service: General;  Laterality: Right;   TONSILLECTOMY      Family History  Problem Relation Age of Onset   Heart disease Mother    Hypertension Father    Cancer Father    Prostate cancer Father    Hypertension Sister    Breast cancer Sister        dx 56s   Heart disease Sister    Hypertension Brother    Hyperlipidemia Maternal Grandmother    Stroke Maternal Grandmother    Hypertension Brother    Breast cancer Maternal Aunt    Lung cancer Maternal Aunt    Esophageal cancer Maternal Aunt    Stomach cancer Paternal Grandmother     Social History   Tobacco Use   Smoking status: Former    Current packs/day: 0.00    Average packs/day: 1.5 packs/day for 42.0 years (63.0 ttl pk-yrs)    Types: Cigarettes    Start date: 61    Quit date: 01/21/2013    Years since quitting: 10.3   Smokeless tobacco: Never  Substance Use Topics   Alcohol use: No   Drug use: No    ROS Refer to HPI for ROS details.  Objective:   Vitals: BP (!) 148/84 (BP Location: Left Arm)   Pulse 72   Temp 98.2 F (36.8 C) (Oral)   SpO2 97%   Physical Exam Vitals and nursing note reviewed.  Constitutional:      General: She is not in acute distress.    Appearance: Normal appearance. She is well-developed. She is not ill-appearing or toxic-appearing.  HENT:     Head: Normocephalic and atraumatic.  Cardiovascular:     Rate and Rhythm: Normal rate.  Pulmonary:     Effort: Pulmonary effort is normal. No respiratory distress.  Musculoskeletal:        General: Normal range of  motion.  Skin:    General: Skin is warm and dry.     Findings: Laceration (Right upper leg linear laceration approximately 1.2 cm in length and 3 mm in depth, no surrounding erythema, mild bleeding, no surrounding swelling no secondary signs of infection.) present.  Neurological:     General: No focal deficit present.     Mental Status: She is alert and oriented to person, place, and time.  Psychiatric:        Mood and Affect: Mood normal.        Behavior: Behavior normal.     Laceration Repair  Date/Time: 05/18/2023 1:08 PM  Performed by: Alease Hunter, NP Authorized by: Alease Hunter, NP  Consent:    Consent obtained:  Verbal   Consent given by:  Patient Universal protocol:    Patient identity confirmed:  Verbally with patient and arm band Anesthesia:    Anesthesia method:  Local infiltration   Local anesthetic:  Lidocaine  1% w/o epi Laceration details:    Location:  Leg   Leg location:  R upper leg   Length (cm):  1.2   Depth (mm):  3 Pre-procedure details:    Preparation:  Patient was prepped and draped in usual sterile fashion Treatment:    Area cleansed with:  Soap and water   Amount of cleaning:  Standard   Visualized foreign bodies/material removed: no     Debridement:  None Skin repair:    Repair method:  Sutures   Suture size:  4-0   Suture material:  Prolene   Suture technique:  Simple interrupted Approximation:    Approximation:  Close Repair type:    Repair type:  Simple Post-procedure details:    Dressing:  Antibiotic ointment   Procedure completion:  Tolerated   No results found for this or any previous visit (from the past 24 hours).  No results found.   Assessment and Plan :     Discharge Instructions       1. Laceration of right lower extremity, initial encounter (Primary) - Laceration Repair completed in UC by provider, 3 sutures placed with close approximation, no bleeding following repair. - Apply dressing in urgent  care with bacitracin  ointment, no bleeding noted after application of bacitracin  and direct pressure. - Return to urgent care in 7 days for suture removal. - Monitor for any signs of secondary infection such as increased redness, warmth, swelling, drainage, fever.      Romell Cavanah B Albert Devaul   Oseias Horsey, Nellie B, [place] 05/18/23 1317

## 2023-05-25 ENCOUNTER — Ambulatory Visit (HOSPITAL_COMMUNITY)
Admission: RE | Admit: 2023-05-25 | Discharge: 2023-05-25 | Disposition: A | Discharge status: Home / Self Care | Source: Ambulatory Visit | Attending: Emergency Medicine | Admitting: Emergency Medicine

## 2023-05-25 ENCOUNTER — Encounter (HOSPITAL_COMMUNITY): Payer: Self-pay

## 2023-05-25 VITALS — BP 120/75 | HR 68 | Temp 97.8°F | Resp 16

## 2023-05-25 DIAGNOSIS — S71111D Laceration without foreign body, right thigh, subsequent encounter: Secondary | ICD-10-CM | POA: Diagnosis not present

## 2023-05-25 DIAGNOSIS — Z5189 Encounter for other specified aftercare: Secondary | ICD-10-CM

## 2023-05-25 DIAGNOSIS — Z4802 Encounter for removal of sutures: Secondary | ICD-10-CM

## 2023-05-25 MED ORDER — DOXYCYCLINE HYCLATE 100 MG PO CAPS: 100.0000 mg | ORAL_CAPSULE | Freq: Two times a day (BID) | ORAL | 0 refills | Status: AC

## 2023-05-25 NOTE — Discharge Instructions (Signed)
 Doxycycline twice daily for 5 days in a row Always take with food to avoid upset stomach! Please return if you notice increasing redness, swelling, pain, or drainage

## 2023-05-25 NOTE — ED Triage Notes (Signed)
 Patient here today for a suture removal on anterior right upper leg that she had place on 05/18/2023. Patient states that she does not have any pain but there is some redness around the area.

## 2023-05-25 NOTE — ED Provider Notes (Signed)
 [facility]    CSN: [id number] Arrival date & time: 05/25/23  1116      History   Chief Complaint Chief Complaint  Patient presents with   Suture / Staple Removal    Entered by patient    HPI Dorothy Frederick is a 68 y.o. female.  Initially presented for suture removal. Was seen 4/27 after cut right thigh with an X-Acto knife.  Area was closed with 3 sutures. She reports redness around the area.  Not tender or painful. Using neosporin   Past Medical History:  Diagnosis Date   Angina at rest [facility])    Anxiety    Cancer (HCC) 2000   left breast cancer-partial mastectomy   COPD (chronic obstructive pulmonary disease) (HCC)    Depression    Family history of breast cancer    Family history of esophageal cancer    Family history of lung cancer    Family history of prostate cancer    GERD (gastroesophageal reflux disease)    Hyperlipidemia    Hypertension    Hypothyroidism    Recurrent breast cancer, left [facility])     Patient Active Problem List   Diagnosis Date Noted   Asthma-COPD overlap syndrome (HCC) 12/14/2021   Allergic rhinitis 12/14/2021   Upper airway cough syndrome 12/14/2021   Acquired absence of left breast 10/15/2019   Genetic testing 07/30/2018   Family history of breast cancer    Family history of prostate cancer    Family history of lung cancer    Family history of esophageal cancer    Malignant neoplasm of upper-outer quadrant of left breast in female, estrogen receptor negative (HCC) 07/15/2018   CAD due to calcified coronary lesion 07/30/2014   HTN (hypertension) 11/08/2013   Abnormal nuclear stress test 10/16/2013   Abnormal ECG 09/15/2013   Hypercholesterolemia 09/15/2013   History of tobacco use 09/15/2013   Exertional dyspnea 09/15/2013    Past Surgical History:  Procedure Laterality Date   ADENOIDECTOMY     APPLICATION OF WOUND VAC Left 04/01/2019   Procedure: Placement of provena wound vac;  Surgeon: Thornell Flirt, DO;   Location: [facility];  Service: Plastics;  Laterality: Left;   CARDIAC CATHETERIZATION     DEBRIDEMENT AND CLOSURE WOUND Left 04/01/2019   Procedure: Left breast wound excision and closure;  Surgeon: Thornell Flirt, DO;  Location: [facility];  Service: Plastics;  Laterality: Left;   LEFT HEART CATHETERIZATION WITH CORONARY ANGIOGRAM N/A 10/21/2013   Procedure: LEFT HEART CATHETERIZATION WITH CORONARY ANGIOGRAM;  Surgeon: Odie Benne, MD;  Location: [facility] CATH LAB;  Service: Cardiovascular;  Laterality: N/A;   MASTECTOMY W/ SENTINEL NODE BIOPSY Left 09/16/2018   Procedure: LEFT TOTAL MASTECTOMY WITH LEFT AXILLARY DEEP SENTINEL LYMPH NODE BIOPSY AND INJECT BLUE DYE;  Surgeon: Boyce Byes, MD;  Location: [facility];  Service: General;  Laterality: Left;   MASTECTOMY, PARTIAL Left 2000   PORT-A-CATH REMOVAL Right 10/20/2019   Procedure: REMOVAL PORT-A-CATH;  Surgeon: Oza Blumenthal, MD;  Location: [facility];  Service: General;  Laterality: Right;   PORTACATH PLACEMENT Right 09/16/2018   Procedure: INSERTION PORT-A-CATH;  Surgeon: Boyce Byes, MD;  Location: [facility];  Service: General;  Laterality: Right;   TONSILLECTOMY      OB History   No obstetric history on file.      Home Medications    Prior to Admission medications   Medication Sig Start Date End Date  Taking? Authorizing Provider  doxycycline (VIBRAMYCIN) 100 MG capsule Take 1 capsule (100 mg total) by mouth 2 (two) times daily for 5 days. 05/25/23 05/30/23 Yes Harue Pribble, Ivette Marks, PA-C  albuterol  (PROVENTIL  HFA;VENTOLIN  HFA) 108 (90 BASE) MCG/ACT inhaler Inhale 2 puffs into the lungs every 6 (six) hours as needed for wheezing or shortness of breath.    [provider]  atorvastatin  (LIPITOR) 40 MG tablet Take 40 mg by mouth daily. 10/31/20   [provider]  Budeson-Glycopyrrol-Formoterol (BREZTRI  AEROSPHERE) 160-9-4.8 MCG/ACT  AERO Inhale 2 puffs into the lungs 2 (two) times daily. 02/28/23   Parrett, Macdonald Savoy, NP  Cholecalciferol  125 MCG (5000 UT) TABS Take 5,000 Units by mouth daily.    [provider]  escitalopram  (LEXAPRO ) 10 MG tablet Take 10 mg by mouth daily. 09/29/17   [provider]  famotidine  (PEPCID ) 10 MG tablet Take 10 mg by mouth at bedtime.    [provider]  fexofenadine  (ALLEGRA ) 180 MG tablet TAKE 1 TABLET BY MOUTH EVERY DAY 02/25/23   Cobb, Katherine V, NP  fluticasone  (FLONASE ) 50 MCG/ACT nasal spray SPRAY 2 SPRAYS INTO EACH NOSTRIL EVERY DAY 08/16/22   Icard, Dariel Edelson L, DO  ibuprofen (ADVIL) 200 MG tablet Take 400 mg by mouth every 6 (six) hours as needed for headache or moderate pain.    [provider]  levothyroxine  (SYNTHROID ) 75 MCG tablet Take 75 mcg by mouth See admin instructions. Alternate every other day with 50 mcg 10/19/19   [provider]  montelukast  (SINGULAIR ) 10 MG tablet Take 1 tablet (10 mg total) by mouth at bedtime. 02/28/23   Parrett, Macdonald Savoy, NP  Multiple Vitamins-Minerals (WOMENS 50+ MULTI VITAMIN/MIN PO)     [provider]  Tezepelumab -ekko (TEZSPIRE ) 210 MG/1. SOAJ Inject 210 mg into the skin every 28 (twenty-eight) days. 02/28/23   Parrett, Macdonald Savoy, NP    Family History Family History  Problem Relation Age of Onset   Heart disease Mother    Hypertension Father    Cancer Father    Prostate cancer Father    Hypertension Sister    Breast cancer Sister        dx 76s   Heart disease Sister    Hypertension Brother    Hyperlipidemia Maternal Grandmother    Stroke Maternal Grandmother    Hypertension Brother    Breast cancer Maternal Aunt    Lung cancer Maternal Aunt    Esophageal cancer Maternal Aunt    Stomach cancer Paternal Grandmother     Social History Social History   Tobacco Use   Smoking status: Former    Current packs/day: 0.00    Average packs/day: 1.5 packs/day for 42.0 years (63.0 ttl pk-yrs)     Types: Cigarettes    Start date: 46    Quit date: 01/21/2013    Years since quitting: 10.3   Smokeless tobacco: Never  Substance Use Topics   Alcohol use: No   Drug use: No     Allergies   Benadryl  [diphenhydramine  hcl (sleep)] and Sulfa antibiotics   Review of Systems Review of Systems Per HPI  Physical Exam Triage Vital Signs ED Triage Vitals  Encounter Vitals Group     BP 05/25/23 1148 120/75     Systolic BP Percentile --      Diastolic BP Percentile --      Pulse Rate 05/25/23 1148 68     Resp 05/25/23 1148 16     Temp 05/25/23 1148 97.8 F (36.6  C)     Temp Source 05/25/23 1148 Oral     SpO2 05/25/23 1148 96 %     Weight --      Height --      Head Circumference --      Peak Flow --      Pain Score 05/25/23 1146 0     Pain Loc --      Pain Education --      Exclude from Growth Chart --    No data found.  Updated Vital Signs BP 120/75 (BP Location: Right Arm)   Pulse 68   Temp 97.8 F (36.6 C) (Oral)   Resp 16   SpO2 96%   Visual Acuity Right Eye Distance:   Left Eye Distance:   Bilateral Distance:    Right Eye Near:   Left Eye Near:    Bilateral Near:     Physical Exam Vitals and nursing note reviewed.  Constitutional:      General: She is not in acute distress.    Appearance: Normal appearance.  HENT:     Mouth/Throat:     Mouth: Mucous membranes are moist.     Pharynx: Oropharynx is clear.  Cardiovascular:     Rate and Rhythm: Normal rate and regular rhythm.     Heart sounds: Normal heart sounds.  Pulmonary:     Effort: Pulmonary effort is normal.     Breath sounds: Normal breath sounds.  Skin:    Findings: Erythema present.          Comments: Right thigh with circular area of erythema surrounding closed, healing laceration. 3 sutures in place. There is firm induration about 1 cm in diameter. No warmth or tenderness  Neurological:     Mental Status: She is alert and oriented to person, place, and time.     UC Treatments /  Results  Labs (all labs ordered are listed, but only abnormal results are displayed) Labs Reviewed - No data to display  EKG   Radiology No results found.  Procedures Procedures (including critical care time)  Medications Ordered in UC Medications - No data to display  Initial Impression / Assessment and Plan / UC Course  I have reviewed the triage vital signs and the nursing notes.  Pertinent labs & imaging results that were available during my care of the patient were reviewed by me and considered in my medical decision making (see chart for details).  Erythema and area of induration beneath healing laceration Cover for infection with doxycycline BID x 5 days 3 sutures are removed by CMA Advised reasons to return to clinic.  Patient agrees to plan, no questions  Final Clinical Impressions(s) / UC Diagnoses   Final diagnoses:  Visit for suture removal  Visit for wound check  Laceration of right thigh, subsequent encounter     Discharge Instructions      Doxycycline twice daily for 5 days in a row Always take with food to avoid upset stomach! Please return if you notice increasing redness, swelling, pain, or drainage     ED Prescriptions     Medication Sig Dispense Auth. Provider   doxycycline (VIBRAMYCIN) 100 MG capsule Take 1 capsule (100 mg total) by mouth 2 (two) times daily for 5 days. 10 capsule Tobin Witucki, Ivette Marks, PA-C      PDMP not reviewed this encounter.   Devian Bartolomei, Ivette Marks, [place] 05/25/23 1206

## 2023-06-04 DIAGNOSIS — R5383 Other fatigue: Secondary | ICD-10-CM | POA: Diagnosis not present

## 2023-06-10 DIAGNOSIS — F411 Generalized anxiety disorder: Secondary | ICD-10-CM | POA: Diagnosis not present

## 2023-06-10 DIAGNOSIS — G4700 Insomnia, unspecified: Secondary | ICD-10-CM | POA: Diagnosis not present

## 2023-06-10 DIAGNOSIS — I10 Essential (primary) hypertension: Secondary | ICD-10-CM | POA: Diagnosis not present

## 2023-06-10 DIAGNOSIS — E039 Hypothyroidism, unspecified: Secondary | ICD-10-CM | POA: Diagnosis not present

## 2023-07-15 ENCOUNTER — Other Ambulatory Visit: Payer: Self-pay | Admitting: Adult Health

## 2023-07-15 DIAGNOSIS — J4489 Other specified chronic obstructive pulmonary disease: Secondary | ICD-10-CM

## 2023-08-01 DIAGNOSIS — Z1231 Encounter for screening mammogram for malignant neoplasm of breast: Secondary | ICD-10-CM | POA: Diagnosis not present

## 2023-08-05 ENCOUNTER — Telehealth (HOSPITAL_BASED_OUTPATIENT_CLINIC_OR_DEPARTMENT_OTHER): Payer: Self-pay

## 2023-08-05 NOTE — Telephone Encounter (Unsigned)
 Copied from CRM [id number]. Topic: Clinical - Prescription Issue >> Aug 04, 2023  5:42 PM Rilla B wrote: Caller [facility] staing Prior Authorization is needed for Tezspire  (Tezepulumab) 210 mg. Please call patient's insurance plan to initiate prior auth. @ [phone]  Any questions please call [phone] option 3  Reference #[id number]

## 2023-08-06 ENCOUNTER — Telehealth: Payer: Self-pay

## 2023-08-06 NOTE — Telephone Encounter (Signed)
 Received notification from [facility] regarding a prior authorization for TEZSPIRE . Authorization has been APPROVED from 08/06/2023 to 08/04/2024. Approval letter sent to scan center.  Authorization # [id number]  Faxed approval letter to [facility]  Sherry Pennant, PharmD, MPH, BCPS, CPP Clinical Pharmacist (Rheumatology and Pulmonology)

## 2023-08-06 NOTE — Telephone Encounter (Signed)
 PA submitted in new phone encounter

## 2023-08-06 NOTE — Telephone Encounter (Signed)
 Submitted an URGENT Prior Authorization RENEWAL request to [employer] for TEZSPIRE  via [employer] portal. Will update once we receive a response.   (EOC) ID:  [id number]

## 2023-09-25 ENCOUNTER — Other Ambulatory Visit: Payer: Self-pay | Admitting: Nurse Practitioner

## 2023-10-03 DIAGNOSIS — Z13220 Encounter for screening for lipoid disorders: Secondary | ICD-10-CM | POA: Diagnosis not present

## 2023-10-03 DIAGNOSIS — Z0000 Encounter for general adult medical examination without abnormal findings: Secondary | ICD-10-CM | POA: Diagnosis not present

## 2023-10-14 DIAGNOSIS — Z23 Encounter for immunization: Secondary | ICD-10-CM | POA: Diagnosis not present

## 2023-10-14 DIAGNOSIS — J439 Emphysema, unspecified: Secondary | ICD-10-CM | POA: Diagnosis not present

## 2023-10-14 DIAGNOSIS — M810 Age-related osteoporosis without current pathological fracture: Secondary | ICD-10-CM | POA: Diagnosis not present

## 2023-10-14 DIAGNOSIS — L409 Psoriasis, unspecified: Secondary | ICD-10-CM | POA: Diagnosis not present

## 2023-10-14 DIAGNOSIS — C50919 Malignant neoplasm of unspecified site of unspecified female breast: Secondary | ICD-10-CM | POA: Diagnosis not present

## 2023-10-14 DIAGNOSIS — M1990 Unspecified osteoarthritis, unspecified site: Secondary | ICD-10-CM | POA: Diagnosis not present

## 2023-10-14 DIAGNOSIS — Z7185 Encounter for immunization safety counseling: Secondary | ICD-10-CM | POA: Diagnosis not present

## 2023-10-16 DIAGNOSIS — M3500 Sicca syndrome, unspecified: Secondary | ICD-10-CM | POA: Diagnosis not present

## 2023-10-16 DIAGNOSIS — R768 Other specified abnormal immunological findings in serum: Secondary | ICD-10-CM | POA: Diagnosis not present

## 2023-10-16 DIAGNOSIS — M19049 Primary osteoarthritis, unspecified hand: Secondary | ICD-10-CM | POA: Diagnosis not present

## 2023-10-16 DIAGNOSIS — L409 Psoriasis, unspecified: Secondary | ICD-10-CM | POA: Diagnosis not present

## 2023-10-20 DIAGNOSIS — L400 Psoriasis vulgaris: Secondary | ICD-10-CM | POA: Diagnosis not present

## 2023-10-22 ENCOUNTER — Encounter

## 2023-10-27 ENCOUNTER — Ambulatory Visit: Admitting: *Deleted

## 2023-10-27 DIAGNOSIS — J4489 Other specified chronic obstructive pulmonary disease: Secondary | ICD-10-CM | POA: Diagnosis not present

## 2023-10-27 LAB — PULMONARY FUNCTION TEST
DL/VA % pred: 46 %
DL/VA: 1.93 ml/min/mmHg/L
DLCO cor % pred: 35 %
DLCO cor: 6.76 ml/min/mmHg
DLCO unc % pred: 35 %
DLCO unc: 6.76 ml/min/mmHg
FEF 25-75 Post: 0.58 L/s
FEF 25-75 Pre: 0.66 L/s
FEF2575-%Change-Post: -11 %
FEF2575-%Pred-Post: 29 %
FEF2575-%Pred-Pre: 33 %
FEV1-%Change-Post: -2 %
FEV1-%Pred-Post: 61 %
FEV1-%Pred-Pre: 63 %
FEV1-Post: 1.41 L
FEV1-Pre: 1.44 L
FEV1FVC-%Change-Post: -2 %
FEV1FVC-%Pred-Pre: 73 %
FEV6-%Change-Post: -2 %
FEV6-%Pred-Post: 86 %
FEV6-%Pred-Pre: 88 %
FEV6-Post: 2.48 L
FEV6-Pre: 2.54 L
FEV6FVC-%Change-Post: -2 %
FEV6FVC-%Pred-Post: 100 %
FEV6FVC-%Pred-Pre: 102 %
FVC-%Change-Post: 0 %
FVC-%Pred-Post: 85 %
FVC-%Pred-Pre: 86 %
FVC-Post: 2.58 L
FVC-Pre: 2.59 L
Post FEV1/FVC ratio: 55 %
Post FEV6/FVC ratio: 96 %
Pre FEV1/FVC ratio: 56 %
Pre FEV6/FVC Ratio: 98 %
RV % pred: 97 %
RV: 2.08 L
TLC % pred: 91 %
TLC: 4.54 L

## 2023-10-27 NOTE — Patient Instructions (Signed)
 Full PFT performed today.

## 2023-10-27 NOTE — Progress Notes (Signed)
 Full PFT performed today.

## 2023-10-28 ENCOUNTER — Ambulatory Visit: Admitting: Adult Health

## 2023-10-28 ENCOUNTER — Encounter: Payer: Self-pay | Admitting: Adult Health

## 2023-10-28 ENCOUNTER — Other Ambulatory Visit: Payer: Self-pay | Admitting: Adult Health

## 2023-10-28 VITALS — BP 128/62 | HR 73 | Temp 97.9°F | Ht 64.0 in | Wt 176.0 lb

## 2023-10-28 DIAGNOSIS — J4489 Other specified chronic obstructive pulmonary disease: Secondary | ICD-10-CM

## 2023-10-28 DIAGNOSIS — J432 Centrilobular emphysema: Secondary | ICD-10-CM | POA: Diagnosis not present

## 2023-10-28 DIAGNOSIS — R911 Solitary pulmonary nodule: Secondary | ICD-10-CM | POA: Diagnosis not present

## 2023-10-28 DIAGNOSIS — J3089 Other allergic rhinitis: Secondary | ICD-10-CM

## 2023-10-28 MED ORDER — TEZSPIRE 210 MG/1.91ML ~~LOC~~ SOAJ: SUBCUTANEOUS | 11 refills | Status: AC

## 2023-10-28 NOTE — Progress Notes (Signed)
 @Patient  ID: Dorothy Frederick, female    DOB: Feb 27, 1955, 68 y.o.   MRN: [id number]  Chief Complaint  Patient presents with   Follow-up    PFT    Referring provider: Waylan Almarie SAUNDERS, MD  HPI: 68 yo female former smoker followed for COPD with asthma overlap syndrome, allergic rhinitis, lung nodules Medical history significant for left breast cancer s/p partial mastectomy-and radiation, recurrence s/p full mastectomy and chemo  Participates in the Lung Cancer CT chest screening program   TEST/EVENTS :  09/08/2020 CT chest: Right middle lobe nodular opacity, severe to moderate emphysema throughout the lung, centrilobular.     October CT chest: 2022: Right middle lobe nodular opacity resolved in comparison to previous images.   Severe centrilobular emphysema.     CT chest 01/04/2022 lung cancer screening CT: Lung RADS 2 benign behavior appearance, originally had a small nodule in the right middle lobe.   CT chest January 08, 2023 showed lung RADS 2 benign appearance, emphysema mild subpleural fibrotic changes in the left upper lobe, unchanged small scattered lung nodules   PFT 12/11/2020 FEV1 68%, ratio 60 , FVC 86%, DLCO 39%.    Discussed the use of AI scribe software for clinical note transcription with the patient, who gave verbal consent to proceed.  History of Present Illness Dorothy Frederick is a 68 year old female with COPD and asthma who presents for a six-month follow-up visit.  Breathing is stable, managed by staying indoors during hot weather. Uses Breztri  twice daily and Tezspire  once a month. Allergy  regimen includes Allegra , Singulair , and Flonase  as needed.  PFTs done yesterday showed stable lung function with FEV1 at 63%, ratio 56, FVC 86%, DLCO 35%. No recent exacerbations requiring prednisone  or hospitalization.  History of breast cancer with partial mastectomy and radiation in 2001, and full mastectomy with chemotherapy five years ago for recurrence.    Recently tested positive for rheumatoid arthritis and Sjogren's syndrome. Symptoms include stiff and weak hands, nodules, and fingers that get stuck in place. Awaiting referral to rheumatology. History of dry eyes and mouth, particularly during chemotherapy, managed with water and mouthwash.  Received flu and COVID vaccines last month. Had a pneumonia vaccine several years ago and a shingles vaccine about seven years ago. Wants to get the RSV vaccine.   Socially active and independent, continues to work for [employer]. Able to work from home when needed, allowing rest if tired.  Participates in the lung cancer CT screening program CT chest January 08, 2023 showed lung RADS 2 with stable emphysema and unchanged pulmonary nodules.  No suspicious nodules were noted   Allergies  Allergen Reactions   Benadryl  [Diphenhydramine  Hcl (Sleep)] Palpitations    Increased heartrate   Sulfa Antibiotics Rash    Immunization History  Administered Date(s) Administered   Influenza,inj,Quad PF,6+ Mos 11/05/2018   Influenza-Unspecified 10/21/2017, 10/11/2021   PFIZER Comirnaty(Gray Top)Covid-19 Tri-Sucrose Vaccine 10/22/2022   PFIZER(Purple Top)SARS-COV-2 Vaccination 11/03/2019, 05/15/2020    Past Medical History:  Diagnosis Date   Angina at rest    Anxiety    Cancer (HCC) 2000   left breast cancer-partial mastectomy   COPD (chronic obstructive pulmonary disease) (HCC)    Depression    Family history of breast cancer    Family history of esophageal cancer    Family history of lung cancer    Family history of prostate cancer    GERD (gastroesophageal reflux disease)    Hyperlipidemia    Hypertension    Hypothyroidism  Recurrent breast cancer, left (HCC)     Tobacco History: Social History   Tobacco Use  Smoking Status Former   Current packs/day: 0.00   Average packs/day: 1.5 packs/day for 42.0 years (63.0 ttl pk-yrs)   Types: Cigarettes   Start date: 3   Quit date: 01/21/2013    Years since quitting: 10.7  Smokeless Tobacco Never  Tobacco Comments   Smoked for 45 years - about 1-1.5ppd   Counseling given: Not Answered Tobacco comments: Smoked for 45 years - about 1-1.5ppd   Outpatient Medications Prior to Visit  Medication Sig Dispense Refill   albuterol  (PROVENTIL  HFA;VENTOLIN  HFA) 108 (90 BASE) MCG/ACT inhaler Inhale 2 puffs into the lungs every 6 (six) hours as needed for wheezing or shortness of breath.     atorvastatin  (LIPITOR) 40 MG tablet Take 40 mg by mouth daily.     Budeson-Glycopyrrol-Formoterol (BREZTRI  AEROSPHERE) 160-9-4.8 MCG/ACT AERO Inhale 2 puffs into the lungs 2 (two) times daily. 10.7 g 5   Cholecalciferol  125 MCG (5000 UT) TABS Take 5,000 Units by mouth daily.     escitalopram  (LEXAPRO ) 10 MG tablet Take 10 mg by mouth daily.  3   famotidine  (PEPCID ) 10 MG tablet Take 10 mg by mouth at bedtime.     fexofenadine  (ALLEGRA ) 180 MG tablet TAKE 1 TABLET BY MOUTH EVERY DAY 30 tablet 5   ibuprofen (ADVIL) 200 MG tablet Take 400 mg by mouth every 6 (six) hours as needed for headache or moderate pain.     levothyroxine  (SYNTHROID ) 75 MCG tablet Take 75 mcg by mouth See admin instructions. Alternate every other day with 50 mcg     montelukast  (SINGULAIR ) 10 MG tablet Take 1 tablet (10 mg total) by mouth at bedtime. 90 tablet 1   Multiple Vitamins-Minerals (WOMENS 50+ MULTI VITAMIN/MIN PO)      TEZSPIRE  210 MG/1. SOAJ INJECT 210 MG UNDER THE SKIN EVERY 28 DAYS 1.91 mL 3   fluticasone  (FLONASE ) 50 MCG/ACT nasal spray SPRAY 2 SPRAYS INTO EACH NOSTRIL EVERY DAY 48 mL 2   No facility-administered medications prior to visit.     Review of Systems:   Constitutional:   No  weight loss, night sweats,  Fevers, chills, +fatigue, or  lassitude.  HEENT:   No headaches,  Difficulty swallowing,  Tooth/dental problems, or  Sore throat,                No sneezing, itching, ear ache, nasal congestion, post nasal drip,   CV:  No chest pain,  Orthopnea,  PND, swelling in lower extremities, anasarca, dizziness, palpitations, syncope.   GI  No heartburn, indigestion, abdominal pain, nausea, vomiting, diarrhea, change in bowel habits, loss of appetite, bloody stools.   Resp: No chest wall deformity  Skin: no rash or lesions.  GU: no dysuria, change in color of urine, no urgency or frequency.  No flank pain, no hematuria   MS:  +joint stiffness     Physical Exam  BP 128/62   Pulse 73   Temp 97.9 F (36.6 C)   Ht 5' 4 (1.626 m)   Wt 176 lb (79.8 kg)   SpO2 99% Comment: RA  BMI 30.21 kg/m   GEN: A/Ox3; pleasant , NAD, well nourished    HEENT:  [place]/AT,  NOSE-clear, THROAT-clear, no lesions, no postnasal drip or exudate noted.   NECK:  Supple w/ fair ROM; no JVD; normal carotid impulses w/o bruits; no thyromegaly or nodules palpated; no lymphadenopathy.    RESP  Clear  P & A; w/o, wheezes/ rales/ or rhonchi. no accessory muscle use, no dullness to percussion  CARD:  RRR, no m/r/g, no peripheral edema, pulses intact, no cyanosis or clubbing.  GI:   Soft & nt; nml bowel sounds; no organomegaly or masses detected.   Musco: Warm bil, no deformities or joint swelling noted.   Neuro: alert, no focal deficits noted.    Skin: Warm, no lesions or rashes    Lab Results:   BMET   BNP No results found for: BNP  ProBNP No results found for: PROBNP  Imaging: No results found.  Administration History     None          Latest Ref Rng & Units 10/27/2023   10:37 AM 12/11/2020    8:58 AM  PFT Results  FVC-Pre L 2.59  P 2.65   FVC-Predicted Pre % 86  P 85   FVC-Post L 2.58  P 2.69   FVC-Predicted Post % 85  P 86   Pre FEV1/FVC % % 56  P 59   Post FEV1/FCV % % 55  P 60   FEV1-Pre L 1.44  P 1.55   FEV1-Predicted Pre % 63  P 65   FEV1-Post L 1.41  P 1.62   DLCO uncorrected ml/min/mmHg 6.76  P 7.62   DLCO UNC% % 35  P 39   DLCO corrected ml/min/mmHg 6.76  P 7.62   DLCO COR %Predicted % 35  P 39   DLVA  Predicted % 46  P 46   TLC L 4.54  P 5.01   TLC % Predicted % 91  P 100   RV % Predicted % 97  P 104     P Preliminary result    No results found for: NITRICOXIDE      Assessment & Plan:   Assessment and Plan Assessment & Plan Chronic obstructive pulmonary disease (COPD) with asthma and emphysema   COPD with asthma and emphysema is well-managed. Lung capacity remains at 63%, consistent with previous measurements. No recent exacerbations requiring prednisone  or hospitalization. Current treatment with Breztri  and Tezspire  effectively manages symptoms Continue Breztri  twice daily and Tezspire  monthly. Encourage 30 minutes of physical activity daily.   Pulmonary scarring of left lung secondary to radiation   Pulmonary scarring in the left upper lung is likely due to radiation therapy for breast cancer in 2001. Recent imaging shows no significant changes. No diffuse pulmonary fibrosis observed on CT scan.  Allergic rhinitis   Allergic rhinitis is well-controlled with Allegra , Singulair , and Flonase  as needed. Continue Allegra  and Singulair . Use Flonase  as needed.  Left breast cancer, status post mastectomy, chemotherapy, and radiation   Left breast cancer was treated with partial mastectomy, full mastectomy, chemotherapy, and radiation. No current issues related to breast cancer treatment.  Vaccinations   RSV vaccine is recommended due to high risk for decompensation from decreased lung capacity and multiple medical problems. Recommend RSV vaccine this fall.   Connective tissue/autoimmune serology positive for Sjogren's and rheumatoid factor-with associated joint stiffness.  Rheumatology referral/consult are pending.  Follow-up with rheumatology as planned  Plan  Patient Instructions  Continue on Breztri  2 puffs Twice daily, rinse after use.  Albuterol  inhaler or neb As needed   Continue on Tezspire  monthly.  Continue on Allegra , Singulair  daily  Flonase  daily As needed    Activity as tolerated.  Follow up with Dr. Kara or Miracle Mongillo NP in 6 months  Madelin Stank, NP 10/28/2023

## 2023-10-28 NOTE — Telephone Encounter (Signed)
 Pt requesting refill of specialty medication - routing to Rx team to advise.

## 2023-10-28 NOTE — Patient Instructions (Signed)
 Continue on Breztri  2 puffs Twice daily, rinse after use.  Albuterol  inhaler or neb As needed   Continue on Tezspire  monthly.  Continue on Allegra , Singulair  daily  Flonase  daily As needed   Activity as tolerated.  Follow up with Dr. Kara or Luisfernando Brightwell NP in 6 months

## 2023-10-28 NOTE — Telephone Encounter (Signed)
 Refill request received for Tezspire  - held off sending until after OV with APP today.   Refill was sent by Madelin Stank, NP.   Aleck Puls, PharmD, BCPS, CPP Clinical Pharmacist  [facility]

## 2023-12-01 DIAGNOSIS — M79641 Pain in right hand: Secondary | ICD-10-CM | POA: Diagnosis not present

## 2023-12-01 DIAGNOSIS — M3505 Sjogren syndrome with inflammatory arthritis: Secondary | ICD-10-CM | POA: Diagnosis not present

## 2023-12-01 DIAGNOSIS — M79642 Pain in left hand: Secondary | ICD-10-CM | POA: Diagnosis not present

## 2023-12-01 DIAGNOSIS — M059 Rheumatoid arthritis with rheumatoid factor, unspecified: Secondary | ICD-10-CM | POA: Diagnosis not present

## 2023-12-11 ENCOUNTER — Other Ambulatory Visit: Payer: Self-pay | Admitting: Adult Health

## 2023-12-30 DIAGNOSIS — R5383 Other fatigue: Secondary | ICD-10-CM | POA: Diagnosis not present

## 2023-12-30 DIAGNOSIS — E559 Vitamin D deficiency, unspecified: Secondary | ICD-10-CM | POA: Diagnosis not present

## 2023-12-30 DIAGNOSIS — E039 Hypothyroidism, unspecified: Secondary | ICD-10-CM | POA: Diagnosis not present

## 2024-01-02 DIAGNOSIS — E559 Vitamin D deficiency, unspecified: Secondary | ICD-10-CM | POA: Diagnosis not present

## 2024-01-02 DIAGNOSIS — I10 Essential (primary) hypertension: Secondary | ICD-10-CM | POA: Diagnosis not present

## 2024-01-02 DIAGNOSIS — Z79899 Other long term (current) drug therapy: Secondary | ICD-10-CM | POA: Diagnosis not present

## 2024-01-02 DIAGNOSIS — E039 Hypothyroidism, unspecified: Secondary | ICD-10-CM | POA: Diagnosis not present

## 2024-01-09 ENCOUNTER — Inpatient Hospital Stay: Admission: RE | Admit: 2024-01-09 | Discharge: 2024-01-09 | Attending: Acute Care | Admitting: Acute Care

## 2024-01-09 DIAGNOSIS — Z87891 Personal history of nicotine dependence: Secondary | ICD-10-CM

## 2024-01-09 DIAGNOSIS — Z122 Encounter for screening for malignant neoplasm of respiratory organs: Secondary | ICD-10-CM

## 2024-01-19 ENCOUNTER — Other Ambulatory Visit: Payer: Self-pay

## 2024-01-19 DIAGNOSIS — Z122 Encounter for screening for malignant neoplasm of respiratory organs: Secondary | ICD-10-CM

## 2024-01-19 DIAGNOSIS — Z87891 Personal history of nicotine dependence: Secondary | ICD-10-CM

## 2024-02-22 ENCOUNTER — Other Ambulatory Visit: Payer: Self-pay | Admitting: Adult Health

## 2024-02-22 DIAGNOSIS — J4489 Other specified chronic obstructive pulmonary disease: Secondary | ICD-10-CM
